# Patient Record
Sex: Female | Born: 1955 | Race: White | Hispanic: No | Marital: Single | Smoking: Never smoker
Health system: Southern US, Community
[De-identification: ages and names within clinical notes are randomized; demographics above are authoritative.]

## PROBLEM LIST (undated history)

## (undated) DIAGNOSIS — J45909 Unspecified asthma, uncomplicated: Secondary | ICD-10-CM

## (undated) DIAGNOSIS — F32A Depression, unspecified: Secondary | ICD-10-CM

## (undated) DIAGNOSIS — G43909 Migraine, unspecified, not intractable, without status migrainosus: Secondary | ICD-10-CM

## (undated) DIAGNOSIS — E785 Hyperlipidemia, unspecified: Secondary | ICD-10-CM

## (undated) DIAGNOSIS — N200 Calculus of kidney: Secondary | ICD-10-CM

## (undated) DIAGNOSIS — E119 Type 2 diabetes mellitus without complications: Secondary | ICD-10-CM

## (undated) DIAGNOSIS — T7840XA Allergy, unspecified, initial encounter: Secondary | ICD-10-CM

## (undated) DIAGNOSIS — F329 Major depressive disorder, single episode, unspecified: Secondary | ICD-10-CM

## (undated) HISTORY — DX: Type 2 diabetes mellitus without complications: E11.9

## (undated) HISTORY — DX: Migraine, unspecified, not intractable, without status migrainosus: G43.909

## (undated) HISTORY — PX: ABDOMINAL HYSTERECTOMY: SHX81

## (undated) HISTORY — DX: Unspecified asthma, uncomplicated: J45.909

## (undated) HISTORY — DX: Allergy, unspecified, initial encounter: T78.40XA

## (undated) HISTORY — DX: Hyperlipidemia, unspecified: E78.5

## (undated) HISTORY — PX: OOPHORECTOMY: SHX86

## (undated) HISTORY — PX: CHOLECYSTECTOMY: SHX55

## (undated) HISTORY — DX: Major depressive disorder, single episode, unspecified: F32.9

## (undated) HISTORY — DX: Depression, unspecified: F32.A

---

## 1979-04-23 HISTORY — PX: BREAST CYST ASPIRATION: SHX578

## 1982-04-22 HISTORY — PX: BREAST CYST ASPIRATION: SHX578

## 2000-04-11 ENCOUNTER — Encounter: Payer: Self-pay | Admitting: Cardiology

## 2000-04-11 ENCOUNTER — Ambulatory Visit (HOSPITAL_COMMUNITY): Admission: RE | Admit: 2000-04-11 | Discharge: 2000-04-11 | Payer: Self-pay | Admitting: Cardiology

## 2000-07-02 ENCOUNTER — Encounter: Payer: Self-pay | Admitting: Cardiology

## 2000-07-02 ENCOUNTER — Ambulatory Visit (HOSPITAL_COMMUNITY): Admission: RE | Admit: 2000-07-02 | Discharge: 2000-07-02 | Payer: Self-pay | Admitting: Cardiology

## 2000-07-04 ENCOUNTER — Ambulatory Visit (HOSPITAL_COMMUNITY): Admission: RE | Admit: 2000-07-04 | Discharge: 2000-07-04 | Payer: Self-pay | Admitting: Gastroenterology

## 2000-07-04 ENCOUNTER — Encounter: Payer: Self-pay | Admitting: Gastroenterology

## 2000-07-10 ENCOUNTER — Encounter: Payer: Self-pay | Admitting: General Surgery

## 2000-07-11 ENCOUNTER — Ambulatory Visit (HOSPITAL_COMMUNITY): Admission: RE | Admit: 2000-07-11 | Discharge: 2000-07-12 | Payer: Self-pay | Admitting: General Surgery

## 2001-01-21 ENCOUNTER — Ambulatory Visit (HOSPITAL_COMMUNITY): Admission: RE | Admit: 2001-01-21 | Discharge: 2001-01-21 | Payer: Self-pay | Admitting: Cardiology

## 2001-01-21 ENCOUNTER — Encounter: Payer: Self-pay | Admitting: Cardiology

## 2001-01-26 ENCOUNTER — Encounter: Payer: Self-pay | Admitting: *Deleted

## 2001-01-26 ENCOUNTER — Encounter: Admission: RE | Admit: 2001-01-26 | Discharge: 2001-01-26 | Payer: Self-pay | Admitting: *Deleted

## 2001-02-26 ENCOUNTER — Encounter: Admission: RE | Admit: 2001-02-26 | Discharge: 2001-02-26 | Payer: Self-pay | Admitting: *Deleted

## 2001-02-26 ENCOUNTER — Encounter: Payer: Self-pay | Admitting: *Deleted

## 2004-10-17 ENCOUNTER — Inpatient Hospital Stay: Payer: Self-pay | Admitting: Internal Medicine

## 2005-02-01 ENCOUNTER — Ambulatory Visit: Payer: Self-pay | Admitting: General Surgery

## 2005-02-13 ENCOUNTER — Ambulatory Visit: Payer: Self-pay | Admitting: General Surgery

## 2005-04-21 ENCOUNTER — Emergency Department: Payer: Self-pay | Admitting: Emergency Medicine

## 2005-10-09 ENCOUNTER — Emergency Department: Payer: Self-pay | Admitting: Emergency Medicine

## 2007-07-17 ENCOUNTER — Emergency Department: Payer: Self-pay | Admitting: Emergency Medicine

## 2009-06-06 ENCOUNTER — Ambulatory Visit: Payer: Self-pay | Admitting: Family Medicine

## 2011-04-20 ENCOUNTER — Emergency Department: Payer: Self-pay | Admitting: Emergency Medicine

## 2011-07-25 ENCOUNTER — Ambulatory Visit: Payer: Self-pay | Admitting: Family Medicine

## 2013-01-16 ENCOUNTER — Emergency Department: Payer: Self-pay | Admitting: Emergency Medicine

## 2013-07-27 ENCOUNTER — Ambulatory Visit: Payer: Self-pay | Admitting: Family Medicine

## 2013-08-30 ENCOUNTER — Emergency Department: Payer: Self-pay | Admitting: Emergency Medicine

## 2013-08-30 LAB — BASIC METABOLIC PANEL
Anion Gap: 3 — ABNORMAL LOW (ref 7–16)
BUN: 12 mg/dL (ref 7–18)
Calcium, Total: 9.1 mg/dL (ref 8.5–10.1)
Chloride: 103 mmol/L (ref 98–107)
Co2: 32 mmol/L (ref 21–32)
Creatinine: 0.64 mg/dL (ref 0.60–1.30)
EGFR (African American): >60
EGFR (Non-African Amer.): >60
Glucose: 113 mg/dL — ABNORMAL HIGH (ref 65–99)
Osmolality: 276 (ref 275–301)
POTASSIUM: 3.6 mmol/L (ref 3.5–5.1)
Sodium: 138 mmol/L (ref 136–145)

## 2013-08-30 LAB — CBC
HCT: 40.8 % (ref 35.0–47.0)
HGB: 13.4 g/dL (ref 12.0–16.0)
MCH: 30.3 pg (ref 26.0–34.0)
MCHC: 32.9 g/dL (ref 32.0–36.0)
MCV: 92 fL (ref 80–100)
Platelet: 238 10*3/uL (ref 150–440)
RBC: 4.43 10*6/uL (ref 3.80–5.20)
RDW: 12.9 % (ref 11.5–14.5)
WBC: 8.2 10*3/uL (ref 3.6–11.0)

## 2013-08-30 LAB — TROPONIN I

## 2013-10-25 ENCOUNTER — Ambulatory Visit: Payer: Self-pay | Admitting: Family Medicine

## 2014-06-21 HISTORY — PX: COLONOSCOPY: SHX174

## 2014-07-04 DIAGNOSIS — R1011 Right upper quadrant pain: Secondary | ICD-10-CM | POA: Insufficient documentation

## 2014-07-06 ENCOUNTER — Ambulatory Visit: Payer: Self-pay | Admitting: Unknown Physician Specialty

## 2014-07-25 ENCOUNTER — Ambulatory Visit
Admit: 2014-07-25 | Disposition: A | Discharge status: Home / Self Care | Payer: Self-pay | Attending: Unknown Physician Specialty | Admitting: Unknown Physician Specialty

## 2014-07-25 LAB — HM COLONOSCOPY

## 2014-08-15 LAB — SURGICAL PATHOLOGY

## 2014-08-31 DIAGNOSIS — K760 Fatty (change of) liver, not elsewhere classified: Secondary | ICD-10-CM | POA: Insufficient documentation

## 2014-10-19 ENCOUNTER — Other Ambulatory Visit: Payer: Self-pay | Admitting: Family Medicine

## 2014-10-20 ENCOUNTER — Ambulatory Visit (INDEPENDENT_AMBULATORY_CARE_PROVIDER_SITE_OTHER): Payer: Managed Care, Other (non HMO) | Admitting: Family Medicine

## 2014-10-20 ENCOUNTER — Encounter: Payer: Self-pay | Admitting: Family Medicine

## 2014-10-20 VITALS — BP 137/74 | HR 60 | Temp 98.0°F | Ht <= 58 in | Wt 192.0 lb

## 2014-10-20 DIAGNOSIS — Z6841 Body Mass Index (BMI) 40.0 and over, adult: Secondary | ICD-10-CM | POA: Insufficient documentation

## 2014-10-20 DIAGNOSIS — M79605 Pain in left leg: Secondary | ICD-10-CM | POA: Insufficient documentation

## 2014-10-20 DIAGNOSIS — E1169 Type 2 diabetes mellitus with other specified complication: Secondary | ICD-10-CM | POA: Insufficient documentation

## 2014-10-20 DIAGNOSIS — F32A Depression, unspecified: Secondary | ICD-10-CM

## 2014-10-20 DIAGNOSIS — M79604 Pain in right leg: Secondary | ICD-10-CM | POA: Diagnosis not present

## 2014-10-20 DIAGNOSIS — I10 Essential (primary) hypertension: Secondary | ICD-10-CM | POA: Insufficient documentation

## 2014-10-20 DIAGNOSIS — F329 Major depressive disorder, single episode, unspecified: Secondary | ICD-10-CM | POA: Diagnosis not present

## 2014-10-20 DIAGNOSIS — E119 Type 2 diabetes mellitus without complications: Secondary | ICD-10-CM

## 2014-10-20 DIAGNOSIS — K900 Celiac disease: Secondary | ICD-10-CM

## 2014-10-20 DIAGNOSIS — F339 Major depressive disorder, recurrent, unspecified: Secondary | ICD-10-CM | POA: Insufficient documentation

## 2014-10-20 LAB — BAYER DCA HB A1C WAIVED: HB A1C: 5.8 % (ref <7.0)

## 2014-10-20 MED ORDER — CYCLOBENZAPRINE HCL 10 MG PO TABS: 10.0000 mg | ORAL_TABLET | Freq: Every day | ORAL | Status: DC

## 2014-10-20 MED ORDER — LIRAGLUTIDE -WEIGHT MANAGEMENT 18 MG/3ML ~~LOC~~ SOPN
0.6000 | PEN_INJECTOR | Freq: Every day | SUBCUTANEOUS | Status: DC
Start: 2014-10-20 — End: 2015-04-13

## 2014-10-20 MED ORDER — BUPROPION HCL ER (SR) 150 MG PO TB12: 150.0000 mg | ORAL_TABLET | Freq: Three times a day (TID) | ORAL | Status: DC

## 2014-10-20 MED ORDER — DIAZEPAM 5 MG PO TABS: 5.0000 mg | ORAL_TABLET | Freq: Every day | ORAL | Status: DC | PRN

## 2014-10-20 MED ORDER — OMEPRAZOLE 20 MG PO CPDR: 20.0000 mg | DELAYED_RELEASE_CAPSULE | Freq: Every day | ORAL | Status: DC

## 2014-10-20 MED ORDER — BUDESONIDE-FORMOTEROL FUMARATE 160-4.5 MCG/ACT IN AERO: 2.0000 | INHALATION_SPRAY | Freq: Two times a day (BID) | RESPIRATORY_TRACT | Status: DC

## 2014-10-20 MED ORDER — HYDROCHLOROTHIAZIDE 25 MG PO TABS: 25.0000 mg | ORAL_TABLET | Freq: Every day | ORAL | Status: DC

## 2014-10-20 MED ORDER — GABAPENTIN 300 MG PO CAPS: 300.0000 mg | ORAL_CAPSULE | Freq: Two times a day (BID) | ORAL | Status: DC

## 2014-10-20 MED ORDER — MIRABEGRON ER 25 MG PO TB24: 25.0000 mg | ORAL_TABLET | Freq: Every day | ORAL | Status: DC

## 2014-10-20 NOTE — Progress Notes (Signed)
BP 137/74 mmHg  Pulse 60  Temp(Src) 98 F (36.7 C)  Ht 4\' 10"  (1.473 m)  Wt 192 lb (87.091 kg)  BMI 40.14 kg/m2  SpO2 99%   Subjective:    Patient ID: [name], female    DOB: 07/27/1955, 59 y.o.   MRN: [id number]  HPI: [name] is a 59 y.o. female  Chief Complaint  Patient presents with  . Diabetes  htn  Doing well Was dx with celiac and doing OK but not gaining wt meds stable takes every day long term No side effects Still chronic leg pain wants to try something will try gabapentin Still a lot of Dad issues and takes 1/2 valium PRN   Relevant past medical, surgical, family and social history reviewed and updated as indicated. Interim medical history since our last visit reviewed. Allergies and medications reviewed and updated.  Review of Systems  Constitutional: Negative.   Respiratory: Negative.   Cardiovascular: Negative.     Per HPI unless specifically indicated above     Objective:    BP 137/74 mmHg  Pulse 60  Temp(Src) 98 F (36.7 C)  Ht 4\' 10"  (1.473 m)  Wt 192 lb (87.091 kg)  BMI 40.14 kg/m2  SpO2 99%  Wt Readings from Last 3 Encounters:  10/20/14 192 lb (87.091 kg)  06/16/14 193 lb (87.544 kg)    Physical Exam  Constitutional: She is oriented to person, place, and time. She appears well-developed and well-nourished. No distress.  HENT:  Head: Normocephalic and atraumatic.  Right Ear: Hearing normal.  Left Ear: Hearing normal.  Nose: Nose normal.  Eyes: Conjunctivae and lids are normal. Right eye exhibits no discharge. Left eye exhibits no discharge. No scleral icterus.  Cardiovascular: Normal rate, regular rhythm and normal heart sounds.   Pulmonary/Chest: Effort normal and breath sounds normal. No respiratory distress.  Musculoskeletal: Normal range of motion.  Neurological: She is alert and oriented to person, place, and time.  Skin: Skin is intact. No rash noted.  Psychiatric: She has a normal mood and  affect. Her speech is normal and behavior is normal. Judgment and thought content normal. Cognition and memory are normal.        Assessment & Plan:   Problem List Items Addressed This Visit      Cardiovascular and Mediastinum   Hypertension - Primary    The current medical regimen is effective;  continue present plan and medications.       Relevant Medications   hydrochlorothiazide (HYDRODIURIL) 25 MG tablet   Other Relevant Orders   Basic metabolic panel     Digestive   Celiac sprue    On diet and followed by GI      Relevant Medications   omeprazole (PRILOSEC) 20 MG capsule     Endocrine   Diabetes mellitus without complication   Relevant Orders   Bayer DCA Hb A1c Waived     Other   Depression    Cont PRN valium      Relevant Medications   buPROPion (WELLBUTRIN SR) 150 MG 12 hr tablet   diazepam (VALIUM) 5 MG tablet   BMI 40.0-44.9, adult    Discuss wt loss diet exercise Will try saxenda again had constipation last time but no glutin free      Relevant Medications   Liraglutide -Weight Management (SAXENDA) 18 MG/3ML SOPN   Leg pain, bilateral    Will try gabapentin      Relevant Medications   gabapentin (NEURONTIN)  300 MG capsule       Follow up plan: Return in about 6 months (around 04/21/2015) for Physical Exam.

## 2014-10-20 NOTE — Assessment & Plan Note (Signed)
Will try gabapentin 

## 2014-10-20 NOTE — Assessment & Plan Note (Signed)
Cont PRN valium

## 2014-10-20 NOTE — Assessment & Plan Note (Signed)
On diet and followed by GI

## 2014-10-20 NOTE — Assessment & Plan Note (Signed)
Discuss wt loss diet exercise Will try saxenda again had constipation last time but no glutin free

## 2014-10-20 NOTE — Assessment & Plan Note (Signed)
The current medical regimen is effective;  continue present plan and medications.  

## 2014-10-21 LAB — BASIC METABOLIC PANEL
BUN/Creatinine Ratio: 28 — ABNORMAL HIGH (ref 9–23)
BUN: 17 mg/dL (ref 6–24)
CALCIUM: 9.5 mg/dL (ref 8.7–10.2)
CHLORIDE: 100 mmol/L (ref 97–108)
CO2: 25 mmol/L (ref 18–29)
Creatinine, Ser: 0.61 mg/dL (ref 0.57–1.00)
GFR, EST AFRICAN AMERICAN: 115 mL/min/{1.73_m2} (ref >59)
GFR, EST NON AFRICAN AMERICAN: 100 mL/min/{1.73_m2} (ref >59)
Glucose: 105 mg/dL — ABNORMAL HIGH (ref 65–99)
Potassium: 4.2 mmol/L (ref 3.5–5.2)
SODIUM: 142 mmol/L (ref 134–144)

## 2014-11-01 ENCOUNTER — Telehealth: Payer: Self-pay

## 2014-11-01 NOTE — Telephone Encounter (Signed)
Patient states her legs are still hurting and the Gabapentin makes her too drowsy.  She would like to see if there is something different she can take.  Uses [facility]

## 2014-11-02 ENCOUNTER — Telehealth: Payer: Self-pay | Admitting: Family Medicine

## 2014-11-02 MED ORDER — PREGABALIN 50 MG PO CAPS: 50.0000 mg | ORAL_CAPSULE | Freq: Three times a day (TID) | ORAL | Status: DC

## 2014-11-02 NOTE — Telephone Encounter (Signed)
Pt called wanted to be transferred to vm for nancy and i told her that we couldn't do that anymore. She wouldn't tell me what she needed or anything all she just asked that you call her back.

## 2014-11-03 NOTE — Telephone Encounter (Signed)
Rx was written for Lyrica but mistakenly printed, has now been called into [facility]

## 2014-11-25 ENCOUNTER — Other Ambulatory Visit: Payer: Self-pay | Admitting: Family Medicine

## 2014-11-25 DIAGNOSIS — Z1231 Encounter for screening mammogram for malignant neoplasm of breast: Secondary | ICD-10-CM

## 2014-11-29 ENCOUNTER — Ambulatory Visit
Admission: RE | Admit: 2014-11-29 | Discharge: 2014-11-29 | Disposition: A | Discharge status: Home / Self Care | Payer: Managed Care, Other (non HMO) | Source: Ambulatory Visit | Attending: Family Medicine | Admitting: Family Medicine

## 2014-11-29 DIAGNOSIS — Z1231 Encounter for screening mammogram for malignant neoplasm of breast: Secondary | ICD-10-CM | POA: Insufficient documentation

## 2014-12-15 ENCOUNTER — Telehealth: Payer: Self-pay | Admitting: Family Medicine

## 2014-12-15 MED ORDER — CIPROFLOXACIN HCL 250 MG PO TABS: 250.0000 mg | ORAL_TABLET | Freq: Two times a day (BID) | ORAL | Status: DC

## 2014-12-15 NOTE — Telephone Encounter (Signed)
Pt called stated she thinks she may have a uti or bladder infection wants to know if something can be called in for her. Pharm is [facility] in [place]. Thanks.

## 2014-12-15 NOTE — Telephone Encounter (Signed)
Phone call Patient with UTI will call in medications will need office visit if not better

## 2015-02-23 ENCOUNTER — Encounter: Payer: Self-pay | Admitting: Emergency Medicine

## 2015-02-23 ENCOUNTER — Emergency Department
Admission: EM | Admit: 2015-02-23 | Discharge: 2015-02-23 | Disposition: A | Discharge status: Home / Self Care | Payer: Managed Care, Other (non HMO) | Attending: Emergency Medicine | Admitting: Emergency Medicine

## 2015-02-23 ENCOUNTER — Emergency Department: Payer: Managed Care, Other (non HMO)

## 2015-02-23 DIAGNOSIS — Z880 Allergy status to penicillin: Secondary | ICD-10-CM | POA: Insufficient documentation

## 2015-02-23 DIAGNOSIS — Z792 Long term (current) use of antibiotics: Secondary | ICD-10-CM | POA: Insufficient documentation

## 2015-02-23 DIAGNOSIS — Z79899 Other long term (current) drug therapy: Secondary | ICD-10-CM | POA: Diagnosis not present

## 2015-02-23 DIAGNOSIS — N390 Urinary tract infection, site not specified: Secondary | ICD-10-CM

## 2015-02-23 DIAGNOSIS — I10 Essential (primary) hypertension: Secondary | ICD-10-CM | POA: Insufficient documentation

## 2015-02-23 DIAGNOSIS — E119 Type 2 diabetes mellitus without complications: Secondary | ICD-10-CM | POA: Insufficient documentation

## 2015-02-23 DIAGNOSIS — Z7951 Long term (current) use of inhaled steroids: Secondary | ICD-10-CM | POA: Insufficient documentation

## 2015-02-23 DIAGNOSIS — R109 Unspecified abdominal pain: Secondary | ICD-10-CM

## 2015-02-23 DIAGNOSIS — R1032 Left lower quadrant pain: Secondary | ICD-10-CM | POA: Diagnosis present

## 2015-02-23 HISTORY — DX: Calculus of kidney: N20.0

## 2015-02-23 LAB — URINALYSIS COMPLETE WITH MICROSCOPIC (ARMC ONLY)
BACTERIA UA: NONE SEEN
Bilirubin Urine: NEGATIVE
GLUCOSE, UA: NEGATIVE mg/dL
KETONES UR: NEGATIVE mg/dL
NITRITE: NEGATIVE
PROTEIN: 30 mg/dL — AB
SPECIFIC GRAVITY, URINE: 1.019 (ref 1.005–1.030)
pH: 5 (ref 5.0–8.0)

## 2015-02-23 LAB — COMPREHENSIVE METABOLIC PANEL
ALT: 21 U/L (ref 14–54)
AST: 19 U/L (ref 15–41)
Albumin: 4.4 g/dL (ref 3.5–5.0)
Alkaline Phosphatase: 61 U/L (ref 38–126)
Anion gap: 8 (ref 5–15)
BUN: 14 mg/dL (ref 6–20)
CHLORIDE: 103 mmol/L (ref 101–111)
CO2: 28 mmol/L (ref 22–32)
CREATININE: 0.75 mg/dL (ref 0.44–1.00)
Calcium: 9.3 mg/dL (ref 8.9–10.3)
GFR calc non Af Amer: >60 mL/min (ref >60)
Glucose, Bld: 126 mg/dL — ABNORMAL HIGH (ref 65–99)
POTASSIUM: 3.8 mmol/L (ref 3.5–5.1)
SODIUM: 139 mmol/L (ref 135–145)
Total Bilirubin: 0.8 mg/dL (ref 0.3–1.2)
Total Protein: 7.6 g/dL (ref 6.5–8.1)

## 2015-02-23 LAB — CBC
HEMATOCRIT: 40.7 % (ref 35.0–47.0)
Hemoglobin: 13.7 g/dL (ref 12.0–16.0)
MCH: 30.8 pg (ref 26.0–34.0)
MCHC: 33.8 g/dL (ref 32.0–36.0)
MCV: 91.3 fL (ref 80.0–100.0)
PLATELETS: 251 10*3/uL (ref 150–440)
RBC: 4.45 MIL/uL (ref 3.80–5.20)
RDW: 13 % (ref 11.5–14.5)
WBC: 12.3 10*3/uL — AB (ref 3.6–11.0)

## 2015-02-23 LAB — LIPASE, BLOOD: LIPASE: 32 U/L (ref 11–51)

## 2015-02-23 MED ORDER — CEPHALEXIN 500 MG PO CAPS
ORAL_CAPSULE | ORAL | Status: DC
Start: 2015-02-23 — End: 2015-02-24
  Filled 2015-02-23: qty 1

## 2015-02-23 MED ORDER — OXYCODONE-ACETAMINOPHEN 5-325 MG PO TABS: 1.0000 | ORAL_TABLET | Freq: Four times a day (QID) | ORAL | Status: DC | PRN

## 2015-02-23 MED ORDER — CEPHALEXIN 500 MG PO CAPS: 500.0000 mg | ORAL_CAPSULE | Freq: Three times a day (TID) | ORAL | Status: DC

## 2015-02-23 MED ORDER — KETOROLAC TROMETHAMINE 60 MG/2ML IM SOLN
INTRAMUSCULAR | Status: AC
  Filled 2015-02-23: qty 2

## 2015-02-23 MED ORDER — KETOROLAC TROMETHAMINE 30 MG/ML IJ SOLN
30.0000 mg | Freq: Once | INTRAMUSCULAR | Status: AC
  Administered 2015-02-23: 30 mg via INTRAVENOUS

## 2015-02-23 MED ORDER — ONDANSETRON HCL 4 MG/2ML IJ SOLN
4.0000 mg | Freq: Once | INTRAMUSCULAR | Status: AC
  Administered 2015-02-23: 4 mg via INTRAVENOUS
  Filled 2015-02-23: qty 2

## 2015-02-23 MED ORDER — SODIUM CHLORIDE 0.9 % IV BOLUS (SEPSIS)
1000.0000 mL | Freq: Once | INTRAVENOUS | Status: AC
  Administered 2015-02-23: 1000 mL via INTRAVENOUS

## 2015-02-23 MED ORDER — CEPHALEXIN 500 MG PO CAPS
500.0000 mg | ORAL_CAPSULE | Freq: Once | ORAL | Status: AC
  Administered 2015-02-23: 500 mg via ORAL

## 2015-02-23 MED ORDER — MORPHINE SULFATE (PF) 4 MG/ML IV SOLN
4.0000 mg | Freq: Once | INTRAVENOUS | Status: AC
  Administered 2015-02-23: 4 mg via INTRAVENOUS
  Filled 2015-02-23: qty 1

## 2015-02-23 NOTE — Discharge Instructions (Signed)
Abdominal Pain, Adult °Many things can cause abdominal pain. Usually, abdominal pain is not caused by a disease and will improve without treatment. It can often be observed and treated at home. Your health care provider will do a physical exam and possibly order blood tests and X-rays to help determine the seriousness of your pain. However, in many cases, more time must pass before a clear cause of the pain can be found. Before that point, your health care provider may not know if you need more testing or further treatment. °HOME CARE INSTRUCTIONS °Monitor your abdominal pain for any changes. The following actions may help to alleviate any discomfort you are experiencing: °· Only take over-the-counter or prescription medicines as directed by your health care provider. °· Do not take laxatives unless directed to do so by your health care provider. °· Try a clear liquid diet (broth, tea, or water) as directed by your health care provider. Slowly move to a bland diet as tolerated. °SEEK MEDICAL CARE IF: °· You have unexplained abdominal pain. °· You have abdominal pain associated with nausea or diarrhea. °· You have pain when you urinate or have a bowel movement. °· You experience abdominal pain that wakes you in the night. °· You have abdominal pain that is worsened or improved by eating food. °· You have abdominal pain that is worsened with eating fatty foods. °· You have a fever. °SEEK IMMEDIATE MEDICAL CARE IF: °· Your pain does not go away within 2 hours. °· You keep throwing up (vomiting). °· Your pain is felt only in portions of the abdomen, such as the right side or the left lower portion of the abdomen. °· You pass bloody or black tarry stools. °MAKE SURE YOU: °· Understand these instructions. °· Will watch your condition. °· Will get help right away if you are not doing well or get worse. °  °This information is not intended to replace advice given to you by your health care provider. Make sure you discuss  any questions you have with your health care provider. °  °Document Released: 01/16/2005 Document Revised: 12/28/2014 Document Reviewed: 12/16/2012 °Elsevier Interactive Patient Education ©2016 [employer] ° °Urinary Tract Infection °A urinary tract infection (UTI) can occur any place along the urinary tract. The tract includes the kidneys, ureters, bladder, and urethra. A type of germ called bacteria often causes a UTI. UTIs are often helped with antibiotic medicine.  °HOME CARE  °· If given, take antibiotics as told by your doctor. Finish them even if you start to feel better. °· Drink enough fluids to keep your pee (urine) clear or pale yellow. °· Avoid tea, drinks with caffeine, and bubbly (carbonated) drinks. °· Pee often. Avoid holding your pee in for a long time. °· Pee before and after having sex (intercourse). °· Wipe from front to back after you poop (bowel movement) if you are a woman. Use each tissue only once. °GET HELP RIGHT AWAY IF:  °· You have back pain. °· You have lower belly (abdominal) pain. °· You have chills. °· You feel sick to your stomach (nauseous). °· You throw up (vomit). °· Your burning or discomfort with peeing does not go away. °· You have a fever. °· Your symptoms are not better in 3 days. °MAKE SURE YOU:  °· Understand these instructions. °· Will watch your condition. °· Will get help right away if you are not doing well or get worse. °  °This information is not intended to replace advice given   to you by your health care provider. Make sure you discuss any questions you have with your health care provider. °  °Document Released: 09/25/2007 Document Revised: 04/29/2014 Document Reviewed: 11/07/2011 °Elsevier Interactive Patient Education ©2016 [employer] ° °

## 2015-02-23 NOTE — ED Notes (Signed)
Right lower back pain onset 1530.  Pain moved toward right lower quadrant.  Patient also c/o hematuria and urinary urgency since 1530.

## 2015-02-23 NOTE — ED Provider Notes (Signed)
[facility] Emergency Department Provider Note  Time seen: 8:45 PM  I have reviewed the triage vital signs and the nursing notes.   HISTORY  Chief Complaint Hematuria and Flank Pain    HPI [name] is a 59 y.o. female with a past medical history of hypertension, hyperlipidemia, depression, diabetes, kidney stones who presents to the emergency department with left flank pain. According to the patient she developed left flank pain approximately 5-6 hours ago. States it is severe 10/10 radiating down to her left groin. States she has developed bloody urine as well. Denies dysuria. Denies fever. States one episode of diarrhea, nausea and one episode of vomiting. Describes the pain as 10/10 currently, sharp pain located in the left flank to left lower quadrant.     Past Medical History  Diagnosis Date  . Hyperlipidemia   . Depression   . Diabetes mellitus without complication (HCC)   . Asthma   . Migraine headache   . Kidney stones     Patient Active Problem List   Diagnosis Date Noted  . Hypertension 10/20/2014  . Diabetes mellitus without complication (HCC) 10/20/2014  . Celiac sprue 10/20/2014  . Depression 10/20/2014  . BMI 40.0-44.9, adult (HCC) 10/20/2014  . Leg pain, bilateral 10/20/2014    Past Surgical History  Procedure Laterality Date  . Abdominal hysterectomy    . Cholecystectomy    . Colonoscopy  March 2016  . Breast cyst aspiration Right     benign  . Breast cyst aspiration Right     benign    Current Outpatient Rx  Name  Route  Sig  Dispense  Refill  . albuterol (PROVENTIL HFA;VENTOLIN HFA) 108 (90 BASE) MCG/ACT inhaler   Inhalation   Inhale 2 puffs into the lungs every 6 (six) hours as needed for wheezing or shortness of breath.         . budesonide-formoterol (SYMBICORT) 160-4.5 MCG/ACT inhaler   Inhalation   Inhale 2 puffs into the lungs 2 (two) times daily.   1 Inhaler   7   . buPROPion (WELLBUTRIN SR)  150 MG 12 hr tablet   Oral   Take 1 tablet (150 mg total) by mouth 3 (three) times daily.   90 tablet   7   . ciprofloxacin (CIPRO) 250 MG tablet   Oral   Take 1 tablet (250 mg total) by mouth 2 (two) times daily.   6 tablet   0   . cyclobenzaprine (FLEXERIL) 10 MG tablet   Oral   Take 1 tablet (10 mg total) by mouth at bedtime.   30 tablet   3   . diazepam (VALIUM) 5 MG tablet   Oral   Take 1 tablet (5 mg total) by mouth daily as needed for anxiety.   30 tablet   3   . hydrochlorothiazide (HYDRODIURIL) 25 MG tablet   Oral   Take 1 tablet (25 mg total) by mouth daily.   30 tablet   7   . Liraglutide -Weight Management (SAXENDA) 18 MG/3ML SOPN   Subcutaneous   Inject 0.6 application into the skin daily. Start .6 a day for 1 week may increase by .6 q week max 3   3 pen   7   . mirabegron ER (MYRBETRIQ) 25 MG TB24 tablet   Oral   Take 1 tablet (25 mg total) by mouth daily.   30 tablet   6     FOR NEXT FILL. THANK YOU   .  omeprazole (PRILOSEC) 20 MG capsule   Oral   Take 1 capsule (20 mg total) by mouth daily.   30 capsule   12   . pregabalin (LYRICA) 50 MG capsule   Oral   Take 1 capsule (50 mg total) by mouth 3 (three) times daily. If needed may increase to  tid after 1 week   90 capsule   1     Allergies Amoxicillin  Family History  Problem Relation Age of Onset  . Hypertension Mother   . Cancer Mother     breast  . Arthritis Mother   . Alzheimer's disease Mother   . Parkinson's disease Mother   . Breast cancer Mother 59  . Hypertension Father   . Diabetes Father   . Breast cancer Maternal Aunt   . Breast cancer Maternal Grandmother 35  . Breast cancer Cousin 16    Social History Social History  Substance Use Topics  . Smoking status: Never Smoker   . Smokeless tobacco: Never Used  . Alcohol Use: Yes    Review of Systems Constitutional: Negative for fever Cardiovascular: Negative for chest pain. Respiratory: Negative for  shortness of breath. Gastrointestinal: Left flank pain, nausea and vomiting. Genitourinary: Negative for dysuria. Positive for hematuria Musculoskeletal: Positive left back pain. 10-point ROS otherwise negative.  ____________________________________________   PHYSICAL EXAM:  VITAL SIGNS: ED Triage Vitals  Enc Vitals Group     BP 02/23/15 1932 171/78 mmHg     Pulse Rate 02/23/15 1932 71     Resp 02/23/15 1932 16     Temp 02/23/15 1932 97.7 F (36.5 C)     Temp Source 02/23/15 1932 Oral     SpO2 02/23/15 1932 96 %     Weight 02/23/15 1930 180 lb (81.647 kg)     Height 02/23/15 1930 5' (1.524 m)     Head Cir --      Peak Flow --      Pain Score 02/23/15 1931 10     Pain Loc --      Pain Edu? --      Excl. in GC? --    Constitutional: Alert and oriented. Well appearing and in no distress. Eyes: Normal exam ENT   Head: Normocephalic and atraumatic.   Mouth/Throat: Mucous membranes are moist. Cardiovascular: Normal rate, regular rhythm. No murmurs, rubs, or gallops. Respiratory: Normal respiratory effort without tachypnea nor retractions. Breath sounds are clear and equal bilaterally. No wheezes/rales/rhonchi. Gastrointestinal: Mild left lower quadrant tenderness palpation. No distention, no rebound or guarding. Musculoskeletal: Nontender with normal range of motion in all extremities.  Neurologic:  Normal speech and language. No gross focal neurologic deficits  Skin:  Skin is warm, dry and intact.  Psychiatric: Mood and affect are normal. Speech and behavior are normal.   ____________________________________________    RADIOLOGY  CT shows no calculi.  ____________________________________________    INITIAL IMPRESSION / ASSESSMENT AND PLAN / ED COURSE  Pertinent labs & imaging results that were available during my care of the patient were reviewed by me and considered in my medical decision making (see chart for details).  Patient presents the emergency  department with 6 hours of left flank pain. History of kidney stones in the past which this feels similar. We will check labs, CT renal scan of the left flank, and treat discomfort while awaiting results.  CT shows no renal stones. Given red and white blood cells in her urine, we will treat with an antibiotic for her likely  urinary tract infection. We'll also discharge her short course of pain medication. Patient is follow up with the primary care physician 1-2 days for recheck. I discussed strict return precautions for any worsening pain, fever, patient agreeable.  ____________________________________________   FINAL CLINICAL IMPRESSION(S) / ED DIAGNOSES  Left flank pain Urinary tract infection  Minna Antis, MD 02/23/15 2222

## 2015-02-25 LAB — URINE CULTURE

## 2015-03-29 ENCOUNTER — Other Ambulatory Visit
Admission: RE | Admit: 2015-03-29 | Discharge: 2015-03-29 | Disposition: A | Discharge status: Home / Self Care | Payer: Managed Care, Other (non HMO) | Source: Skilled Nursing Facility | Attending: Physician Assistant | Admitting: Physician Assistant

## 2015-03-29 DIAGNOSIS — R197 Diarrhea, unspecified: Secondary | ICD-10-CM | POA: Insufficient documentation

## 2015-03-29 LAB — C DIFFICILE QUICK SCREEN W PCR REFLEX
C DIFFICLE (CDIFF) ANTIGEN: NEGATIVE
C Diff interpretation: NEGATIVE
C Diff toxin: NEGATIVE

## 2015-04-02 LAB — STOOL CULTURE

## 2015-04-04 LAB — O&P RESULT

## 2015-04-04 LAB — GIARDIA, EIA; OVA/PARASITE: Giardia Ag, Stl: NEGATIVE

## 2015-04-04 LAB — PANCREATIC ELASTASE, FECAL: Pancreatic Elastase-1, Stool: >500 ug Elast./g (ref >200)

## 2015-04-13 ENCOUNTER — Ambulatory Visit (INDEPENDENT_AMBULATORY_CARE_PROVIDER_SITE_OTHER): Payer: Managed Care, Other (non HMO) | Admitting: Family Medicine

## 2015-04-13 ENCOUNTER — Encounter: Payer: Self-pay | Admitting: Family Medicine

## 2015-04-13 VITALS — BP 117/75 | HR 62 | Temp 98.0°F | Ht 58.2 in | Wt 188.0 lb

## 2015-04-13 DIAGNOSIS — Z0000 Encounter for general adult medical examination without abnormal findings: Secondary | ICD-10-CM

## 2015-04-13 DIAGNOSIS — E119 Type 2 diabetes mellitus without complications: Secondary | ICD-10-CM | POA: Diagnosis not present

## 2015-04-13 DIAGNOSIS — K900 Celiac disease: Secondary | ICD-10-CM

## 2015-04-13 DIAGNOSIS — F32A Depression, unspecified: Secondary | ICD-10-CM

## 2015-04-13 DIAGNOSIS — I10 Essential (primary) hypertension: Secondary | ICD-10-CM

## 2015-04-13 DIAGNOSIS — F329 Major depressive disorder, single episode, unspecified: Secondary | ICD-10-CM | POA: Diagnosis not present

## 2015-04-13 LAB — MICROALBUMIN, URINE WAIVED
CREATININE, URINE WAIVED: 200 mg/dL (ref 10–300)
MICROALB, UR WAIVED: 80 mg/L — AB (ref 0–19)

## 2015-04-13 LAB — URINALYSIS, ROUTINE W REFLEX MICROSCOPIC
Bilirubin, UA: NEGATIVE
GLUCOSE, UA: NEGATIVE
KETONES UA: NEGATIVE
LEUKOCYTES UA: NEGATIVE
Nitrite, UA: NEGATIVE
PROTEIN UA: NEGATIVE
RBC, UA: NEGATIVE
Specific Gravity, UA: 1.015 (ref 1.005–1.030)
Urobilinogen, Ur: 0.2 mg/dL (ref 0.2–1.0)
pH, UA: 7.5 (ref 5.0–7.5)

## 2015-04-13 LAB — BAYER DCA HB A1C WAIVED: HB A1C: 5.8 % (ref <7.0)

## 2015-04-13 MED ORDER — MIRABEGRON ER 25 MG PO TB24: 25.0000 mg | ORAL_TABLET | Freq: Every day | ORAL | Status: DC

## 2015-04-13 MED ORDER — BUPROPION HCL ER (SR) 150 MG PO TB12: 150.0000 mg | ORAL_TABLET | Freq: Three times a day (TID) | ORAL | Status: DC

## 2015-04-13 MED ORDER — ALBUTEROL SULFATE HFA 108 (90 BASE) MCG/ACT IN AERS: 2.0000 | INHALATION_SPRAY | Freq: Four times a day (QID) | RESPIRATORY_TRACT | Status: DC | PRN

## 2015-04-13 MED ORDER — HYDROCHLOROTHIAZIDE 25 MG PO TABS: 25.0000 mg | ORAL_TABLET | Freq: Every day | ORAL | Status: DC

## 2015-04-13 MED ORDER — BUDESONIDE-FORMOTEROL FUMARATE 160-4.5 MCG/ACT IN AERO: 2.0000 | INHALATION_SPRAY | Freq: Two times a day (BID) | RESPIRATORY_TRACT | Status: DC

## 2015-04-13 MED ORDER — DIAZEPAM 5 MG PO TABS: 5.0000 mg | ORAL_TABLET | Freq: Every day | ORAL | Status: DC | PRN

## 2015-04-13 MED ORDER — OMEPRAZOLE 20 MG PO CPDR: 20.0000 mg | DELAYED_RELEASE_CAPSULE | Freq: Every day | ORAL | Status: DC

## 2015-04-13 NOTE — Assessment & Plan Note (Signed)
The current medical regimen is effective;  continue present plan and medications.  

## 2015-04-13 NOTE — Progress Notes (Signed)
BP 117/75 mmHg  Pulse 62  Temp(Src) 98 F (36.7 C)  Ht 4' 10.2" (1.478 m)  Wt 188 lb (85.276 kg)  BMI 39.04 kg/m2  SpO2 97%   Subjective:    Patient ID: [name], female    DOB: Nov 24, 1955, 59 y.o.   MRN: [id number]  HPI: [name] is a 59 y.o. female  Chief Complaint  Patient presents with  . Annual Exam   patient doing well all in all depression nerves stable with bupropion and occasional Valium use. Abdominal pains being controlled husband diagnoses irritable bowel Leg symptoms of stopped his modified exercise which is made a big difference Overactive bladder doing fair at best Breathing is stable. Relevant past medical, surgical, family and social history reviewed and updated as indicated. Interim medical history since our last visit reviewed. Allergies and medications reviewed and updated.  Review of Systems  Constitutional: Negative.   HENT: Negative.   Eyes: Negative.   Respiratory: Negative.   Cardiovascular: Negative.   Gastrointestinal: Negative.   Endocrine: Negative.   Genitourinary: Negative.   Musculoskeletal: Negative.   Skin: Negative.   Allergic/Immunologic: Negative.   Neurological: Negative.   Hematological: Negative.   Psychiatric/Behavioral: Negative.     Per HPI unless specifically indicated above     Objective:    BP 117/75 mmHg  Pulse 62  Temp(Src) 98 F (36.7 C)  Ht 4' 10.2" (1.478 m)  Wt 188 lb (85.276 kg)  BMI 39.04 kg/m2  SpO2 97%  Wt Readings from Last 3 Encounters:  04/13/15 188 lb (85.276 kg)  02/23/15 180 lb (81.647 kg)  10/20/14 192 lb (87.091 kg)    Physical Exam  Constitutional: She is oriented to person, place, and time. She appears well-developed and well-nourished.  HENT:  Head: Normocephalic and atraumatic.  Right Ear: External ear normal.  Left Ear: External ear normal.  Nose: Nose normal.  Mouth/Throat: Oropharynx is clear and moist.  Eyes: Conjunctivae and EOM are normal.  Pupils are equal, round, and reactive to light.  Neck: Normal range of motion. Neck supple. Carotid bruit is not present.  Cardiovascular: Normal rate, regular rhythm and normal heart sounds.   No murmur heard. Pulmonary/Chest: Effort normal and breath sounds normal. She exhibits no mass. Right breast exhibits no mass, no skin change and no tenderness. Left breast exhibits no mass, no skin change and no tenderness. Breasts are symmetrical.  Abdominal: Soft. Bowel sounds are normal. There is no hepatosplenomegaly.  Musculoskeletal: Normal range of motion.  Neurological: She is alert and oriented to person, place, and time.  Skin: No rash noted.  Psychiatric: She has a normal mood and affect. Her behavior is normal. Judgment and thought content normal.    Results for orders placed or performed during the hospital encounter of 03/29/15  C difficile quick scan w PCR reflex  Result Value Ref Range   C Diff antigen NEGATIVE NEGATIVE   C Diff toxin NEGATIVE NEGATIVE   C Diff interpretation Negative for C. difficile   Stool culture  Result Value Ref Range   Specimen Description STOOL    Special Requests NONE    Culture      NO SALMONELLA OR SHIGELLA ISOLATED No Pathogenic E. coli detected NO CAMPYLOBACTER DETECTED    Report Status 04/02/2015 FINAL   Giardia, EIA; Ova/Parasite  Result Value Ref Range   Ova + Parasite Exam Final report    Giardia Ag, Stl Negative Negative  Pancreatic elastase, fecal  Result Value Ref Range  Pancreatic Elastase-1, Stool >500 >200 ug Elast./g  O&P Result  Result Value Ref Range   Result 1 Comment       Assessment & Plan:   Problem List Items Addressed This Visit      Cardiovascular and Mediastinum   Essential hypertension    The current medical regimen is effective;  continue present plan and medications.       Relevant Medications   hydrochlorothiazide (HYDRODIURIL) 25 MG tablet     Digestive   Celiac sprue   Relevant Medications    omeprazole (PRILOSEC) 20 MG capsule     Endocrine   Diabetes mellitus without complication (HCC) - Primary    The current medical regimen is effective;  continue present plan and medications.       Relevant Orders   Microalbumin, Urine Waived   Bayer DCA Hb A1c Waived     Other   Depression    The current medical regimen is effective;  continue present plan and medications.       Relevant Medications   diazepam (VALIUM) 5 MG tablet   buPROPion (WELLBUTRIN SR) 150 MG 12 hr tablet    Other Visit Diagnoses    PE (physical exam), annual        Relevant Orders    Lipid panel    TSH    Urinalysis, Routine w reflex microscopic ([facility])        Follow up plan: Return in about 3 months (around 07/12/2015), or if symptoms worsen or fail to improve, for a1c.

## 2015-04-14 ENCOUNTER — Encounter: Payer: Self-pay | Admitting: Family Medicine

## 2015-04-14 LAB — LIPID PANEL
CHOLESTEROL TOTAL: 173 mg/dL (ref 100–199)
Chol/HDL Ratio: 2.4 ratio units (ref 0.0–4.4)
HDL: 72 mg/dL (ref >39)
LDL Calculated: 84 mg/dL (ref 0–99)
Triglycerides: 85 mg/dL (ref 0–149)
VLDL Cholesterol Cal: 17 mg/dL (ref 5–40)

## 2015-04-14 LAB — TSH: TSH: 1.93 u[IU]/mL (ref 0.450–4.500)

## 2015-05-12 ENCOUNTER — Encounter: Payer: Self-pay | Admitting: Family Medicine

## 2015-05-12 ENCOUNTER — Ambulatory Visit (INDEPENDENT_AMBULATORY_CARE_PROVIDER_SITE_OTHER): Payer: Managed Care, Other (non HMO) | Admitting: Family Medicine

## 2015-05-12 VITALS — BP 128/80 | HR 64 | Temp 97.6°F | Ht 58.1 in | Wt 189.0 lb

## 2015-05-12 DIAGNOSIS — K900 Celiac disease: Secondary | ICD-10-CM

## 2015-05-12 DIAGNOSIS — J209 Acute bronchitis, unspecified: Secondary | ICD-10-CM | POA: Diagnosis not present

## 2015-05-12 MED ORDER — AZITHROMYCIN 250 MG PO TABS
ORAL_TABLET | ORAL | Status: DC
Start: 2015-05-12 — End: 2015-07-06

## 2015-05-12 MED ORDER — HYDROCOD POLST-CPM POLST ER 10-8 MG/5ML PO SUER: 5.0000 mL | Freq: Every evening | ORAL | Status: DC | PRN

## 2015-05-12 MED ORDER — BENZONATATE 200 MG PO CAPS: 200.0000 mg | ORAL_CAPSULE | Freq: Three times a day (TID) | ORAL | Status: DC | PRN

## 2015-05-12 MED ORDER — ALBUTEROL SULFATE (2.5 MG/3ML) 0.083% IN NEBU: 2.5000 mg | INHALATION_SOLUTION | Freq: Once | RESPIRATORY_TRACT | Status: DC

## 2015-05-12 MED ORDER — PREDNISONE 10 MG PO TABS: ORAL_TABLET | ORAL | Status: DC

## 2015-05-12 MED ORDER — HYOSCYAMINE SULFATE 0.125 MG PO TABS: 0.1250 mg | ORAL_TABLET | ORAL | Status: DC | PRN

## 2015-05-12 NOTE — Progress Notes (Signed)
BP 128/80 mmHg  Pulse 64  Temp(Src) 97.6 F (36.4 C)  Ht 4' 10.1" (1.476 m)  Wt 189 lb (85.73 kg)  BMI 39.35 kg/m2  SpO2 100%   Subjective:    Patient ID: [name], female    DOB: 12/23/1955, 60 y.o.   MRN: [id number]  HPI: [name] is a 60 y.o. female  Chief Complaint  Patient presents with  . URI    X 3 weeks  . rx refill    Hycosyamine   UPPER RESPIRATORY TRACT INFECTION Duration: 3 weeks Worst symptom: cough and chest congestion Fever: yes Cough: yes Shortness of breath: yes Wheezing: yes Chest pain: no Chest tightness: yes Chest congestion: yes Nasal congestion: yes Runny nose: yes Post nasal drip: yes Sneezing: yes Sore throat: yes Swollen glands: no Sinus pressure: no Headache: yes Face pain: no Toothache: no Ear pain: no  Ear pressure:  no Eyes red/itching:yes Eye drainage/crusting: no  Vomiting: no Rash: no Fatigue: yes Sick contacts: no Strep contacts: no  Context: worse Recurrent sinusitis: no Relief with OTC cold/cough medications: no  Treatments attempted: cold/sinus, mucinex, anti-histamine, pseudoephedrine and cough syrup   Relevant past medical, surgical, family and social history reviewed and updated as indicated. Interim medical history since our last visit reviewed. Allergies and medications reviewed and updated.  Review of Systems  Constitutional: Negative.   HENT: Negative.   Respiratory: Negative.   Cardiovascular: Negative.   Psychiatric/Behavioral: Negative.    Per HPI unless specifically indicated above    Objective:    BP 128/80 mmHg  Pulse 64  Temp(Src) 97.6 F (36.4 C)  Ht 4' 10.1" (1.476 m)  Wt 189 lb (85.73 kg)  BMI 39.35 kg/m2  SpO2 100%  Wt Readings from Last 3 Encounters:  05/12/15 189 lb (85.73 kg)  04/13/15 188 lb (85.276 kg)  02/23/15 180 lb (81.647 kg)    Physical Exam  Constitutional: She is oriented to person, place, and time. She appears well-developed and  well-nourished. No distress.  HENT:  Head: Normocephalic and atraumatic.  Right Ear: Hearing and external ear normal.  Left Ear: Hearing and external ear normal.  Nose: Nose normal.  Mouth/Throat: Oropharynx is clear and moist. No oropharyngeal exudate.  Eyes: Conjunctivae, EOM and lids are normal. Pupils are equal, round, and reactive to light. Right eye exhibits no discharge. Left eye exhibits no discharge. No scleral icterus.  Neck: Normal range of motion. Neck supple. No JVD present. No tracheal deviation present. No thyromegaly present.  Cardiovascular: Normal rate, regular rhythm, normal heart sounds and intact distal pulses.  Exam reveals no gallop and no friction rub.   No murmur heard. Pulmonary/Chest: Effort normal. No stridor. No respiratory distress. She has decreased breath sounds. She has wheezes. She has no rhonchi. She has no rales. She exhibits no tenderness.  Lungs more open after neb, but with fine rhonchi at the bases  Musculoskeletal: Normal range of motion.  Lymphadenopathy:    She has cervical adenopathy.  Neurological: She is alert and oriented to person, place, and time.  Skin: Skin is warm, dry and intact. No rash noted. She is not diaphoretic. No erythema. No pallor.  Psychiatric: She has a normal mood and affect. Her speech is normal and behavior is normal. Judgment and thought content normal. Cognition and memory are normal.  Nursing note and vitals reviewed.   Results for orders placed or performed in visit on 04/13/15  Microalbumin, Urine Waived  Result Value Ref Range   Microalb,  Ur Waived 80 (H) 0 - 19 mg/L   Creatinine, Urine Waived 200 10 - 300 mg/dL   Microalb/Creat Ratio <30 <30 mg/g  Bayer DCA Hb A1c Waived  Result Value Ref Range   Bayer DCA Hb A1c Waived 5.8 <7.0 %  Lipid panel  Result Value Ref Range   Cholesterol, Total 173 100 - 199 mg/dL   Triglycerides 85 0 - 149 mg/dL   HDL 72 >24 mg/dL   VLDL Cholesterol Cal 17 5 - 40 mg/dL   LDL  Calculated 84 0 - 99 mg/dL   Chol/HDL Ratio 2.4 0.0 - 4.4 ratio units  TSH  Result Value Ref Range   TSH 1.930 0.450 - 4.500 uIU/mL  Urinalysis, Routine w reflex microscopic (not at [facility])  Result Value Ref Range   Specific Gravity, UA 1.015 1.005 - 1.030   pH, UA 7.5 5.0 - 7.5   Color, UA Yellow Yellow   Appearance Ur Cloudy (A) Clear   Leukocytes, UA Negative Negative   Protein, UA Negative Negative/Trace   Glucose, UA Negative Negative   Ketones, UA Negative Negative   RBC, UA Negative Negative   Bilirubin, UA Negative Negative   Urobilinogen, Ur 0.2 0.2 - 1.0 mg/dL   Nitrite, UA Negative Negative      Assessment & Plan:   Problem List Items Addressed This Visit      Digestive   Celiac sprue    Under good control. Needs refill of her hyoscyamine which works well. Refill given today. Continue to monitor.        Other Visit Diagnoses    Acute bronchitis, unspecified organism    -  Primary    Will treat with prednisone and azithromycin. Will treat with tussionex and tessalon perles for comfort. Continue to monitor closely. Lung recheck 2 weeks.    Relevant Medications    albuterol (PROVENTIL) (2.5 MG/3ML) 0.083% nebulizer solution 2.5 mg        Follow up plan: Return in about 2 weeks (around 05/26/2015) for lung recheck.

## 2015-05-12 NOTE — Assessment & Plan Note (Signed)
Under good control. Needs refill of her hyoscyamine which works well. Refill given today. Continue to monitor.

## 2015-05-15 ENCOUNTER — Encounter: Payer: Self-pay | Admitting: Family Medicine

## 2015-05-15 ENCOUNTER — Telehealth: Payer: Self-pay | Admitting: Family Medicine

## 2015-05-15 ENCOUNTER — Ambulatory Visit (INDEPENDENT_AMBULATORY_CARE_PROVIDER_SITE_OTHER): Payer: Managed Care, Other (non HMO) | Admitting: Family Medicine

## 2015-05-15 VITALS — BP 136/84 | HR 66 | Temp 98.5°F | Ht 58.3 in | Wt 194.0 lb

## 2015-05-15 DIAGNOSIS — R300 Dysuria: Secondary | ICD-10-CM | POA: Diagnosis not present

## 2015-05-15 DIAGNOSIS — N3001 Acute cystitis with hematuria: Secondary | ICD-10-CM

## 2015-05-15 MED ORDER — CIPROFLOXACIN HCL 500 MG PO TABS: 500.0000 mg | ORAL_TABLET | Freq: Two times a day (BID) | ORAL | Status: DC

## 2015-05-15 MED ORDER — PHENAZOPYRIDINE HCL 200 MG PO TABS: 200.0000 mg | ORAL_TABLET | Freq: Three times a day (TID) | ORAL | Status: DC | PRN

## 2015-05-15 NOTE — Progress Notes (Signed)
BP 136/84 mmHg  Pulse 66  Temp(Src) 98.5 F (36.9 C)  Ht 4' 10.3" (1.481 m)  Wt 194 lb (87.998 kg)  BMI 40.12 kg/m2  SpO2 97%   Subjective:    Patient ID: [name], female    DOB: [birth year], 60 y.o.   MRN: [id number]  HPI: [name] is a 60 y.o. female  Chief Complaint  Patient presents with  . Urinary Tract Infection   URINARY SYMPTOMS Duration: Started Saturday night Dysuria: burning Urinary frequency: yes Urgency: no Small volume voids: yes Symptom severity: mild Urinary incontinence: no Foul odor: no Hematuria: yes Abdominal pain: no Back pain: no Suprapubic pain/pressure: no Flank pain: no Fever:  no Vomiting: no Relief with cranberry juice: no Relief with pyridium: no Status: worse Previous urinary tract infection: yes Recurrent urinary tract infection: no Vaginal discharge: yes Treatments attempted: cranberry and increasing fluids   Relevant past medical, surgical, family and social history reviewed and updated as indicated. Interim medical history since our last visit reviewed. Allergies and medications reviewed and updated.  Review of Systems  Constitutional: Negative.   Respiratory: Negative.   Cardiovascular: Negative.   Gastrointestinal: Negative.   Genitourinary: Negative.   Psychiatric/Behavioral: Negative.     Per HPI unless specifically indicated above     Objective:    BP 136/84 mmHg  Pulse 66  Temp(Src) 98.5 F (36.9 C)  Ht 4' 10.3" (1.481 m)  Wt 194 lb (87.998 kg)  BMI 40.12 kg/m2  SpO2 97%  Wt Readings from Last 3 Encounters:  05/15/15 194 lb (87.998 kg)  05/12/15 189 lb (85.73 kg)  04/13/15 188 lb (85.276 kg)    Physical Exam  Constitutional: She is oriented to person, place, and time. She appears well-developed and well-nourished. No distress.  HENT:  Head: Normocephalic and atraumatic.  Right Ear: Hearing normal.  Left Ear: Hearing normal.  Nose: Nose normal.  Eyes: Conjunctivae and lids  are normal. Right eye exhibits no discharge. Left eye exhibits no discharge. No scleral icterus.  Cardiovascular: Normal rate, regular rhythm, normal heart sounds and intact distal pulses.  Exam reveals no gallop and no friction rub.   No murmur heard. Pulmonary/Chest: Effort normal. No respiratory distress. She has no wheezes. She has rhonchi in the right lower field and the left lower field. She has no rales. She exhibits no tenderness.  Abdominal: Soft. Bowel sounds are normal. She exhibits no distension and no mass. There is no tenderness. There is no rebound, no guarding and no CVA tenderness.  Musculoskeletal: Normal range of motion.  Neurological: She is alert and oriented to person, place, and time.  Skin: Skin is warm, dry and intact. No rash noted. No erythema. No pallor.  Psychiatric: She has a normal mood and affect. Her speech is normal and behavior is normal. Judgment and thought content normal. Cognition and memory are normal.  Nursing note and vitals reviewed.   Results for orders placed or performed in visit on 04/13/15  Microalbumin, Urine Waived  Result Value Ref Range   Microalb, Ur Waived 80 (H) 0 - 19 mg/L   Creatinine, Urine Waived 200 10 - 300 mg/dL   Microalb/Creat Ratio <30 <30 mg/g  Bayer DCA Hb A1c Waived  Result Value Ref Range   Bayer DCA Hb A1c Waived 5.8 <7.0 %  Lipid panel  Result Value Ref Range   Cholesterol, Total 173 100 - 199 mg/dL   Triglycerides 85 0 - 149 mg/dL   HDL 72 >40 mg/dL  VLDL Cholesterol Cal 17 5 - 40 mg/dL   LDL Calculated 84 0 - 99 mg/dL   Chol/HDL Ratio 2.4 0.0 - 4.4 ratio units  TSH  Result Value Ref Range   TSH 1.930 0.450 - 4.500 uIU/mL  Urinalysis, Routine w reflex microscopic (not at [facility])  Result Value Ref Range   Specific Gravity, UA 1.015 1.005 - 1.030   pH, UA 7.5 5.0 - 7.5   Color, UA Yellow Yellow   Appearance Ur Cloudy (A) Clear   Leukocytes, UA Negative Negative   Protein, UA Negative Negative/Trace   Glucose,  UA Negative Negative   Ketones, UA Negative Negative   RBC, UA Negative Negative   Bilirubin, UA Negative Negative   Urobilinogen, Ur 0.2 0.2 - 1.0 mg/dL   Nitrite, UA Negative Negative      Assessment & Plan:   Problem List Items Addressed This Visit    None    Visit Diagnoses    Acute cystitis with hematuria    -  Primary    Will treat with pyridium and cipro. Call if not getting better or getting worse. Await culture.     Dysuria        UA shows 1+ blood and trace leuks. Being sent for culture.     Relevant Orders    Urinalysis, Routine w reflex microscopic (not at [facility])        Follow up plan: Return if symptoms worsen or fail to improve.

## 2015-05-15 NOTE — Telephone Encounter (Signed)
Pt called and stated that she had taken antibiotics and now needs something for a uti [facility](says its painful and bleeding)

## 2015-05-15 NOTE — Telephone Encounter (Signed)
Patient being seen today

## 2015-05-17 LAB — UA/M W/RFLX CULTURE, ROUTINE
Bilirubin, UA: NEGATIVE
Glucose, UA: NEGATIVE
Ketones, UA: NEGATIVE
Nitrite, UA: NEGATIVE
PH UA: 7 (ref 5.0–7.5)
PROTEIN UA: NEGATIVE
Specific Gravity, UA: 1.015 (ref 1.005–1.030)
UUROB: 0.2 mg/dL (ref 0.2–1.0)

## 2015-05-17 LAB — MICROSCOPIC EXAMINATION

## 2015-05-17 LAB — URINE CULTURE, REFLEX

## 2015-05-26 ENCOUNTER — Ambulatory Visit: Payer: Managed Care, Other (non HMO) | Admitting: Family Medicine

## 2015-07-06 ENCOUNTER — Encounter: Payer: Self-pay | Admitting: Family Medicine

## 2015-07-06 ENCOUNTER — Ambulatory Visit (INDEPENDENT_AMBULATORY_CARE_PROVIDER_SITE_OTHER): Payer: Managed Care, Other (non HMO) | Admitting: Family Medicine

## 2015-07-06 VITALS — BP 128/78 | HR 60 | Temp 98.2°F | Ht 58.3 in | Wt 191.0 lb

## 2015-07-06 DIAGNOSIS — F329 Major depressive disorder, single episode, unspecified: Secondary | ICD-10-CM

## 2015-07-06 DIAGNOSIS — I10 Essential (primary) hypertension: Secondary | ICD-10-CM | POA: Diagnosis not present

## 2015-07-06 DIAGNOSIS — F32A Depression, unspecified: Secondary | ICD-10-CM

## 2015-07-06 DIAGNOSIS — E119 Type 2 diabetes mellitus without complications: Secondary | ICD-10-CM | POA: Diagnosis not present

## 2015-07-06 LAB — BAYER DCA HB A1C WAIVED: HB A1C: 5.9 % (ref <7.0)

## 2015-07-06 MED ORDER — ALBUTEROL SULFATE HFA 108 (90 BASE) MCG/ACT IN AERS: 2.0000 | INHALATION_SPRAY | Freq: Four times a day (QID) | RESPIRATORY_TRACT | Status: DC | PRN

## 2015-07-06 MED ORDER — DIAZEPAM 5 MG PO TABS: 5.0000 mg | ORAL_TABLET | Freq: Every day | ORAL | Status: DC | PRN

## 2015-07-06 NOTE — Assessment & Plan Note (Signed)
The current medical regimen is effective;  continue present plan and medications. Will cont PRN valium

## 2015-07-06 NOTE — Assessment & Plan Note (Signed)
The current medical regimen is effective;  continue present plan and medications.  

## 2015-07-06 NOTE — Progress Notes (Signed)
BP 128/78 mmHg  Pulse 60  Temp(Src) 98.2 F (36.8 C)  Ht 4' 10.3" (1.481 m)  Wt 191 lb (86.637 kg)  BMI 39.50 kg/m2  SpO2 99%   Subjective:    Patient ID: [name], female    DOB: July 24, 1955, 60 y.o.   MRN: [id number]  HPI: Julia FireCynthia Renee Gothard is a 60 y.o. female  Chief Complaint  Patient presents with  . Diabetes   patient doing well with diabetes noted low blood sugar spells diet controlled Depression doing well with medications has been under a lot of stress with her elderly father and boyfriend with renal cancer and alcoholic son. Patient using Valium on a when necessary basis needs refill Blood pressure doing well no complaints from hydrochlorothiazide taking medicines faithfully Relevant past medical, surgical, family and social history reviewed and updated as indicated. Interim medical history since our last visit reviewed. Allergies and medications reviewed and updated.  Review of Systems  Constitutional: Negative.   Respiratory: Negative.   Cardiovascular: Negative.     Per HPI unless specifically indicated above     Objective:    BP 128/78 mmHg  Pulse 60  Temp(Src) 98.2 F (36.8 C)  Ht 4' 10.3" (1.481 m)  Wt 191 lb (86.637 kg)  BMI 39.50 kg/m2  SpO2 99%  Wt Readings from Last 3 Encounters:  07/06/15 191 lb (86.637 kg)  05/15/15 194 lb (87.998 kg)  05/12/15 189 lb (85.73 kg)    Physical Exam  Constitutional: She is oriented to person, place, and time. She appears well-developed and well-nourished. No distress.  HENT:  Head: Normocephalic and atraumatic.  Right Ear: Hearing normal.  Left Ear: Hearing normal.  Nose: Nose normal.  Eyes: Conjunctivae and lids are normal. Right eye exhibits no discharge. Left eye exhibits no discharge. No scleral icterus.  Cardiovascular: Normal rate, regular rhythm and normal heart sounds.   Pulmonary/Chest: Effort normal and breath sounds normal. No respiratory distress.  Musculoskeletal: Normal  range of motion.  Neurological: She is alert and oriented to person, place, and time.  Skin: Skin is intact. No rash noted.  Psychiatric: She has a normal mood and affect. Her speech is normal and behavior is normal. Judgment and thought content normal. Cognition and memory are normal.    Results for orders placed or performed in visit on 05/15/15  Microscopic Examination  Result Value Ref Range   WBC, UA 0-5 0 -  5 /hpf   RBC, UA 3-10 (A) 0 -  2 /hpf   Epithelial Cells (non renal) 0-10 0 - 10 /hpf   Bacteria, UA Few None seen/Few  UA/M w/rflx Culture, Routine  Result Value Ref Range   Specific Gravity, UA 1.015 1.005 - 1.030   pH, UA 7.0 5.0 - 7.5   Color, UA Yellow Yellow   Appearance Ur Clear Clear   Leukocytes, UA Trace (A) Negative   Protein, UA Negative Negative/Trace   Glucose, UA Negative Negative   Ketones, UA Negative Negative   RBC, UA 1+ (A) Negative   Bilirubin, UA Negative Negative   Urobilinogen, Ur 0.2 0.2 - 1.0 mg/dL   Nitrite, UA Negative Negative   Microscopic Examination See below:    Urinalysis Reflex Comment   Urine Culture, Routine  Result Value Ref Range   Urine Culture, Routine Final report    Urine Culture result 1 Comment       Assessment & Plan:   Problem List Items Addressed This Visit  Cardiovascular and Mediastinum   Essential hypertension    The current medical regimen is effective;  continue present plan and medications.         Endocrine   Diabetes mellitus without complication (HCC) - Primary    The current medical regimen is effective;  continue present plan and medications.       Relevant Orders   Bayer DCA Hb A1c Waived     Other   Depression    The current medical regimen is effective;  continue present plan and medications. Will cont PRN valium      Relevant Medications   diazepam (VALIUM) 5 MG tablet       Follow up plan: Return if symptoms worsen or fail to improve, for August  a1c BMP.

## 2015-07-12 ENCOUNTER — Telehealth: Payer: Self-pay | Admitting: Family Medicine

## 2015-07-12 NOTE — Telephone Encounter (Signed)
Pt will wait for MAC

## 2015-07-12 NOTE — Telephone Encounter (Signed)
Pt called and stated that she had bronchitis and would like to have something sent to [facility].

## 2015-07-15 ENCOUNTER — Emergency Department: Payer: Managed Care, Other (non HMO)

## 2015-07-15 ENCOUNTER — Emergency Department
Admission: EM | Admit: 2015-07-15 | Discharge: 2015-07-15 | Disposition: A | Discharge status: Home / Self Care | Payer: Managed Care, Other (non HMO) | Attending: Emergency Medicine | Admitting: Emergency Medicine

## 2015-07-15 DIAGNOSIS — W19XXXA Unspecified fall, initial encounter: Secondary | ICD-10-CM

## 2015-07-15 DIAGNOSIS — Y999 Unspecified external cause status: Secondary | ICD-10-CM | POA: Insufficient documentation

## 2015-07-15 DIAGNOSIS — J069 Acute upper respiratory infection, unspecified: Secondary | ICD-10-CM

## 2015-07-15 DIAGNOSIS — Y9389 Activity, other specified: Secondary | ICD-10-CM | POA: Diagnosis not present

## 2015-07-15 DIAGNOSIS — Z90710 Acquired absence of both cervix and uterus: Secondary | ICD-10-CM | POA: Insufficient documentation

## 2015-07-15 DIAGNOSIS — Y92532 Urgent care center as the place of occurrence of the external cause: Secondary | ICD-10-CM | POA: Diagnosis not present

## 2015-07-15 DIAGNOSIS — Z9049 Acquired absence of other specified parts of digestive tract: Secondary | ICD-10-CM | POA: Insufficient documentation

## 2015-07-15 DIAGNOSIS — I10 Essential (primary) hypertension: Secondary | ICD-10-CM | POA: Diagnosis not present

## 2015-07-15 DIAGNOSIS — W010XXA Fall on same level from slipping, tripping and stumbling without subsequent striking against object, initial encounter: Secondary | ICD-10-CM | POA: Diagnosis not present

## 2015-07-15 DIAGNOSIS — S0081XA Abrasion of other part of head, initial encounter: Secondary | ICD-10-CM | POA: Insufficient documentation

## 2015-07-15 DIAGNOSIS — S0993XA Unspecified injury of face, initial encounter: Secondary | ICD-10-CM

## 2015-07-15 DIAGNOSIS — Z79899 Other long term (current) drug therapy: Secondary | ICD-10-CM | POA: Diagnosis not present

## 2015-07-15 DIAGNOSIS — E785 Hyperlipidemia, unspecified: Secondary | ICD-10-CM | POA: Insufficient documentation

## 2015-07-15 DIAGNOSIS — Z880 Allergy status to penicillin: Secondary | ICD-10-CM | POA: Diagnosis not present

## 2015-07-15 DIAGNOSIS — E119 Type 2 diabetes mellitus without complications: Secondary | ICD-10-CM | POA: Diagnosis not present

## 2015-07-15 DIAGNOSIS — J45909 Unspecified asthma, uncomplicated: Secondary | ICD-10-CM | POA: Insufficient documentation

## 2015-07-15 DIAGNOSIS — F329 Major depressive disorder, single episode, unspecified: Secondary | ICD-10-CM | POA: Diagnosis not present

## 2015-07-15 DIAGNOSIS — L03213 Periorbital cellulitis: Secondary | ICD-10-CM | POA: Diagnosis not present

## 2015-07-15 LAB — CBC
HEMATOCRIT: 41.4 % (ref 35.0–47.0)
HEMOGLOBIN: 13.9 g/dL (ref 12.0–16.0)
MCH: 30 pg (ref 26.0–34.0)
MCHC: 33.6 g/dL (ref 32.0–36.0)
MCV: 89.4 fL (ref 80.0–100.0)
PLATELETS: 252 10*3/uL (ref 150–440)
RBC: 4.63 MIL/uL (ref 3.80–5.20)
RDW: 12.7 % (ref 11.5–14.5)
WBC: 7.6 10*3/uL (ref 3.6–11.0)

## 2015-07-15 LAB — BASIC METABOLIC PANEL
ANION GAP: 6 (ref 5–15)
BUN: 12 mg/dL (ref 6–20)
CALCIUM: 8.9 mg/dL (ref 8.9–10.3)
CO2: 27 mmol/L (ref 22–32)
Chloride: 104 mmol/L (ref 101–111)
Creatinine, Ser: 0.65 mg/dL (ref 0.44–1.00)
GFR calc Af Amer: >60 mL/min (ref >60)
GFR calc non Af Amer: >60 mL/min (ref >60)
GLUCOSE: 123 mg/dL — AB (ref 65–99)
Potassium: 3.7 mmol/L (ref 3.5–5.1)
Sodium: 137 mmol/L (ref 135–145)

## 2015-07-15 MED ORDER — TETANUS-DIPHTH-ACELL PERTUSSIS 5-2.5-18.5 LF-MCG/0.5 IM SUSP
0.5000 mL | Freq: Once | INTRAMUSCULAR | Status: AC
  Administered 2015-07-15: 0.5 mL via INTRAMUSCULAR
  Filled 2015-07-15: qty 0.5

## 2015-07-15 MED ORDER — OXYCODONE-ACETAMINOPHEN 5-325 MG PO TABS: 1.0000 | ORAL_TABLET | Freq: Four times a day (QID) | ORAL | Status: DC | PRN

## 2015-07-15 MED ORDER — BACITRACIN ZINC 500 UNIT/GM EX OINT
TOPICAL_OINTMENT | Freq: Once | CUTANEOUS | Status: AC
  Administered 2015-07-15: 1 via TOPICAL
  Filled 2015-07-15: qty 0.9

## 2015-07-15 MED ORDER — IOPAMIDOL (ISOVUE-300) INJECTION 61%
75.0000 mL | Freq: Once | INTRAVENOUS | Status: AC | PRN
  Administered 2015-07-15: 75 mL via INTRAVENOUS

## 2015-07-15 MED ORDER — CLINDAMYCIN HCL 300 MG PO CAPS: 300.0000 mg | ORAL_CAPSULE | Freq: Three times a day (TID) | ORAL | Status: AC

## 2015-07-15 MED ORDER — FENTANYL CITRATE (PF) 100 MCG/2ML IJ SOLN
50.0000 ug | Freq: Once | INTRAMUSCULAR | Status: AC
  Administered 2015-07-15: 50 ug via INTRAVENOUS
  Filled 2015-07-15: qty 2

## 2015-07-15 MED ORDER — SODIUM CHLORIDE 0.9 % IV BOLUS (SEPSIS)
1000.0000 mL | Freq: Once | INTRAVENOUS | Status: AC
  Administered 2015-07-15: 1000 mL via INTRAVENOUS

## 2015-07-15 NOTE — ED Notes (Signed)
Pt transported to CT scan.

## 2015-07-15 NOTE — ED Notes (Signed)
Bacitracin applied to lacerations on face.

## 2015-07-15 NOTE — ED Notes (Signed)
Pt came to ED via EMS. Pt went to urgent care and was told to come to ED for possible orbital cellulitis. When pt was leaving urgent care pt tripped and fell. Pt has abrasion to face. Right eye is red and swollen. Pt reports it is itchy and painful.

## 2015-07-15 NOTE — ED Provider Notes (Signed)
[facility] Emergency Department Provider Note  ____________________________________________  Time seen: Approximately 10:14 AM  I have reviewed the triage vital signs and the nursing notes.   HISTORY  Chief Complaint Eye Pain    HPI Rosita FireCynthia Renee Flannigan is a 60 y.o. female with a history of DM sent from urgent care for right eye swelling and erythema. Patient reports that since yesterday she has had progressively worsening erythema around the right orbit with associated redness of the eye and crusty discharge. She is having pain with extraocular movements. She denies any severe headache, fever, chills nausea or vomiting. On the way here, she tripped over a pipe, and landed on her face resulting in abrasions over the nose and upper lip. She did not lose consciousness and denies any dental injury. She is not having any neck pain. She did not have any preceding chest pain, shortness of breath or palpitations.   Past Medical History  Diagnosis Date  . Hyperlipidemia   . Depression   . Diabetes mellitus without complication (HCC)   . Asthma   . Migraine headache   . Kidney stones     Patient Active Problem List   Diagnosis Date Noted  . Essential hypertension 10/20/2014  . Diabetes mellitus without complication (HCC) 10/20/2014  . Celiac sprue 10/20/2014  . Depression 10/20/2014  . BMI 40.0-44.9, adult (HCC) 10/20/2014    Past Surgical History  Procedure Laterality Date  . Abdominal hysterectomy    . Cholecystectomy    . Colonoscopy  March 2016  . Breast cyst aspiration Right     benign  . Breast cyst aspiration Right     benign    Current Outpatient Rx  Name  Route  Sig  Dispense  Refill  . albuterol (PROVENTIL HFA;VENTOLIN HFA) 108 (90 Base) MCG/ACT inhaler   Inhalation   Inhale 2 puffs into the lungs every 6 (six) hours as needed for wheezing or shortness of breath.   1 Inhaler   2   . budesonide-formoterol (SYMBICORT) 160-4.5 MCG/ACT  inhaler   Inhalation   Inhale 2 puffs into the lungs 2 (two) times daily.   3 Inhaler   4   . buPROPion (WELLBUTRIN SR) 150 MG 12 hr tablet   Oral   Take 1 tablet (150 mg total) by mouth 3 (three) times daily.   270 tablet   4   . clindamycin (CLEOCIN) 300 MG capsule   Oral   Take 1 capsule (300 mg total) by mouth 3 (three) times daily.   30 capsule   0   . diazepam (VALIUM) 5 MG tablet   Oral   Take 1 tablet (5 mg total) by mouth daily as needed for anxiety.   30 tablet   3   . diphenoxylate-atropine (LOMOTIL) 2.5-0.025 MG tablet   Oral   Take by mouth 4 (four) times daily as needed for diarrhea or loose stools.         . hydrochlorothiazide (HYDRODIURIL) 25 MG tablet   Oral   Take 1 tablet (25 mg total) by mouth daily.   90 tablet   4   . EXPIRED: hyoscyamine (LEVSIN, ANASPAZ) 0.125 MG tablet   Oral   Take 1 tablet (0.125 mg total) by mouth every 4 (four) hours as needed.   120 tablet   3   . mirabegron ER (MYRBETRIQ) 25 MG TB24 tablet   Oral   Take 1 tablet (25 mg total) by mouth daily.   90 tablet  4     FOR NEXT FILL. THANK YOU   . omeprazole (PRILOSEC) 20 MG capsule   Oral   Take 1 capsule (20 mg total) by mouth daily.   90 capsule   4   . oxyCODONE-acetaminophen (ROXICET) 5-325 MG tablet   Oral   Take 1 tablet by mouth every 6 (six) hours as needed.   6 tablet   0     Allergies Amoxicillin  Family History  Problem Relation Age of Onset  . Hypertension Mother   . Cancer Mother     breast  . Arthritis Mother   . Alzheimer's disease Mother   . Parkinson's disease Mother   . Breast cancer Mother 65  . Hypertension Father   . Diabetes Father   . Breast cancer Maternal Aunt   . Breast cancer Maternal Grandmother 71  . Breast cancer Cousin 38    Social History Social History  Substance Use Topics  . Smoking status: Never Smoker   . Smokeless tobacco: Never Used  . Alcohol Use: Yes    Review of Systems Constitutional: No  fever/chills. No lightheadedness or syncope. No loss of consciousness.  Eyes: No visual changes. No blurred or double vision. Positive erythema around the right eye. Positive erythema of the right eye. Positive clear discharge during the day and crusts at night. Positive pain with extraocular movements. ENT: No sore throat. As of congestion and rhinorrhea. No neck stiffness or pain. Cardiovascular: Denies chest pain, palpitations. Respiratory: Denies shortness of breath.  Positive nonproductive cough. Gastrointestinal: No abdominal pain.  No nausea, no vomiting.  No diarrhea.  No constipation. Genitourinary: Negative for dysuria. Musculoskeletal: Negative for back pain. Skin: Negative for rash. Neurological: Negative for headaches, focal weakness or numbness.  10-point ROS otherwise negative.  ____________________________________________   PHYSICAL EXAM:  VITAL SIGNS: ED Triage Vitals  Enc Vitals Group     BP --      Pulse --      Resp --      Temp --      Temp src --      SpO2 --      Weight --      Height --      Head Cir --      Peak Flow --      Pain Score --      Pain Loc --      Pain Edu? --      Excl. in GC? --     Constitutional: Patient is alert and oriented and answering questions appropriate. GCS is 15.  Eyes: Right orbit has circumscribed erythema, mild swelling and warmth. Positive conjunctival erythema. PERRLA. Extraocular movements are intact but the patient reports pain in the right eye with eye movements. Head: Positive abrasion to the entire nose and upper lip with mild swelling of the upper lip. 0.25 x 0.5 cm deeper abrasion on the proximal nose with missing skin that cannot be approximated. No open bone. No raccoon eyes or Battle sign. Nose: No congestion/rhinnorhea. See above. Mouth/Throat: Mucous membranes are moist. No malocclusion or dental injury. There are no loose teeth on my exam. Neck: No stridor.  Supple.  No JVD. No C-spine tenderness to  palpation, step-offs or deformities. Full range of motion without pain. Cardiovascular: Normal rate, regular rhythm. No murmurs, rubs or gallops.  Respiratory: Normal respiratory effort.  No retractions. Lungs CTAB.  No wheezes, rales or ronchi. Musculoskeletal: No LE edema.  Neurologic:  Alert and oriented 3.  Speech is clear. Face and smile are symmetric. EOMI and PERRLA. Moves all extremities well. Stable gait.  Skin:  Skin is warm, dry. See above for rash and abrasions on the skin. Psychiatric: Mood and affect are normal. Speech and behavior are normal.  Normal judgement.  ____________________________________________   LABS (all labs ordered are listed, but only abnormal results are displayed)  Labs Reviewed  BASIC METABOLIC PANEL - Abnormal; Notable for the following:    Glucose, Bld 123 (*)    All other components within normal limits  CBC   ____________________________________________  EKG  Not indicated ____________________________________________  RADIOLOGY  Ct Maxillofacial W/cm  07/15/2015  CLINICAL DATA:  Facial injury sustained after seeing a provider for RIGHT eye cellulitic change earlier today. EXAM: CT MAXILLOFACIAL WITH CONTRAST TECHNIQUE: Multidetector CT imaging of the maxillofacial structures was performed with intravenous contrast. Multiplanar CT image reconstructions were also generated. A small metallic BB was placed on the right temple in order to reliably differentiate right from left. CONTRAST:  75mL ISOVUE-300 IOPAMIDOL (ISOVUE-300) INJECTION 61% COMPARISON:  None. FINDINGS: There may be mild RIGHT preseptal periorbital soft tissue swelling. Both globes are intact. No orbital abscess, or postseptal inflammation. No facial fracture. TMJs are located. Degenerative change LEFT TMJ. Mucosal thickening throughout the paranasal sinuses including the frontal, ethmoid, sphenoid, and maxillary regions favored to represent predominantly chronic sinusitis. No dominant or  significant air-fluid level to suggest acuity. No blowout injury. Soft tissue swelling over the nose and chin. Tiny radiopaque density in the LEFT paramedian upper lip of unknown acuity see for instance image 50 series 3. Cervical spondylosis. Negative visualized intracranial compartment. Airway midline. Visualized salivary glands unremarkable. IMPRESSION: No facial fracture is evident. No blowout injury or significant sinus air-fluid level. Chronic sinusitis. Tiny radiopaque density LEFT paramedian upper lip of uncertain acuity. Could this represent a foreign body? Mild RIGHT preseptal periorbital cellulitis without orbital abscess, or postseptal inflammatory process. Electronically Signed   By: Elsie Stain M.D.   On: 07/15/2015 11:11    ____________________________________________   PROCEDURES  Procedure(s) performed: None  Critical Care performed: No ____________________________________________   INITIAL IMPRESSION / ASSESSMENT AND PLAN / ED COURSE  Pertinent labs & imaging results that were available during my care of the patient were reviewed by me and considered in my medical decision making (see chart for details).  60 y.o. female sent from urgent care for preseptal cellulitis with concern for orbital cellulitis, status post fall with resulting facial trauma. The patient is not anticoagulated and has not had any headache, nausea or vomiting. It is unlikely that she has intracranial trauma. I will evaluate her for orbital cellulitis, as well as facial bone fractures. Patient symptomatically treatment has been initiated and we will reevaluate after her diagnostic workup is complete.  ----------------------------------------- 11:31 AM on 07/15/2015 -----------------------------------------  The patient has no evidence of acute fracture of the face on her CT scan. She has a preseptal cellulitis without evidence of orbital cellulitis. She has a possible foreign body in the left upper  lip, which I will reevaluate. The patient has had a tetanus booster and a triple antibiotic cream applied to the abrasions over her nose and upper lip. We'll plan discharge with close PMD follow-up. We had a long discussion about return precautions and follow-up.  ____________________________________________  FINAL CLINICAL IMPRESSION(S) / ED DIAGNOSES  Final diagnoses:  Facial injury, initial encounter  Preseptal cellulitis of right eye  Abrasion of face, initial encounter  Fall, initial encounter  URI, acute  NEW MEDICATIONS STARTED DURING THIS VISIT:  New Prescriptions   CLINDAMYCIN (CLEOCIN) 300 MG CAPSULE    Take 1 capsule (300 mg total) by mouth 3 (three) times daily.   OXYCODONE-ACETAMINOPHEN (ROXICET) 5-325 MG TABLET    Take 1 tablet by mouth every 6 (six) hours as needed.     Rockne Menghini, MD 07/15/15 1132

## 2015-07-15 NOTE — Discharge Instructions (Signed)
For your abrasions, you may apply a triple antibiotic cream such as bacitracin or Neosporin and a thick coat 3 times daily. Please keep the areas clean and monitor for signs of infection including drainage of pus, swelling, or severe pain.  For the infection of the skin around your eye, preseptal cellulitis, please take the entire course of antibiotics even if you're feeling better.  Return to the emergency department if you develop severe headache, worsening redness or swelling around the eye, visual changes, neck stiffness, vomiting, or any other symptoms concerning to you.  Abrasion An abrasion is a cut or scrape on the outer surface of your skin. An abrasion does not extend through all of the layers of your skin. It is important to care for your abrasion properly to prevent infection. CAUSES Most abrasions are caused by falling on or gliding across the ground or another surface. When your skin rubs on something, the outer and inner layer of skin rubs off.  SYMPTOMS A cut or scrape is the main symptom of this condition. The scrape may be bleeding, or it may appear red or pink. If there was an associated fall, there may be an underlying bruise. DIAGNOSIS An abrasion is diagnosed with a physical exam. TREATMENT Treatment for this condition depends on how large and deep the abrasion is. Usually, your abrasion will be cleaned with water and mild soap. This removes any dirt or debris that may be stuck. An antibiotic ointment may be applied to the abrasion to help prevent infection. A bandage (dressing) may be placed on the abrasion to keep it clean. You may also need a tetanus shot. HOME CARE INSTRUCTIONS Medicines  Take or apply medicines only as directed by your health care provider.  If you were prescribed an antibiotic ointment, finish all of it even if you start to feel better. Wound Care  Clean the wound with mild soap and water 2-3 times per day or as directed by your health care  provider. Pat your wound dry with a clean towel. Do not rub it.  There are many different ways to close and cover a wound. Follow instructions from your health care provider about:  Wound care.  Dressing changes and removal.  Check your wound every day for signs of infection. Watch for:  Redness, swelling, or pain.  Fluid, blood, or pus. General Instructions  Keep the dressing dry as directed by your health care provider. Do not take baths, swim, use a hot tub, or do anything that would put your wound underwater until your health care provider approves.  If there is swelling, raise (elevate) the injured area above the level of your heart while you are sitting or lying down.  Keep all follow-up visits as directed by your health care provider. This is important. SEEK MEDICAL CARE IF:  You received a tetanus shot and you have swelling, severe pain, redness, or bleeding at the injection site.  Your pain is not controlled with medicine.  You have increased redness, swelling, or pain at the site of your wound. SEEK IMMEDIATE MEDICAL CARE IF:  You have a red streak going away from your wound.  You have a fever.  You have fluid, blood, or pus coming from your wound.  You notice a bad smell coming from your wound or your dressing.   This information is not intended to replace advice given to you by your health care provider. Make sure you discuss any questions you have with your health care provider.  Document Released: 01/16/2005 Document Revised: 12/28/2014 Document Reviewed: 04/06/2014 Elsevier Interactive Patient Education [employer]2016 [employer]

## 2015-08-03 ENCOUNTER — Telehealth: Payer: Self-pay | Admitting: Family Medicine

## 2015-08-03 NOTE — Telephone Encounter (Signed)
Pt called and stated that she had to put her dad in a nursing home and she would like to have something sent [facility]

## 2015-08-07 NOTE — Telephone Encounter (Signed)
Called, no answer. Will call back later

## 2015-08-07 NOTE — Telephone Encounter (Signed)
Phone call Discussed with patient discussed what sounds likes dying in now in a nursing home and not eating reviewed with patient discussed she can take an extra half a Valium a day.

## 2015-08-07 NOTE — Telephone Encounter (Signed)
Call pt 

## 2015-09-13 ENCOUNTER — Ambulatory Visit: Payer: Managed Care, Other (non HMO) | Admitting: Family Medicine

## 2015-09-19 ENCOUNTER — Encounter: Payer: Self-pay | Admitting: Family Medicine

## 2015-09-19 ENCOUNTER — Ambulatory Visit (INDEPENDENT_AMBULATORY_CARE_PROVIDER_SITE_OTHER): Payer: Managed Care, Other (non HMO) | Admitting: Family Medicine

## 2015-09-19 VITALS — BP 128/78 | HR 65 | Temp 98.0°F | Ht 58.3 in | Wt 193.0 lb

## 2015-09-19 DIAGNOSIS — M545 Low back pain, unspecified: Secondary | ICD-10-CM | POA: Insufficient documentation

## 2015-09-19 DIAGNOSIS — Z6841 Body Mass Index (BMI) 40.0 and over, adult: Secondary | ICD-10-CM

## 2015-09-19 DIAGNOSIS — IMO0002 Reserved for concepts with insufficient information to code with codable children: Secondary | ICD-10-CM | POA: Insufficient documentation

## 2015-09-19 DIAGNOSIS — S00551A Superficial foreign body of lip, initial encounter: Secondary | ICD-10-CM | POA: Insufficient documentation

## 2015-09-19 DIAGNOSIS — F32A Depression, unspecified: Secondary | ICD-10-CM

## 2015-09-19 DIAGNOSIS — F329 Major depressive disorder, single episode, unspecified: Secondary | ICD-10-CM

## 2015-09-19 DIAGNOSIS — H8110 Benign paroxysmal vertigo, unspecified ear: Secondary | ICD-10-CM

## 2015-09-19 MED ORDER — MONTELUKAST SODIUM 10 MG PO TABS: 10.0000 mg | ORAL_TABLET | Freq: Every day | ORAL | Status: DC

## 2015-09-19 MED ORDER — CYCLOBENZAPRINE HCL 5 MG PO TABS: 5.0000 mg | ORAL_TABLET | Freq: Three times a day (TID) | ORAL | Status: DC | PRN

## 2015-09-19 MED ORDER — DIAZEPAM 5 MG PO TABS: 5.0000 mg | ORAL_TABLET | Freq: Every day | ORAL | Status: DC | PRN

## 2015-09-19 NOTE — Assessment & Plan Note (Signed)
Discuss weight loss medication diet exercise nutrition

## 2015-09-19 NOTE — Assessment & Plan Note (Signed)
Discuss positional vertigo will refer to ear nose and throat for further evaluation and treatment

## 2015-09-19 NOTE — Progress Notes (Signed)
BP 128/78 mmHg  Pulse 65  Temp(Src) 98 F (36.7 C)  Ht 4' 10.3" (1.481 m)  Wt 193 lb (87.544 kg)  BMI 39.91 kg/m2  SpO2 97%   Subjective:    Patient ID: [name], female    DOB: January 20, 1956, 60 y.o.   MRN: [id number]  HPI: Julia FireCynthia Renee Caplin is a 60 y.o. female  Chief Complaint  Patient presents with  . Asthma  . patient has "object" in lip from fall  . Back Pain  She was several concerns most prominent is positional vertigo is been ongoing for about a week with bending over or standing up getting dizzy twisting her head just so gets very dizzy then goes away doesn't bother her sitting still or not moving her head or bending or twisting. This is been so profound that she is throwing up with the dizziness.  Patient fell 3-25 scraped her face concrete on with marked abrasions on her face especially her upper lip area with some retained foreign body which was of adjacent to her lip and now is moved up closer to her nose is uncomfortable stays red and inflamed.  Patient also discussed bothered her back today no radicular symptoms blood in stool or urine just messed up her back somehow wants a muscle relaxer.   Relevant past medical, surgical, family and social history reviewed and updated as indicated. Interim medical history since our last visit reviewed. Allergies and medications reviewed and updated.  Review of Systems  Constitutional: Negative.   Respiratory: Negative.   Cardiovascular: Negative.     Per HPI unless specifically indicated above     Objective:    BP 128/78 mmHg  Pulse 65  Temp(Src) 98 F (36.7 C)  Ht 4' 10.3" (1.481 m)  Wt 193 lb (87.544 kg)  BMI 39.91 kg/m2  SpO2 97%  Wt Readings from Last 3 Encounters:  09/19/15 193 lb (87.544 kg)  07/15/15 181 lb (82.101 kg)  07/06/15 191 lb (86.637 kg)    Physical Exam  Constitutional: She is oriented to person, place, and time. She appears well-developed and well-nourished. No distress.   HENT:  Head: Normocephalic and atraumatic.  Right Ear: Hearing and external ear normal.  Left Ear: Hearing and external ear normal.  Nose: Nose normal.  Mouth/Throat: Oropharynx is clear and moist.  Eyes: Conjunctivae and lids are normal. Right eye exhibits no discharge. Left eye exhibits no discharge. No scleral icterus.  Cardiovascular: Normal rate, regular rhythm and normal heart sounds.   Pulmonary/Chest: Effort normal and breath sounds normal. No respiratory distress.  Musculoskeletal: Normal range of motion.  Low back left side muscle spasm  Neurological: She is alert and oriented to person, place, and time.  Skin: Skin is intact. No rash noted.  Small lesion under nose left side  Psychiatric: She has a normal mood and affect. Her speech is normal and behavior is normal. Judgment and thought content normal. Cognition and memory are normal.    Results for orders placed or performed during the hospital encounter of 07/15/15  CBC  Result Value Ref Range   WBC 7.6 3.6 - 11.0 K/uL   RBC 4.63 3.80 - 5.20 MIL/uL   Hemoglobin 13.9 12.0 - 16.0 g/dL   HCT 40.941.4 81.135.0 - 91.447.0 %   MCV 89.4 80.0 - 100.0 fL   MCH 30.0 26.0 - 34.0 pg   MCHC 33.6 32.0 - 36.0 g/dL   RDW 78.212.7 95.611.5 - 21.314.5 %   Platelets 252 150 -  440 K/uL  Basic metabolic panel  Result Value Ref Range   Sodium 137 135 - 145 mmol/L   Potassium 3.7 3.5 - 5.1 mmol/L   Chloride 104 101 - 111 mmol/L   CO2 27 22 - 32 mmol/L   Glucose, Bld 123 (H) 65 - 99 mg/dL   BUN 12 6 - 20 mg/dL   Creatinine, Ser 4.01 0.44 - 1.00 mg/dL   Calcium 8.9 8.9 - 02.7 mg/dL   GFR calc non Af Amer >60 >60 mL/min   GFR calc Af Amer >60 >60 mL/min   Anion gap 6 5 - 15      Assessment & Plan:   Problem List Items Addressed This Visit      Digestive   Foreign body in lip    Discuss ENT referral      Relevant Orders   Ambulatory referral to ENT     Other   Depression   Relevant Medications   diazepam (VALIUM) 5 MG tablet   BMI 40.0-44.9,  adult (HCC)    Discuss weight loss medication diet exercise nutrition      Positional vertigo - Primary    Discuss positional vertigo will refer to ear nose and throat for further evaluation and treatment      Relevant Orders   Ambulatory referral to ENT   Low back pain    Discuss back pain care posture exercises stretching and use of muscle relaxers with caution about drowsiness and driving.      Relevant Medications   cyclobenzaprine (FLEXERIL) 5 MG tablet       Follow up plan: Return in about 4 weeks (around 10/17/2015) for Diabetes recheck A1c.

## 2015-09-19 NOTE — Assessment & Plan Note (Signed)
Discuss back pain care posture exercises stretching and use of muscle relaxers with caution about drowsiness and driving.

## 2015-09-19 NOTE — Assessment & Plan Note (Signed)
Discuss ENT referral

## 2015-10-10 ENCOUNTER — Telehealth: Payer: Self-pay

## 2015-10-10 MED ORDER — SOLIFENACIN SUCCINATE 5 MG PO TABS: 5.0000 mg | ORAL_TABLET | Freq: Every day | ORAL | Status: DC

## 2015-10-10 NOTE — Telephone Encounter (Signed)
Patient would like a new RX called in for urine leakage.  Patient states that the current medication is not working.    Please send RX to [facility] [phone]

## 2015-11-02 ENCOUNTER — Other Ambulatory Visit: Payer: Self-pay | Admitting: Family Medicine

## 2015-11-15 ENCOUNTER — Ambulatory Visit (INDEPENDENT_AMBULATORY_CARE_PROVIDER_SITE_OTHER): Payer: Managed Care, Other (non HMO) | Admitting: Family Medicine

## 2015-11-15 ENCOUNTER — Encounter: Payer: Self-pay | Admitting: Family Medicine

## 2015-11-15 VITALS — BP 122/79 | HR 60 | Temp 97.9°F | Ht 58.7 in | Wt 195.0 lb

## 2015-11-15 DIAGNOSIS — I10 Essential (primary) hypertension: Secondary | ICD-10-CM | POA: Diagnosis not present

## 2015-11-15 DIAGNOSIS — E119 Type 2 diabetes mellitus without complications: Secondary | ICD-10-CM | POA: Diagnosis not present

## 2015-11-15 DIAGNOSIS — J4530 Mild persistent asthma, uncomplicated: Secondary | ICD-10-CM | POA: Diagnosis not present

## 2015-11-15 DIAGNOSIS — J45909 Unspecified asthma, uncomplicated: Secondary | ICD-10-CM | POA: Insufficient documentation

## 2015-11-15 LAB — CBC WITH DIFFERENTIAL/PLATELET
HEMATOCRIT: 39.6 % (ref 34.0–46.6)
HEMOGLOBIN: 13.5 g/dL (ref 11.1–15.9)
LYMPHS ABS: 1.7 10*3/uL (ref 0.7–3.1)
LYMPHS: 31 %
MCH: 31.5 pg (ref 26.6–33.0)
MCHC: 34.1 g/dL (ref 31.5–35.7)
MCV: 92 fL (ref 79–97)
MID (ABSOLUTE): 0.6 10*3/uL (ref 0.1–1.6)
MID: 12 %
NEUTROS PCT: 57 %
Neutrophils Absolute: 3.1 10*3/uL (ref 1.4–7.0)
Platelets: 222 10*3/uL (ref 150–379)
RBC: 4.29 x10E6/uL (ref 3.77–5.28)
RDW: 12.8 % (ref 12.3–15.4)
WBC: 5.4 10*3/uL (ref 3.4–10.8)

## 2015-11-15 LAB — BAYER DCA HB A1C WAIVED: HB A1C: 5.9 % (ref <7.0)

## 2015-11-15 MED ORDER — ALBUTEROL SULFATE (2.5 MG/3ML) 0.083% IN NEBU: 2.5000 mg | INHALATION_SOLUTION | Freq: Once | RESPIRATORY_TRACT | Status: DC

## 2015-11-15 NOTE — Assessment & Plan Note (Signed)
Really good control of diabetes in spite of taking prednisone for 3 weeks we'll continue current care

## 2015-11-15 NOTE — Progress Notes (Signed)
BP 122/79 (BP Location: Left Arm, Patient Position: Sitting, Cuff Size: Normal)   Pulse 60   Temp 97.9 F (36.6 C)   Ht 4' 10.7" (1.491 m)   Wt 195 lb (88.5 kg)   SpO2 95%   BMI 39.79 kg/m    Subjective:    Patient ID: Rosita Fire, female    DOB: [birth year], 60 y.o.   MRN: [id number]  HPI: Nathalya Candelario is a 60 y.o. female  Chief Complaint  Patient presents with  . Bronchitis  Patient follow-up chronic sinusitis took antibiotics for 3 weeks for chronic sinusitis and got better but then had recurrence within 4 days. Is starting to feel bad again with wheezing and some respiratory distress wants to have an inhaler as feels she can't get a full breath. Sinuses are better but now but now coughing. Having trouble working out because of respiratory symptoms. Using her pro-air inhaler seems to help some. Has had a productive cough in the past but not right now. During the 3 weeks of taking antibiotic to prednisone which of course helped Did not check blood sugar during that time.  Patient also follow-up for diabetes is concerned because of ongoing weight gain.   Relevant past medical, surgical, family and social history reviewed and updated as indicated. Interim medical history since our last visit reviewed. Allergies and medications reviewed and updated.  Review of Systems  Constitutional: Positive for fatigue and fever.  HENT: Positive for congestion, sinus pressure, sore throat and voice change.   Respiratory: Positive for apnea, cough, chest tightness, shortness of breath and wheezing.   Cardiovascular: Negative.     Per HPI unless specifically indicated above     Objective:    BP 122/79 (BP Location: Left Arm, Patient Position: Sitting, Cuff Size: Normal)   Pulse 60   Temp 97.9 F (36.6 C)   Ht 4' 10.7" (1.491 m)   Wt 195 lb (88.5 kg)   SpO2 95%   BMI 39.79 kg/m   Wt Readings from Last 3 Encounters:  11/15/15 195 lb (88.5 kg)  09/19/15 193  lb (87.5 kg)  07/15/15 181 lb (82.1 kg)    Physical Exam  Constitutional: She is oriented to person, place, and time. She appears well-developed and well-nourished. No distress.  HENT:  Head: Normocephalic and atraumatic.  Right Ear: Hearing and external ear normal.  Left Ear: Hearing and external ear normal.  Nose: Nose normal.  Mouth/Throat: Oropharynx is clear and moist.  Eyes: Conjunctivae and lids are normal. Right eye exhibits no discharge. Left eye exhibits no discharge. No scleral icterus.  Neck: No thyromegaly present.  Cardiovascular: Normal rate, regular rhythm and normal heart sounds.   Pulmonary/Chest: Effort normal and breath sounds normal. No respiratory distress. She has no wheezes. She has no rales. She exhibits no tenderness.  Abdominal: Soft.  Musculoskeletal: Normal range of motion.  Lymphadenopathy:    She has no cervical adenopathy.  Neurological: She is alert and oriented to person, place, and time.  Skin: Skin is intact. No rash noted.  Psychiatric: She has a normal mood and affect. Her speech is normal and behavior is normal. Judgment and thought content normal. Cognition and memory are normal.    Results for orders placed or performed during the hospital encounter of 07/15/15  CBC  Result Value Ref Range   WBC 7.6 3.6 - 11.0 K/uL   RBC 4.63 3.80 - 5.20 MIL/uL   Hemoglobin 13.9 12.0 - 16.0 g/dL   HCT  41.4 35.0 - 47.0 %   MCV 89.4 80.0 - 100.0 fL   MCH 30.0 26.0 - 34.0 pg   MCHC 33.6 32.0 - 36.0 g/dL   RDW 16.1 09.6 - 04.5 %   Platelets 252 150 - 440 K/uL  Basic metabolic panel  Result Value Ref Range   Sodium 137 135 - 145 mmol/L   Potassium 3.7 3.5 - 5.1 mmol/L   Chloride 104 101 - 111 mmol/L   CO2 27 22 - 32 mmol/L   Glucose, Bld 123 (H) 65 - 99 mg/dL   BUN 12 6 - 20 mg/dL   Creatinine, Ser 4.09 0.44 - 1.00 mg/dL   Calcium 8.9 8.9 - 81.1 mg/dL   GFR calc non Af Amer >60 >60 mL/min   GFR calc Af Amer >60 >60 mL/min   Anion gap 6 5 - 15        Assessment & Plan:   Problem List Items Addressed This Visit      Cardiovascular and Mediastinum   Essential hypertension    The current medical regimen is effective;  continue present plan and medications.         Respiratory   Asthma   Relevant Medications   albuterol (PROVENTIL) (2.5 MG/3ML) 0.083% nebulizer solution 2.5 mg   Other Relevant Orders   CBC With Differential/Platelet   Ambulatory referral to Pulmonology     Endocrine   Diabetes mellitus without complication (HCC) - Primary    Really good control of diabetes in spite of taking prednisone for 3 weeks we'll continue current care      Relevant Orders   Bayer DCA Hb A1c Waived    Other Visit Diagnoses   None.      Follow up plan: Return in about 6 months (around 05/17/2016), or if symptoms worsen or fail to improve, for Physical Exam a1c.

## 2015-11-15 NOTE — Assessment & Plan Note (Signed)
The current medical regimen is effective;  continue present plan and medications.  

## 2015-12-04 ENCOUNTER — Other Ambulatory Visit: Payer: Self-pay | Admitting: Family Medicine

## 2015-12-04 NOTE — Telephone Encounter (Signed)
Routing to provider  

## 2015-12-12 ENCOUNTER — Ambulatory Visit: Payer: Managed Care, Other (non HMO) | Admitting: Family Medicine

## 2015-12-20 ENCOUNTER — Other Ambulatory Visit: Payer: Self-pay | Admitting: Specialist

## 2015-12-20 DIAGNOSIS — J8410 Pulmonary fibrosis, unspecified: Secondary | ICD-10-CM

## 2015-12-29 ENCOUNTER — Ambulatory Visit
Admission: RE | Admit: 2015-12-29 | Discharge: 2015-12-29 | Disposition: A | Discharge status: Home / Self Care | Payer: Managed Care, Other (non HMO) | Source: Ambulatory Visit | Attending: Specialist | Admitting: Specialist

## 2015-12-29 DIAGNOSIS — R918 Other nonspecific abnormal finding of lung field: Secondary | ICD-10-CM | POA: Diagnosis not present

## 2015-12-29 DIAGNOSIS — J8410 Pulmonary fibrosis, unspecified: Secondary | ICD-10-CM | POA: Diagnosis not present

## 2015-12-29 LAB — POCT I-STAT CREATININE: Creatinine, Ser: 0.8 mg/dL (ref 0.44–1.00)

## 2015-12-29 MED ORDER — IOPAMIDOL (ISOVUE-300) INJECTION 61%
75.0000 mL | Freq: Once | INTRAVENOUS | Status: AC | PRN
  Administered 2015-12-29: 75 mL via INTRAVENOUS

## 2016-01-01 ENCOUNTER — Other Ambulatory Visit: Payer: Self-pay | Admitting: Family Medicine

## 2016-01-10 ENCOUNTER — Encounter: Payer: Self-pay | Admitting: Family Medicine

## 2016-01-31 ENCOUNTER — Other Ambulatory Visit: Payer: Self-pay

## 2016-01-31 ENCOUNTER — Telehealth: Payer: Self-pay | Admitting: Family Medicine

## 2016-01-31 MED ORDER — DIAZEPAM 5 MG PO TABS
5.0000 mg | ORAL_TABLET | Freq: Every day | ORAL | 3 refills | Status: DC | PRN
Start: 2016-01-31 — End: 2016-06-12

## 2016-01-31 NOTE — Telephone Encounter (Signed)
It has been sent. Calling to make sure.

## 2016-01-31 NOTE — Telephone Encounter (Signed)
rx

## 2016-01-31 NOTE — Telephone Encounter (Signed)
Request for Diazepam.  Date last written 09/19/2015 with 3 refills.   Last visit 11/15/2015

## 2016-02-29 ENCOUNTER — Other Ambulatory Visit: Payer: Self-pay | Admitting: Family Medicine

## 2016-03-28 ENCOUNTER — Encounter: Payer: Managed Care, Other (non HMO) | Admitting: Family Medicine

## 2016-04-23 ENCOUNTER — Other Ambulatory Visit: Payer: Self-pay

## 2016-04-30 ENCOUNTER — Other Ambulatory Visit: Payer: Self-pay | Admitting: Family Medicine

## 2016-04-30 DIAGNOSIS — I10 Essential (primary) hypertension: Secondary | ICD-10-CM

## 2016-04-30 DIAGNOSIS — F329 Major depressive disorder, single episode, unspecified: Secondary | ICD-10-CM

## 2016-04-30 DIAGNOSIS — F32A Depression, unspecified: Secondary | ICD-10-CM

## 2016-04-30 DIAGNOSIS — K900 Celiac disease: Secondary | ICD-10-CM

## 2016-04-30 NOTE — Telephone Encounter (Signed)
She shouldn't be due- she got 3 refills in October, she should still have 1 refill at the pharmacy

## 2016-05-01 NOTE — Telephone Encounter (Signed)
Patient has appt tomorrow , will discuss then.

## 2016-05-02 ENCOUNTER — Ambulatory Visit (INDEPENDENT_AMBULATORY_CARE_PROVIDER_SITE_OTHER): Payer: Managed Care, Other (non HMO) | Admitting: Family Medicine

## 2016-05-02 VITALS — BP 147/88 | HR 80 | Ht 59.06 in | Wt 198.0 lb

## 2016-05-02 DIAGNOSIS — Z13220 Encounter for screening for lipoid disorders: Secondary | ICD-10-CM | POA: Diagnosis not present

## 2016-05-02 DIAGNOSIS — Z0001 Encounter for general adult medical examination with abnormal findings: Secondary | ICD-10-CM | POA: Diagnosis not present

## 2016-05-02 DIAGNOSIS — E119 Type 2 diabetes mellitus without complications: Secondary | ICD-10-CM | POA: Diagnosis not present

## 2016-05-02 DIAGNOSIS — F325 Major depressive disorder, single episode, in full remission: Secondary | ICD-10-CM | POA: Diagnosis not present

## 2016-05-02 DIAGNOSIS — Z1231 Encounter for screening mammogram for malignant neoplasm of breast: Secondary | ICD-10-CM

## 2016-05-02 DIAGNOSIS — Z1239 Encounter for other screening for malignant neoplasm of breast: Secondary | ICD-10-CM

## 2016-05-02 DIAGNOSIS — Z114 Encounter for screening for human immunodeficiency virus [HIV]: Secondary | ICD-10-CM

## 2016-05-02 DIAGNOSIS — Z1159 Encounter for screening for other viral diseases: Secondary | ICD-10-CM | POA: Diagnosis not present

## 2016-05-02 DIAGNOSIS — Z0000 Encounter for general adult medical examination without abnormal findings: Secondary | ICD-10-CM

## 2016-05-02 DIAGNOSIS — I10 Essential (primary) hypertension: Secondary | ICD-10-CM

## 2016-05-02 LAB — URINALYSIS, ROUTINE W REFLEX MICROSCOPIC
BILIRUBIN UA: NEGATIVE
Glucose, UA: NEGATIVE
KETONES UA: NEGATIVE
Leukocytes, UA: NEGATIVE
NITRITE UA: NEGATIVE
PH UA: 7 (ref 5.0–7.5)
PROTEIN UA: NEGATIVE
RBC UA: NEGATIVE
Specific Gravity, UA: 1.015 (ref 1.005–1.030)
Urobilinogen, Ur: 0.2 mg/dL (ref 0.2–1.0)

## 2016-05-02 LAB — MICROALBUMIN, URINE WAIVED
Creatinine, Urine Waived: 50 mg/dL (ref 10–300)
MICROALB, UR WAIVED: 10 mg/L (ref 0–19)

## 2016-05-02 LAB — BAYER DCA HB A1C WAIVED: HB A1C (BAYER DCA - WAIVED): 6 % (ref <7.0)

## 2016-05-02 MED ORDER — DIPHENOXYLATE-ATROPINE 2.5-0.025 MG PO TABS: 1.0000 | ORAL_TABLET | Freq: Four times a day (QID) | ORAL | 1 refills | Status: DC | PRN

## 2016-05-02 MED ORDER — HYOSCYAMINE SULFATE 0.125 MG PO TABS: 0.1250 mg | ORAL_TABLET | ORAL | 3 refills | Status: DC | PRN

## 2016-05-02 MED ORDER — CYCLOBENZAPRINE HCL 5 MG PO TABS: 5.0000 mg | ORAL_TABLET | Freq: Three times a day (TID) | ORAL | 1 refills | Status: DC | PRN

## 2016-05-02 NOTE — Assessment & Plan Note (Addendum)
With blood pressure will add Benzapril 40 mg 1 a day

## 2016-05-02 NOTE — Progress Notes (Signed)
BP (!) 147/88   Pulse 80   Ht 4' 11.06" (1.5 m)   Wt 198 lb (89.8 kg)   SpO2 98%   BMI 39.92 kg/m    Subjective:    Patient ID: [name], female    DOB: 10-20-1955, 61 y.o.   MRN: [id number]  HPI: [name] is a 61 y.o. female  Chief Complaint  Patient presents with  . Annual Exam   Patient all in all doing okay blood pressures up along with weight patient's stresses better diabetes stable along with along with bowels and cholesterol. She is concerned about inflammatory state wants sedimentation rate done and celiac's doing okay.   Relevant past medical, surgical, family and social history reviewed and updated as indicated. Interim medical history since our last visit reviewed. Allergies and medications reviewed and updated.  Review of Systems  Constitutional: Negative.   Respiratory: Negative.   Cardiovascular: Negative.     Per HPI unless specifically indicated above     Objective:    BP (!) 147/88   Pulse 80   Ht 4' 11.06" (1.5 m)   Wt 198 lb (89.8 kg)   SpO2 98%   BMI 39.92 kg/m   Wt Readings from Last 3 Encounters:  05/02/16 198 lb (89.8 kg)  11/15/15 195 lb (88.5 kg)  09/19/15 193 lb (87.5 kg)    Physical Exam  Constitutional: She is oriented to person, place, and time. She appears well-developed and well-nourished.  HENT:  Head: Normocephalic and atraumatic.  Right Ear: External ear normal.  Left Ear: External ear normal.  Nose: Nose normal.  Mouth/Throat: Oropharynx is clear and moist.  Eyes: Conjunctivae and EOM are normal. Pupils are equal, round, and reactive to light.  Neck: Normal range of motion. Neck supple. Carotid bruit is not present.  Cardiovascular: Normal rate, regular rhythm and normal heart sounds.   No murmur heard. Pulmonary/Chest: Effort normal and breath sounds normal. She exhibits no mass. Right breast exhibits no mass, no skin change and no tenderness. Left breast exhibits no mass, no skin change and  no tenderness. Breasts are symmetrical.  Abdominal: Soft. Bowel sounds are normal. There is no hepatosplenomegaly.  Musculoskeletal: Normal range of motion.  Neurological: She is alert and oriented to person, place, and time.  Skin: No rash noted.  Psychiatric: She has a normal mood and affect. Her behavior is normal. Judgment and thought content normal.    Results for orders placed or performed during the hospital encounter of 12/29/15  I-STAT creatinine  Result Value Ref Range   Creatinine, Ser 0.80 0.44 - 1.00 mg/dL      Assessment & Plan:   Problem List Items Addressed This Visit      Cardiovascular and Mediastinum   Essential hypertension    With blood pressure will add Benzapril 40 mg 1 a day      Relevant Orders   Bayer DCA Hb A1c Waived (STAT)     Endocrine   Diabetes mellitus without complication ([place])    Doing well with good control.      Relevant Orders   Bayer DCA Hb A1c Waived (STAT)   Microalbumin, Urine Waived     Other   Depression    The current medical regimen is effective;  continue present plan and medications.        Other Visit Diagnoses    Need for hepatitis C screening test    -  Primary   Relevant Orders   Hepatitis  C Antibody   Encounter for screening for HIV       Relevant Orders   HIV antibody   Annual physical exam       Relevant Orders   Hepatitis C Antibody   HIV antibody   Bayer DCA Hb A1c Waived (STAT)   TSH   Lipid panel   Urinalysis, Routine w reflex microscopic   Microalbumin, Urine Waived   Comprehensive metabolic panel   CBC with Differential/Platelet   Sed Rate (ESR)   Screening cholesterol level       Relevant Orders   Lipid panel   Breast cancer screening       Relevant Orders   MM DIGITAL SCREENING BILATERAL       Follow up plan: Return for BMP and BP check.

## 2016-05-02 NOTE — Assessment & Plan Note (Signed)
Doing well with good control 

## 2016-05-02 NOTE — Assessment & Plan Note (Signed)
The current medical regimen is effective;  continue present plan and medications.  

## 2016-05-03 ENCOUNTER — Telehealth: Payer: Self-pay | Admitting: Family Medicine

## 2016-05-03 LAB — CBC WITH DIFFERENTIAL/PLATELET
BASOS ABS: 0 10*3/uL (ref 0.0–0.2)
Basos: 1 %
EOS (ABSOLUTE): 0.3 10*3/uL (ref 0.0–0.4)
Eos: 5 %
HEMOGLOBIN: 13.5 g/dL (ref 11.1–15.9)
Hematocrit: 40.5 % (ref 34.0–46.6)
IMMATURE GRANULOCYTES: 0 %
Immature Grans (Abs): 0 10*3/uL (ref 0.0–0.1)
LYMPHS: 34 %
Lymphocytes Absolute: 2.1 10*3/uL (ref 0.7–3.1)
MCH: 29.9 pg (ref 26.6–33.0)
MCHC: 33.3 g/dL (ref 31.5–35.7)
MCV: 90 fL (ref 79–97)
MONOCYTES: 8 %
Monocytes Absolute: 0.5 10*3/uL (ref 0.1–0.9)
NEUTROS PCT: 52 %
Neutrophils Absolute: 3.2 10*3/uL (ref 1.4–7.0)
PLATELETS: 294 10*3/uL (ref 150–379)
RBC: 4.52 x10E6/uL (ref 3.77–5.28)
RDW: 13.8 % (ref 12.3–15.4)
WBC: 6.1 10*3/uL (ref 3.4–10.8)

## 2016-05-03 LAB — LIPID PANEL
CHOLESTEROL TOTAL: 187 mg/dL (ref 100–199)
Chol/HDL Ratio: 2.4 ratio units (ref 0.0–4.4)
HDL: 78 mg/dL (ref >39)
LDL CALC: 87 mg/dL (ref 0–99)
TRIGLYCERIDES: 109 mg/dL (ref 0–149)
VLDL Cholesterol Cal: 22 mg/dL (ref 5–40)

## 2016-05-03 LAB — COMPREHENSIVE METABOLIC PANEL
ALBUMIN: 4.4 g/dL (ref 3.6–4.8)
ALT: 18 IU/L (ref 0–32)
AST: 15 IU/L (ref 0–40)
Albumin/Globulin Ratio: 1.8 (ref 1.2–2.2)
Alkaline Phosphatase: 74 IU/L (ref 39–117)
BUN/Creatinine Ratio: 17 (ref 12–28)
BUN: 12 mg/dL (ref 8–27)
Bilirubin Total: 0.4 mg/dL (ref 0.0–1.2)
CHLORIDE: 98 mmol/L (ref 96–106)
CO2: 26 mmol/L (ref 18–29)
Calcium: 9.5 mg/dL (ref 8.7–10.3)
Creatinine, Ser: 0.69 mg/dL (ref 0.57–1.00)
GFR calc Af Amer: 109 mL/min/{1.73_m2} (ref >59)
GFR calc non Af Amer: 94 mL/min/{1.73_m2} (ref >59)
GLOBULIN, TOTAL: 2.5 g/dL (ref 1.5–4.5)
Glucose: 90 mg/dL (ref 65–99)
POTASSIUM: 3.9 mmol/L (ref 3.5–5.2)
Sodium: 142 mmol/L (ref 134–144)
TOTAL PROTEIN: 6.9 g/dL (ref 6.0–8.5)

## 2016-05-03 LAB — BASIC METABOLIC PANEL
BUN/Creatinine Ratio: 16 (ref 12–28)
BUN: 11 mg/dL (ref 8–27)
CALCIUM: 9.5 mg/dL (ref 8.7–10.3)
CHLORIDE: 100 mmol/L (ref 96–106)
CO2: 26 mmol/L (ref 18–29)
Creatinine, Ser: 0.68 mg/dL (ref 0.57–1.00)
GFR calc non Af Amer: 95 mL/min/{1.73_m2} (ref >59)
GFR, EST AFRICAN AMERICAN: 109 mL/min/{1.73_m2} (ref >59)
GLUCOSE: 79 mg/dL (ref 65–99)
POTASSIUM: 3.9 mmol/L (ref 3.5–5.2)
Sodium: 141 mmol/L (ref 134–144)

## 2016-05-03 LAB — HIV ANTIBODY (ROUTINE TESTING W REFLEX): HIV SCREEN 4TH GENERATION: NONREACTIVE

## 2016-05-03 LAB — TSH: TSH: 1.92 u[IU]/mL (ref 0.450–4.500)

## 2016-05-03 LAB — SEDIMENTATION RATE: SED RATE: 5 mm/h (ref 0–40)

## 2016-05-03 LAB — HEPATITIS C ANTIBODY

## 2016-05-03 NOTE — Telephone Encounter (Signed)
Routing to provider. I do not see in OV note where a new medication was supposed to be sent in. Were you planing to send in a new one?

## 2016-05-06 ENCOUNTER — Encounter: Payer: Self-pay | Admitting: Family Medicine

## 2016-05-06 MED ORDER — BENAZEPRIL HCL 40 MG PO TABS: 40.0000 mg | ORAL_TABLET | Freq: Every day | ORAL | 2 refills | Status: DC

## 2016-05-06 NOTE — Telephone Encounter (Signed)
done

## 2016-05-28 ENCOUNTER — Other Ambulatory Visit: Payer: Self-pay | Admitting: Family Medicine

## 2016-05-28 NOTE — Telephone Encounter (Signed)
Your patient 

## 2016-06-05 ENCOUNTER — Ambulatory Visit: Payer: Managed Care, Other (non HMO) | Admitting: Family Medicine

## 2016-06-11 ENCOUNTER — Ambulatory Visit: Payer: Managed Care, Other (non HMO) | Admitting: Family Medicine

## 2016-06-12 ENCOUNTER — Telehealth: Payer: Self-pay | Admitting: Family Medicine

## 2016-06-12 ENCOUNTER — Other Ambulatory Visit: Payer: Self-pay | Admitting: Family Medicine

## 2016-06-12 NOTE — Telephone Encounter (Signed)
Faxed patients script for Valium to [facility]

## 2016-06-12 NOTE — Telephone Encounter (Signed)
Last (acute) OV: 05/02/16 Last routine OV: 11/15/15 Next OV: None scheduled.    Last refilled in October 2017, with 3 refills.

## 2016-06-14 ENCOUNTER — Ambulatory Visit: Payer: Managed Care, Other (non HMO) | Admitting: Family Medicine

## 2016-06-27 ENCOUNTER — Other Ambulatory Visit: Payer: Self-pay | Admitting: Family Medicine

## 2016-06-27 NOTE — Telephone Encounter (Signed)
Routing to provider. No follow up on file. 

## 2016-07-26 ENCOUNTER — Ambulatory Visit
Admission: RE | Admit: 2016-07-26 | Discharge: 2016-07-26 | Disposition: A | Discharge status: Home / Self Care | Payer: Managed Care, Other (non HMO) | Source: Ambulatory Visit | Attending: Family Medicine | Admitting: Family Medicine

## 2016-07-26 DIAGNOSIS — Z1231 Encounter for screening mammogram for malignant neoplasm of breast: Secondary | ICD-10-CM | POA: Diagnosis present

## 2016-07-26 DIAGNOSIS — Z1239 Encounter for other screening for malignant neoplasm of breast: Secondary | ICD-10-CM

## 2016-07-27 ENCOUNTER — Other Ambulatory Visit: Payer: Self-pay | Admitting: Family Medicine

## 2016-07-29 ENCOUNTER — Other Ambulatory Visit: Payer: Self-pay | Admitting: Family Medicine

## 2016-07-29 NOTE — Telephone Encounter (Signed)
Last (acute) OV: 05/02/16 Last routine OV: 05/02/16 Next OV: None on file.    Last refilled 06/12/16 30 tablets, no refills.

## 2016-07-30 ENCOUNTER — Encounter: Payer: Self-pay | Admitting: Family Medicine

## 2016-07-30 ENCOUNTER — Other Ambulatory Visit: Payer: Self-pay | Admitting: Family Medicine

## 2016-07-30 ENCOUNTER — Ambulatory Visit (INDEPENDENT_AMBULATORY_CARE_PROVIDER_SITE_OTHER): Payer: 59 | Admitting: Family Medicine

## 2016-07-30 VITALS — BP 107/74 | HR 69 | Temp 98.4°F | Wt 196.0 lb

## 2016-07-30 DIAGNOSIS — J111 Influenza due to unidentified influenza virus with other respiratory manifestations: Secondary | ICD-10-CM

## 2016-07-30 LAB — VERITOR FLU A/B WAIVED
INFLUENZA A: NEGATIVE
Influenza B: NEGATIVE

## 2016-07-30 MED ORDER — OSELTAMIVIR PHOSPHATE 75 MG PO CAPS: 75.0000 mg | ORAL_CAPSULE | Freq: Two times a day (BID) | ORAL | 0 refills | Status: DC

## 2016-07-30 MED ORDER — DIAZEPAM 5 MG PO TABS: 5.0000 mg | ORAL_TABLET | Freq: Every day | ORAL | 0 refills | Status: DC | PRN

## 2016-07-30 MED ORDER — HYDROCOD POLST-CPM POLST ER 10-8 MG/5ML PO SUER: 5.0000 mL | Freq: Two times a day (BID) | ORAL | 0 refills | Status: DC | PRN

## 2016-07-30 MED ORDER — BENZONATATE 100 MG PO CAPS: 200.0000 mg | ORAL_CAPSULE | Freq: Three times a day (TID) | ORAL | 0 refills | Status: DC | PRN

## 2016-07-30 MED ORDER — PREDNISONE 10 MG PO TABS: ORAL_TABLET | ORAL | 0 refills | Status: DC

## 2016-07-30 NOTE — Progress Notes (Signed)
   BP 107/74   Pulse 69   Temp 98.4 F (36.9 C)   Wt 196 lb (88.9 kg)   SpO2 98%   BMI 39.51 kg/m    Subjective:    Patient ID: [name], female    DOB: [birth year], 61 y.o.   MRN: [id number]  HPI: [name] is a 61 y.o. female  Chief Complaint  Patient presents with  . URI    x 2 days, fever, body aches, productive cough, head/chest congestion, sore throat, runny nose.   Body aches, chills, sweats, rhinorrhea, productive cough, wheezing, SOB x 2 days. 101 fever this morning, took some tylenol with some relief. Denies CP, N/V/D, ear pain. Does have hx of asthma on multiple daily inhalers. Some relief with nebulizer tx this morning but still SOB. No sick contacts reported.    Relevant past medical, surgical, family and social history reviewed and updated as indicated. Interim medical history since our last visit reviewed. Allergies and medications reviewed and updated.  Review of Systems  Constitutional: Positive for chills, diaphoresis, fatigue and fever.  HENT: Positive for congestion, rhinorrhea and sore throat.   Eyes: Negative.   Respiratory: Positive for cough, shortness of breath and wheezing.   Cardiovascular: Negative.   Gastrointestinal: Negative.   Genitourinary: Negative.   Musculoskeletal: Positive for myalgias.  Neurological: Negative.   Psychiatric/Behavioral: Negative.     Per HPI unless specifically indicated above     Objective:    BP 107/74   Pulse 69   Temp 98.4 F (36.9 C)   Wt 196 lb (88.9 kg)   SpO2 98%   BMI 39.51 kg/m   Wt Readings from Last 3 Encounters:  07/30/16 196 lb (88.9 kg)  05/02/16 198 lb (89.8 kg)  11/15/15 195 lb (88.5 kg)    Physical Exam  Constitutional: She is oriented to person, place, and time. She appears well-developed and well-nourished. No distress.  HENT:  Head: Atraumatic.  Right Ear: External ear normal.  Left Ear: External ear normal.  Eyes: Conjunctivae are normal. Pupils are  equal, round, and reactive to light. No scleral icterus.  Neck: Normal range of motion. Neck supple.  Cardiovascular: Normal rate and normal heart sounds.   Pulmonary/Chest: Effort normal. No respiratory distress. She has wheezes (minimal wheezes diffusely).  Musculoskeletal: Normal range of motion.  Lymphadenopathy:    She has no cervical adenopathy.  Neurological: She is alert and oriented to person, place, and time.  Skin: Skin is warm and dry.  Psychiatric: She has a normal mood and affect. Her behavior is normal.  Nursing note and vitals reviewed.     Assessment & Plan:   Problem List Items Addressed This Visit    None    Visit Diagnoses    Influenza    -  Primary   Rapid flu neg, but classic sxs. Will treat with tamiflu, tussionex, and prednisone burst. Continue inhalers prn, add mucinex. F/u if no improvement   Relevant Orders   Influenza A & B (STAT) (Completed)       Follow up plan: Return if symptoms worsen or fail to improve.

## 2016-07-31 ENCOUNTER — Encounter: Payer: Self-pay | Admitting: Family Medicine

## 2016-08-01 NOTE — Patient Instructions (Signed)
Follow up as needed

## 2016-08-07 ENCOUNTER — Encounter: Payer: Self-pay | Admitting: Family Medicine

## 2016-08-08 ENCOUNTER — Telehealth: Payer: Self-pay | Admitting: Family Medicine

## 2016-08-08 ENCOUNTER — Ambulatory Visit (INDEPENDENT_AMBULATORY_CARE_PROVIDER_SITE_OTHER): Payer: 59 | Admitting: Family Medicine

## 2016-08-08 VITALS — BP 146/85 | HR 92 | Ht 60.0 in | Wt 199.0 lb

## 2016-08-08 DIAGNOSIS — F321 Major depressive disorder, single episode, moderate: Secondary | ICD-10-CM

## 2016-08-08 MED ORDER — BENAZEPRIL HCL 20 MG PO TABS: 20.0000 mg | ORAL_TABLET | Freq: Every day | ORAL | 3 refills | Status: DC

## 2016-08-08 MED ORDER — VORTIOXETINE HBR 10 MG PO TABS: 10.0000 mg | ORAL_TABLET | Freq: Every day | ORAL | 3 refills | Status: DC

## 2016-08-08 NOTE — Progress Notes (Signed)
   BP (!) 146/85   Pulse 92   Ht 5' (1.524 m)   Wt 199 lb (90.3 kg)   SpO2 98%   BMI 38.86 kg/m    Subjective:    Patient ID: [name], female    DOB: [birth year], 61 y.o.   MRN: [id number]  HPI: [name] is a 61 y.o. female presenting with new onset anxiety and mood issues x 2 months. Says she just doesn't feel like herself, is constantly on edge, has no patience and a short temper, frequent crying spells, and difficulty waking up in the morning despite a good night's sleep. Has not been exercising as frequently as she usually does. Her children and boyfriend have voiced that she is not acting like herself. She reports no recent stressors in her life or changes at home or at work. Denies suicidal ideation or thoughts of hurting herself or others. Depression has been managed long term on 450 mg Wellbutrin. Has failed Prozac in the past. States she is currently taking approx. 15 PRN valium/month.   Chief Complaint  Patient presents with  . Emotional   Relevant past medical, surgical, family and social history reviewed and updated as indicated. Interim medical history since our last visit reviewed. Allergies and medications reviewed and updated.  Review of Systems  Constitutional: Negative for chills and fever.  HENT: Negative.   Eyes: Negative.   Respiratory: Negative.   Cardiovascular: Negative.   Gastrointestinal: Negative.   Endocrine: Negative.   Genitourinary: Negative.   Musculoskeletal: Positive for arthralgias and myalgias.  Allergic/Immunologic: Negative.   Neurological: Negative.   Hematological: Negative.   Psychiatric/Behavioral: Positive for dysphoric mood. Negative for self-injury and suicidal ideas. The patient is nervous/anxious.     Per HPI unless specifically indicated above     Objective:    BP (!) 146/85   Pulse 92   Ht 5' (1.524 m)   Wt 199 lb (90.3 kg)   SpO2 98%   BMI 38.86 kg/m   Wt Readings from Last 3 Encounters:    08/08/16 199 lb (90.3 kg)  07/30/16 196 lb (88.9 kg)  05/02/16 198 lb (89.8 kg)    Physical Exam  Constitutional: She is oriented to person, place, and time. She appears well-developed and well-nourished.  HENT:  Head: Atraumatic.  Eyes: Conjunctivae are normal. Pupils are equal, round, and reactive to light.  Neck: Normal range of motion. Neck supple.  Cardiovascular: Normal rate and normal heart sounds.   Pulmonary/Chest: Effort normal and breath sounds normal. No respiratory distress.  Musculoskeletal: Normal range of motion.  Neurological: She is alert and oriented to person, place, and time.  Skin: Skin is warm and dry.  Psychiatric: Her behavior is normal. Judgment normal.  tearful  Nursing note and vitals reviewed.     Assessment & Plan:   Problem List Items Addressed This Visit      Other   Depression - Primary    Slow taper off wellbutrin discussed, will try Trintellix as she has not had good results with other SSRIs in the past. Offered counseling services, but pt does not feel this would be helpful to her. Discussed increasing exercise, maintaining good diet, and reducing modifiable life stressors. Continue valium only when absolutely necessary.       Relevant Medications   vortioxetine HBr (TRINTELLIX) 10 MG TABS       Follow up plan: Return in about 6 weeks (around 09/19/2016).

## 2016-08-08 NOTE — Patient Instructions (Signed)
Take away 1 of the 150 mg wellbutrin tablets daily each week, so first week only take twice daily, then second week take once daily.

## 2016-08-08 NOTE — Assessment & Plan Note (Signed)
Slow taper off wellbutrin discussed, will try Trintellix as she has not had good results with other SSRIs in the past. Offered counseling services, but pt does not feel this would be helpful to her. Discussed increasing exercise, maintaining good diet, and reducing modifiable life stressors. Continue valium only when absolutely necessary.

## 2016-08-08 NOTE — Telephone Encounter (Signed)
Discussed this with pt during visit, we will be working on the prior authorization while she tapers off her Wellbutrin slowly. The other options were discussed during the visit and pt declined them d/t risk of weight gain

## 2016-08-08 NOTE — Telephone Encounter (Signed)
Pt called and stated that the new medication would require a prior auth and would like to know if there is anything else she can take.

## 2016-08-08 NOTE — Telephone Encounter (Signed)
LMOM

## 2016-08-12 ENCOUNTER — Encounter: Payer: Self-pay | Admitting: Family Medicine

## 2016-08-26 ENCOUNTER — Other Ambulatory Visit: Payer: Self-pay | Admitting: Family Medicine

## 2016-08-28 ENCOUNTER — Ambulatory Visit (INDEPENDENT_AMBULATORY_CARE_PROVIDER_SITE_OTHER): Payer: 59 | Admitting: Family Medicine

## 2016-08-28 ENCOUNTER — Encounter: Payer: Self-pay | Admitting: Family Medicine

## 2016-08-28 VITALS — BP 132/86 | HR 91 | Temp 98.6°F | Wt 196.0 lb

## 2016-08-28 DIAGNOSIS — F321 Major depressive disorder, single episode, moderate: Secondary | ICD-10-CM

## 2016-08-28 DIAGNOSIS — J4540 Moderate persistent asthma, uncomplicated: Secondary | ICD-10-CM | POA: Diagnosis not present

## 2016-08-28 MED ORDER — IPRATROPIUM-ALBUTEROL 0.5-2.5 (3) MG/3ML IN SOLN: 3.0000 mL | Freq: Four times a day (QID) | RESPIRATORY_TRACT | 2 refills | Status: DC | PRN

## 2016-08-28 MED ORDER — CETIRIZINE HCL 10 MG PO TABS: 10.0000 mg | ORAL_TABLET | Freq: Every day | ORAL | 11 refills | Status: DC

## 2016-08-28 MED ORDER — ALBUTEROL SULFATE HFA 108 (90 BASE) MCG/ACT IN AERS: 2.0000 | INHALATION_SPRAY | Freq: Four times a day (QID) | RESPIRATORY_TRACT | 11 refills | Status: DC | PRN

## 2016-08-28 NOTE — Progress Notes (Signed)
BP 132/86   Pulse 91   Temp 98.6 F (37 C)   Wt 196 lb (88.9 kg)   SpO2 98%   BMI 38.28 kg/m    Subjective:    Patient ID: [name], female    DOB: November 08, 1955, 61 y.o.   MRN: [id number]  HPI: [name] is a 61 y.o. female presenting today for f/u on depression and anxiety (started on trintellix 10 mg 08/08/16) and a new concern of worsening asthma.  She is doing very well on her new regimen of trintellix, but was not able to completely taper off her wellbutrin, stating she "just didn't feel right" when dropping to 1 daily dose. She is currently taking 150 mg BID. Her mood and anxiety are significantly improved, she is sleeping well, and exercising 4 days per week with cardio and a personal trainer. Has not needed valium since time of her last visit, which is a huge improvement from previously (approx. 15 doses/month).   She has been been having trouble with worsening asthma for a couple of months now, thought it was seasonal but states it is not improving. She reports coughing (wet but not productive), wheezing, and SOB both at rest and with exertion. Cough is waking her up at night. Denies leg swelling, orthopnea, and hemoptysis. Takes singulair and flonase daily for allergies, using her symbicort and spiriva faithfully. Reports 2 albuterol nebulizer treatments daily as well as rescue inhaler 2-3 x/day. Not currently being followed by pulmonology.   Chief Complaint  Patient presents with  . Depression    doing better, has questions about tapering off the wellbutrin  . Asthma    not doing well lately   Past Medical History:  Diagnosis Date  . Asthma   . Depression   . Diabetes mellitus without complication (HCC)   . Hyperlipidemia   . Kidney stones   . Migraine headache    Social History   Social History  . Marital status: Legally Separated    Spouse name: N/A  . Number of children: N/A  . Years of education: N/A   Occupational History  . Not on  file.   Social History Main Topics  . Smoking status: Never Smoker  . Smokeless tobacco: Never Used  . Alcohol use Yes  . Drug use: No  . Sexual activity: Not on file   Other Topics Concern  . Not on file   Social History Narrative  . No narrative on file    Relevant past medical, surgical, family and social history reviewed and updated as indicated. Interim medical history since our last visit reviewed. Allergies and medications reviewed and updated.  Review of Systems  Constitutional: Negative.   HENT: Negative.   Eyes: Negative.   Respiratory: Positive for cough, shortness of breath and wheezing.   Cardiovascular: Negative.   Gastrointestinal: Negative.   Endocrine: Negative.   Genitourinary: Negative.   Musculoskeletal: Negative.   Allergic/Immunologic: Negative.   Neurological: Negative.   Hematological: Negative.   Psychiatric/Behavioral: Negative.     Per HPI unless specifically indicated above     Objective:    BP 132/86   Pulse 91   Temp 98.6 F (37 C)   Wt 196 lb (88.9 kg)   SpO2 98%   BMI 38.28 kg/m   Wt Readings from Last 3 Encounters:  08/28/16 196 lb (88.9 kg)  08/08/16 199 lb (90.3 kg)  07/30/16 196 lb (88.9 kg)    Physical Exam  Constitutional: She is  oriented to person, place, and time. She appears well-developed and well-nourished. No distress.  HENT:  Head: Normocephalic and atraumatic.  Eyes: Conjunctivae are normal. Right eye exhibits no discharge. Left eye exhibits no discharge. No scleral icterus.  Neck: Normal range of motion.  Cardiovascular: Normal rate and regular rhythm.  Exam reveals no gallop and no friction rub.   No murmur heard. Pulmonary/Chest: Effort normal and breath sounds normal. No respiratory distress. She has no wheezes. She has no rales. She exhibits no tenderness.  Neurological: She is alert and oriented to person, place, and time.  Skin: Skin is warm and dry. She is not diaphoretic.  Psychiatric: She has a  normal mood and affect. Her behavior is normal. Judgment and thought content normal.      Assessment & Plan:   Problem List Items Addressed This Visit      Respiratory   Asthma    Continue current inhaler regimen, add tessalon perles and zyrtec. F/u with Pulmonology if no improvement      Relevant Medications   ipratropium-albuterol (DUONEB) 0.5-2.5 (3) MG/3ML SOLN   albuterol (PROAIR HFA) 108 (90 Base) MCG/ACT inhaler     Other   Depression - Primary    Given pt's difficulty fully coming off wellbutrin, will keep her at 150 mg BID and cut trintellix back to 5 mg QD. Pt to cut tabs in half for this.           Follow up plan: Return in about 4 weeks (around 09/25/2016) for Depression f/u.

## 2016-09-03 ENCOUNTER — Encounter: Payer: Self-pay | Admitting: Family Medicine

## 2016-09-05 NOTE — Assessment & Plan Note (Signed)
Continue current inhaler regimen, add tessalon perles and zyrtec. F/u with Pulmonology if no improvement

## 2016-09-05 NOTE — Patient Instructions (Signed)
Follow up in 1 month   

## 2016-09-05 NOTE — Assessment & Plan Note (Signed)
Given pt's difficulty fully coming off wellbutrin, will keep her at 150 mg BID and cut trintellix back to 5 mg QD. Pt to cut tabs in half for this.

## 2016-09-25 ENCOUNTER — Other Ambulatory Visit: Payer: Self-pay | Admitting: Family Medicine

## 2016-09-25 DIAGNOSIS — K900 Celiac disease: Secondary | ICD-10-CM

## 2016-09-25 DIAGNOSIS — I10 Essential (primary) hypertension: Secondary | ICD-10-CM

## 2016-09-25 DIAGNOSIS — F32A Depression, unspecified: Secondary | ICD-10-CM

## 2016-09-25 DIAGNOSIS — F329 Major depressive disorder, single episode, unspecified: Secondary | ICD-10-CM

## 2016-09-25 NOTE — Telephone Encounter (Signed)
Your patient 

## 2016-09-25 NOTE — Telephone Encounter (Signed)
Last (acute) OV: 08/28/16 Last routine OV: 05/02/16 Next OV: 09/26/16

## 2016-09-26 ENCOUNTER — Encounter: Payer: Self-pay | Admitting: Family Medicine

## 2016-09-26 ENCOUNTER — Ambulatory Visit (INDEPENDENT_AMBULATORY_CARE_PROVIDER_SITE_OTHER): Payer: 59 | Admitting: Family Medicine

## 2016-09-26 VITALS — BP 130/62 | HR 81 | Wt 203.0 lb

## 2016-09-26 DIAGNOSIS — R531 Weakness: Secondary | ICD-10-CM | POA: Diagnosis not present

## 2016-09-26 DIAGNOSIS — I10 Essential (primary) hypertension: Secondary | ICD-10-CM

## 2016-09-26 DIAGNOSIS — E119 Type 2 diabetes mellitus without complications: Secondary | ICD-10-CM

## 2016-09-26 DIAGNOSIS — F321 Major depressive disorder, single episode, moderate: Secondary | ICD-10-CM | POA: Diagnosis not present

## 2016-09-26 NOTE — Addendum Note (Signed)
Addended byVonita Moss: Maan Zarcone on: 09/26/2016 02:34 PM   Modules accepted: Orders

## 2016-09-26 NOTE — Assessment & Plan Note (Signed)
Concerned about high risk of threat to life or bodily function with abrupt change in neurologic status. Patient may have had a stroke which caused this type symptoms. Reviewed risk status calling 911 patient to take an aspirin a day. Reviewed EKG which was normal sinus rhythm had changes of inferior infarct but feel that was more body habitus changes than patient's medical status. Because of stroke risk will check blood panel. Will check CT scan. Workup pending results of these tests. Discuss worsening signs or symptoms or new signs or symptoms to emergency room.

## 2016-09-26 NOTE — Assessment & Plan Note (Signed)
Great deal of compounding factors which are improving this week with her son moving out of the house will gradually stop Trintellix and go back on Wellbutrin as patient was doing better. Will gradually increase dose.

## 2016-09-26 NOTE — Assessment & Plan Note (Signed)
The current medical regimen is effective;  continue present plan and medications.  

## 2016-09-26 NOTE — Assessment & Plan Note (Signed)
Still stable 

## 2016-09-26 NOTE — Progress Notes (Signed)
BP 130/62   Pulse 81   Wt 203 lb (92.1 kg)   SpO2 99%   BMI 39.65 kg/m    Subjective:    Patient ID: [name], female    DOB: Sep 06, 1955, 61 y.o.   MRN: [id number]  HPI: [name] is a 61 y.o. female  Chief Complaint  Patient presents with  . Follow-up  . Depression  Right-sided weakness Patient developing right-sided weakness about a week ago. Patient considered going to the emergency room with this onset and due to a lot of home issues did not go. This weakness has persisted. This is demonstrated with difficulty climbing steps with weakness of the right side right leg and balance issues. Patient also noted right hand coordination and control issues could hold a small glass of water but would drop anything heavier. Is able to control the fork for eating but otherwise difficult control of her hand. This hasn't gotten worse but hasn't really improved over this last week. Patient with no visual symptoms speech difficulties facial droop or other strokelike symptoms.  Depression patient's major source of stress his been her son living in the house which is coming to an end this week. Had been on Wellbutrin 150 in the morning and 300 in the evening was doing okay but a lot of anxiety issues as a patient was gradually transitioned off Wellbutrin thinking may be contributing to anxiety and started Trintellix 10 mg. Cautions about Trintellix dosing and will return together. But tapering Wellbutrin patient developed a lot of anxiety side effects which are difficult to sort out because of high stress environment at home. Patient now on Trintellix 5 mg and no Wellbutrin just not doing well. In retrospect a lot of patient's symptoms were probably anxiety related to her son being in the house and not depression.  Relevant past medical, surgical, family and social history reviewed and updated as indicated. Interim medical history since our last visit reviewed. Allergies and  medications reviewed and updated.  Review of Systems  Constitutional: Negative.   HENT: Negative.   Eyes: Negative.   Respiratory: Negative.   Cardiovascular: Negative.   Gastrointestinal: Negative.   Endocrine: Negative.   Genitourinary: Negative.   Musculoskeletal: Negative.   Skin: Negative.   Allergic/Immunologic: Negative.   Neurological: Negative.   Hematological: Negative.   Psychiatric/Behavioral: Negative.     Per HPI unless specifically indicated above     Objective:    BP 130/62   Pulse 81   Wt 203 lb (92.1 kg)   SpO2 99%   BMI 39.65 kg/m   Wt Readings from Last 3 Encounters:  09/26/16 203 lb (92.1 kg)  08/28/16 196 lb (88.9 kg)  08/08/16 199 lb (90.3 kg)    Physical Exam  Constitutional: She is oriented to person, place, and time. She appears well-developed and well-nourished. No distress.  HENT:  Head: Normocephalic and atraumatic.  Right Ear: External ear normal.  Left Ear: External ear normal.  Mouth/Throat: Oropharynx is clear and moist.  Eyes: Conjunctivae and EOM are normal. Pupils are equal, round, and reactive to light.  Neck: Normal range of motion. No tracheal deviation present. No thyromegaly present.  Cardiovascular: Normal rate, regular rhythm and normal heart sounds.   No murmur heard. Pulmonary/Chest: Effort normal and breath sounds normal. No respiratory distress. She has no wheezes. She has no rales. She exhibits no tenderness.  Abdominal: Soft. Bowel sounds are normal. She exhibits no distension. There is no tenderness. There is no  rebound.  Musculoskeletal: Normal range of motion. She exhibits deformity. She exhibits no edema or tenderness.  Lymphadenopathy:    She has no cervical adenopathy.  Neurological: She is alert and oriented to person, place, and time. She has normal reflexes. She displays normal reflexes. No cranial nerve deficit. She exhibits normal muscle tone. Coordination normal.  Skin: Skin is warm and dry. She is not  diaphoretic. No erythema.  Psychiatric: She has a normal mood and affect. Her behavior is normal. Judgment and thought content normal.        Assessment & Plan:   Problem List Items Addressed This Visit      Cardiovascular and Mediastinum   Essential hypertension    The current medical regimen is effective;  continue present plan and medications.         Endocrine   Diabetes mellitus without complication (HCC)    Still stable        Nervous and Auditory   Right sided weakness    Concerned about high risk of threat to life or bodily function with abrupt change in neurologic status. Patient may have had a stroke which caused this type symptoms. Reviewed risk status calling 911 patient to take an aspirin a day. Reviewed EKG which was normal sinus rhythm had changes of inferior infarct but feel that was more body habitus changes than patient's medical status. Because of stroke risk will check blood panel. Will check CT scan. Workup pending results of these tests. Discuss worsening signs or symptoms or new signs or symptoms to emergency room.      Relevant Orders   EKG 12-Lead (Completed)   Comprehensive metabolic panel   CBC with Differential/Platelet   TSH   CT Head Wo Contrast     Other   Depression - Primary    Great deal of compounding factors which are improving this week with her son moving out of the house will gradually stop Trintellix and go back on Wellbutrin as patient was doing better. Will gradually increase dose.      Relevant Orders   Comprehensive metabolic panel   CBC with Differential/Platelet   TSH       Follow up plan: Return in about 2 weeks (around 10/10/2016).

## 2016-09-27 LAB — SPECIMEN STATUS

## 2016-09-27 LAB — CBC WITH DIFFERENTIAL/PLATELET
BASOS: 0 %
Basophils Absolute: 0 10*3/uL (ref 0.0–0.2)
EOS (ABSOLUTE): 0.1 10*3/uL (ref 0.0–0.4)
Eos: 2 %
HEMATOCRIT: 39.9 % (ref 34.0–46.6)
Hemoglobin: 13.3 g/dL (ref 11.1–15.9)
IMMATURE GRANULOCYTES: 0 %
Immature Grans (Abs): 0 10*3/uL (ref 0.0–0.1)
LYMPHS ABS: 2.1 10*3/uL (ref 0.7–3.1)
Lymphs: 33 %
MCH: 30.3 pg (ref 26.6–33.0)
MCHC: 33.3 g/dL (ref 31.5–35.7)
MCV: 91 fL (ref 79–97)
MONOS ABS: 0.4 10*3/uL (ref 0.1–0.9)
Monocytes: 6 %
NEUTROS PCT: 59 %
Neutrophils Absolute: 3.7 10*3/uL (ref 1.4–7.0)
PLATELETS: 274 10*3/uL (ref 150–379)
RBC: 4.39 x10E6/uL (ref 3.77–5.28)
RDW: 12.9 % (ref 12.3–15.4)
WBC: 6.3 10*3/uL (ref 3.4–10.8)

## 2016-09-27 LAB — COMPREHENSIVE METABOLIC PANEL
A/G RATIO: 1.7 (ref 1.2–2.2)
ALBUMIN: 4.1 g/dL (ref 3.6–4.8)
ALK PHOS: 66 IU/L (ref 39–117)
ALT: 19 IU/L (ref 0–32)
AST: 15 IU/L (ref 0–40)
BUN / CREAT RATIO: 19 (ref 12–28)
BUN: 12 mg/dL (ref 8–27)
Bilirubin Total: 0.7 mg/dL (ref 0.0–1.2)
CALCIUM: 9.2 mg/dL (ref 8.7–10.3)
CO2: 25 mmol/L (ref 18–29)
Chloride: 101 mmol/L (ref 96–106)
Creatinine, Ser: 0.63 mg/dL (ref 0.57–1.00)
GFR calc Af Amer: 112 mL/min/{1.73_m2} (ref >59)
GFR, EST NON AFRICAN AMERICAN: 97 mL/min/{1.73_m2} (ref >59)
Globulin, Total: 2.4 g/dL (ref 1.5–4.5)
Glucose: 173 mg/dL — ABNORMAL HIGH (ref 65–99)
POTASSIUM: 4.1 mmol/L (ref 3.5–5.2)
SODIUM: 142 mmol/L (ref 134–144)
Total Protein: 6.5 g/dL (ref 6.0–8.5)

## 2016-09-27 LAB — SPECIMEN STATUS REPORT

## 2016-09-27 LAB — TSH: TSH: 1.45 u[IU]/mL (ref 0.450–4.500)

## 2016-09-28 ENCOUNTER — Encounter: Payer: Self-pay | Admitting: Family Medicine

## 2016-09-30 ENCOUNTER — Telehealth: Payer: Self-pay

## 2016-09-30 DIAGNOSIS — E119 Type 2 diabetes mellitus without complications: Secondary | ICD-10-CM

## 2016-09-30 DIAGNOSIS — I10 Essential (primary) hypertension: Secondary | ICD-10-CM

## 2016-09-30 LAB — SPECIMEN STATUS REPORT

## 2016-09-30 NOTE — Telephone Encounter (Signed)
[facility] called stated we ordered wrong A1C. Spoke with inhouse lab, they accidentally sent it out instead of running in office, which is why it is the wrong lab. Will reorder as outside run A1C.  New order was placed. Trey PaulaJeff from lab stated he would make sure it was taken care of

## 2016-10-01 ENCOUNTER — Ambulatory Visit
Admission: RE | Admit: 2016-10-01 | Discharge: 2016-10-01 | Disposition: A | Discharge status: Home / Self Care | Payer: 59 | Source: Ambulatory Visit | Attending: Family Medicine | Admitting: Family Medicine

## 2016-10-01 ENCOUNTER — Other Ambulatory Visit: Payer: Self-pay | Admitting: Family Medicine

## 2016-10-01 DIAGNOSIS — M278 Other specified diseases of jaws: Secondary | ICD-10-CM | POA: Insufficient documentation

## 2016-10-01 DIAGNOSIS — G319 Degenerative disease of nervous system, unspecified: Secondary | ICD-10-CM | POA: Insufficient documentation

## 2016-10-01 DIAGNOSIS — R531 Weakness: Secondary | ICD-10-CM | POA: Insufficient documentation

## 2016-10-01 MED ORDER — AZITHROMYCIN 250 MG PO TABS: ORAL_TABLET | ORAL | 0 refills | Status: DC

## 2016-10-01 NOTE — Telephone Encounter (Signed)
Phone call Discussed with patient CT scan no stroke symptoms showing some sinus infection will treat with a Z-Pak.

## 2016-10-02 LAB — SPECIMEN STATUS REPORT

## 2016-10-02 LAB — HGB A1C W/O EAG: HEMOGLOBIN A1C: 6.1 % — AB (ref 4.8–5.6)

## 2016-10-07 ENCOUNTER — Encounter: Payer: Self-pay | Admitting: Family Medicine

## 2016-10-14 ENCOUNTER — Encounter: Payer: Self-pay | Admitting: Family Medicine

## 2016-10-16 ENCOUNTER — Encounter: Payer: Self-pay | Admitting: Family Medicine

## 2016-10-16 NOTE — Telephone Encounter (Signed)
Patient has been having issues with a cough.  She has been seen at Urgent Care for this and they say her lungs are clear but think it may be related to the BP medication.She can be reached at [phone].

## 2016-10-17 ENCOUNTER — Other Ambulatory Visit: Payer: Self-pay | Admitting: Family Medicine

## 2016-10-17 MED ORDER — LOSARTAN POTASSIUM 100 MG PO TABS: 100.0000 mg | ORAL_TABLET | Freq: Every day | ORAL | 1 refills | Status: DC

## 2016-10-17 NOTE — Telephone Encounter (Signed)
Patient called this morning upset that she has not received any response from her MyChart messages.  She states she needs to know what she needs to do.  Please advise. Thanks  [phone]

## 2016-11-16 ENCOUNTER — Other Ambulatory Visit: Payer: Self-pay | Admitting: Family Medicine

## 2016-11-18 ENCOUNTER — Other Ambulatory Visit: Payer: Self-pay | Admitting: Family Medicine

## 2016-11-18 MED ORDER — DIAZEPAM 5 MG PO TABS: 5.0000 mg | ORAL_TABLET | Freq: Every day | ORAL | 1 refills | Status: DC | PRN

## 2016-11-18 NOTE — Telephone Encounter (Signed)
  Last routine OV: 09/26/16 Next ZO:[id number]:None on file.

## 2016-11-18 NOTE — Telephone Encounter (Signed)
ok 

## 2016-12-04 ENCOUNTER — Encounter: Payer: Self-pay | Admitting: Family Medicine

## 2016-12-04 ENCOUNTER — Ambulatory Visit (INDEPENDENT_AMBULATORY_CARE_PROVIDER_SITE_OTHER): Payer: 59 | Admitting: Family Medicine

## 2016-12-04 VITALS — BP 129/77 | HR 85 | Temp 98.2°F | Wt 202.0 lb

## 2016-12-04 DIAGNOSIS — I10 Essential (primary) hypertension: Secondary | ICD-10-CM | POA: Diagnosis not present

## 2016-12-04 DIAGNOSIS — R002 Palpitations: Secondary | ICD-10-CM

## 2016-12-04 DIAGNOSIS — R5383 Other fatigue: Secondary | ICD-10-CM

## 2016-12-04 NOTE — Patient Instructions (Signed)
90s-100s/50s-60s is considered low

## 2016-12-04 NOTE — Progress Notes (Signed)
BP 129/77   Pulse 85   Temp 98.2 F (36.8 C)   Wt 202 lb (91.6 kg)   SpO2 98%   BMI 39.45 kg/m    Subjective:    Patient ID: [name], female    DOB: 16-Dec-1955, 61 y.o.   MRN: [id number]  HPI: [name] is a 61 y.o. female  Chief Complaint  Patient presents with  . Hypotension    x 1 week, checked at home and got the following readings: 103/64, 101/53, 104/56. Just feels bad and fatigued.   . Medication Refill    She needs a refill on Lomotil   Patient presents with 1 week of hypotension. Stress levels have reduced lately, and over the past month she's been diligent about diet and increasing exercise. Home readings this week have been 100/50s-60s range and she's been fatigued, having palpitations, and just feeling "blah". Denies dizziness, syncope, CP, SOB. Tried cutting losartan in half to 50 mg and was getting 110-120/60s-70s.   Needing a refill on her lomotil. Doing well, no concerns.   Relevant past medical, surgical, family and social history reviewed and updated as indicated. Interim medical history since our last visit reviewed. Allergies and medications reviewed and updated.  Review of Systems  Constitutional: Positive for fatigue.  HENT: Negative.   Respiratory: Negative.   Cardiovascular: Negative.   Gastrointestinal: Negative.   Genitourinary: Negative.   Musculoskeletal: Negative.   Neurological: Positive for light-headedness.  Hematological: Negative.   Psychiatric/Behavioral: Negative.    Per HPI unless specifically indicated above     Objective:    BP 129/77   Pulse 85   Temp 98.2 F (36.8 C)   Wt 202 lb (91.6 kg)   SpO2 98%   BMI 39.45 kg/m   Wt Readings from Last 3 Encounters:  12/04/16 202 lb (91.6 kg)  09/26/16 203 lb (92.1 kg)  08/28/16 196 lb (88.9 kg)    Physical Exam  Constitutional: She is oriented to person, place, and time. She appears well-developed and well-nourished. No distress.  HENT:  Head:  Atraumatic.  Eyes: Pupils are equal, round, and reactive to light. Conjunctivae are normal.  Neck: Normal range of motion. Neck supple.  Cardiovascular: Normal rate and normal heart sounds.   Pulmonary/Chest: Effort normal and breath sounds normal.  Musculoskeletal: Normal range of motion.  Neurological: She is alert and oriented to person, place, and time.  Skin: Skin is warm and dry.  Psychiatric: She has a normal mood and affect. Her behavior is normal.  Nursing note and vitals reviewed.  Results for orders placed or performed in visit on 12/04/16  Comprehensive metabolic panel  Result Value Ref Range   Glucose 115 (H) 65 - 99 mg/dL   BUN 18 8 - 27 mg/dL   Creatinine, Ser 4.09 0.57 - 1.00 mg/dL   GFR calc non Af Amer 70 >59 mL/min/1.73   GFR calc Af Amer 80 >59 mL/min/1.73   BUN/Creatinine Ratio 20 12 - 28   Sodium 143 134 - 144 mmol/L   Potassium 3.8 3.5 - 5.2 mmol/L   Chloride 103 96 - 106 mmol/L   CO2 24 20 - 29 mmol/L   Calcium 9.1 8.7 - 10.3 mg/dL   Total Protein 6.4 6.0 - 8.5 g/dL   Albumin 4.2 3.6 - 4.8 g/dL   Globulin, Total 2.2 1.5 - 4.5 g/dL   Albumin/Globulin Ratio 1.9 1.2 - 2.2   Bilirubin Total 0.3 0.0 - 1.2 mg/dL   Alkaline Phosphatase  71 39 - 117 IU/L   AST 17 0 - 40 IU/L   ALT 21 0 - 32 IU/L  TSH  Result Value Ref Range   TSH 1.960 0.450 - 4.500 uIU/mL  Bayer DCA Hb A1c Waived  Result Value Ref Range   Bayer DCA Hb A1c Waived 6.0 <7.0 %  CBC With Differential/Platelet  Result Value Ref Range   WBC 6.0 3.4 - 10.8 x10E3/uL   RBC 4.23 3.77 - 5.28 x10E6/uL   Hemoglobin 12.8 11.1 - 15.9 g/dL   Hematocrit 16.139.9 09.634.0 - 46.6 %   MCV 94 79 - 97 fL   MCH 30.3 26.6 - 33.0 pg   MCHC 32.1 31.5 - 35.7 g/dL   RDW 04.512.9 40.912.3 - 81.115.4 %   Platelets 238 150 - 379 x10E3/uL   Neutrophils 65 Not Estab. %   Lymphs 27 Not Estab. %   MID 8 Not Estab. %   Neutrophils Absolute 3.9 1.4 - 7.0 x10E3/uL   Lymphocytes Absolute 1.6 0.7 - 3.1 x10E3/uL   MID (Absolute) 0.5 0.1 -  1.6 X10E3/uL      Assessment & Plan:   Problem List Items Addressed This Visit      Cardiovascular and Mediastinum   Essential hypertension - Primary    Reduce losartan to 50 mg, continue 12.5 mg HCTZ. Continue good diet and exercise, stress reduction. Suspect requirements may be decreasing with good lifestyle changes. Will check some basic labs to r/o other causes of fatigue and malaise, but suspect the hypotension is the culprit.        Other Visit Diagnoses    Fatigue, unspecified type       Relevant Orders   Comprehensive metabolic panel (Completed)   TSH (Completed)   Bayer DCA Hb A1c Waived (Completed)   CBC With Differential/Platelet (Completed)   Palpitations       EKG benign, NSR at 73 bpm with no suspicious changes. Continue to monitor   Relevant Orders   EKG 12-Lead (Completed)       Follow up plan: Return for as scheduled.

## 2016-12-05 LAB — COMPREHENSIVE METABOLIC PANEL
ALT: 21 IU/L (ref 0–32)
AST: 17 IU/L (ref 0–40)
Albumin/Globulin Ratio: 1.9 (ref 1.2–2.2)
Albumin: 4.2 g/dL (ref 3.6–4.8)
Alkaline Phosphatase: 71 IU/L (ref 39–117)
BUN/Creatinine Ratio: 20 (ref 12–28)
BUN: 18 mg/dL (ref 8–27)
Bilirubin Total: 0.3 mg/dL (ref 0.0–1.2)
CO2: 24 mmol/L (ref 20–29)
Calcium: 9.1 mg/dL (ref 8.7–10.3)
Chloride: 103 mmol/L (ref 96–106)
Creatinine, Ser: 0.89 mg/dL (ref 0.57–1.00)
GFR calc Af Amer: 80 mL/min/{1.73_m2} (ref >59)
GFR calc non Af Amer: 70 mL/min/{1.73_m2} (ref >59)
Globulin, Total: 2.2 g/dL (ref 1.5–4.5)
Glucose: 115 mg/dL — ABNORMAL HIGH (ref 65–99)
Potassium: 3.8 mmol/L (ref 3.5–5.2)
Sodium: 143 mmol/L (ref 134–144)
Total Protein: 6.4 g/dL (ref 6.0–8.5)

## 2016-12-05 LAB — CBC WITH DIFFERENTIAL/PLATELET
HEMOGLOBIN: 12.8 g/dL (ref 11.1–15.9)
Hematocrit: 39.9 % (ref 34.0–46.6)
Lymphocytes Absolute: 1.6 10*3/uL (ref 0.7–3.1)
Lymphs: 27 %
MCH: 30.3 pg (ref 26.6–33.0)
MCHC: 32.1 g/dL (ref 31.5–35.7)
MCV: 94 fL (ref 79–97)
MID (Absolute): 0.5 10*3/uL (ref 0.1–1.6)
MID: 8 %
NEUTROS ABS: 3.9 10*3/uL (ref 1.4–7.0)
NEUTROS PCT: 65 %
Platelets: 238 10*3/uL (ref 150–379)
RBC: 4.23 x10E6/uL (ref 3.77–5.28)
RDW: 12.9 % (ref 12.3–15.4)
WBC: 6 10*3/uL (ref 3.4–10.8)

## 2016-12-05 LAB — TSH: TSH: 1.96 u[IU]/mL (ref 0.450–4.500)

## 2016-12-05 LAB — BAYER DCA HB A1C WAIVED: HB A1C (BAYER DCA - WAIVED): 6 % (ref <7.0)

## 2016-12-05 NOTE — Assessment & Plan Note (Signed)
Reduce losartan to 50 mg, continue 12.5 mg HCTZ. Continue good diet and exercise, stress reduction. Suspect requirements may be decreasing with good lifestyle changes. Will check some basic labs to r/o other causes of fatigue and malaise, but suspect the hypotension is the culprit.

## 2016-12-24 ENCOUNTER — Other Ambulatory Visit: Payer: Self-pay | Admitting: Family Medicine

## 2016-12-25 ENCOUNTER — Other Ambulatory Visit: Payer: Self-pay | Admitting: Family Medicine

## 2016-12-25 ENCOUNTER — Other Ambulatory Visit: Payer: Self-pay | Admitting: Unknown Physician Specialty

## 2017-01-01 ENCOUNTER — Other Ambulatory Visit: Payer: Self-pay | Admitting: Family Medicine

## 2017-01-15 ENCOUNTER — Encounter: Payer: Self-pay | Admitting: Family Medicine

## 2017-01-15 ENCOUNTER — Ambulatory Visit (INDEPENDENT_AMBULATORY_CARE_PROVIDER_SITE_OTHER): Payer: 59 | Admitting: Family Medicine

## 2017-01-15 VITALS — BP 122/86 | HR 78 | Temp 97.9°F | Wt 206.0 lb

## 2017-01-15 DIAGNOSIS — J019 Acute sinusitis, unspecified: Secondary | ICD-10-CM

## 2017-01-15 MED ORDER — HYDROCOD POLST-CPM POLST ER 10-8 MG/5ML PO SUER: 2.5000 mL | Freq: Two times a day (BID) | ORAL | 0 refills | Status: DC | PRN

## 2017-01-15 MED ORDER — BENZONATATE 100 MG PO CAPS: 100.0000 mg | ORAL_CAPSULE | Freq: Two times a day (BID) | ORAL | 0 refills | Status: DC | PRN

## 2017-01-15 MED ORDER — SULFAMETHOXAZOLE-TRIMETHOPRIM 800-160 MG PO TABS: 1.0000 | ORAL_TABLET | Freq: Two times a day (BID) | ORAL | 0 refills | Status: DC

## 2017-01-15 NOTE — Progress Notes (Signed)
BP 122/86   Pulse 78   Temp 97.9 F (36.6 C) (Oral)   Wt 206 lb (93.4 kg)   SpO2 98%   BMI 40.23 kg/m    Subjective:    Patient ID: [name], female    DOB: 08-25-1955, 61 y.o.   MRN: [id number]  HPI: [name] is a 61 y.o. female  Chief Complaint  Patient presents with  . Sore Throat    x 3 days.   . Fever  . Chills  Patient's been feeling bad all over his tried some over-the-counter medications with no real relief such as Tylenol Tylenol sinus Mucinex and Flonase  Relevant past medical, surgical, family and social history reviewed and updated as indicated. Interim medical history since our last visit reviewed. Allergies and medications reviewed and updated.  Review of Systems  Constitutional: Negative.   Respiratory: Positive for cough.   Cardiovascular: Negative.    patient coughing a lot  Per HPI unless specifically indicated above     Objective:    BP 122/86   Pulse 78   Temp 97.9 F (36.6 C) (Oral)   Wt 206 lb (93.4 kg)   SpO2 98%   BMI 40.23 kg/m   Wt Readings from Last 3 Encounters:  01/15/17 206 lb (93.4 kg)  12/04/16 202 lb (91.6 kg)  09/26/16 203 lb (92.1 kg)    Physical Exam  Constitutional: She is oriented to person, place, and time. She appears well-developed and well-nourished.  HENT:  Head: Normocephalic and atraumatic.  Right Ear: External ear normal.  Left Ear: External ear normal.  Mouth/Throat: Oropharyngeal exudate present.  Eyes: Conjunctivae and EOM are normal.  Neck: Normal range of motion.  Cardiovascular: Normal rate, regular rhythm and normal heart sounds.   Pulmonary/Chest: Effort normal and breath sounds normal.  Musculoskeletal: Normal range of motion.  Lymphadenopathy:    She has no cervical adenopathy.  Neurological: She is alert and oriented to person, place, and time.  Skin: No erythema.  Psychiatric: She has a normal mood and affect. Her behavior is normal. Judgment and thought content  normal.    Results for orders placed or performed in visit on 12/04/16  Comprehensive metabolic panel  Result Value Ref Range   Glucose 115 (H) 65 - 99 mg/dL   BUN 18 8 - 27 mg/dL   Creatinine, Ser 4.69 0.57 - 1.00 mg/dL   GFR calc non Af Amer 70 >59 mL/min/1.73   GFR calc Af Amer 80 >59 mL/min/1.73   BUN/Creatinine Ratio 20 12 - 28   Sodium 143 134 - 144 mmol/L   Potassium 3.8 3.5 - 5.2 mmol/L   Chloride 103 96 - 106 mmol/L   CO2 24 20 - 29 mmol/L   Calcium 9.1 8.7 - 10.3 mg/dL   Total Protein 6.4 6.0 - 8.5 g/dL   Albumin 4.2 3.6 - 4.8 g/dL   Globulin, Total 2.2 1.5 - 4.5 g/dL   Albumin/Globulin Ratio 1.9 1.2 - 2.2   Bilirubin Total 0.3 0.0 - 1.2 mg/dL   Alkaline Phosphatase 71 39 - 117 IU/L   AST 17 0 - 40 IU/L   ALT 21 0 - 32 IU/L  TSH  Result Value Ref Range   TSH 1.960 0.450 - 4.500 uIU/mL  Bayer DCA Hb A1c Waived  Result Value Ref Range   Bayer DCA Hb A1c Waived 6.0 <7.0 %  CBC With Differential/Platelet  Result Value Ref Range   WBC 6.0 3.4 - 10.8 x10E3/uL  RBC 4.23 3.77 - 5.28 x10E6/uL   Hemoglobin 12.8 11.1 - 15.9 g/dL   Hematocrit 62.1 30.8 - 46.6 %   MCV 94 79 - 97 fL   MCH 30.3 26.6 - 33.0 pg   MCHC 32.1 31.5 - 35.7 g/dL   RDW 65.7 84.6 - 96.2 %   Platelets 238 150 - 379 x10E3/uL   Neutrophils 65 Not Estab. %   Lymphs 27 Not Estab. %   MID 8 Not Estab. %   Neutrophils Absolute 3.9 1.4 - 7.0 x10E3/uL   Lymphocytes Absolute 1.6 0.7 - 3.1 x10E3/uL   MID (Absolute) 0.5 0.1 - 1.6 X10E3/uL      Assessment & Plan:   Problem List Items Addressed This Visit    None    Visit Diagnoses    Acute sinusitis, recurrence not specified, unspecified location    -  Primary   Relevant Medications   sulfamethoxazole-trimethoprim (BACTRIM DS,SEPTRA DS) 800-160 MG tablet   benzonatate (TESSALON) 100 MG capsule     Discussed sinusitis care and treatment use of antibiotics and cough medicine discuss over-the-counter medications and use of nasal rinse Mucinex Tylenol  sinus etc. Patient also remains very frustrated over her lack of weight loss is been really trying hard reviewed again weight loss techniques and calorie restriction which patient will double down on will continue her exercises she is doing  Follow up plan: Return for As scheduled.

## 2017-01-24 ENCOUNTER — Other Ambulatory Visit: Payer: Self-pay | Admitting: Family Medicine

## 2017-01-24 NOTE — Telephone Encounter (Signed)
Can we please find out if she has enough to last until Monday when Dr. Dossie Arbour returns?

## 2017-01-24 NOTE — Telephone Encounter (Signed)
Called and left a message for patient to return my call to let me know if she has enough medication.

## 2017-01-27 NOTE — Telephone Encounter (Signed)
Routing back to [employer] [facility]

## 2017-01-28 ENCOUNTER — Other Ambulatory Visit: Payer: Self-pay | Admitting: Family Medicine

## 2017-01-28 ENCOUNTER — Encounter: Payer: Self-pay | Admitting: Family Medicine

## 2017-01-28 MED ORDER — BENZONATATE 100 MG PO CAPS: 100.0000 mg | ORAL_CAPSULE | Freq: Two times a day (BID) | ORAL | 2 refills | Status: DC | PRN

## 2017-01-28 NOTE — Telephone Encounter (Signed)
Faxed patients Diazepam to [facility]

## 2017-02-04 ENCOUNTER — Ambulatory Visit
Admission: RE | Admit: 2017-02-04 | Discharge: 2017-02-04 | Disposition: A | Discharge status: Home / Self Care | Payer: 59 | Source: Ambulatory Visit | Attending: Family Medicine | Admitting: Family Medicine

## 2017-02-04 ENCOUNTER — Encounter: Payer: Self-pay | Admitting: Family Medicine

## 2017-02-04 ENCOUNTER — Ambulatory Visit (INDEPENDENT_AMBULATORY_CARE_PROVIDER_SITE_OTHER): Payer: 59 | Admitting: Family Medicine

## 2017-02-04 VITALS — BP 134/80 | HR 59 | Temp 98.2°F | Wt 206.4 lb

## 2017-02-04 DIAGNOSIS — R059 Cough, unspecified: Secondary | ICD-10-CM

## 2017-02-04 DIAGNOSIS — R05 Cough: Secondary | ICD-10-CM | POA: Diagnosis not present

## 2017-02-04 DIAGNOSIS — R350 Frequency of micturition: Secondary | ICD-10-CM | POA: Diagnosis not present

## 2017-02-04 DIAGNOSIS — N3946 Mixed incontinence: Secondary | ICD-10-CM

## 2017-02-04 DIAGNOSIS — I517 Cardiomegaly: Secondary | ICD-10-CM | POA: Diagnosis not present

## 2017-02-04 MED ORDER — CIPROFLOXACIN HCL 500 MG PO TABS: 500.0000 mg | ORAL_TABLET | Freq: Two times a day (BID) | ORAL | 0 refills | Status: DC

## 2017-02-04 NOTE — Addendum Note (Signed)
Addended by: Dorcas Carrow on: 02/04/2017 04:48 PM   Modules accepted: Orders

## 2017-02-04 NOTE — Progress Notes (Addendum)
BP 134/80 (BP Location: Left Arm, Patient Position: Sitting, Cuff Size: Large)   Pulse (!) 59   Temp 98.2 F (36.8 C)   Wt 206 lb 6 oz (93.6 kg)   SpO2 96%   BMI 40.30 kg/m    Subjective:    Patient ID: [name], female    DOB: October 08, 1955, 61 y.o.   MRN: [id number]  HPI: [name] is a 61 y.o. female  Chief Complaint  Patient presents with  . Urinary Incontinence   URINARY SYMPTOMS Duration: Since Thursday Dysuria: no Urinary frequency: yes Urgency: yes Small volume voids: large volumes, soaking a size 6 Symptom severity: severe Urinary incontinence: yes- 4x on Sunday Foul odor: yes Hematuria: yes Abdominal pain: no Back pain: no Suprapubic pain/pressure: yes Flank pain: yes Fever:  yes Vomiting: no Relief with cranberry juice: no Relief with pyridium: no Status: better Previous urinary tract infection: yes Recurrent urinary tract infection: no Sexual activity: No sexually active/monogomous/practicing safe sex History of sexually transmitted disease: no Penile discharge: no Treatments attempted: increasing fluids   Relevant past medical, surgical, family and social history reviewed and updated as indicated. Interim medical history since our last visit reviewed. Allergies and medications reviewed and updated.  Review of Systems  Constitutional: Negative.   Respiratory: Negative.   Cardiovascular: Negative.   Genitourinary: Positive for frequency, hematuria and urgency. Negative for decreased urine volume, difficulty urinating, dyspareunia, dysuria, enuresis, flank pain, genital sores, menstrual problem, pelvic pain, vaginal bleeding, vaginal discharge and vaginal pain.  Musculoskeletal: Negative.   Psychiatric/Behavioral: Negative.     Per HPI unless specifically indicated above     Objective:    BP 134/80 (BP Location: Left Arm, Patient Position: Sitting, Cuff Size: Large)   Pulse (!) 59   Temp 98.2 F (36.8 C)   Wt 206  lb 6 oz (93.6 kg)   SpO2 96%   BMI 40.30 kg/m   Wt Readings from Last 3 Encounters:  02/04/17 206 lb 6 oz (93.6 kg)  01/15/17 206 lb (93.4 kg)  12/04/16 202 lb (91.6 kg)    Physical Exam  Constitutional: She is oriented to person, place, and time. She appears well-developed and well-nourished. No distress.  HENT:  Head: Normocephalic and atraumatic.  Right Ear: Hearing normal.  Left Ear: Hearing normal.  Nose: Nose normal.  Eyes: Conjunctivae and lids are normal. Right eye exhibits no discharge. Left eye exhibits no discharge. No scleral icterus.  Cardiovascular: Normal rate, regular rhythm, normal heart sounds and intact distal pulses.  Exam reveals no gallop and no friction rub.   No murmur heard. Pulmonary/Chest: Effort normal and breath sounds normal. No respiratory distress. She has no wheezes. She has no rales. She exhibits no tenderness.  Musculoskeletal: Normal range of motion.  Neurological: She is alert and oriented to person, place, and time.  Skin: Skin is warm, dry and intact. No rash noted. She is not diaphoretic. No erythema. No pallor.  Psychiatric: She has a normal mood and affect. Her speech is normal and behavior is normal. Judgment and thought content normal. Cognition and memory are normal.  Nursing note and vitals reviewed.   Results for orders placed or performed in visit on 12/04/16  Comprehensive metabolic panel  Result Value Ref Range   Glucose 115 (H) 65 - 99 mg/dL   BUN 18 8 - 27 mg/dL   Creatinine, Ser 8.46 0.57 - 1.00 mg/dL   GFR calc non Af Amer 70 >59 mL/min/1.73   GFR calc  Af Amer 80 >59 mL/min/1.73   BUN/Creatinine Ratio 20 12 - 28   Sodium 143 134 - 144 mmol/L   Potassium 3.8 3.5 - 5.2 mmol/L   Chloride 103 96 - 106 mmol/L   CO2 24 20 - 29 mmol/L   Calcium 9.1 8.7 - 10.3 mg/dL   Total Protein 6.4 6.0 - 8.5 g/dL   Albumin 4.2 3.6 - 4.8 g/dL   Globulin, Total 2.2 1.5 - 4.5 g/dL   Albumin/Globulin Ratio 1.9 1.2 - 2.2   Bilirubin Total 0.3  0.0 - 1.2 mg/dL   Alkaline Phosphatase 71 39 - 117 IU/L   AST 17 0 - 40 IU/L   ALT 21 0 - 32 IU/L  TSH  Result Value Ref Range   TSH 1.960 0.450 - 4.500 uIU/mL  Bayer DCA Hb A1c Waived  Result Value Ref Range   Bayer DCA Hb A1c Waived 6.0 <7.0 %  CBC With Differential/Platelet  Result Value Ref Range   WBC 6.0 3.4 - 10.8 x10E3/uL   RBC 4.23 3.77 - 5.28 x10E6/uL   Hemoglobin 12.8 11.1 - 15.9 g/dL   Hematocrit 72.5 36.6 - 46.6 %   MCV 94 79 - 97 fL   MCH 30.3 26.6 - 33.0 pg   MCHC 32.1 31.5 - 35.7 g/dL   RDW 44.0 34.7 - 42.5 %   Platelets 238 150 - 379 x10E3/uL   Neutrophils 65 Not Estab. %   Lymphs 27 Not Estab. %   MID 8 Not Estab. %   Neutrophils Absolute 3.9 1.4 - 7.0 x10E3/uL   Lymphocytes Absolute 1.6 0.7 - 3.1 x10E3/uL   MID (Absolute) 0.5 0.1 - 1.6 X10E3/uL      Assessment & Plan:   Problem List Items Addressed This Visit    None    Visit Diagnoses    Mixed incontinence    -  Primary   Will treat for UTI given symptoms, despite clean UA- will get into urology for evaluation. Referral generated today.   Relevant Orders   Ambulatory referral to Urology   Urinary frequency       UA clear, but given symptoms, fever, acute onset and increased fluid intake today will treat for UTI- will get into urology. Referral generated   Relevant Orders   UA/M w/rflx Culture, Routine   Cough       Lungs clear. Will obtain CXR given 1 month duration. Sample of trelegy given to see if helps breathing.    Relevant Orders   DG Chest 2 View       Follow up plan: Return in about 4 weeks (around 03/04/2017) for follow up breathing with spiro.

## 2017-02-05 LAB — UA/M W/RFLX CULTURE, ROUTINE
BILIRUBIN UA: NEGATIVE
Glucose, UA: NEGATIVE
KETONES UA: NEGATIVE
Leukocytes, UA: NEGATIVE
Nitrite, UA: NEGATIVE
PROTEIN UA: NEGATIVE
RBC UA: NEGATIVE
SPEC GRAV UA: 1.01 (ref 1.005–1.030)
Urobilinogen, Ur: 0.2 mg/dL (ref 0.2–1.0)
pH, UA: 6 (ref 5.0–7.5)

## 2017-02-11 ENCOUNTER — Ambulatory Visit (INDEPENDENT_AMBULATORY_CARE_PROVIDER_SITE_OTHER): Payer: 59 | Admitting: Urology

## 2017-02-11 ENCOUNTER — Encounter: Payer: Self-pay | Admitting: Urology

## 2017-02-11 VITALS — BP 146/69 | HR 68 | Ht 60.0 in | Wt 203.0 lb

## 2017-02-11 DIAGNOSIS — R32 Unspecified urinary incontinence: Secondary | ICD-10-CM | POA: Diagnosis not present

## 2017-02-11 LAB — URINALYSIS, COMPLETE
Bilirubin, UA: NEGATIVE
Glucose, UA: NEGATIVE
Ketones, UA: NEGATIVE
LEUKOCYTES UA: NEGATIVE
NITRITE UA: NEGATIVE
Protein, UA: NEGATIVE
RBC, UA: NEGATIVE
Specific Gravity, UA: 1.01 (ref 1.005–1.030)
UUROB: 0.2 mg/dL (ref 0.2–1.0)
pH, UA: 5.5 (ref 5.0–7.5)

## 2017-02-11 LAB — BLADDER SCAN AMB NON-IMAGING

## 2017-02-11 NOTE — Progress Notes (Signed)
02/11/2017 2:13 PM   [name] Apr 02, 1956 [id number]  Referring provider: Steele Sizer, MD [address] [place], [place] [address]  Chief Complaint  Patient presents with  . Urinary Incontinence    New Patient    HPI: [name] is a 61 year-old female seen in consultation at the request of Dr. Laural Benes for urinary incontinence.  She has been followed for overactive bladder and has been on Myrbetriq for approximately 2 years after being on Vesicare without improvement.  Approximately 2 weeks ago she had significant worsening of her urinary incontinence with increasing urinary frequency and nocturia up to 10 times per night.  She has large voided volumes.  She also complains of stress urinary incontinence.  She is going through several pads per day.  She describes her symptoms as severe.  She notes an intermittent urinary stream.  She denies dysuria or gross hematuria.  Urinalysis at [facility] was negative.  She denies lower extremity weakness, paresthesias.  She denies bowel symptoms including constipation or fecal urinary cause.  She denies flank, abdominal or pelvic pain.  She thinks her symptoms may have started after picking up a heavy box.     PMH: Past Medical History:  Diagnosis Date  . Asthma   . Depression   . Diabetes mellitus without complication (HCC)   . Hyperlipidemia   . Kidney stones   . Migraine headache     Surgical History: Past Surgical History:  Procedure Laterality Date  . ABDOMINAL HYSTERECTOMY    . BREAST CYST ASPIRATION Right    benign  . BREAST CYST ASPIRATION Right    benign  . CHOLECYSTECTOMY    . COLONOSCOPY  March 2016    Home Medications:  Allergies as of 02/11/2017      Reactions   Amoxicillin Hives      Medication List       Accurate as of 02/11/17  2:13 PM. Always use your most recent med list.          albuterol 108 (90 Base) MCG/ACT inhaler Commonly known as:  PROAIR HFA Inhale 2 puffs  into the lungs every 6 (six) hours as needed for wheezing or shortness of breath.   buPROPion 150 MG 12 hr tablet Commonly known as:  WELLBUTRIN SR TAKE ONE TABLET 3 TIMES DAILY   cyclobenzaprine 5 MG tablet Commonly known as:  FLEXERIL TAKE ONE TABLET 3 TIMES DAILY AS NEEDED FOR MUSCLE SPASM   diazepam 5 MG tablet Commonly known as:  VALIUM TAKE ONE TABLET BY MOUTH EVERY DAY AS NEEDED FOR ANXIETY   diphenoxylate-atropine 2.5-0.025 MG tablet Commonly known as:  LOMOTIL TAKE 1 TABLET BY MOUTH 4 TIMES DAILY AS NEEDED FOR DIARRHEA OR LOOSE STOOLS   fluticasone 50 MCG/ACT nasal spray Commonly known as:  FLONASE Place 2 sprays into both nostrils 2 (two) times daily.   hydrochlorothiazide 25 MG tablet Commonly known as:  HYDRODIURIL TAKE ONE TABLET EVERY DAY   ipratropium-albuterol 0.5-2.5 (3) MG/3ML Soln Commonly known as:  DUONEB Take 3 mLs by nebulization every 6 (six) hours as needed.   montelukast 10 MG tablet Commonly known as:  SINGULAIR TAKE ONE TABLET BY MOUTH AT BEDTIME   MYRBETRIQ 25 MG Tb24 tablet Generic drug:  mirabegron ER TAKE ONE TABLET BY MOUTH EVERY DAY   SYMBICORT 160-4.5 MCG/ACT inhaler Generic drug:  budesonide-formoterol TAKE 2 PUFFS INTO LUNGS TWICE A DAY   tiotropium 18 MCG inhalation capsule Commonly known as:  SPIRIVA Place  into inhaler and inhale daily.       Allergies:  Allergies  Allergen Reactions  . Amoxicillin Hives    Family History: Family History  Problem Relation Age of Onset  . Hypertension Mother   . Cancer Mother        breast  . Arthritis Mother   . Alzheimer's disease Mother   . Parkinson's disease Mother   . Breast cancer Mother 91  . Hypertension Father   . Diabetes Father   . Kidney cancer Father   . Breast cancer Cousin 35  . Breast cancer Maternal Aunt   . Breast cancer Maternal Grandmother 48    Social History:  reports that she has never smoked. She has never used smokeless tobacco. She reports that  she drinks alcohol. She reports that she does not use drugs.  ROS: UROLOGY Frequent Urination?: Yes Hard to postpone urination?: Yes Burning/pain with urination?: No Get up at night to urinate?: Yes Leakage of urine?: Yes Urine stream starts and stops?: Yes Trouble starting stream?: Yes Do you have to strain to urinate?: No Blood in urine?: No Urinary tract infection?: No Sexually transmitted disease?: No Injury to kidneys or bladder?: No Painful intercourse?: No Weak stream?: No Currently pregnant?: No Vaginal bleeding?: No Last menstrual period?: n  Gastrointestinal Nausea?: No Vomiting?: No Indigestion/heartburn?: No Diarrhea?: No Constipation?: No  Constitutional Fever: No Night sweats?: Yes Weight loss?: No Fatigue?: Yes  Skin Skin rash/lesions?: No Itching?: No  Eyes Blurred vision?: No Double vision?: No  Ears/Nose/Throat Sore throat?: No Sinus problems?: No  Hematologic/Lymphatic Swollen glands?: No Easy bruising?: No  Cardiovascular Chest pain?: No  Respiratory Cough?: No Shortness of breath?: No  Endocrine Excessive thirst?: No  Musculoskeletal Back pain?: No Joint pain?: No  Neurological Headaches?: No Dizziness?: No  Psychologic Depression?: Yes Anxiety?: No  Physical Exam: BP (!) 146/69   Pulse 68   Ht 5' (1.524 m)   Wt 203 lb (92.1 kg)   BMI 39.65 kg/m    Constitutional:  Alert and oriented, No acute distress. HEENT: [place] AT, moist mucus membranes.  Trachea midline, no masses. Cardiovascular: No clubbing, cyanosis, or edema. Respiratory: Normal respiratory effort, no increased work of breathing. GI: Abdomen is soft, nontender, nondistended, no abdominal masses GU: No CVA tenderness.  Skin: No rashes, bruises or suspicious lesions. Lymph: No cervical or inguinal adenopathy. Neurologic: Grossly intact, no focal deficits, moving all 4 extremities. Psychiatric: Normal mood and affect.  Laboratory Data: Lab Results    Component Value Date   WBC 6.0 12/04/2016   HGB 12.8 12/04/2016   HCT 39.9 12/04/2016   MCV 94 12/04/2016   PLT 238 12/04/2016    Lab Results  Component Value Date   CREATININE 0.89 12/04/2016    Lab Results  Component Value Date   HGBA1C 6.1 (H) 09/26/2016    Urinalysis Lab Results  Component Value Date   SPECGRAV 1.010 02/04/2017   PHUR 6.0 02/04/2017   COLORU Yellow 02/04/2017   APPEARANCEUR Clear 02/04/2017   LEUKOCYTESUR Negative 02/04/2017   PROTEINUR Negative 02/04/2017   GLUCOSEU Negative 02/04/2017   KETONESU Negative 02/04/2017   RBCU Negative 02/04/2017   BILIRUBINUR Negative 02/04/2017   UUROB 0.2 02/04/2017   NITRITE Negative 02/04/2017    Pertinent Imaging:  Results for orders placed during the hospital encounter of 02/23/15  CT RENAL STONE STUDY   Narrative CLINICAL DATA:  61 year old with acute onset of right low back pain, right flank pain, right lower quadrant abdominal pain,  hematuria and urinary urgency at 1530 hr earlier today. Surgical history includes hysterectomy and cholecystectomy. Current history of hypertension, diabetes, celiac sprue.  EXAM: CT ABDOMEN AND PELVIS WITHOUT CONTRAST  TECHNIQUE: Multidetector CT imaging of the abdomen and pelvis was performed following the standard protocol without IV contrast.  COMPARISON:  None.  FINDINGS: Lower chest: Heart size upper normal. Mosaic attenuation in the visualized lung bases with scattered areas of air trapping. No confluent airspace consolidation.  Hepatobiliary: Liver normal in size and appearance for the unenhanced technique. Gallbladder surgically absent. No biliary ductal dilation.  Pancreas: Normal unenhanced appearance.  Spleen: Normal unenhanced appearance. Splenic artery calcifications at the hilum.  Adrenals/Urinary Tract: Normal appearing adrenal glands. No evidence of urinary tract calculi or obstruction on either side. Benign approximate 1 cm simple cyst  arising from the mid left kidney. Allowing for the unenhanced technique, no significant focal parenchymal abnormality involving either kidney. Normal-appearing decompressed urinary bladder.  Stomach/Bowel: Stomach normal in appearance for the degree of distention. Inspissated stool like material over a long segment of the distal and terminal ileum. Similar inspissated stool like material within a loop of jejunum in the left upper quadrant. Small bowel otherwise unremarkable. Colon normal in appearance with moderate stool burden. Normal-appearing appendix without evidence of inflammation.  Vascular/Lymphatic: No pathologic lymphadenopathy. Mild iliac atherosclerosis.  Reproductive: Surgically absent uterus.  No adnexal masses.  Other: Phleboliths low in both sides of the pelvis.  Musculoskeletal: Degenerative disc disease and spondylosis involving the visualized lower thoracic spine. Multilevel degenerative disc disease and facet degenerative changes throughout the lumbar spine.  IMPRESSION: 1. No evidence of urinary tract calculi or obstruction on either side. 2. No acute abnormalities involving the abdomen or pelvis. 3. Inspissated stool like material involving a long segment of the distal and terminal ileum and a short segment of jejunum, consistent with localized areas of stasis. 4. Scattered areas of air trapping in the visualized lung bases S to be seen in patients with asthma and/or bronchitis.   Electronically Signed   By: Hulan Saashomas  Lawrence M.D.   On: 02/23/2015 21:34     Assessment & Plan:    1. Urinary incontinence, mixed  Acute worsening of mixed incontinence.  PVR by bladder scan was 175 mL.  Urinalysis today was unremarkable.Recommend scheduling cystoscopy and will perform pelvic exam at the time of cystoscopy.  She may also need a urodynamic study.  Increase Myrbetriq to 50 mg while awaiting the studies.  - Urinalysis, Complete - BLADDER SCAN AMB  NON-IMAGING     Riki AltesScott C Ottis Vacha, MD  [facility] [address], [address] [place], [place] [address] [phone]

## 2017-02-12 ENCOUNTER — Ambulatory Visit: Payer: 59 | Admitting: Urology

## 2017-02-13 ENCOUNTER — Ambulatory Visit: Payer: 59 | Admitting: Urology

## 2017-02-13 ENCOUNTER — Encounter: Payer: Self-pay | Admitting: Urology

## 2017-02-13 VITALS — BP 127/76 | HR 73 | Ht 60.0 in | Wt 203.0 lb

## 2017-02-13 DIAGNOSIS — R32 Unspecified urinary incontinence: Secondary | ICD-10-CM

## 2017-02-13 LAB — URINALYSIS, COMPLETE
Bilirubin, UA: NEGATIVE
Glucose, UA: NEGATIVE
Ketones, UA: NEGATIVE
LEUKOCYTES UA: NEGATIVE
NITRITE UA: NEGATIVE
PH UA: 7 (ref 5.0–7.5)
Protein, UA: NEGATIVE
RBC UA: NEGATIVE
Specific Gravity, UA: 1.015 (ref 1.005–1.030)
Urobilinogen, Ur: 0.2 mg/dL (ref 0.2–1.0)

## 2017-02-13 MED ORDER — CIPROFLOXACIN HCL 500 MG PO TABS
500.0000 mg | ORAL_TABLET | Freq: Once | ORAL | Status: AC
  Administered 2017-02-13: 500 mg via ORAL

## 2017-02-13 MED ORDER — LIDOCAINE HCL 2 % EX GEL
1.0000 "application " | Freq: Once | CUTANEOUS | Status: AC
  Administered 2017-02-13: 1 via URETHRAL

## 2017-02-21 ENCOUNTER — Other Ambulatory Visit: Payer: Self-pay | Admitting: Family Medicine

## 2017-02-21 NOTE — Telephone Encounter (Signed)
Routing to provider. Appt on 03/07/17.

## 2017-02-24 ENCOUNTER — Ambulatory Visit: Payer: 59 | Admitting: Urology

## 2017-02-24 NOTE — Progress Notes (Signed)
   02/24/17  CC:  Chief Complaint  Patient presents with  . Cysto    HPI: Refer to office note dated 02/11/2017  Blood pressure 127/76, pulse 73, height 5' (1.524 m), weight 203 lb (92.1 kg). NED. A&Ox3.   No respiratory distress   Abd soft, NT, ND Normal external genitalia with patent urethral meatus.  Marked leakage with cough in supine position  Cystoscopy Procedure Note  Patient identification was confirmed, informed consent was obtained, and patient was prepped using Betadine solution.  Lidocaine jelly was administered per urethral meatus.    Preoperative abx where received prior to procedure.    Procedure: - Flexible cystoscope introduced, without any difficulty.   - Thorough search of the bladder revealed:    normal urethral meatus    normal urothelium    no stones    no ulcers     no tumors    no urethral polyps    no trabeculation  - Ureteral orifices were normal in position and appearance.  Post-Procedure: - Patient tolerated the procedure well  Assessment/ Plan: Significant mixed urinary incontinence.  She is scheduled for urodynamics next week.   Riki AltesScott C Stoioff, MD

## 2017-02-25 ENCOUNTER — Encounter: Payer: Self-pay | Admitting: Urology

## 2017-02-25 ENCOUNTER — Ambulatory Visit: Payer: 59 | Admitting: Urology

## 2017-02-25 ENCOUNTER — Other Ambulatory Visit: Payer: Self-pay | Admitting: Urology

## 2017-02-25 VITALS — BP 130/81 | HR 62 | Ht 60.0 in | Wt 203.3 lb

## 2017-02-25 DIAGNOSIS — N3946 Mixed incontinence: Secondary | ICD-10-CM

## 2017-02-25 NOTE — Progress Notes (Signed)
02/25/2017 4:59 PM   [name] November 09, 1955 [id number]  Referring provider: Steele Sizerrissman, Mark A, MD [address] [place], [place] [address]  Chief Complaint  Patient presents with  . Results    urodynamics    HPI: 61 year old female with acute onset of significant stress/urge incontinence.  Cystoscopy unremarkable with the stress incontinence demonstrated in the supine position.  She is on Myrbetriq.  Urodynamics performed on 02/21/2017.  Bladder capacity was 670 mL.  Normal desire was at 475 mL and first sensation at 377 mL.  No instability was noted.  She had difficulty voiding and generated a bladder pressure of 9 cmH20 that was not sustained.   PMH: Past Medical History:  Diagnosis Date  . Asthma   . Depression   . Diabetes mellitus without complication (HCC)   . Hyperlipidemia   . Kidney stones   . Migraine headache     Surgical History: Past Surgical History:  Procedure Laterality Date  . ABDOMINAL HYSTERECTOMY    . BREAST CYST ASPIRATION Right    benign  . BREAST CYST ASPIRATION Right    benign  . CHOLECYSTECTOMY    . COLONOSCOPY  March 2016    Home Medications:  Allergies as of 02/25/2017      Reactions   Amoxicillin Hives      Medication List        Accurate as of 02/25/17  4:59 PM. Always use your most recent med list.          albuterol 108 (90 Base) MCG/ACT inhaler Commonly known as:  PROAIR HFA Inhale 2 puffs into the lungs every 6 (six) hours as needed for wheezing or shortness of breath.   buPROPion 150 MG 12 hr tablet Commonly known as:  WELLBUTRIN SR TAKE ONE TABLET 3 TIMES DAILY   diazepam 5 MG tablet Commonly known as:  VALIUM TAKE ONE TABLET BY MOUTH EVERY DAY AS NEEDED FOR ANXIETY   diphenoxylate-atropine 2.5-0.025 MG tablet Commonly known as:  LOMOTIL TAKE 1 TABLET BY MOUTH 4 TIMES DAILY AS NEEDED FOR DIARRHEA OR LOOSE STOOLS   fluticasone 50 MCG/ACT nasal spray Commonly known as:  FLONASE Place 2 sprays into both  nostrils 2 (two) times daily.   hydrochlorothiazide 25 MG tablet Commonly known as:  HYDRODIURIL TAKE ONE TABLET EVERY DAY   ipratropium-albuterol 0.5-2.5 (3) MG/3ML Soln Commonly known as:  DUONEB Take 3 mLs by nebulization every 6 (six) hours as needed.   mirabegron ER 25 MG Tb24 tablet Commonly known as:  MYRBETRIQ Take 1 tablet (25 mg total) daily by mouth.   montelukast 10 MG tablet Commonly known as:  SINGULAIR TAKE ONE TABLET BY MOUTH AT BEDTIME   SYMBICORT 160-4.5 MCG/ACT inhaler Generic drug:  budesonide-formoterol TAKE 2 PUFFS INTO LUNGS TWICE A DAY   tiotropium 18 MCG inhalation capsule Commonly known as:  SPIRIVA Place into inhaler and inhale daily.       Allergies:  Allergies  Allergen Reactions  . Amoxicillin Hives    Family History: Family History  Problem Relation Age of Onset  . Hypertension Mother   . Cancer Mother        breast  . Arthritis Mother   . Alzheimer's disease Mother   . Parkinson's disease Mother   . Breast cancer Mother [id number]  . Hypertension Father   . Diabetes Father   . Kidney cancer Father   . Breast cancer Cousin 35  . Breast cancer Maternal Aunt   . Breast cancer Maternal Grandmother  38    Social History:  reports that  has never smoked. she has never used smokeless tobacco. She reports that she drinks alcohol. She reports that she does not use drugs.  ROS: UROLOGY Frequent Urination?: Yes Hard to postpone urination?: No Burning/pain with urination?: No Get up at night to urinate?: Yes Leakage of urine?: Yes Urine stream starts and stops?: No Trouble starting stream?: No Do you have to strain to urinate?: No Blood in urine?: No Urinary tract infection?: No Sexually transmitted disease?: No Injury to kidneys or bladder?: No Painful intercourse?: No Weak stream?: No Currently pregnant?: No Vaginal bleeding?: No Last menstrual period?: n  Gastrointestinal Nausea?: No Vomiting?: No Indigestion/heartburn?:  No Diarrhea?: No Constipation?: No  Constitutional Fever: No Night sweats?: Yes Weight loss?: No Fatigue?: No  Skin Skin rash/lesions?: No Itching?: No  Eyes Blurred vision?: No Double vision?: No  Ears/Nose/Throat Sore throat?: No Sinus problems?: No  Hematologic/Lymphatic Swollen glands?: No Easy bruising?: No  Cardiovascular Leg swelling?: No Chest pain?: No  Respiratory Cough?: No Shortness of breath?: No  Endocrine Excessive thirst?: No  Musculoskeletal Back pain?: No Joint pain?: No  Neurological Headaches?: No Dizziness?: No  Psychologic Depression?: No Anxiety?: No  Physical Exam: BP 130/81 (BP Location: Right Arm, Patient Position: Sitting, Cuff Size: Large)   Pulse 62   Ht 5' (1.524 m)   Wt 203 lb 4.8 oz (92.2 kg)   BMI 39.70 kg/m   Constitutional:  Alert and oriented, No acute distress. HEENT: [place] AT, moist mucus membranes.  Trachea midline, no masses. Cardiovascular: No clubbing, cyanosis, or edema. Respiratory: Normal respiratory effort, no increased work of breathing. GI: Abdomen is soft, nontender, nondistended, no abdominal masses GU: No CVA tenderness.  Skin: No rashes, bruises or suspicious lesions. Lymph: No cervical or inguinal adenopathy. Neurologic: Grossly intact, no focal deficits, moving all 4 extremities. Psychiatric: Normal mood and affect.  Laboratory Data: Lab Results  Component Value Date   WBC 6.0 12/04/2016   HGB 12.8 12/04/2016   HCT 39.9 12/04/2016   MCV 94 12/04/2016   PLT 238 12/04/2016    Lab Results  Component Value Date   CREATININE 0.89 12/04/2016    Lab Results  Component Value Date   HGBA1C 6.1 (H) 09/26/2016    Urinalysis Lab Results  Component Value Date   SPECGRAV 1.015 02/13/2017   PHUR 7.0 02/13/2017   COLORU Yellow 02/13/2017   APPEARANCEUR Clear 02/13/2017   LEUKOCYTESUR Negative 02/13/2017   PROTEINUR Negative 02/13/2017   GLUCOSEU Negative 02/13/2017   KETONESU Negative  02/13/2017   RBCU Negative 02/13/2017   BILIRUBINUR Negative 02/13/2017   UUROB 0.2 02/13/2017   NITRITE Negative 02/13/2017    Lab Results  Component Value Date   LABMICR See below: 05/15/2015   WBCUA 0-5 05/15/2015   RBCUA 3-10 (A) 05/15/2015   LABEPIT 0-10 05/15/2015   BACTERIA Few 05/15/2015     Results for orders placed during the hospital encounter of 02/23/15  CT RENAL STONE STUDY   Narrative CLINICAL DATA:  61 year old with acute onset of right low back pain, right flank pain, right lower quadrant abdominal pain, hematuria and urinary urgency at 1530 hr earlier today. Surgical history includes hysterectomy and cholecystectomy. Current history of hypertension, diabetes, celiac sprue.  EXAM: CT ABDOMEN AND PELVIS WITHOUT CONTRAST  TECHNIQUE: Multidetector CT imaging of the abdomen and pelvis was performed following the standard protocol without IV contrast.  COMPARISON:  None.  FINDINGS: Lower chest: Heart size upper normal. Mosaic attenuation  in the visualized lung bases with scattered areas of air trapping. No confluent airspace consolidation.  Hepatobiliary: Liver normal in size and appearance for the unenhanced technique. Gallbladder surgically absent. No biliary ductal dilation.  Pancreas: Normal unenhanced appearance.  Spleen: Normal unenhanced appearance. Splenic artery calcifications at the hilum.  Adrenals/Urinary Tract: Normal appearing adrenal glands. No evidence of urinary tract calculi or obstruction on either side. Benign approximate 1 cm simple cyst arising from the mid left kidney. Allowing for the unenhanced technique, no significant focal parenchymal abnormality involving either kidney. Normal-appearing decompressed urinary bladder.  Stomach/Bowel: Stomach normal in appearance for the degree of distention. Inspissated stool like material over a long segment of the distal and terminal ileum. Similar inspissated stool like material  within a loop of jejunum in the left upper quadrant. Small bowel otherwise unremarkable. Colon normal in appearance with moderate stool burden. Normal-appearing appendix without evidence of inflammation.  Vascular/Lymphatic: No pathologic lymphadenopathy. Mild iliac atherosclerosis.  Reproductive: Surgically absent uterus.  No adnexal masses.  Other: Phleboliths low in both sides of the pelvis.  Musculoskeletal: Degenerative disc disease and spondylosis involving the visualized lower thoracic spine. Multilevel degenerative disc disease and facet degenerative changes throughout the lumbar spine.  IMPRESSION: 1. No evidence of urinary tract calculi or obstruction on either side. 2. No acute abnormalities involving the abdomen or pelvis. 3. Inspissated stool like material involving a long segment of the distal and terminal ileum and a short segment of jejunum, consistent with localized areas of stasis. 4. Scattered areas of air trapping in the visualized lung bases S to be seen in patients with asthma and/or bronchitis.   Electronically Signed   By: Hulan Saashomas  Lawrence M.D.   On: 02/23/2015 21:34     Assessment & Plan:    1. Mixed stress and urge urinary incontinence Her stress incontinence is bothersome and she is very interested in sling surgery.  I am concerned she is at risk for urinary retention based on her bladder sensation and detrusor contraction.  I have recommended she see Dr. Sherron MondayMacDiarmid for further evaluation.    Riki AltesScott C Zarria Towell, MD   [facility] [address], [address] [place], [place] [address] [phone]

## 2017-03-03 ENCOUNTER — Encounter: Payer: Self-pay | Admitting: Urology

## 2017-03-03 ENCOUNTER — Ambulatory Visit (INDEPENDENT_AMBULATORY_CARE_PROVIDER_SITE_OTHER): Payer: 59 | Admitting: Urology

## 2017-03-03 ENCOUNTER — Encounter: Payer: Self-pay | Admitting: Family Medicine

## 2017-03-03 ENCOUNTER — Ambulatory Visit: Payer: 59 | Admitting: Family Medicine

## 2017-03-03 VITALS — BP 127/76 | HR 62 | Temp 98.0°F | Wt 199.4 lb

## 2017-03-03 VITALS — BP 118/71 | HR 79 | Ht 60.0 in | Wt 201.1 lb

## 2017-03-03 DIAGNOSIS — J42 Unspecified chronic bronchitis: Secondary | ICD-10-CM | POA: Diagnosis not present

## 2017-03-03 DIAGNOSIS — N3946 Mixed incontinence: Secondary | ICD-10-CM

## 2017-03-03 DIAGNOSIS — R05 Cough: Secondary | ICD-10-CM | POA: Diagnosis not present

## 2017-03-03 DIAGNOSIS — R059 Cough, unspecified: Secondary | ICD-10-CM

## 2017-03-03 DIAGNOSIS — J4540 Moderate persistent asthma, uncomplicated: Secondary | ICD-10-CM | POA: Diagnosis not present

## 2017-03-03 MED ORDER — FESOTERODINE FUMARATE ER 4 MG PO TB24: 4.0000 mg | ORAL_TABLET | Freq: Every day | ORAL | 11 refills | Status: DC

## 2017-03-03 MED ORDER — MIRABEGRON ER 50 MG PO TB24: 50.0000 mg | ORAL_TABLET | Freq: Every day | ORAL | 3 refills | Status: DC

## 2017-03-03 MED ORDER — FLUTICASONE-UMECLIDIN-VILANT 100-62.5-25 MCG/INH IN AEPB
1.0000 | INHALATION_SPRAY | Freq: Every day | RESPIRATORY_TRACT | 4 refills | Status: DC
Start: 2017-03-03 — End: 2017-07-31

## 2017-03-03 NOTE — Assessment & Plan Note (Signed)
Under good control on current regimen. Call with any concern. Continue current regimen.  

## 2017-03-03 NOTE — Progress Notes (Signed)
03/03/2017 4:04 PM   [name] May 20, 1955 [id number]  Referring provider: Steele [name], Julia A, MD [address] [place], [place] [id number]  Chief Complaint  Patient presents with  . Urinary Incontinence    HPI: Dr. Lonna CobbStoioff: The patient has failed Myrbetriq and Vesicare.  She had an acute onset of worsening frequency and nocturia x10.  The patient had [name] normal CT scan.  On October 25 the patient had [name] normal cystoscopy.  The patient had urodynamics.  There was concerned about the higher risk of retention. Cystoscopy unremarkable with the stress incontinence demonstrated in the supine position.  She is on Myrbetriq.  Urodynamics performed on 02/21/2017.  Bladder capacity was 670 mL.  Normal desire was at 475 mL and first sensation at 377 mL.  No instability was noted.  She had difficulty voiding and generated [name] bladder pressure of 9 cmH20 that was not sustained.  The patient leaks with coughing and sneezing and bending and lifting as well as walking.  She denies urge incontinence.  She has nighttime bedwetting moderate in severity.  She can void every 45 minutes and cannot sit through [name] 2-hour movie.  She gets up 8 or 10 times at night.  She voids due to pressure that is relieved when she voids.  She has some urgency but no pelvic pain.  She is not sexually active.  On further questioning she leaks [name] reasonable amount with stress maneuvers.  As she told [name] during urodynamics if she ignores her bladder she can leak [name] lot on the way to the restroom.  She has had [name] hysterectomy in her bowel movements are normal.  She is [name] diet-controlled diabetic losing weight.  During urodynamics she did have pressure.  This is how she described her urge to void.  [name] noted that the bladder was stable but had pointed out perhaps one low pressure bladder contraction in the filling phase.  The patient noted she does not go to the restroom when she gets the pressure she can leak [name] large volume on her  way to the bathroom.  At 300 mL her cough leak point pressure was 91 cm of water and it was mild.  At 480 mL her Valsalva leak point pressure was 41 cmH2O and it was mild.  She did have trouble to initiate urination noted.  She generated [name] weak bladder contraction of 9 cm of water.  It looks like she triggered [name] contraction while she was trying to urinate.  There was [name] mild increase in EMG activity during the voiding phase.  Bladder neck descended 2 cm.   PMH: Past Medical History:  Diagnosis Date  . Asthma   . Depression   . Diabetes mellitus without complication (HCC)   . Hyperlipidemia   . Kidney stones   . Migraine headache     Surgical History: Past Surgical History:  Procedure Laterality Date  . ABDOMINAL HYSTERECTOMY    . BREAST CYST ASPIRATION Right    benign  . BREAST CYST ASPIRATION Right    benign  . CHOLECYSTECTOMY    . COLONOSCOPY  March 2016    Home Medications:  Allergies as of 03/03/2017      Reactions   Amoxicillin Hives      Medication List        Accurate as of 03/03/17  4:04 PM. Always use your most recent med list.          albuterol 108 (90 Base) MCG/ACT inhaler Commonly known  as:  PROAIR HFA Inhale 2 puffs into the lungs every 6 (six) hours as needed for wheezing or shortness of breath.   buPROPion 150 MG 12 hr tablet Commonly known as:  WELLBUTRIN SR TAKE ONE TABLET 3 TIMES DAILY   cyclobenzaprine 5 MG tablet Commonly known as:  FLEXERIL   diazepam 5 MG tablet Commonly known as:  VALIUM TAKE ONE TABLET BY MOUTH EVERY DAY AS NEEDED FOR ANXIETY   diphenoxylate-atropine 2.5-0.025 MG tablet Commonly known as:  LOMOTIL TAKE 1 TABLET BY MOUTH 4 TIMES DAILY AS NEEDED FOR DIARRHEA OR LOOSE STOOLS   fluticasone 50 MCG/ACT nasal spray Commonly known as:  FLONASE Place 2 sprays into both nostrils 2 (two) times daily.   Fluticasone-Umeclidin-Vilant 100-62.5-25 MCG/INH Aepb Commonly known as:  TRELEGY ELLIPTA Inhale 1 puff daily into the  lungs.   hydrochlorothiazide 25 MG tablet Commonly known as:  HYDRODIURIL TAKE ONE TABLET EVERY DAY   ipratropium-albuterol 0.5-2.5 (3) MG/3ML Soln Commonly known as:  DUONEB Take 3 mLs by nebulization every 6 (six) hours as needed.   mirabegron ER 50 MG Tb24 tablet Commonly known as:  MYRBETRIQ Take 1 tablet (50 mg total) daily by mouth.   montelukast 10 MG tablet Commonly known as:  SINGULAIR TAKE ONE TABLET BY MOUTH AT BEDTIME   SPIRIVA HANDIHALER 18 MCG inhalation capsule Generic drug:  tiotropium   SYMBICORT 160-4.5 MCG/ACT inhaler Generic drug:  budesonide-formoterol       Allergies:  Allergies  Allergen Reactions  . Amoxicillin Hives    Family History: Family History  Problem Relation Age of Onset  . Hypertension Mother   . Cancer Mother        breast  . Arthritis Mother   . Alzheimer's disease Mother   . Parkinson's disease Mother   . Breast cancer Mother [id number]  . Hypertension Father   . Diabetes Father   . Kidney cancer Father   . Breast cancer Cousin 35  . Breast cancer Maternal Aunt   . Breast cancer Maternal Grandmother [id number]    Social History:  reports that  has never smoked. she has never used smokeless tobacco. She reports that she drinks alcohol. She reports that she does not use drugs.  ROS: UROLOGY Frequent Urination?: Yes Hard to postpone urination?: No Burning/pain with urination?: No Get up at night to urinate?: Yes Leakage of urine?: Yes Urine stream starts and stops?: No Trouble starting stream?: No Do you have to strain to urinate?: No Blood in urine?: No Urinary tract infection?: No Sexually transmitted disease?: No Injury to kidneys or bladder?: No Painful intercourse?: No Weak stream?: No Currently pregnant?: No Vaginal bleeding?: No Last menstrual period?: n  Gastrointestinal Nausea?: No Vomiting?: No Indigestion/heartburn?: No Diarrhea?: No Constipation?: No  Constitutional Fever: No Night sweats?: No Weight  loss?: No Fatigue?: No  Skin Skin rash/lesions?: No Itching?: No  Eyes Blurred vision?: No Double vision?: No  Ears/Nose/Throat Sore throat?: No Sinus problems?: No  Hematologic/Lymphatic Swollen glands?: No Easy bruising?: No  Cardiovascular Leg swelling?: No Chest pain?: No  Respiratory Cough?: No Shortness of breath?: No  Endocrine Excessive thirst?: No  Musculoskeletal Back pain?: No Joint pain?: No  Neurological Headaches?: No Dizziness?: No  Psychologic Depression?: No Anxiety?: No  Physical Exam: BP 118/71 (BP Location: Right Arm, Patient Position: Sitting, Cuff Size: Large)   Pulse 79   Ht 5' (1.524 m)   Wt 201 lb 1.6 oz (91.2 kg)   BMI 39.27 kg/m   Constitutional:  Alert  and oriented, No acute distress. HEENT: [place] AT, moist mucus membranes.  Trachea midline, no masses. Cardiovascular: No clubbing, cyanosis, or edema. Respiratory: Normal respiratory effort, no increased work of breathing. GI: Abdomen is soft, nontender, nondistended, no abdominal masses GU: Grade 1 hypermobility of the bladder neck with [name] mild positive cough test.  No prolapse.  She had [name] moderate suburethral swelling that was nontender Skin: No rashes, bruises or suspicious lesions. Lymph: No cervical or inguinal adenopathy. Neurologic: Grossly intact, no focal deficits, moving all 4 extremities. Psychiatric: Normal mood and affect.  Laboratory Data: Lab Results  Component Value Date   WBC 6.0 12/04/2016   HGB 12.8 12/04/2016   HCT 39.9 12/04/2016   MCV 94 12/04/2016   PLT 238 12/04/2016    Lab Results  Component Value Date   CREATININE 0.89 12/04/2016    No results found for: PSA  No results found for: TESTOSTERONE  Lab Results  Component Value Date   HGBA1C 6.1 (H) 09/26/2016    Urinalysis    Component Value Date/Time   COLORURINE YELLOW ([name]) 02/23/2015 1949   APPEARANCEUR Clear 02/13/2017 1004   LABSPEC 1.019 02/23/2015 1949   PHURINE 5.0 02/23/2015  1949   GLUCOSEU Negative 02/13/2017 1004   HGBUR 3+ ([name]) 02/23/2015 1949   BILIRUBINUR Negative 02/13/2017 1004   KETONESUR NEGATIVE 02/23/2015 1949   PROTEINUR Negative 02/13/2017 1004   PROTEINUR 30 ([name]) 02/23/2015 1949   NITRITE Negative 02/13/2017 1004   NITRITE NEGATIVE 02/23/2015 1949   LEUKOCYTESUR Negative 02/13/2017 1004    Pertinent Imaging: None  Assessment & Plan: The patient has mild to moderate stress incontinence but has very impressive frequency and severe nocturia.  She has moderately severe bedwetting.  She also has pressure but no other pelvic pain.  I think she does leak on the way to the restroom and clinically has an overactive bladder that is significant.  She denies ankle edema but takes hydrochlorothiazide.  I am concerned about recommending [name] sling with her very frequent bladder both day and night.  I do not think there is enough evidence to do [name] hydrodistention's at this stage but her pressure could worsen after surgery.  She also may be at increased risk of retention as noted and this would need to be discussed.  [name] picture was drawn.  If she did consent to surgery she would need an MRI prior to rule out an occult urethral diverticulum.  I would need to mention the role of refractory therapies and living with her stress incontinence if she was consented.  Maralyn Sago told me that when the patient stood up she emptied her bladder once again pointing towards [name] very overactive bladder.  I do believe that if she could live with her stress incontinence of refractory neuromodulation treatment would be best for her.   The patient was given Toviaz 4 mg samples and prescription.  She understands we will set up goals and discuss refractory therapies for sling next time.  She understands that I believe 70% of her problem is an overactive bladder and she has mild to moderate stress incontinence.  She understands she would need [name] future MRI if she was going to have [name] sling  There are no  diagnoses linked to this encounter.  No Follow-up on file.  Martina Sinner, MD  [facility] [address], [address] [place], [place] [address] [phone]

## 2017-03-03 NOTE — Assessment & Plan Note (Signed)
Continue to follow with urology. If she wants a 2nd opinion, will call and we will put in the referral.

## 2017-03-03 NOTE — Progress Notes (Signed)
BP 127/76 (BP Location: Left Arm, Patient Position: Sitting, Cuff Size: Large)   Pulse 62   Temp 98 F (36.7 C)   Wt 199 lb 6 oz (90.4 kg)   SpO2 97%   BMI 38.94 kg/m    Subjective:    Patient ID: [name], female    DOB: 1956-04-16, 61 y.o.   MRN: [id number]  HPI: Julia FireCynthia Renee Mahlum is a 61 y.o. female  Chief Complaint  Patient presents with  . Asthma   Doing much better. Really likes the trelegy. She feels like her breathing is significantly better. She notes that her cough has resolved. She is not wheezing. She is feeling better. She went to see urology and is going to see the surgeon to discuss her incontinence. She is anxious about urinary retention and that she may have to self cath. She may want a 2nd opinion before proceeding to surgery. No other concerns or complaints at this time.   Relevant past medical, surgical, family and social history reviewed and updated as indicated. Interim medical history since our last visit reviewed. Allergies and medications reviewed and updated.  Review of Systems  Constitutional: Negative.   Respiratory: Negative.   Cardiovascular: Negative.   Genitourinary: Positive for frequency and urgency. Negative for decreased urine volume, difficulty urinating, dyspareunia, dysuria, enuresis, flank pain, genital sores, hematuria, menstrual problem, pelvic pain, vaginal bleeding, vaginal discharge and vaginal pain.       Incontinence   Psychiatric/Behavioral: Negative.     Per HPI unless specifically indicated above     Objective:    BP 127/76 (BP Location: Left Arm, Patient Position: Sitting, Cuff Size: Large)   Pulse 62   Temp 98 F (36.7 C)   Wt 199 lb 6 oz (90.4 kg)   SpO2 97%   BMI 38.94 kg/m   Wt Readings from Last 3 Encounters:  03/03/17 199 lb 6 oz (90.4 kg)  02/25/17 203 lb 4.8 oz (92.2 kg)  02/13/17 203 lb (92.1 kg)    Physical Exam  Constitutional: She is oriented to person, place, and time. She  appears well-developed and well-nourished. No distress.  HENT:  Head: Normocephalic and atraumatic.  Right Ear: Hearing normal.  Left Ear: Hearing normal.  Nose: Nose normal.  Eyes: Conjunctivae and lids are normal. Right eye exhibits no discharge. Left eye exhibits no discharge. No scleral icterus.  Cardiovascular: Normal rate, regular rhythm, normal heart sounds and intact distal pulses. Exam reveals no gallop and no friction rub.  No murmur heard. Pulmonary/Chest: Effort normal and breath sounds normal. No respiratory distress. She has no wheezes. She has no rales. She exhibits no tenderness.  Musculoskeletal: Normal range of motion.  Neurological: She is alert and oriented to person, place, and time.  Skin: Skin is warm, dry and intact. No rash noted. She is not diaphoretic. No erythema. No pallor.  Psychiatric: She has a normal mood and affect. Her speech is normal and behavior is normal. Judgment and thought content normal. Cognition and memory are normal.  Nursing note and vitals reviewed.   Results for orders placed or performed in visit on 02/13/17  Urinalysis, Complete  Result Value Ref Range   Specific Gravity, UA 1.015 1.005 - 1.030   pH, UA 7.0 5.0 - 7.5   Color, UA Yellow Yellow   Appearance Ur Clear Clear   Leukocytes, UA Negative Negative   Protein, UA Negative Negative/Trace   Glucose, UA Negative Negative   Ketones, UA Negative Negative  RBC, UA Negative Negative   Bilirubin, UA Negative Negative   Urobilinogen, Ur 0.2 0.2 - 1.0 mg/dL   Nitrite, UA Negative Negative      Assessment & Plan:   Problem List Items Addressed This Visit      Respiratory   Asthma - Primary    Under good control on current regimen. Call with any concern. Continue current regimen.       Relevant Medications   Fluticasone-Umeclidin-Vilant (TRELEGY ELLIPTA) 100-62.5-25 MCG/INH AEPB   Other Relevant Orders   Spirometry with Graph (Completed)   Chronic bronchitis (HCC)    Under  good control on current regimen. Call with any concern. Continue current regimen.       Relevant Orders   Spirometry with Graph (Completed)     Other   Mixed stress and urge urinary incontinence    Continue to follow with urology. If she wants a 2nd opinion, will call and we will put in the referral.       Relevant Medications   mirabegron ER (MYRBETRIQ) 50 MG TB24 tablet    Other Visit Diagnoses    Cough       Under good control on current regimen. Call with any concerns.    Relevant Orders   Spirometry with Graph (Completed)       Follow up plan: No Follow-up on file.

## 2017-03-03 NOTE — Assessment & Plan Note (Signed)
Under good control on current regimen. Call with any concern. Continue current regimen.

## 2017-03-07 ENCOUNTER — Ambulatory Visit: Payer: 59 | Admitting: Family Medicine

## 2017-03-19 ENCOUNTER — Ambulatory Visit
Admission: RE | Admit: 2017-03-19 | Discharge: 2017-03-19 | Disposition: A | Discharge status: Home / Self Care | Payer: 59 | Source: Ambulatory Visit | Attending: Urology | Admitting: Urology

## 2017-03-19 ENCOUNTER — Telehealth: Payer: Self-pay

## 2017-03-19 DIAGNOSIS — N200 Calculus of kidney: Secondary | ICD-10-CM

## 2017-03-19 DIAGNOSIS — Z87442 Personal history of urinary calculi: Secondary | ICD-10-CM | POA: Insufficient documentation

## 2017-03-19 DIAGNOSIS — Z09 Encounter for follow-up examination after completed treatment for conditions other than malignant neoplasm: Secondary | ICD-10-CM | POA: Insufficient documentation

## 2017-03-19 NOTE — Telephone Encounter (Signed)
Pt called stating she has a hx of kidney stones and feels like she may have another one. Per Dr. Lonna CobbStoioff pt should get a KUB and will follow up from there. Pt voiced understanding. Orders placed.

## 2017-03-21 ENCOUNTER — Telehealth: Payer: Self-pay

## 2017-03-21 NOTE — Telephone Encounter (Signed)
-----   Message from Riki AltesScott C Stoioff, MD sent at 03/20/2017  9:55 AM EST ----- KUB shows no definite evidence of a stone.  If she is having significant pain would recommend a stone protocol CT

## 2017-03-21 NOTE — Telephone Encounter (Signed)
LMOM

## 2017-03-24 ENCOUNTER — Encounter: Payer: Self-pay | Admitting: Urology

## 2017-03-24 ENCOUNTER — Ambulatory Visit: Payer: 59 | Admitting: Urology

## 2017-03-24 VITALS — BP 143/84 | HR 77 | Ht 60.0 in | Wt 200.8 lb

## 2017-03-24 DIAGNOSIS — N3946 Mixed incontinence: Secondary | ICD-10-CM | POA: Diagnosis not present

## 2017-03-24 NOTE — Telephone Encounter (Signed)
Spoke with pt in reference to KUB and CT stone protocol. Pt stated that at the moment she is not having any pain and will wait to have CT.

## 2017-03-24 NOTE — Progress Notes (Signed)
03/24/2017 3:18 PM   Julia NoeCynthia J [name] [birth year] [id number]  Referring provider: Steele Sizerrissman, Mark A, MD [address] [place], [place] [address]  Chief Complaint  Patient presents with  . Urinary Incontinence    HPI: Dr. Lonna CobbStoioff: The patient has failed Myrbetriq and Vesicare.  She had an acute onset of worsening frequency and nocturia x10.  The patient had a normal CT scan.  On October 25 the patient had a normal cystoscopy.  The patient had urodynamics.  There was concerned about the higher risk of retention. Cystoscopy unremarkable with the stress incontinence demonstrated in the supine position.   The patient leaks with coughing and sneezing and bending and lifting as well as walking.  She denies urge incontinence.  She has nighttime bedwetting moderate in severity.  She can void every 45 minutes and cannot sit through a 2-hour movie.  She gets up 8 or 10 times at night.  She voids due to pressure that is relieved when she voids.  She has some urgency but no pelvic pain.    On further questioning she leaks a reasonable amount with stress maneuvers.  As she told [name] during urodynamics if she ignores her bladder she can leak a lot on the way to the restroom.  During urodynamics she did have pressure.  This is how she described her urge to void.  [name] noted that the bladder was stable but had pointed out perhaps one low pressure bladder contraction in the filling phase.  The patient noted she does not go to the restroom when she gets the pressure she can leak a large volume on her way to the bathroom.  At 300 mL her cough leak point pressure was 91 cm of water and it was mild.  At 480 mL her Valsalva leak point pressure was 41 cmH2O and it was mild.  She did have trouble to initiate urination noted.  She generated a weak bladder contraction of 9 cm of water.  It looks like she triggered a contraction while she was trying to urinate.  There was a mild increase in EMG activity during the  voiding phase.  Bladder neck descended 2 cm.  GU: Grade 1 hypermobility of the bladder neck with a mild positive cough test.  No prolapse.  She had a moderate suburethral swelling that was nontender  The patient has mild to moderate stress incontinence but has very impressive frequency and severe nocturia.  She has moderately severe bedwetting.  She also has pressure but no other pelvic pain.  I think she does leak on the way to the restroom and clinically has an overactive bladder that is significant.  She denies ankle edema but takes hydrochlorothiazide.  I am concerned about recommending a sling with her very frequent bladder both day and night.  I do not think there is enough evidence to do a hydrodistention's at this stage but her pressure could worsen after surgery.  She also may be at increased risk of retention as noted and this would need to be discussed.  A picture was drawn.  If she did consent to surgery she would need an MRI prior to rule out an occult urethral diverticulum.  I would need to mention the role of refractory therapies and living with her stress incontinence if she was consented.  Maralyn SagoSarah told me that when the patient stood up she emptied her bladder once again pointing towards a very overactive bladder.  I do believe that if she could live with  her stress incontinence of refractory neuromodulation treatment would be best for her.   The patient was given Toviaz 4 mg samples and prescription.  She understands we will set up goals and discuss refractory therapies for sling next time.  She understands that I believe 70% of her problem is an overactive bladder and she has mild to moderate stress incontinence.  She understands she would need a future MRI if she was going to have a sling  Today Less frequency and urgency but still leaking the same and clinically not infected.  She still has moderately severe stress incontinence.  She still has high-volume bedwetting noted.  I drew  her a picture  We walk through a sling in detail with my usual template.  My concern is persistent overactive bladder symptoms with urge incontinence as noted above and bedwetting or worsening of symptoms.  We talked about the theoretical increase in risk and upper regulation of pressure though I do not think she has interstitial cystitis but she could  We talked about all 3 refractory therapies in detail in my opinion due to her stress incontinence is not a candidate for a study.  My usual template was discussed.     PMH: Past Medical History:  Diagnosis Date  . Asthma   . Depression   . Diabetes mellitus without complication (HCC)   . Hyperlipidemia   . Kidney stones   . Migraine headache     Surgical History: Past Surgical History:  Procedure Laterality Date  . ABDOMINAL HYSTERECTOMY    . BREAST CYST ASPIRATION Right    benign  . BREAST CYST ASPIRATION Right    benign  . CHOLECYSTECTOMY    . COLONOSCOPY  March 2016    Home Medications:  Allergies as of 03/24/2017      Reactions   Amoxicillin Hives      Medication List        Accurate as of 03/24/17  3:18 PM. Always use your most recent med list.          albuterol 108 (90 Base) MCG/ACT inhaler Commonly known as:  PROAIR HFA Inhale 2 puffs into the lungs every 6 (six) hours as needed for wheezing or shortness of breath.   buPROPion 150 MG 12 hr tablet Commonly known as:  WELLBUTRIN SR TAKE ONE TABLET 3 TIMES DAILY   cyclobenzaprine 5 MG tablet Commonly known as:  FLEXERIL   diazepam 5 MG tablet Commonly known as:  VALIUM TAKE ONE TABLET BY MOUTH EVERY DAY AS NEEDED FOR ANXIETY   diphenoxylate-atropine 2.5-0.025 MG tablet Commonly known as:  LOMOTIL TAKE 1 TABLET BY MOUTH 4 TIMES DAILY AS NEEDED FOR DIARRHEA OR LOOSE STOOLS   fesoterodine 4 MG Tb24 tablet Commonly known as:  TOVIAZ Take 1 tablet (4 mg total) daily by mouth.   fluticasone 50 MCG/ACT nasal spray Commonly known as:  FLONASE Place 2  sprays into both nostrils 2 (two) times daily.   Fluticasone-Umeclidin-Vilant 100-62.5-25 MCG/INH Aepb Commonly known as:  TRELEGY ELLIPTA Inhale 1 puff daily into the lungs.   hydrochlorothiazide 25 MG tablet Commonly known as:  HYDRODIURIL TAKE ONE TABLET EVERY DAY   ipratropium-albuterol 0.5-2.5 (3) MG/3ML Soln Commonly known as:  DUONEB Take 3 mLs by nebulization every 6 (six) hours as needed.   mirabegron ER 50 MG Tb24 tablet Commonly known as:  MYRBETRIQ Take 1 tablet (50 mg total) daily by mouth.   montelukast 10 MG tablet Commonly known as:  SINGULAIR TAKE ONE TABLET BY  MOUTH AT BEDTIME   SPIRIVA HANDIHALER 18 MCG inhalation capsule Generic drug:  tiotropium   SYMBICORT 160-4.5 MCG/ACT inhaler Generic drug:  budesonide-formoterol       Allergies:  Allergies  Allergen Reactions  . Amoxicillin Hives    Family History: Family History  Problem Relation Age of Onset  . Hypertension Mother   . Cancer Mother        breast  . Arthritis Mother   . Alzheimer's disease Mother   . Parkinson's disease Mother   . Breast cancer Mother 64  . Hypertension Father   . Diabetes Father   . Kidney cancer Father   . Breast cancer Cousin 35  . Breast cancer Maternal Aunt   . Breast cancer Maternal Grandmother 54    Social History:  reports that  has never smoked. she has never used smokeless tobacco. She reports that she drinks alcohol. She reports that she does not use drugs.  ROS:    Gastrointestinal Indigestion/heartburn?: Yes Diarrhea?: No Constipation?: No  Constitutional Fever: Yes Night sweats?: Yes Weight loss?: No Fatigue?: No  Skin Skin rash/lesions?: No Itching?: No  Eyes Blurred vision?: No Double vision?: No  Ears/Nose/Throat Sore throat?: No Sinus problems?: No  Hematologic/Lymphatic Swollen glands?: No Easy bruising?: No  Cardiovascular Leg swelling?: No Chest pain?: No  Respiratory Cough?: No Shortness of breath?:  No  Endocrine Excessive thirst?: No  Musculoskeletal Back pain?: No Joint pain?: No  Neurological Headaches?: No Dizziness?: No  Psychologic Depression?: No Anxiety?: No  Physical Exam: BP (!) 143/84 (BP Location: Right Arm, Patient Position: Sitting, Cuff Size: Large)   Pulse 77   Ht 5' (1.524 m)   Wt 200 lb 12.8 oz (91.1 kg)   BMI 39.22 kg/m   Constitutional:  Alert and oriented, No acute distress.   Laboratory Data: Lab Results  Component Value Date   WBC 6.0 12/04/2016   HGB 12.8 12/04/2016   HCT 39.9 12/04/2016   MCV 94 12/04/2016   PLT 238 12/04/2016    Lab Results  Component Value Date   CREATININE 0.89 12/04/2016    No results found for: PSA  No results found for: TESTOSTERONE  Lab Results  Component Value Date   HGBA1C 6.1 (H) 09/26/2016    Urinalysis    Component Value Date/Time   COLORURINE YELLOW (A) 02/23/2015 1949   APPEARANCEUR Clear 02/13/2017 1004   LABSPEC 1.019 02/23/2015 1949   PHURINE 5.0 02/23/2015 1949   GLUCOSEU Negative 02/13/2017 1004   HGBUR 3+ (A) 02/23/2015 1949   BILIRUBINUR Negative 02/13/2017 1004   KETONESUR NEGATIVE 02/23/2015 1949   PROTEINUR Negative 02/13/2017 1004   PROTEINUR 30 (A) 02/23/2015 1949   NITRITE Negative 02/13/2017 1004   NITRITE NEGATIVE 02/23/2015 1949   LEUKOCYTESUR Negative 02/13/2017 1004    Pertinent Imaging: none  Assessment & Plan: The patient was given handouts of refractory therapies.  We talked about if she does great with a sling this would be an excellent treatment but my concerns are noted above.  She does report that she could live with her stress incontinence and I think would like to proceed with InterStim.  Of course she can evaluate the results from the test which is an advantage especially with her mixed picture.  The test will be done in [place] but she understands I will be happy to do the implant here but she does work and live apparently in [place] and came here to  get in faster.  If we can greatly  improve her overactive bladder component a sling can always be done as an outpatient afterwards.  I think she is very well educated on the subject and wanted to know what I would do.  I think she has made a good choice for herself  25 minutes or longer was used face-to-face for these discussions  I will have Steward Drone call her and we will proceed accordingly  There are no diagnoses linked to this encounter.  No Follow-up on file.  Martina Sinner, MD  [facility] [address] [place], [place] [address] [phone]

## 2017-03-25 ENCOUNTER — Encounter: Payer: Self-pay | Admitting: Family Medicine

## 2017-03-31 ENCOUNTER — Ambulatory Visit: Payer: 59

## 2017-05-08 ENCOUNTER — Encounter: Payer: Self-pay | Admitting: Family Medicine

## 2017-05-08 NOTE — Telephone Encounter (Signed)
Appointment scheduled.

## 2017-05-09 ENCOUNTER — Encounter: Payer: Self-pay | Admitting: Family Medicine

## 2017-05-09 ENCOUNTER — Ambulatory Visit: Payer: 59 | Admitting: Family Medicine

## 2017-05-09 VITALS — BP 138/74 | HR 64 | Temp 98.4°F | Wt 203.4 lb

## 2017-05-09 DIAGNOSIS — R059 Cough, unspecified: Secondary | ICD-10-CM

## 2017-05-09 DIAGNOSIS — J111 Influenza due to unidentified influenza virus with other respiratory manifestations: Secondary | ICD-10-CM

## 2017-05-09 DIAGNOSIS — R509 Fever, unspecified: Secondary | ICD-10-CM | POA: Diagnosis not present

## 2017-05-09 DIAGNOSIS — R05 Cough: Secondary | ICD-10-CM

## 2017-05-09 LAB — VERITOR FLU A/B WAIVED
Influenza A: NEGATIVE
Influenza B: NEGATIVE

## 2017-05-09 MED ORDER — BENZONATATE 200 MG PO CAPS: 200.0000 mg | ORAL_CAPSULE | Freq: Two times a day (BID) | ORAL | 0 refills | Status: DC | PRN

## 2017-05-09 MED ORDER — HYDROCOD POLST-CPM POLST ER 10-8 MG/5ML PO SUER: 5.0000 mL | Freq: Every evening | ORAL | 0 refills | Status: DC | PRN

## 2017-05-09 MED ORDER — OSELTAMIVIR PHOSPHATE 75 MG PO CAPS: 75.0000 mg | ORAL_CAPSULE | Freq: Two times a day (BID) | ORAL | 0 refills | Status: DC

## 2017-05-09 NOTE — Progress Notes (Signed)
BP 138/74 (BP Location: Left Arm, Patient Position: Sitting, Cuff Size: Large)   Pulse 64   Temp 98.4 F (36.9 C)   Wt 203 lb 6 oz (92.3 kg)   SpO2 98%   BMI 39.72 kg/m    Subjective:    Patient ID: [name], female    DOB: Dec 21, 1955, 62 y.o.   MRN: [id number]  HPI: [name] is a 62 y.o. female  Chief Complaint  Patient presents with  . URI    x 2-3 days, fever cough bodyaches nasal congestion   UPPER RESPIRATORY TRACT INFECTION Duration: 2 days Worst symptom: body aches Fever: yes Cough: yes Shortness of breath: no Wheezing: no Chest pain: yes, with cough Chest tightness: no Chest congestion: yes Nasal congestion: yes Runny nose: yes Post nasal drip: yes Sneezing: no Sore throat: yes Swollen glands: no Sinus pressure: no Headache: no Face pain: no Toothache: no Ear pain: no  Ear pressure: no  Eyes red/itching:no Eye drainage/crusting: no  Vomiting: no Rash: no Fatigue: yes Sick contacts: yes Strep contacts: no  Context: worse Recurrent sinusitis: no Relief with OTC cold/cough medications: no  Treatments attempted: none  Relevant past medical, surgical, family and social history reviewed and updated as indicated. Interim medical history since our last visit reviewed. Allergies and medications reviewed and updated.  Review of Systems  Constitutional: Positive for chills, fatigue and fever. Negative for activity change, appetite change, diaphoresis and unexpected weight change.  HENT: Positive for congestion, postnasal drip, rhinorrhea and sore throat. Negative for dental problem, drooling, ear discharge, ear pain, facial swelling, hearing loss, mouth sores, nosebleeds, sinus pressure, sinus pain, sneezing, tinnitus, trouble swallowing and voice change.   Respiratory: Positive for cough. Negative for apnea, choking, chest tightness, shortness of breath, wheezing and stridor.   Cardiovascular: Negative.   Musculoskeletal: Positive for  myalgias. Negative for arthralgias, back pain, gait problem, joint swelling, neck pain and neck stiffness.  Psychiatric/Behavioral: Negative.     Per HPI unless specifically indicated above     Objective:    BP 138/74 (BP Location: Left Arm, Patient Position: Sitting, Cuff Size: Large)   Pulse 64   Temp 98.4 F (36.9 C)   Wt 203 lb 6 oz (92.3 kg)   SpO2 98%   BMI 39.72 kg/m   Wt Readings from Last 3 Encounters:  05/09/17 203 lb 6 oz (92.3 kg)  03/24/17 200 lb 12.8 oz (91.1 kg)  03/03/17 201 lb 1.6 oz (91.2 kg)    Physical Exam  Constitutional: She is oriented to person, place, and time. She appears well-developed and well-nourished. No distress.  HENT:  Head: Normocephalic and atraumatic.  Right Ear: Hearing and external ear normal.  Left Ear: Hearing and external ear normal.  Nose: Nose normal.  Mouth/Throat: Oropharynx is clear and moist. No oropharyngeal exudate.  Eyes: Conjunctivae, EOM and lids are normal. Pupils are equal, round, and reactive to light. Right eye exhibits no discharge. Left eye exhibits no discharge. No scleral icterus.  Neck: Normal range of motion. Neck supple. No JVD present. No tracheal deviation present. No thyromegaly present.  Cardiovascular: Normal rate, regular rhythm, normal heart sounds and intact distal pulses. Exam reveals no gallop and no friction rub.  No murmur heard. Pulmonary/Chest: Effort normal and breath sounds normal. No stridor. No respiratory distress. She has no wheezes. She has no rales. She exhibits no tenderness.  Musculoskeletal: Normal range of motion.  Lymphadenopathy:    She has no cervical adenopathy.  Neurological:  She is alert and oriented to person, place, and time.  Skin: Skin is warm, dry and intact. No rash noted. She is not diaphoretic. No erythema. No pallor.  Psychiatric: She has a normal mood and affect. Her speech is normal and behavior is normal. Judgment and thought content normal. Cognition and memory are  normal.  Nursing note and vitals reviewed.   Results for orders placed or performed in visit on 02/13/17  Urinalysis, Complete  Result Value Ref Range   Specific Gravity, UA 1.015 1.005 - 1.030   pH, UA 7.0 5.0 - 7.5   Color, UA Yellow Yellow   Appearance Ur Clear Clear   Leukocytes, UA Negative Negative   Protein, UA Negative Negative/Trace   Glucose, UA Negative Negative   Ketones, UA Negative Negative   RBC, UA Negative Negative   Bilirubin, UA Negative Negative   Urobilinogen, Ur 0.2 0.2 - 1.0 mg/dL   Nitrite, UA Negative Negative      Assessment & Plan:   Problem List Items Addressed This Visit    None    Visit Diagnoses    Influenza    -  Primary   Will treat with tamiflu, tussinex and tessalon. Call with any concerns. Out of work for 7 days.    Relevant Medications   oseltamivir (TAMIFLU) 75 MG capsule   Fever, unspecified fever cause       Flu negative- but given symptoms strong suspician and will treat.   Relevant Orders   Veritor Flu A/B Waived   Cough       Flu negative- but given symptoms strong suspician and will treat.   Relevant Orders   Veritor Flu A/B Waived       Follow up plan: Return if symptoms worsen or fail to improve.

## 2017-05-09 NOTE — Patient Instructions (Addendum)

## 2017-06-30 ENCOUNTER — Telehealth: Payer: 59 | Admitting: Family

## 2017-06-30 DIAGNOSIS — J111 Influenza due to unidentified influenza virus with other respiratory manifestations: Secondary | ICD-10-CM

## 2017-06-30 MED ORDER — OSELTAMIVIR PHOSPHATE 75 MG PO CAPS: 75.0000 mg | ORAL_CAPSULE | Freq: Two times a day (BID) | ORAL | 0 refills | Status: DC

## 2017-06-30 MED ORDER — BENZONATATE 100 MG PO CAPS: 100.0000 mg | ORAL_CAPSULE | Freq: Three times a day (TID) | ORAL | 0 refills | Status: DC | PRN

## 2017-06-30 NOTE — Progress Notes (Signed)
Thank you for the details you included in the comment boxes. Those details are very helpful in determining the best course of treatment for you and help us to provide the best care.  Thank you for the information from our telephone call. See below.   E visit for Flu like symptoms   We are sorry that you are not feeling well.  Here is how we plan to help! Based on what you have shared with me it looks like you may have possible exposure to a virus that causes influenza. as you indicated the person you were exposed to was just diagnosed with flu this morning also. As you requested, I am also sending tessalon perles 100mg , take 1 or 2 every 8 hours for cough.   Influenza or "the flu" is   an infection caused by a respiratory virus. The flu virus is highly contagious and persons who did not receive their yearly flu vaccination may "catch" the flu from close contact.  We have anti-viral medications to treat the viruses that cause this infection. They are not a "cure" and only shorten the course of the infection. These prescriptions are most effective when they are given within the first 2 days of "flu" symptoms. Antiviral medication are indicated if you have a high risk of complications from the flu. You should  also consider an antiviral medication if you are in close contact with someone who is at risk. These medications can help patients avoid complications from the flu  but have side effects that you should know. Possible side effects from Tamiflu or oseltamivir include nausea, vomiting, diarrhea, dizziness, headaches, eye redness, sleep problems or other respiratory symptoms. You should not take Tamiflu if you have an allergy to oseltamivir or any to the ingredients in Tamiflu.  Based upon your symptoms and potential risk factors I have prescribed Oseltamivir (Tamiflu).  It has been sent to your designated pharmacy.  You will take one 75 mg capsule orally twice a day for the next 5 days.  ANYONE WHO  HAS FLU SYMPTOMS SHOULD: . Stay home. The flu is highly contagious and going out or to work exposes others! . Be sure to drink plenty of fluids. Water is fine as well as fruit juices, sodas and electrolyte beverages. You may want to stay away from caffeine or alcohol. If you are nauseated, try taking small sips of liquids. How do you know if you are getting enough fluid? Your urine should be a pale yellow or almost colorless. . Get rest. . Taking a steamy shower or using a humidifier may help nasal congestion and ease sore throat pain. Using a saline nasal spray works much the same way. . Cough drops, hard candies and sore throat lozenges may ease your cough. . Line up a caregiver. Have someone check on you regularly.   GET HELP RIGHT AWAY IF: . You cannot keep down liquids or your medications. . You become short of breath . Your fell like you are going to pass out or loose consciousness. . Your symptoms persist after you have completed your treatment plan MAKE SURE YOU   Understand these instructions.  Will watch your condition.   Will get help right away if you are not doing well or get worse.  Your e-visit answers were reviewed by a board certified advanced clinical practitioner to complete your personal care plan.  Depending on the condition, your plan could have included both over the counter or prescription medications.  If there is  a problem please reply  once you have received a response from your provider.  Your safety is important to [place].  If you have drug allergies check your prescription carefully.    You can use MyChart to ask questions about today's visit, request a non-urgent call back, or ask for a work or school excuse for 24 hours related to this e-Visit. If it has been greater than 24 hours you will need to follow up with your provider, or enter a new e-Visit to address those concerns.  You will get an e-mail in the next two days asking about your experience.  I hope  that your e-visit has been valuable and will speed your recovery. Thank you for using e-visits.

## 2017-07-01 ENCOUNTER — Encounter: Payer: Self-pay | Admitting: Family Medicine

## 2017-07-01 ENCOUNTER — Ambulatory Visit (INDEPENDENT_AMBULATORY_CARE_PROVIDER_SITE_OTHER): Payer: 59 | Admitting: Family Medicine

## 2017-07-01 ENCOUNTER — Telehealth: Payer: Self-pay | Admitting: Family Medicine

## 2017-07-01 ENCOUNTER — Ambulatory Visit
Admission: RE | Admit: 2017-07-01 | Discharge: 2017-07-01 | Disposition: A | Discharge status: Home / Self Care | Payer: 59 | Source: Ambulatory Visit | Attending: Family Medicine | Admitting: Family Medicine

## 2017-07-01 VITALS — BP 104/70 | HR 79 | Temp 99.5°F | Wt 209.5 lb

## 2017-07-01 DIAGNOSIS — J4541 Moderate persistent asthma with (acute) exacerbation: Secondary | ICD-10-CM | POA: Diagnosis not present

## 2017-07-01 DIAGNOSIS — J111 Influenza due to unidentified influenza virus with other respiratory manifestations: Secondary | ICD-10-CM | POA: Diagnosis not present

## 2017-07-01 DIAGNOSIS — I517 Cardiomegaly: Secondary | ICD-10-CM | POA: Diagnosis not present

## 2017-07-01 DIAGNOSIS — R05 Cough: Secondary | ICD-10-CM | POA: Diagnosis present

## 2017-07-01 DIAGNOSIS — R509 Fever, unspecified: Secondary | ICD-10-CM | POA: Diagnosis present

## 2017-07-01 DIAGNOSIS — R918 Other nonspecific abnormal finding of lung field: Secondary | ICD-10-CM | POA: Insufficient documentation

## 2017-07-01 MED ORDER — PREDNISONE 10 MG PO TABS: ORAL_TABLET | ORAL | 0 refills | Status: DC

## 2017-07-01 MED ORDER — HYDROCOD POLST-CPM POLST ER 10-8 MG/5ML PO SUER: 5.0000 mL | Freq: Two times a day (BID) | ORAL | 0 refills | Status: DC | PRN

## 2017-07-01 MED ORDER — DOXYCYCLINE HYCLATE 100 MG PO TABS: 100.0000 mg | ORAL_TABLET | Freq: Two times a day (BID) | ORAL | 0 refills | Status: DC

## 2017-07-01 NOTE — Progress Notes (Signed)
BP 104/70 (BP Location: Left Arm, Patient Position: Sitting, Cuff Size: Normal)   Pulse 79   Temp 99.5 F (37.5 C) (Oral)   Wt 209 lb 8 oz (95 kg)   SpO2 97%   BMI 40.92 kg/m    Subjective:    Patient ID: Arlys JohnCynthia J Julin, female    DOB: 06/25/1955, 62 y.o.   MRN: [id number]  HPI: Arlys JohnCynthia J Huezo is a 62 y.o. female  Chief Complaint  Patient presents with  . Cough    x's 4 days and has progressively got worse.  . Fever   Pt here today with 4 days of fever (102 last night), body aches, productive cough of green sputum, chest tightness, SOB, sore throat, congestion. States she had something similar over a month ago and never fully resolved, especially in her chest. Did evisit yesterday and was given tamiflu and tessalon perles. No relief with tessalon perles.  Alternating advil and tylenol for fever, last dose 3 hours ago. Denies N/V/D, CP, ear pain.   Relevant past medical, surgical, family and social history reviewed and updated as indicated. Interim medical history since our last visit reviewed. Allergies and medications reviewed and updated.  Review of Systems  Per HPI unless specifically indicated above     Objective:    BP 104/70 (BP Location: Left Arm, Patient Position: Sitting, Cuff Size: Normal)   Pulse 79   Temp 99.5 F (37.5 C) (Oral)   Wt 209 lb 8 oz (95 kg)   SpO2 97%   BMI 40.92 kg/m   Wt Readings from Last 3 Encounters:  07/01/17 209 lb 8 oz (95 kg)  05/09/17 203 lb 6 oz (92.3 kg)  03/24/17 200 lb 12.8 oz (91.1 kg)    Physical Exam  Constitutional: She is oriented to person, place, and time. She appears well-developed and well-nourished. No distress.  HENT:  Head: Atraumatic.  Right Ear: External ear normal.  Left Ear: External ear normal.  Mouth/Throat: No oropharyngeal exudate.  Oropharynx and nasal mucosa erythematous and mildly edematous  Eyes: Conjunctivae are normal. Pupils are equal, round, and reactive to light.  Neck: Normal range  of motion. Neck supple.  Cardiovascular: Normal rate, regular rhythm and normal heart sounds.  Pulmonary/Chest: Effort normal. No respiratory distress. She has wheezes.  Musculoskeletal: Normal range of motion.  Lymphadenopathy:    She has cervical adenopathy.  Neurological: She is alert and oriented to person, place, and time.  Skin: Skin is warm and dry.  Psychiatric: She has a normal mood and affect. Her behavior is normal.  Nursing note and vitals reviewed.   Results for orders placed or performed in visit on 05/09/17  Veritor Flu A/B Waived  Result Value Ref Range   Influenza A Negative Negative   Influenza B Negative Negative      Assessment & Plan:   Problem List Items Addressed This Visit      Respiratory   Asthma    Continue home inhaler regimen. Await x-ray report, will tx depending on results with prednisone and possible an antibiotic. Continue home supportive care.        Other Visit Diagnoses    Influenza    -  Primary   Dx'd during e-visit yesterday, continue tamiflu, tessalon. Will get CXR as pt with persistent productive cough and asthma exac. Start tussionex, await CXR   Relevant Orders   DG Chest 2 View       Follow up plan: Return if symptoms worsen or fail  to improve.

## 2017-07-01 NOTE — Patient Instructions (Signed)
Follow up as needed

## 2017-07-01 NOTE — Assessment & Plan Note (Signed)
Continue home inhaler regimen. Await x-ray report, will tx depending on results with prednisone and possible an antibiotic. Continue home supportive care.

## 2017-07-01 NOTE — Telephone Encounter (Signed)
Called pt to review CXR results, discussed adding prednisone and doxycycline to the tussionex and getting some plain mucinex OTC. Pt aware to call with worsening sxs.

## 2017-07-03 ENCOUNTER — Ambulatory Visit: Payer: 59 | Admitting: Family Medicine

## 2017-07-07 ENCOUNTER — Encounter: Payer: Self-pay | Admitting: Family Medicine

## 2017-07-08 NOTE — Telephone Encounter (Signed)
Please place order since it is not routine.

## 2017-07-09 ENCOUNTER — Other Ambulatory Visit: Payer: Self-pay | Admitting: Family Medicine

## 2017-07-10 NOTE — Telephone Encounter (Signed)
Hyoscyamine Sulfate refill Last OV: 05/12/15 Last Refill:12/04/16  Pharmacy:[facility] PCP: Dr. Dossie Arbourrissman  Lomotil refill Last OV: 12/04/16 Last Refill:12/25/16 Pharmacy:[facility] PCP: Dr Dossie Arbourrissman

## 2017-07-11 ENCOUNTER — Other Ambulatory Visit: Payer: Self-pay

## 2017-07-11 DIAGNOSIS — N644 Mastodynia: Secondary | ICD-10-CM

## 2017-07-14 ENCOUNTER — Institutional Professional Consult (permissible substitution): Payer: 59 | Admitting: Internal Medicine

## 2017-07-15 ENCOUNTER — Encounter: Payer: Self-pay | Admitting: Internal Medicine

## 2017-07-16 ENCOUNTER — Ambulatory Visit
Admission: RE | Admit: 2017-07-16 | Discharge: 2017-07-16 | Disposition: A | Discharge status: Home / Self Care | Payer: 59 | Source: Ambulatory Visit | Attending: Family Medicine | Admitting: Family Medicine

## 2017-07-16 DIAGNOSIS — N644 Mastodynia: Secondary | ICD-10-CM

## 2017-07-31 ENCOUNTER — Other Ambulatory Visit: Payer: Self-pay | Admitting: Family Medicine

## 2017-08-19 ENCOUNTER — Other Ambulatory Visit: Payer: Self-pay | Admitting: Family Medicine

## 2017-08-29 ENCOUNTER — Other Ambulatory Visit: Payer: Self-pay | Admitting: Family Medicine

## 2017-09-01 NOTE — Telephone Encounter (Signed)
Singulair 10 mg refill request  LOV 07/01/17 with Evalyn Casco  Last refill:  09/25/16  #[phone]  [facility] - [place], [place] - [address]

## 2017-09-18 ENCOUNTER — Encounter: Payer: Self-pay | Admitting: Family Medicine

## 2017-09-18 ENCOUNTER — Ambulatory Visit: Payer: 59 | Admitting: Family Medicine

## 2017-09-18 VITALS — BP 138/86 | HR 83 | Wt 205.4 lb

## 2017-09-18 DIAGNOSIS — E119 Type 2 diabetes mellitus without complications: Secondary | ICD-10-CM

## 2017-09-18 DIAGNOSIS — J111 Influenza due to unidentified influenza virus with other respiratory manifestations: Secondary | ICD-10-CM

## 2017-09-18 DIAGNOSIS — J441 Chronic obstructive pulmonary disease with (acute) exacerbation: Secondary | ICD-10-CM

## 2017-09-18 DIAGNOSIS — N3946 Mixed incontinence: Secondary | ICD-10-CM

## 2017-09-18 MED ORDER — BENZONATATE 100 MG PO CAPS: 100.0000 mg | ORAL_CAPSULE | Freq: Three times a day (TID) | ORAL | 0 refills | Status: DC | PRN

## 2017-09-18 MED ORDER — ALBUTEROL SULFATE HFA 108 (90 BASE) MCG/ACT IN AERS: 2.0000 | INHALATION_SPRAY | Freq: Four times a day (QID) | RESPIRATORY_TRACT | 11 refills | Status: DC | PRN

## 2017-09-18 MED ORDER — HYDROCOD POLST-CPM POLST ER 10-8 MG/5ML PO SUER: 5.0000 mL | Freq: Two times a day (BID) | ORAL | 0 refills | Status: DC | PRN

## 2017-09-18 MED ORDER — AZITHROMYCIN 250 MG PO TABS: ORAL_TABLET | ORAL | 0 refills | Status: DC

## 2017-09-18 NOTE — Progress Notes (Signed)
BP 138/86   Pulse 83   Wt 205 lb 6.4 oz (93.2 kg)   SpO2 98%   BMI 40.11 kg/m    Subjective:    Patient ID: [name], female    DOB: 26-Sep-1955, 62 y.o.   MRN: [id number]  HPI: [name] is a 62 y.o. female  Chief Complaint  Patient presents with  . Cough    Productive x 3 days  . Diabetes   UPPER RESPIRATORY TRACT INFECTION Duration: 3 days Worst symptom: cough Fever: yes Cough: yes Shortness of breath: yes Wheezing: yes Chest pain: no Chest tightness: yes Chest congestion: yes Nasal congestion: yes Runny nose: yes Post nasal drip: yes Sneezing: no Sore throat: yes Swollen glands: no Sinus pressure: no Headache: no Face pain: no Toothache: no Ear pain: no  Ear pressure: no  Eyes red/itching:yes Eye drainage/crusting: yes  Vomiting: no Rash: no Fatigue: yes Sick contacts: no Strep contacts: no  Context: worse Recurrent sinusitis: no Relief with OTC cold/cough medications: no  Treatments attempted: anti-histamine   DIABETES Hypoglycemic episodes:no Polydipsia/polyuria: yes Visual disturbance: no Chest pain: no Paresthesias: no Glucose Monitoring: no Taking Insulin?: no Blood Pressure Monitoring: not checking Retinal Examination: Not up to Date Foot Exam: Up to Date Diabetic Education: Completed Pneumovax: Up to Date Influenza: Up to Date Aspirin: no  Relevant past medical, surgical, family and social history reviewed and updated as indicated. Interim medical history since our last visit reviewed. Allergies and medications reviewed and updated.  Review of Systems  Constitutional: Negative.   HENT: Positive for congestion, postnasal drip, rhinorrhea and sinus pain. Negative for dental problem, drooling, ear discharge, ear pain, facial swelling, hearing loss, mouth sores, nosebleeds, sinus pressure, sneezing, sore throat, tinnitus, trouble swallowing and voice change.   Respiratory: Positive for cough, shortness of breath  and wheezing. Negative for apnea, choking, chest tightness and stridor.   Cardiovascular: Negative.   Musculoskeletal: Negative.   Skin: Negative.   Psychiatric/Behavioral: Negative.     Per HPI unless specifically indicated above     Objective:    BP 138/86   Pulse 83   Wt 205 lb 6.4 oz (93.2 kg)   SpO2 98%   BMI 40.11 kg/m   Wt Readings from Last 3 Encounters:  09/18/17 205 lb 6.4 oz (93.2 kg)  07/01/17 209 lb 8 oz (95 kg)  05/09/17 203 lb 6 oz (92.3 kg)    Physical Exam  Constitutional: She is oriented to person, place, and time. She appears well-developed and well-nourished. No distress.  HENT:  Head: Normocephalic and atraumatic.  Right Ear: Hearing and external ear normal.  Left Ear: Hearing and external ear normal.  Nose: Nose normal.  Mouth/Throat: Oropharynx is clear and moist. No oropharyngeal exudate.  Eyes: Pupils are equal, round, and reactive to light. Conjunctivae, EOM and lids are normal. Right eye exhibits no discharge. Left eye exhibits no discharge. No scleral icterus.  Neck: Normal range of motion. Neck supple. No JVD present. No tracheal deviation present. No thyromegaly present.  Cardiovascular: Normal rate, regular rhythm, normal heart sounds and intact distal pulses. Exam reveals no gallop and no friction rub.  No murmur heard. Pulmonary/Chest: Effort normal. No stridor. No respiratory distress. She has wheezes. She has no rales. She exhibits no tenderness.  Musculoskeletal: Normal range of motion.  Lymphadenopathy:    She has no cervical adenopathy.  Neurological: She is alert and oriented to person, place, and time.  Skin: Skin is warm, dry and intact.  Capillary refill takes less than 2 seconds. No rash noted. She is not diaphoretic. No erythema. No pallor.  Psychiatric: She has a normal mood and affect. Her speech is normal and behavior is normal. Judgment and thought content normal. Cognition and memory are normal.  Nursing note and vitals  reviewed.   Results for orders placed or performed in visit on 09/18/17  Bayer DCA Hb A1c Waived  Result Value Ref Range   HB A1C (BAYER DCA - WAIVED) 5.5 <7.0 %  Microalbumin, Urine Waived  Result Value Ref Range   Microalb, Ur Waived 10 0 - 19 mg/L   Creatinine, Urine Waived 100 10 - 300 mg/dL   Microalb/Creat Ratio <30 <30 mg/g      Assessment & Plan:   Problem List Items Addressed This Visit      Endocrine   Diabetes mellitus without complication (HCC)    Under good control with A1c of 5.5. Continue current regimen. Continue to monitor. Call with any concerns.       Relevant Orders   Bayer DCA Hb A1c Waived (Completed)   Microalbumin, Urine Waived (Completed)    Other Visit Diagnoses    COPD exacerbation (HCC)    -  Primary   Will treat with azithromycin and tessalon perles. Call with any concerns. Continue to monitor.    Relevant Medications   azithromycin (ZITHROMAX) 250 MG tablet   benzonatate (TESSALON PERLES) 100 MG capsule   chlorpheniramine-HYDROcodone (TUSSIONEX PENNKINETIC ER) 10-8 MG/5ML SUER   albuterol (PROAIR HFA) 108 (90 Base) MCG/ACT inhaler   Influenza       Relevant Medications   azithromycin (ZITHROMAX) 250 MG tablet   benzonatate (TESSALON PERLES) 100 MG capsule   Mixed incontinence urge and stress       Would like to see urology. Referral generated today.   Relevant Orders   Ambulatory referral to Urology       Follow up plan: Return in about 2 weeks (around 10/02/2017) for Lung recheck.

## 2017-09-19 ENCOUNTER — Encounter: Payer: Self-pay | Admitting: Family Medicine

## 2017-09-19 LAB — MICROALBUMIN, URINE WAIVED
Creatinine, Urine Waived: 100 mg/dL (ref 10–300)
Microalb, Ur Waived: 10 mg/L (ref 0–19)

## 2017-09-19 LAB — BAYER DCA HB A1C WAIVED: HB A1C (BAYER DCA - WAIVED): 5.5 % (ref <7.0)

## 2017-09-19 MED ORDER — PREDNISONE 50 MG PO TABS: 50.0000 mg | ORAL_TABLET | Freq: Every day | ORAL | 0 refills | Status: DC

## 2017-09-20 ENCOUNTER — Encounter: Payer: Self-pay | Admitting: Family Medicine

## 2017-09-20 NOTE — Assessment & Plan Note (Signed)
Under good control with A1c of 5.5. Continue current regimen. Continue to monitor. Call with any concerns.

## 2017-10-07 ENCOUNTER — Encounter: Payer: Self-pay | Admitting: Family Medicine

## 2017-10-07 ENCOUNTER — Ambulatory Visit (INDEPENDENT_AMBULATORY_CARE_PROVIDER_SITE_OTHER): Payer: 59 | Admitting: Family Medicine

## 2017-10-07 VITALS — BP 130/84 | HR 82 | Temp 98.6°F | Ht 58.1 in | Wt 204.4 lb

## 2017-10-07 DIAGNOSIS — K760 Fatty (change of) liver, not elsewhere classified: Secondary | ICD-10-CM

## 2017-10-07 DIAGNOSIS — Z0001 Encounter for general adult medical examination with abnormal findings: Secondary | ICD-10-CM | POA: Diagnosis not present

## 2017-10-07 DIAGNOSIS — J42 Unspecified chronic bronchitis: Secondary | ICD-10-CM | POA: Diagnosis not present

## 2017-10-07 DIAGNOSIS — I10 Essential (primary) hypertension: Secondary | ICD-10-CM | POA: Diagnosis not present

## 2017-10-07 DIAGNOSIS — F321 Major depressive disorder, single episode, moderate: Secondary | ICD-10-CM | POA: Diagnosis not present

## 2017-10-07 DIAGNOSIS — J441 Chronic obstructive pulmonary disease with (acute) exacerbation: Secondary | ICD-10-CM | POA: Diagnosis not present

## 2017-10-07 DIAGNOSIS — Z6841 Body Mass Index (BMI) 40.0 and over, adult: Secondary | ICD-10-CM

## 2017-10-07 DIAGNOSIS — N3946 Mixed incontinence: Secondary | ICD-10-CM | POA: Diagnosis not present

## 2017-10-07 DIAGNOSIS — Z0000 Encounter for general adult medical examination without abnormal findings: Secondary | ICD-10-CM

## 2017-10-07 DIAGNOSIS — F3342 Major depressive disorder, recurrent, in full remission: Secondary | ICD-10-CM | POA: Diagnosis not present

## 2017-10-07 DIAGNOSIS — E119 Type 2 diabetes mellitus without complications: Secondary | ICD-10-CM

## 2017-10-07 DIAGNOSIS — J4541 Moderate persistent asthma with (acute) exacerbation: Secondary | ICD-10-CM

## 2017-10-07 LAB — UA/M W/RFLX CULTURE, ROUTINE
Bilirubin, UA: NEGATIVE
GLUCOSE, UA: NEGATIVE
Ketones, UA: NEGATIVE
LEUKOCYTES UA: NEGATIVE
Nitrite, UA: NEGATIVE
PROTEIN UA: NEGATIVE
RBC, UA: NEGATIVE
Specific Gravity, UA: 1.01 (ref 1.005–1.030)
Urobilinogen, Ur: 0.2 mg/dL (ref 0.2–1.0)
pH, UA: 7 (ref 5.0–7.5)

## 2017-10-07 MED ORDER — FLUTICASONE-UMECLIDIN-VILANT 100-62.5-25 MCG/INH IN AEPB: 1.0000 | INHALATION_SPRAY | Freq: Every day | RESPIRATORY_TRACT | 3 refills | Status: DC

## 2017-10-07 MED ORDER — HYOSCYAMINE SULFATE 0.125 MG SL SUBL: SUBLINGUAL_TABLET | SUBLINGUAL | 1 refills | Status: DC

## 2017-10-07 MED ORDER — IPRATROPIUM-ALBUTEROL 0.5-2.5 (3) MG/3ML IN SOLN: 3.0000 mL | Freq: Four times a day (QID) | RESPIRATORY_TRACT | 3 refills | Status: DC | PRN

## 2017-10-07 MED ORDER — FESOTERODINE FUMARATE ER 4 MG PO TB24: 4.0000 mg | ORAL_TABLET | Freq: Every day | ORAL | 1 refills | Status: DC

## 2017-10-07 MED ORDER — BUPROPION HCL ER (SR) 150 MG PO TB12: 150.0000 mg | ORAL_TABLET | Freq: Three times a day (TID) | ORAL | 1 refills | Status: DC

## 2017-10-07 MED ORDER — MONTELUKAST SODIUM 10 MG PO TABS: 10.0000 mg | ORAL_TABLET | Freq: Every day | ORAL | 3 refills | Status: DC

## 2017-10-07 MED ORDER — HYDROCHLOROTHIAZIDE 25 MG PO TABS: 25.0000 mg | ORAL_TABLET | Freq: Every day | ORAL | 1 refills | Status: DC

## 2017-10-07 MED ORDER — ALBUTEROL SULFATE HFA 108 (90 BASE) MCG/ACT IN AERS: 2.0000 | INHALATION_SPRAY | Freq: Four times a day (QID) | RESPIRATORY_TRACT | 11 refills | Status: DC | PRN

## 2017-10-07 NOTE — Assessment & Plan Note (Signed)
Under good control. Continue current regimen. Continue to monitor. Call with any concerns. Refills given.  

## 2017-10-07 NOTE — Patient Instructions (Addendum)
Pulmonary Appointment Dr. Ashby Dawes Thursday, June 27 10:45 AM [facility], [place], [place], [place], [place] [address]   [phone]   Health Maintenance for Postmenopausal Women Menopause is a normal process in which your reproductive ability comes to an end. This process happens gradually over a span of months to years, usually between the ages of 1 and 25. Menopause is complete when you have missed 12 consecutive menstrual periods. It is important to talk with your health care provider about some of the most common conditions that affect postmenopausal women, such as heart disease, cancer, and bone loss (osteoporosis). Adopting a healthy lifestyle and getting preventive care can help to promote your health and wellness. Those actions can also lower your chances of developing some of these common conditions. What should I know about menopause? During menopause, you may experience a number of symptoms, such as:  Moderate-to-severe hot flashes.  Night sweats.  Decrease in sex drive.  Mood swings.  Headaches.  Tiredness.  Irritability.  Memory problems.  Insomnia.  Choosing to treat or not to treat menopausal changes is an individual decision that you make with your health care provider. What should I know about hormone replacement therapy and supplements? Hormone therapy products are effective for treating symptoms that are associated with menopause, such as hot flashes and night sweats. Hormone replacement carries certain risks, especially as you become older. If you are thinking about using estrogen or estrogen with progestin treatments, discuss the benefits and risks with your health care provider. What should I know about heart disease and stroke? Heart disease, heart attack, and stroke become more likely as you age. This may be due, in part, to the hormonal changes that your body experiences during menopause. These can affect how your body processes  dietary fats, triglycerides, and cholesterol. Heart attack and stroke are both medical emergencies. There are many things that you can do to help prevent heart disease and stroke:  Have your blood pressure checked at least every 1-2 years. High blood pressure causes heart disease and increases the risk of stroke.  If you are 74-23 years old, ask your health care provider if you should take aspirin to prevent a heart attack or a stroke.  Do not use any tobacco products, including cigarettes, chewing tobacco, or electronic cigarettes. If you need help quitting, ask your health care provider.  It is important to eat a healthy diet and maintain a healthy weight. ? Be sure to include plenty of vegetables, fruits, low-fat dairy products, and lean protein. ? Avoid eating foods that are high in solid fats, added sugars, or salt (sodium).  Get regular exercise. This is one of the most important things that you can do for your health. ? Try to exercise for at least 150 minutes each week. The type of exercise that you do should increase your heart rate and make you sweat. This is known as moderate-intensity exercise. ? Try to do strengthening exercises at least twice each week. Do these in addition to the moderate-intensity exercise.  Know your numbers.Ask your health care provider to check your cholesterol and your blood glucose. Continue to have your blood tested as directed by your health care provider.  What should I know about cancer screening? There are several types of cancer. Take the following steps to reduce your risk and to catch any cancer development as early as possible. Breast Cancer  Practice breast self-awareness. ? This means understanding how your breasts normally appear and  feel. ? It also means doing regular breast self-exams. Let your health care provider know about any changes, no matter how small.  If you are 32 or older, have a clinician do a breast exam (clinical breast  exam or CBE) every year. Depending on your age, family history, and medical history, it may be recommended that you also have a yearly breast X-ray (mammogram).  If you have a family history of breast cancer, talk with your health care provider about genetic screening.  If you are at high risk for breast cancer, talk with your health care provider about having an MRI and a mammogram every year.  Breast cancer (BRCA) gene test is recommended for women who have family members with BRCA-related cancers. Results of the assessment will determine the need for genetic counseling and BRCA1 and for BRCA2 testing. BRCA-related cancers include these types: ? Breast. This occurs in males or females. ? Ovarian. ? Tubal. This may also be called fallopian tube cancer. ? Cancer of the abdominal or pelvic lining (peritoneal cancer). ? Prostate. ? Pancreatic.  Cervical, Uterine, and Ovarian Cancer Your health care provider may recommend that you be screened regularly for cancer of the pelvic organs. These include your ovaries, uterus, and vagina. This screening involves a pelvic exam, which includes checking for microscopic changes to the surface of your cervix (Pap test).  For women ages 21-65, health care providers may recommend a pelvic exam and a Pap test every three years. For women ages 48-65, they may recommend the Pap test and pelvic exam, combined with testing for human papilloma virus (HPV), every five years. Some types of HPV increase your risk of cervical cancer. Testing for HPV may also be done on women of any age who have unclear Pap test results.  Other health care providers may not recommend any screening for nonpregnant women who are considered low risk for pelvic cancer and have no symptoms. Ask your health care provider if a screening pelvic exam is right for you.  If you have had past treatment for cervical cancer or a condition that could lead to cancer, you need Pap tests and screening for  cancer for at least 20 years after your treatment. If Pap tests have been discontinued for you, your risk factors (such as having a new sexual partner) need to be reassessed to determine if you should start having screenings again. Some women have medical problems that increase the chance of getting cervical cancer. In these cases, your health care provider may recommend that you have screening and Pap tests more often.  If you have a family history of uterine cancer or ovarian cancer, talk with your health care provider about genetic screening.  If you have vaginal bleeding after reaching menopause, tell your health care provider.  There are currently no reliable tests available to screen for ovarian cancer.  Lung Cancer Lung cancer screening is recommended for adults 4-24 years old who are at high risk for lung cancer because of a history of smoking. A yearly low-dose CT scan of the lungs is recommended if you:  Currently smoke.  Have a history of at least 30 pack-years of smoking and you currently smoke or have quit within the past 15 years. A pack-year is smoking an average of one pack of cigarettes per day for one year.  Yearly screening should:  Continue until it has been 15 years since you quit.  Stop if you develop a health problem that would prevent you from having  lung cancer treatment.  Colorectal Cancer  This type of cancer can be detected and can often be prevented.  Routine colorectal cancer screening usually begins at age 4 and continues through age 56.  If you have risk factors for colon cancer, your health care provider may recommend that you be screened at an earlier age.  If you have a family history of colorectal cancer, talk with your health care provider about genetic screening.  Your health care provider may also recommend using home test kits to check for hidden blood in your stool.  A small camera at the end of a tube can be used to examine your colon  directly (sigmoidoscopy or colonoscopy). This is done to check for the earliest forms of colorectal cancer.  Direct examination of the colon should be repeated every 5-10 years until age 30. However, if early forms of precancerous polyps or small growths are found or if you have a family history or genetic risk for colorectal cancer, you may need to be screened more often.  Skin Cancer  Check your skin from head to toe regularly.  Monitor any moles. Be sure to tell your health care provider: ? About any new moles or changes in moles, especially if there is a change in a mole's shape or color. ? If you have a mole that is larger than the size of a pencil eraser.  If any of your family members has a history of skin cancer, especially at a young age, talk with your health care provider about genetic screening.  Always use sunscreen. Apply sunscreen liberally and repeatedly throughout the day.  Whenever you are outside, protect yourself by wearing long sleeves, pants, a wide-brimmed hat, and sunglasses.  What should I know about osteoporosis? Osteoporosis is a condition in which bone destruction happens more quickly than new bone creation. After menopause, you may be at an increased risk for osteoporosis. To help prevent osteoporosis or the bone fractures that can happen because of osteoporosis, the following is recommended:  If you are 72-67 years old, get at least 1,000 mg of calcium and at least 600 mg of vitamin D per day.  If you are older than age 40 but younger than age 15, get at least 1,200 mg of calcium and at least 600 mg of vitamin D per day.  If you are older than age 83, get at least 1,200 mg of calcium and at least 800 mg of vitamin D per day.  Smoking and excessive alcohol intake increase the risk of osteoporosis. Eat foods that are rich in calcium and vitamin D, and do weight-bearing exercises several times each week as directed by your health care provider. What should I  know about how menopause affects my mental health? Depression may occur at any age, but it is more common as you become older. Common symptoms of depression include:  Low or sad mood.  Changes in sleep patterns.  Changes in appetite or eating patterns.  Feeling an overall lack of motivation or enjoyment of activities that you previously enjoyed.  Frequent crying spells.  Talk with your health care provider if you think that you are experiencing depression. What should I know about immunizations? It is important that you get and maintain your immunizations. These include:  Tetanus, diphtheria, and pertussis (Tdap) booster vaccine.  Influenza every year before the flu season begins.  Pneumonia vaccine.  Shingles vaccine.  Your health care provider may also recommend other immunizations. This information is not intended  to replace advice given to you by your health care provider. Make sure you discuss any questions you have with your health care provider. Document Released: 05/31/2005 Document Revised: 10/27/2015 Document Reviewed: 01/10/2015 Elsevier Interactive Patient Education  2018 [employer].

## 2017-10-07 NOTE — Assessment & Plan Note (Signed)
Encouraged slow, steady weight loss of 1-2lbs a week. Call with any concerns.  

## 2017-10-07 NOTE — Assessment & Plan Note (Deleted)
Stable. Continue current regimen. Continue to monitor. Call with any concerns. Refills given.  

## 2017-10-07 NOTE — Assessment & Plan Note (Signed)
Rechecking levels today. Await results. Call with any concerns.  

## 2017-10-07 NOTE — Assessment & Plan Note (Signed)
Lungs clear today, unclear why she is feeling so bad. Will get her into pulmonary for evaluation. Continue current regimen until then. 

## 2017-10-07 NOTE — Assessment & Plan Note (Signed)
Lungs clear today, unclear why she is feeling so bad. Will get her into pulmonary for evaluation. Continue current regimen until then.

## 2017-10-07 NOTE — Assessment & Plan Note (Signed)
Mirbetriq not covered. Rx for toviaz sent to her pharmacy. Call with any concerns.

## 2017-10-07 NOTE — Progress Notes (Signed)
BP 130/84 (BP Location: Left Arm, Patient Position: Sitting, Cuff Size: Large)   Pulse 82   Temp 98.6 F (37 C)   Ht 4' 10.1" (1.476 m)   Wt 204 lb 7 oz (92.7 kg)   SpO2 97%   BMI 42.58 kg/m    Subjective:    Patient ID: [name], female    DOB: 27-Jun-1955, 62 y.o.   MRN: [id number]  HPI: [name] is a 62 y.o. female presenting on 10/07/2017 for comprehensive medical examination. Current medical complaints include:  Continues to cough. Not feeling any better. Seems like she is doing worse. She notes that she feels like she has something stuck in her throat and in her chest. Chest hurting. Feeling SOB. Coughing a lot.   HYPERTENSION Hypertension status: controlled  Satisfied with current treatment? yes Duration of hypertension: chronic BP monitoring frequency:  not checking BP medication side effects:  no Medication compliance: excellent Previous BP meds: hctz Aspirin: no Recurrent headaches: no Visual changes: no Palpitations: no Dyspnea: yes Chest pain: yes Lower extremity edema: no Dizzy/lightheaded: no  DEPRESSION Mood status: controlled Satisfied with current treatment?: yes Symptom severity: mild  Duration of current treatment : chronic Side effects: yes Medication compliance: excellent compliance Psychotherapy/counseling: no  Previous psychiatric medications: wellbutrin Depressed mood: no Anxious mood: no Anhedonia: no Significant weight loss or gain: no Insomnia: no  Fatigue: no Feelings of worthlessness or guilt: no Impaired concentration/indecisiveness: no Suicidal ideations: no Hopelessness: no Crying spells: no Depression screen [facility] 2/9 10/07/2017 02/04/2017 08/28/2016 08/08/2016 05/02/2016  Decreased Interest 0 0 0 1 2  Down, Depressed, Hopeless 0 0 0 1 0  PHQ - 2 Score 0 0 0 2 2  Altered sleeping 0 0 0 1 -  Tired, decreased energy 0 0 1 1 -  Change in appetite 0 0 0 0 -  Feeling bad or failure about yourself  0 0 0 0 -    Trouble concentrating 0 0 0 0 -  Moving slowly or fidgety/restless 0 0 0 0 -  Suicidal thoughts 0 0 0 0 -  PHQ-9 Score 0 0 1 4 -  Difficult doing work/chores Not difficult at all Not difficult at all - - -   Menopausal Symptoms: no  Depression Screen done today and results listed below:  Depression screen [facility] 2/9 10/07/2017 02/04/2017 08/28/2016 08/08/2016 05/02/2016  Decreased Interest 0 0 0 1 2  Down, Depressed, Hopeless 0 0 0 1 0  PHQ - 2 Score 0 0 0 2 2  Altered sleeping 0 0 0 1 -  Tired, decreased energy 0 0 1 1 -  Change in appetite 0 0 0 0 -  Feeling bad or failure about yourself  0 0 0 0 -  Trouble concentrating 0 0 0 0 -  Moving slowly or fidgety/restless 0 0 0 0 -  Suicidal thoughts 0 0 0 0 -  PHQ-9 Score 0 0 1 4 -  Difficult doing work/chores Not difficult at all Not difficult at all - - -     Past Medical History:  Past Medical History:  Diagnosis Date  . Asthma   . Depression   . Diabetes mellitus without complication (HCC)   . Hyperlipidemia   . Kidney stones   . Migraine headache     Surgical History:  Past Surgical History:  Procedure Laterality Date  . ABDOMINAL HYSTERECTOMY    . BREAST CYST ASPIRATION Right 1981   benign  . BREAST CYST  ASPIRATION Right 1984   benign  . CHOLECYSTECTOMY    . COLONOSCOPY  March 2016  . OOPHORECTOMY      Medications:  Current Outpatient Medications on File Prior to Visit  Medication Sig  . cyclobenzaprine (FLEXERIL) 5 MG tablet TAKE ONE TABLET 3 TIMES DAILY AS NEEDED FOR MUSCLE SPASMS  . diphenoxylate-atropine (LOMOTIL) 2.5-0.025 MG tablet TAKE 1 TABLET BY MOUTH 4 TIMES DAILY AS NEEDED FOR DIARRHEA OR LOOSE STOOLS  . fluticasone (FLONASE) 50 MCG/ACT nasal spray Place 2 sprays into both nostrils 2 (two) times daily.   No current facility-administered medications on file prior to visit.     Allergies:  Allergies  Allergen Reactions  . Amoxicillin Hives    Social History:  Social History   Socioeconomic  History  . Marital status: Legally Separated    Spouse name: Not on file  . Number of children: Not on file  . Years of education: Not on file  . Highest education level: Not on file  Occupational History  . Not on file  Social Needs  . Financial resource strain: Not on file  . Food insecurity:    Worry: Not on file    Inability: Not on file  . Transportation needs:    Medical: Not on file    Non-medical: Not on file  Tobacco Use  . Smoking status: Never Smoker  . Smokeless tobacco: Never Used  Substance and Sexual Activity  . Alcohol use: Yes  . Drug use: No  . Sexual activity: Not on file  Lifestyle  . Physical activity:    Days per week: Not on file    Minutes per session: Not on file  . Stress: Not on file  Relationships  . Social connections:    Talks on phone: Not on file    Gets together: Not on file    Attends religious service: Not on file    Active member of club or organization: Not on file    Attends meetings of clubs or organizations: Not on file    Relationship status: Not on file  . Intimate partner violence:    Fear of current or ex partner: Not on file    Emotionally abused: Not on file    Physically abused: Not on file    Forced sexual activity: Not on file  Other Topics Concern  . Not on file  Social History Narrative  . Not on file   Social History   Tobacco Use  Smoking Status Never Smoker  Smokeless Tobacco Never Used   Social History   Substance and Sexual Activity  Alcohol Use Yes    Family History:  Family History  Problem Relation Age of Onset  . Hypertension Mother   . Cancer Mother        breast  . Arthritis Mother   . Alzheimer's disease Mother   . Parkinson's disease Mother   . Breast cancer Mother [id number]  . Hypertension Father   . Diabetes Father   . Kidney cancer Father   . Breast cancer Cousin 35  . Breast cancer Maternal Aunt   . Breast cancer Maternal Grandmother [id number]    Past medical history, surgical history,  medications, allergies, family history and social history reviewed with patient today and changes made to appropriate areas of the chart.   Review of Systems  Constitutional: Negative.   HENT: Positive for congestion. Negative for ear discharge, ear pain, hearing loss, nosebleeds, sinus pain, sore throat and tinnitus.  Respiratory: Positive for cough, shortness of breath and wheezing. Negative for hemoptysis, sputum production and stridor.   Cardiovascular: Positive for chest pain. Negative for palpitations, orthopnea, claudication, leg swelling and PND.  Gastrointestinal: Positive for abdominal pain. Negative for blood in stool, constipation, diarrhea, heartburn, melena, nausea and vomiting.  Genitourinary: Negative.   Musculoskeletal: Negative.   Skin: Negative.   Neurological: Negative.   Endo/Heme/Allergies: Positive for environmental allergies and polydipsia. Does not bruise/bleed easily.  Psychiatric/Behavioral: Negative.     All other ROS negative except what is listed above and in the HPI.      Objective:    BP 130/84 (BP Location: Left Arm, Patient Position: Sitting, Cuff Size: Large)   Pulse 82   Temp 98.6 F (37 C)   Ht 4' 10.1" (1.476 m)   Wt 204 lb 7 oz (92.7 kg)   SpO2 97%   BMI 42.58 kg/m   Wt Readings from Last 3 Encounters:  10/07/17 204 lb 7 oz (92.7 kg)  09/18/17 205 lb 6.4 oz (93.2 kg)  07/01/17 209 lb 8 oz (95 kg)    Physical Exam  Constitutional: She is oriented to person, place, and time. She appears well-developed and well-nourished. No distress.  HENT:  Head: Normocephalic and atraumatic.  Right Ear: Hearing and external ear normal.  Left Ear: Hearing and external ear normal.  Nose: Nose normal.  Mouth/Throat: Oropharynx is clear and moist. No oropharyngeal exudate.  Eyes: Pupils are equal, round, and reactive to light. Conjunctivae, EOM and lids are normal. Right eye exhibits no discharge. Left eye exhibits no discharge. No scleral icterus.    Neck: Normal range of motion. Neck supple. No JVD present. No tracheal deviation present. No thyromegaly present.  Cardiovascular: Normal rate, regular rhythm, normal heart sounds and intact distal pulses. Exam reveals no gallop and no friction rub.  No murmur heard. Pulmonary/Chest: Effort normal and breath sounds normal. No stridor. No respiratory distress. She has no wheezes. She has no rales. She exhibits no tenderness. Right breast exhibits no inverted nipple, no mass, no nipple discharge, no skin change and no tenderness. Left breast exhibits no inverted nipple, no mass, no nipple discharge, no skin change and no tenderness. No breast swelling, tenderness, discharge or bleeding. Breasts are symmetrical.  Abdominal: Soft. Bowel sounds are normal. She exhibits no distension and no mass. There is no tenderness. There is no rebound and no guarding. No hernia.  Genitourinary:  Genitourinary Comments: Pelvic exam deferred with shared decision making  Musculoskeletal: Normal range of motion. She exhibits no edema, tenderness or deformity.  Lymphadenopathy:    She has no cervical adenopathy.  Neurological: She is alert and oriented to person, place, and time. She displays normal reflexes. No cranial nerve deficit or sensory deficit. She exhibits normal muscle tone. Coordination normal.  Skin: Skin is warm, dry and intact. Capillary refill takes less than 2 seconds. No rash noted. She is not diaphoretic. No erythema. No pallor.  Psychiatric: She has a normal mood and affect. Her speech is normal and behavior is normal. Judgment and thought content normal. Cognition and memory are normal.  Nursing note and vitals reviewed.   Results for orders placed or performed in visit on 09/18/17  Bayer DCA Hb A1c Waived  Result Value Ref Range   HB A1C (BAYER DCA - WAIVED) 5.5 <7.0 %  Microalbumin, Urine Waived  Result Value Ref Range   Microalb, Ur Waived 10 0 - 19 mg/L   Creatinine, Urine Waived 100 10 -  300 mg/dL   Microalb/Creat Ratio <30 <30 mg/g      Assessment & Plan:   Problem List Items Addressed This Visit      Cardiovascular and Mediastinum   Essential hypertension    Under good control. Continue current regimen. Continue to monitor. Call with any concerns. Refills given       Relevant Medications   hydrochlorothiazide (HYDRODIURIL) 25 MG tablet   Other Relevant Orders   CBC with Differential/Platelet   Comprehensive metabolic panel   TSH     Respiratory   Asthma    Lungs clear today, unclear why she is feeling so bad. Will get her into pulmonary for evaluation. Continue current regimen until then.      Relevant Medications   Fluticasone-Umeclidin-Vilant (TRELEGY ELLIPTA) 100-62.5-25 MCG/INH AEPB   montelukast (SINGULAIR) 10 MG tablet   ipratropium-albuterol (DUONEB) 0.5-2.5 (3) MG/3ML SOLN   albuterol (PROAIR HFA) 108 (90 Base) MCG/ACT inhaler   Other Relevant Orders   CBC with Differential/Platelet   Comprehensive metabolic panel   TSH   Ambulatory referral to Pulmonology   Chronic bronchitis (HCC)    Lungs clear today, unclear why she is feeling so bad. Will get her into pulmonary for evaluation. Continue current regimen until then.      Relevant Orders   CBC with Differential/Platelet   Comprehensive metabolic panel   TSH   Ambulatory referral to Pulmonology     Digestive   Fatty infiltration of liver    Rechecking levels today. Await results. Call with any concerns.       Relevant Orders   CBC with Differential/Platelet   Comprehensive metabolic panel   TSH     Endocrine   Diabetes mellitus without complication (HCC)    Under good control. Continue current regimen. Continue to monitor. Call with any concerns. Refills given       Relevant Orders   CBC with Differential/Platelet   Comprehensive metabolic panel   Lipid Panel w/o Chol/HDL Ratio   TSH   Ambulatory referral to Ophthalmology     Other   Depression    Under good control.  Continue current regimen. Continue to monitor. Call with any concerns. Refills given       Relevant Medications   buPROPion (WELLBUTRIN SR) 150 MG 12 hr tablet   Other Relevant Orders   CBC with Differential/Platelet   Comprehensive metabolic panel   TSH   BMI 40.0-44.9, adult (HCC)    Encouraged slow, steady weight loss of 1-2lbs a week. Call with any concerns.       Relevant Orders   CBC with Differential/Platelet   Comprehensive metabolic panel   TSH   Mixed stress and urge urinary incontinence    Mirbetriq not covered. Rx for toviaz sent to her pharmacy. Call with any concerns.       Relevant Medications   fesoterodine (TOVIAZ) 4 MG TB24 tablet   Other Relevant Orders   CBC with Differential/Platelet   Comprehensive metabolic panel   TSH   UA/M w/rflx Culture, Routine    Other Visit Diagnoses    Routine general medical examination at a health care facility    -  Primary   Vaccines up to date. Screening labs checked today. Pap N/A. Colon and mammogram up to date. Continue diet and exercise. Call with any concerns.    Relevant Orders   CBC with Differential/Platelet   Comprehensive metabolic panel   TSH   COPD exacerbation (HCC)       Lungs  clear today, unclear why she is feeling so bad. Will get her into pulmonary for evaluation. Continue current regimen until then.   Relevant Medications   Fluticasone-Umeclidin-Vilant (TRELEGY ELLIPTA) 100-62.5-25 MCG/INH AEPB   montelukast (SINGULAIR) 10 MG tablet   ipratropium-albuterol (DUONEB) 0.5-2.5 (3) MG/3ML SOLN   albuterol (PROAIR HFA) 108 (90 Base) MCG/ACT inhaler   Other Relevant Orders   CBC with Differential/Platelet   Comprehensive metabolic panel   TSH   Ambulatory referral to Pulmonology       Follow up plan: Return in about 6 months (around 04/08/2018) for Follow up DM.   LABORATORY TESTING:  - Pap smear: not applicable  IMMUNIZATIONS:   - Tdap: Tetanus vaccination status reviewed: last tetanus booster  within 10 years. - Influenza: Postponed to flu season - Pneumovax: Up to date - Prevnar: Not applicable - HPV: Not applicable - Zostavax vaccine: Not applicable  SCREENING: -Mammogram: Up to date  - Colonoscopy: Up to date  - Bone Density: Not applicable   PATIENT COUNSELING:   Advised to take 1 mg of folate supplement per day if capable of pregnancy.   Sexuality: Discussed sexually transmitted diseases, partner selection, use of condoms, avoidance of unintended pregnancy  and contraceptive alternatives.   Advised to avoid cigarette smoking.  I discussed with the patient that most people either abstain from alcohol or drink within safe limits (<=14/week and <=4 drinks/occasion for males, <=7/weeks and <= 3 drinks/occasion for females) and that the risk for alcohol disorders and other health effects rises proportionally with the number of drinks per week and how often a drinker exceeds daily limits.  Discussed cessation/primary prevention of drug use and availability of treatment for abuse.   Diet: Encouraged to adjust caloric intake to maintain  or achieve ideal body weight, to reduce intake of dietary saturated fat and total fat, to limit sodium intake by avoiding high sodium foods and not adding table salt, and to maintain adequate dietary potassium and calcium preferably from fresh fruits, vegetables, and low-fat dairy products.    stressed the importance of regular exercise  Injury prevention: Discussed safety belts, safety helmets, smoke detector, smoking near bedding or upholstery.   Dental health: Discussed importance of regular tooth brushing, flossing, and dental visits.    NEXT PREVENTATIVE PHYSICAL DUE IN 1 YEAR. Return in about 6 months (around 04/08/2018) for Follow up DM.

## 2017-10-08 LAB — COMPREHENSIVE METABOLIC PANEL
ALT: 20 IU/L (ref 0–32)
AST: 16 IU/L (ref 0–40)
Albumin/Globulin Ratio: 2 (ref 1.2–2.2)
Albumin: 4.6 g/dL (ref 3.6–4.8)
Alkaline Phosphatase: 77 IU/L (ref 39–117)
BUN/Creatinine Ratio: 14 (ref 12–28)
BUN: 11 mg/dL (ref 8–27)
Bilirubin Total: 0.5 mg/dL (ref 0.0–1.2)
CO2: 26 mmol/L (ref 20–29)
CREATININE: 0.79 mg/dL (ref 0.57–1.00)
Calcium: 9.9 mg/dL (ref 8.7–10.3)
Chloride: 97 mmol/L (ref 96–106)
GFR calc Af Amer: 93 mL/min/{1.73_m2} (ref >59)
GFR calc non Af Amer: 80 mL/min/{1.73_m2} (ref >59)
GLUCOSE: 105 mg/dL — AB (ref 65–99)
Globulin, Total: 2.3 g/dL (ref 1.5–4.5)
Potassium: 3.8 mmol/L (ref 3.5–5.2)
Sodium: 140 mmol/L (ref 134–144)
Total Protein: 6.9 g/dL (ref 6.0–8.5)

## 2017-10-08 LAB — CBC WITH DIFFERENTIAL/PLATELET
BASOS ABS: 0 10*3/uL (ref 0.0–0.2)
Basos: 0 %
EOS (ABSOLUTE): 0.1 10*3/uL (ref 0.0–0.4)
EOS: 1 %
Hematocrit: 43.3 % (ref 34.0–46.6)
Hemoglobin: 13.8 g/dL (ref 11.1–15.9)
IMMATURE GRANULOCYTES: 0 %
Immature Grans (Abs): 0 10*3/uL (ref 0.0–0.1)
Lymphocytes Absolute: 2.2 10*3/uL (ref 0.7–3.1)
Lymphs: 22 %
MCH: 29.6 pg (ref 26.6–33.0)
MCHC: 31.9 g/dL (ref 31.5–35.7)
MCV: 93 fL (ref 79–97)
MONOS ABS: 0.8 10*3/uL (ref 0.1–0.9)
Monocytes: 8 %
NEUTROS PCT: 69 %
Neutrophils Absolute: 6.6 10*3/uL (ref 1.4–7.0)
PLATELETS: 292 10*3/uL (ref 150–450)
RBC: 4.67 x10E6/uL (ref 3.77–5.28)
RDW: 13.5 % (ref 12.3–15.4)
WBC: 9.7 10*3/uL (ref 3.4–10.8)

## 2017-10-08 LAB — LIPID PANEL W/O CHOL/HDL RATIO
CHOLESTEROL TOTAL: 211 mg/dL — AB (ref 100–199)
HDL: 80 mg/dL (ref >39)
LDL CALC: 105 mg/dL — AB (ref 0–99)
TRIGLYCERIDES: 130 mg/dL (ref 0–149)
VLDL Cholesterol Cal: 26 mg/dL (ref 5–40)

## 2017-10-08 LAB — TSH: TSH: 1.58 u[IU]/mL (ref 0.450–4.500)

## 2017-10-14 NOTE — Progress Notes (Signed)
[facility]      Assessment and Plan:  Chronic cough. -Patient has a chronic cough that is been present for about 6 months, of uncertain etiology. - We will start empiric therapy, omeprazole daily, as well as Allegra-D.  She is asked to use these for 1 month and call us back if not improved, at that time we will consider getting a CAT scan and/or bronchoscopy.  Allergic rhinitis. - Continue Flonase.  Asthma-persistent. -Patient has persistent asthma symptoms, currently on trilogy, albuterol, Singulair. -Continue current therapy, if cough not improved at next visit will request allergy testing results from allergist office.  Orders Placed This Encounter  Procedures  . IgE   Meds ordered this encounter  Medications  . omeprazole (PRILOSEC) 40 MG capsule    Sig: Take 1 capsule (40 mg total) by mouth daily.    Dispense:  30 capsule    Refill:  2   Return if symptoms worsen or fail to improve, for Call back in 1 month if not better and we will send you for a CT chest. .   Date: 10/16/2017  MRN# [id number] [name] 12/29/1955  [name] is a 62 y.o. old female seen in consultation for chief complaint of:    Chief Complaint  Patient presents with  . Consult    Referred by M.Johnson for COPD eval  . Cough  . Wheezing  . Shortness of Breath    with and without exertion  . Chest Pain    with activity    HPI:   The patient is a 62 year old female, previously seen by Dr. Meredeth IdeFleming for asthma, cough. She has a cough that started about 6 months ago, she has been on courses of abx and steroids which make it better.   Tussionex helps at night. She has tried on different inhalers which have helped a bit. The cough came on as the flu, the cough never went away.  She had a similar bout on 2017, she had a CT chest at that time, it lasted for several months, she does not recall how it went away.  Humidity makes the cough worse and makes  it harder to breathe. She is currently on Trelegy once daily and not sure that it helps. She takes singulair. She uses her rescue inhaler daily.  She had allergy testing and was allergic to trees and mold.  She denies refllux, and has not been tried empirically. She denies sinus drainage she takes flonase. No pets.  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  Never smoker, no second hand exposures.   **Absolute eosinophil count 10/07/2017>> 200 **Imaging including CT chest 12/29/2015, chest x-ray 07/01/2017>> bibasilar groundglass changes, reduced lung volumes due to obesity. **IgE 01/16/16 @[facility] ; 31   PMHX:   Past Medical History:  Diagnosis Date  . Asthma   . Depression   . Diabetes mellitus without complication (HCC)   . Hyperlipidemia   . Kidney stones   . Migraine headache    Surgical Hx:  Past Surgical History:  Procedure Laterality Date  . ABDOMINAL HYSTERECTOMY    . BREAST CYST ASPIRATION Right 1981   benign  . BREAST CYST ASPIRATION Right 1984   benign  . CHOLECYSTECTOMY    . COLONOSCOPY  March 2016  . OOPHORECTOMY     Family Hx:  Family History  Problem Relation Age of Onset  . Hypertension Mother   . Cancer Mother  breast  . Arthritis Mother   . Alzheimer's disease Mother   . Parkinson's disease Mother   . Breast cancer Mother 81  . Hypertension Father   . Diabetes Father   . Kidney cancer Father   . Breast cancer Cousin 35  . Breast cancer Maternal Aunt   . Breast cancer Maternal Grandmother 18   Social Hx:   Social History   Tobacco Use  . Smoking status: Never Smoker  . Smokeless tobacco: Never Used  Substance Use Topics  . Alcohol use: Yes  . Drug use: No   Medication:    Current Outpatient Medications:  .  albuterol (PROAIR HFA) 108 (90 Base) MCG/ACT inhaler, Inhale 2 puffs into the lungs every 6 (six) hours as needed for wheezing or shortness of breath., Disp: 8.5 g, Rfl: 11 .  buPROPion  (WELLBUTRIN SR) 150 MG 12 hr tablet, Take 1 tablet (150 mg total) by mouth 3 (three) times daily., Disp: 270 tablet, Rfl: 1 .  cyclobenzaprine (FLEXERIL) 5 MG tablet, TAKE ONE TABLET 3 TIMES DAILY AS NEEDED FOR MUSCLE SPASMS, Disp: 30 tablet, Rfl: 1 .  diphenoxylate-atropine (LOMOTIL) 2.5-0.025 MG tablet, TAKE 1 TABLET BY MOUTH 4 TIMES DAILY AS NEEDED FOR DIARRHEA OR LOOSE STOOLS, Disp: 30 tablet, Rfl: 1 .  fesoterodine (TOVIAZ) 4 MG TB24 tablet, Take 1 tablet (4 mg total) by mouth daily., Disp: 90 tablet, Rfl: 1 .  fluticasone (FLONASE) 50 MCG/ACT nasal spray, Place 2 sprays into both nostrils 2 (two) times daily., Disp: , Rfl:  .  Fluticasone-Umeclidin-Vilant (TRELEGY ELLIPTA) 100-62.5-25 MCG/INH AEPB, Inhale 1 puff into the lungs daily., Disp: 90 each, Rfl: 3 .  hydrochlorothiazide (HYDRODIURIL) 25 MG tablet, Take 1 tablet (25 mg total) by mouth daily., Disp: 90 tablet, Rfl: 1 .  hyoscyamine (LEVSIN SL) 0.125 MG SL tablet, TAKE ONE TABLET BY MOUTH EVERY 4 HOURS AS NEEDED, Disp: 540 tablet, Rfl: 1 .  ipratropium-albuterol (DUONEB) 0.5-2.5 (3) MG/3ML SOLN, Take 3 mLs by nebulization every 6 (six) hours as needed., Disp: 360 mL, Rfl: 3 .  montelukast (SINGULAIR) 10 MG tablet, Take 1 tablet (10 mg total) by mouth at bedtime., Disp: 90 tablet, Rfl: 3   Allergies:  Amoxicillin  Review of Systems: Gen:  Denies  fever, sweats, chills HEENT: Denies blurred vision, double vision. bleeds, sore throat Cvc:  No dizziness, chest pain. Resp:   Denies cough or sputum production, shortness of breath Gi: Denies swallowing difficulty, stomach pain. Gu:  Denies bladder incontinence, burning urine Ext:   No Joint pain, stiffness. Skin: No skin rash,  hives  Endoc:  No polyuria, polydipsia. Psych: No depression, insomnia. Other:  All other systems were reviewed with the patient and were negative other that what is mentioned in the HPI.   Physical Examination:   VS: BP 128/80 (BP Location: Left Arm, Cuff  Size: Large)   Pulse 87   Resp 16   Ht 4\' 10"  (1.473 m)   Wt 203 lb (92.1 kg)   SpO2 95%   BMI 42.43 kg/m   General Appearance: No distress  Neuro:without focal findings,  speech normal,  HEENT: PERRLA, EOM intact.   Pulmonary: normal breath sounds, No wheezing.  CardiovascularNormal S1,S2.  No m/r/g.   Abdomen: Benign, Soft, non-tender. Renal:  No costovertebral tenderness  GU:  No performed at this time. Endoc: No evident thyromegaly, no signs of acromegaly. Skin:   warm, no rashes, no ecchymosis  Extremities: normal, no cyanosis, clubbing.  Other findings:  LABORATORY PANEL:   CBC No results for input(s): WBC, HGB, HCT, PLT in the last 168 hours. ------------------------------------------------------------------------------------------------------------------  Chemistries  No results for input(s): NA, K, CL, CO2, GLUCOSE, BUN, CREATININE, CALCIUM, MG, AST, ALT, ALKPHOS, BILITOT in the last 168 hours.  Invalid input(s): GFRCGP ------------------------------------------------------------------------------------------------------------------  Cardiac Enzymes No results for input(s): TROPONINI in the last 168 hours. ------------------------------------------------------------  RADIOLOGY:  No results found.     Thank  you for the consultation and for allowing [facility] to assist in the care of your patient. Our recommendations are noted above.  Please contact us if we can be of further service.   Wells Guiles, M.D., F.C.C.P.  Board Certified in Internal Medicine, Pulmonary Medicine, Critical Care Medicine, and Sleep Medicine.  [facility] Office Number: [phone]   10/16/2017

## 2017-10-16 ENCOUNTER — Ambulatory Visit: Payer: 59 | Admitting: Internal Medicine

## 2017-10-16 ENCOUNTER — Encounter: Payer: Self-pay | Admitting: Internal Medicine

## 2017-10-16 ENCOUNTER — Other Ambulatory Visit
Admission: RE | Admit: 2017-10-16 | Discharge: 2017-10-16 | Disposition: A | Discharge status: Home / Self Care | Payer: 59 | Source: Ambulatory Visit | Attending: Internal Medicine | Admitting: Internal Medicine

## 2017-10-16 ENCOUNTER — Ambulatory Visit: Payer: 59 | Admitting: Family Medicine

## 2017-10-16 VITALS — BP 128/80 | HR 87 | Resp 16 | Ht <= 58 in | Wt 203.0 lb

## 2017-10-16 DIAGNOSIS — J4540 Moderate persistent asthma, uncomplicated: Secondary | ICD-10-CM | POA: Insufficient documentation

## 2017-10-16 MED ORDER — OMEPRAZOLE 40 MG PO CPDR: 40.0000 mg | DELAYED_RELEASE_CAPSULE | Freq: Every day | ORAL | 2 refills | Status: DC

## 2017-10-16 NOTE — Patient Instructions (Addendum)
Start prilosec 40 mg daily.  Will check blood work, today.  Start allegra-D daily for one month.   Call back in 1 month if not better and we will send you for a CT chest.

## 2017-10-20 LAB — IGE: IGE (IMMUNOGLOBULIN E), SERUM: 23 [IU]/mL (ref 6–495)

## 2017-10-30 ENCOUNTER — Other Ambulatory Visit: Payer: Self-pay | Admitting: Family Medicine

## 2017-10-30 NOTE — Telephone Encounter (Signed)
Flexeril refill Last Refill:08/19/17 # 30 with 1 refill Last OV: 09/18/17 PCP: Dr. Dossie Arbourrissman

## 2017-11-14 ENCOUNTER — Telehealth: Payer: Self-pay

## 2017-11-14 NOTE — Telephone Encounter (Signed)
PA started with [employer] via phone, for hyoscyamine 0.0125mg 

## 2017-11-19 ENCOUNTER — Encounter: Payer: Self-pay | Admitting: Family Medicine

## 2017-11-19 ENCOUNTER — Telehealth: Payer: Self-pay | Admitting: Internal Medicine

## 2017-11-19 DIAGNOSIS — J189 Pneumonia, unspecified organism: Secondary | ICD-10-CM

## 2017-11-19 NOTE — Telephone Encounter (Signed)
Call back in 1 month if not better and we will send you for a CT chest.          CT chest needs to be ordered.

## 2017-11-19 NOTE — Telephone Encounter (Signed)
Pt calling to schedule CT  She states she's not doing any better would like to schedule this  Please call back

## 2017-11-20 ENCOUNTER — Encounter: Payer: Self-pay | Admitting: Family Medicine

## 2017-11-20 NOTE — Telephone Encounter (Signed)
Spoke with pt and she stated that she only wanted something for her nerves. She stated that she would call back later for an appt.

## 2017-12-01 ENCOUNTER — Other Ambulatory Visit: Payer: Self-pay | Admitting: Internal Medicine

## 2017-12-01 NOTE — Telephone Encounter (Signed)
Ordered

## 2017-12-01 NOTE — Telephone Encounter (Signed)
Pls send pt for non-contrast CT chest re: non-resolving pneumonia. Follow up after for results.

## 2017-12-02 NOTE — Telephone Encounter (Signed)
Patient calling to check on status Is ready to schedule Please call

## 2017-12-03 NOTE — Telephone Encounter (Signed)
CT [facility] F/U appointment scheduled with Dr. Ardyth Manam for 12/10/17. Pt is aware of both appointments. Nothing else needed at this time. Rhonda J Cobb

## 2017-12-09 ENCOUNTER — Ambulatory Visit
Admission: RE | Admit: 2017-12-09 | Discharge: 2017-12-09 | Disposition: A | Discharge status: Home / Self Care | Payer: 59 | Source: Ambulatory Visit | Attending: Internal Medicine | Admitting: Internal Medicine

## 2017-12-09 DIAGNOSIS — R918 Other nonspecific abnormal finding of lung field: Secondary | ICD-10-CM | POA: Diagnosis not present

## 2017-12-09 DIAGNOSIS — K760 Fatty (change of) liver, not elsewhere classified: Secondary | ICD-10-CM | POA: Insufficient documentation

## 2017-12-09 DIAGNOSIS — I700 Atherosclerosis of aorta: Secondary | ICD-10-CM | POA: Insufficient documentation

## 2017-12-09 DIAGNOSIS — Z9049 Acquired absence of other specified parts of digestive tract: Secondary | ICD-10-CM | POA: Diagnosis not present

## 2017-12-09 DIAGNOSIS — J189 Pneumonia, unspecified organism: Secondary | ICD-10-CM | POA: Insufficient documentation

## 2017-12-09 NOTE — Progress Notes (Signed)
[facility]      Assessment and Plan:  Chronic cough. -Patient has a chronic cough which is been present for nearly a year, and is not responded to usual empiric therapies. - We discussed proceeding with bronchoscopy, and the likelihood that it would be unrevealing.  Will order serology, alpha-1 level, pulmonary function testing. - She is responded mildly to Tessalon 100 mg 3 times daily in the past, will start her on 200 mg 3 times daily in addition to Mucinex-DM 3 times daily. - We will have her follow-up in approximate 6 weeks time at that time if all testing and empiric therapies are negative we will again consider bronchoscopy.  Emphysematous blebs. - Small cystic changes of both lungs which I suspect are emphysematous blebs.  There do not appear to be larger to cause significant dyspnea or symptoms.  The patient is a lifelong non-smoker - We will check a pulmonary function test, alpha-1.  Allergic rhinitis. - Continue Flonase.  Asthma-persistent. -Patient has persistent asthma symptoms, currently on trilogy, albuterol, Singulair. -Continue current therapy, if cough not improved at next visit will request allergy testing results from allergist office.  Orders Placed This Encounter  Procedures  . Angiotensin converting enzyme  . ANCA screen with reflex titer  . Alpha-1-antitrypsin  . Alpha-1 antitrypsin phenotype  . Pulmonary Function Test [facility] Only   Meds ordered this encounter  Medications  . benzonatate (TESSALON PERLES) 100 MG capsule    Sig: Take 2 capsules (200 mg total) by mouth 3 (three) times daily.    Dispense:  90 capsule    Refill:  2   Return in about 6 weeks (around 01/21/2018).   Date: 12/09/2017  MRN# [id number] Erie NoeCynthia J Zelada Apr 12, 1956  Erie NoeCynthia J Gladman is a 62 y.o. old female seen in consultation for chief complaint of:    No chief complaint on file.   HPI:   The patient is a 62 year old female, previously  seen by Dr. Meredeth IdeFleming for asthma, cough. She has a cough that started about 6 months ago, she has been on courses of abx and steroids which make it better.  At last visit she was tried on empiric therapies and if not improved she would be considered for CT.  She did not notice a significant change, therefore she was sent for CT of the chest.  CT chest showed multiple small emphysematous blebs. She tried empiric therapies which was not helpful.     **CT chest 12/09/2017>> reduced lung volumes secondary to obesity, multiple bilateral small emphysematous blebs, evidence of air-trapping. **Absolute eosinophil count 10/07/2017>> 200 **Imaging including CT chest 12/29/2015, chest x-ray 07/01/2017>> bibasilar groundglass changes, reduced lung volumes due to obesity. **IgE 01/16/16 @[facility] ; 31  Medication:    Current Outpatient Medications:  .  albuterol (PROAIR HFA) 108 (90 Base) MCG/ACT inhaler, Inhale 2 puffs into the lungs every 6 (six) hours as needed for wheezing or shortness of breath., Disp: 8.5 g, Rfl: 11 .  buPROPion (WELLBUTRIN SR) 150 MG 12 hr tablet, Take 1 tablet (150 mg total) by mouth 3 (three) times daily., Disp: 270 tablet, Rfl: 1 .  cyclobenzaprine (FLEXERIL) 5 MG tablet, TAKE ONE TABLET BY MOUTH 3 TIMES DAILY AS NEEDED FOR MUSCLE SPASM, Disp: 30 tablet, Rfl: 1 .  diphenoxylate-atropine (LOMOTIL) 2.5-0.025 MG tablet, TAKE 1 TABLET BY MOUTH 4 TIMES DAILY AS NEEDED FOR DIARRHEA OR LOOSE STOOLS, Disp: 30 tablet, Rfl: 1 .  fesoterodine (TOVIAZ) 4 MG TB24 tablet, Take 1 tablet (4  mg total) by mouth daily., Disp: 90 tablet, Rfl: 1 .  fluticasone (FLONASE) 50 MCG/ACT nasal spray, Place 2 sprays into both nostrils 2 (two) times daily., Disp: , Rfl:  .  Fluticasone-Umeclidin-Vilant (TRELEGY ELLIPTA) 100-62.5-25 MCG/INH AEPB, Inhale 1 puff into the lungs daily., Disp: 90 each, Rfl: 3 .  hydrochlorothiazide (HYDRODIURIL) 25 MG tablet, Take 1 tablet (25 mg total) by mouth daily., Disp: 90 tablet, Rfl: 1 .   hyoscyamine (LEVSIN SL) 0.125 MG SL tablet, TAKE ONE TABLET BY MOUTH EVERY 4 HOURS AS NEEDED, Disp: 540 tablet, Rfl: 1 .  ipratropium-albuterol (DUONEB) 0.5-2.5 (3) MG/3ML SOLN, Take 3 mLs by nebulization every 6 (six) hours as needed., Disp: 360 mL, Rfl: 3 .  montelukast (SINGULAIR) 10 MG tablet, Take 1 tablet (10 mg total) by mouth at bedtime., Disp: 90 tablet, Rfl: 3 .  omeprazole (PRILOSEC) 40 MG capsule, TAKE 1 CAPSULE BY MOUTH EVERY DAY, Disp: 30 capsule, Rfl: 2   Allergies:  Amoxicillin  Review of Systems:  Constitutional: Feels well. Cardiovascular: No chest pain.  Pulmonary: Denies dyspnea.   The remainder of systems were reviewed and were found to be negative other than what is documented in the HPI.    Physical Examination:   VS: BP 116/72 (BP Location: Left Arm)   Pulse 83   Resp 16   Ht 4\' 10"  (1.473 m)   Wt 203 lb (92.1 kg)   SpO2 97%   BMI 42.43 kg/m   General Appearance: No distress  Neuro:without focal findings, mental status, speech normal, alert and oriented HEENT: PERRLA, EOM intact Pulmonary: No wheezing, No rales  CardiovascularNormal S1,S2.  No m/r/g.  Abdomen: Benign, Soft, non-tender, No masses Renal:  No costovertebral tenderness  GU:  No performed at this time. Endoc: No evident thyromegaly, no signs of acromegaly or Cushing features Skin:   warm, no rashes, no ecchymosis  Extremities: normal, no cyanosis, clubbing.     LABORATORY PANEL:   CBC No results for input(s): WBC, HGB, HCT, PLT in the last 168 hours. ------------------------------------------------------------------------------------------------------------------  Chemistries  No results for input(s): NA, K, CL, CO2, GLUCOSE, BUN, CREATININE, CALCIUM, MG, AST, ALT, ALKPHOS, BILITOT in the last 168 hours.  Invalid input(s): GFRCGP ------------------------------------------------------------------------------------------------------------------  Cardiac Enzymes No results for  input(s): TROPONINI in the last 168 hours. ------------------------------------------------------------  RADIOLOGY:  Ct Chest Wo Contrast  Result Date: 12/09/2017 CLINICAL DATA:  Shortness of breath and persistent cough EXAM: CT CHEST WITHOUT CONTRAST TECHNIQUE: Multidetector CT imaging of the chest was performed following the standard protocol without IV contrast. COMPARISON:  Chest CT December 29, 2015 and chest radiograph July 01, 2017 FINDINGS: Cardiovascular: There is no demonstrable thoracic aortic aneurysm. Visualized great vessels appear normal on this noncontrast enhanced study. There is mild aortic atherosclerosis. There is no pericardial effusion or pericardial thickening. Mediastinum/Nodes: Thyroid appears unremarkable. No adenopathy is evident by size criteria. There are scattered subcentimeter mediastinal lymph nodes. No esophageal lesions are evident. Lungs/Pleura: There is scarring with associated chronic atelectasis in the inferior lingula, stable. There are scattered small bullae in the lower lobes. There is no evident edema or consolidation. There is no appreciable pleural thickening or pleural effusion. Multiple areas of ground-glass type opacity noted on the 2017 study are not appreciable currently. Upper Abdomen: Gallbladder is absent. There is hepatic steatosis. Visualized upper abdominal structures otherwise appear unremarkable. Musculoskeletal: There are foci of degenerative change in the thoracic spine. No evident blastic or lytic bone lesions. IMPRESSION: 1. Scarring with probable chronic atelectasis inferior  lingula. No edema or consolidation. Foci of ground-glass type opacities noted on prior CT are not present currently. 2.  No demonstrable thoracic adenopathy. 3.   Mild aortic atherosclerosis.  No evident aneurysm. 4.  Hepatic steatosis. 5.  Gallbladder absent. Aortic Atherosclerosis (ICD10-I70.0). Electronically Signed   By: William  Woodruff III M.D.   On: 12/09/2017 08:09        ThBretta Bangank  you for the consultation and for allowing [facility] to assist in the care of your patient. Our recommendations are noted above.  Please contact us if we can be of further service.   Wells Guileseep Dirk Vanaman, M.D., F.C.C.P.  Board Certified in Internal Medicine, Pulmonary Medicine, Critical Care Medicine, and Sleep Medicine.  [facility] Office Number: [phone]   12/09/2017

## 2017-12-10 ENCOUNTER — Encounter: Payer: Self-pay | Admitting: Internal Medicine

## 2017-12-10 ENCOUNTER — Ambulatory Visit: Payer: 59 | Admitting: Internal Medicine

## 2017-12-10 ENCOUNTER — Other Ambulatory Visit
Admission: RE | Admit: 2017-12-10 | Discharge: 2017-12-10 | Disposition: A | Discharge status: Home / Self Care | Payer: 59 | Source: Ambulatory Visit | Attending: Internal Medicine | Admitting: Internal Medicine

## 2017-12-10 VITALS — BP 116/72 | HR 83 | Resp 16 | Ht <= 58 in | Wt 203.0 lb

## 2017-12-10 DIAGNOSIS — J42 Unspecified chronic bronchitis: Secondary | ICD-10-CM | POA: Insufficient documentation

## 2017-12-10 DIAGNOSIS — J449 Chronic obstructive pulmonary disease, unspecified: Secondary | ICD-10-CM | POA: Diagnosis present

## 2017-12-10 DIAGNOSIS — R05 Cough: Secondary | ICD-10-CM

## 2017-12-10 DIAGNOSIS — R059 Cough, unspecified: Secondary | ICD-10-CM

## 2017-12-10 MED ORDER — BENZONATATE 100 MG PO CAPS: 200.0000 mg | ORAL_CAPSULE | Freq: Three times a day (TID) | ORAL | 2 refills | Status: DC

## 2017-12-10 NOTE — Patient Instructions (Addendum)
Can stop allegra-D.  Will send for blood work.  Will send for Lung function test.   Start tessalon 200 mg three times daily.  Start mucinex-Dm three times daily.

## 2017-12-11 LAB — ANCA TITERS
C-ANCA: <1:20 {titer}
P-ANCA: <1:20 {titer}

## 2017-12-11 LAB — ANGIOTENSIN CONVERTING ENZYME: Angiotensin-Converting Enzyme: 31 U/L (ref 14–82)

## 2017-12-12 ENCOUNTER — Telehealth: Payer: Self-pay | Admitting: Internal Medicine

## 2017-12-12 LAB — ALPHA-1 ANTITRYPSIN PHENOTYPE: A-1 Antitrypsin, Ser: 150 mg/dL (ref 90–200)

## 2017-12-12 NOTE — Telephone Encounter (Signed)
Please advise on message below.

## 2017-12-12 NOTE — Telephone Encounter (Signed)
Patient calling asking about lab results   Please call back

## 2017-12-15 NOTE — Telephone Encounter (Signed)
Thus far all testing negative.

## 2017-12-16 NOTE — Telephone Encounter (Signed)
Pt informed. Nothing further needed. 

## 2017-12-26 ENCOUNTER — Telehealth: Payer: Self-pay | Admitting: Internal Medicine

## 2017-12-26 NOTE — Telephone Encounter (Signed)
Medical records faxed a coding query that required reason or symptoms for testing  not dx for testing .  This form was signed but question not answered .  Please complete and return form to medical records. [phone] fax.

## 2017-12-30 NOTE — Telephone Encounter (Signed)
DX placed on forms and faxed back to number given.

## 2018-01-15 ENCOUNTER — Ambulatory Visit: Payer: 59 | Attending: Internal Medicine

## 2018-01-15 DIAGNOSIS — J42 Unspecified chronic bronchitis: Secondary | ICD-10-CM

## 2018-01-15 DIAGNOSIS — R05 Cough: Secondary | ICD-10-CM

## 2018-01-15 DIAGNOSIS — R059 Cough, unspecified: Secondary | ICD-10-CM

## 2018-01-15 MED ORDER — ALBUTEROL SULFATE (2.5 MG/3ML) 0.083% IN NEBU
2.5000 mg | INHALATION_SOLUTION | Freq: Once | RESPIRATORY_TRACT | Status: AC
  Administered 2018-01-15: 2.5 mg via RESPIRATORY_TRACT
  Filled 2018-01-15: qty 3

## 2018-01-18 ENCOUNTER — Encounter: Payer: Self-pay | Admitting: Emergency Medicine

## 2018-01-18 ENCOUNTER — Other Ambulatory Visit: Payer: Self-pay

## 2018-01-18 DIAGNOSIS — Z79899 Other long term (current) drug therapy: Secondary | ICD-10-CM | POA: Insufficient documentation

## 2018-01-18 DIAGNOSIS — I10 Essential (primary) hypertension: Secondary | ICD-10-CM | POA: Insufficient documentation

## 2018-01-18 DIAGNOSIS — E119 Type 2 diabetes mellitus without complications: Secondary | ICD-10-CM | POA: Insufficient documentation

## 2018-01-18 DIAGNOSIS — J45909 Unspecified asthma, uncomplicated: Secondary | ICD-10-CM | POA: Diagnosis not present

## 2018-01-18 DIAGNOSIS — R0600 Dyspnea, unspecified: Secondary | ICD-10-CM | POA: Diagnosis present

## 2018-01-18 DIAGNOSIS — Z9049 Acquired absence of other specified parts of digestive tract: Secondary | ICD-10-CM | POA: Insufficient documentation

## 2018-01-18 DIAGNOSIS — F329 Major depressive disorder, single episode, unspecified: Secondary | ICD-10-CM | POA: Diagnosis not present

## 2018-01-18 DIAGNOSIS — J069 Acute upper respiratory infection, unspecified: Secondary | ICD-10-CM | POA: Diagnosis not present

## 2018-01-18 LAB — CBC
HCT: 39 % (ref 35.0–47.0)
HEMOGLOBIN: 13.2 g/dL (ref 12.0–16.0)
MCH: 31.1 pg (ref 26.0–34.0)
MCHC: 33.7 g/dL (ref 32.0–36.0)
MCV: 92.2 fL (ref 80.0–100.0)
PLATELETS: 250 10*3/uL (ref 150–440)
RBC: 4.23 MIL/uL (ref 3.80–5.20)
RDW: 13.3 % (ref 11.5–14.5)
WBC: 6.9 10*3/uL (ref 3.6–11.0)

## 2018-01-18 LAB — BASIC METABOLIC PANEL
Anion gap: 9 (ref 5–15)
BUN: 15 mg/dL (ref 8–23)
CALCIUM: 9.1 mg/dL (ref 8.9–10.3)
CHLORIDE: 106 mmol/L (ref 98–111)
CO2: 26 mmol/L (ref 22–32)
CREATININE: 0.78 mg/dL (ref 0.44–1.00)
GFR calc Af Amer: >60 mL/min (ref >60)
GFR calc non Af Amer: >60 mL/min (ref >60)
Glucose, Bld: 139 mg/dL — ABNORMAL HIGH (ref 70–99)
Potassium: 3.3 mmol/L — ABNORMAL LOW (ref 3.5–5.1)
SODIUM: 141 mmol/L (ref 135–145)

## 2018-01-18 MED ORDER — ALBUTEROL SULFATE (2.5 MG/3ML) 0.083% IN NEBU: 5.0000 mg | INHALATION_SOLUTION | Freq: Once | RESPIRATORY_TRACT | Status: DC

## 2018-01-18 NOTE — ED Triage Notes (Signed)
Pt arrives ambulatory with steady gait to triage with c/o [facility] due to COPD. Pt's lung sounds are clear by auscultation at this time in triage and pt is in NAD.

## 2018-01-19 ENCOUNTER — Emergency Department: Payer: 59

## 2018-01-19 ENCOUNTER — Emergency Department
Admission: EM | Admit: 2018-01-19 | Discharge: 2018-01-19 | Disposition: A | Discharge status: Home / Self Care | Payer: 59 | Attending: Emergency Medicine | Admitting: Emergency Medicine

## 2018-01-19 DIAGNOSIS — R0600 Dyspnea, unspecified: Secondary | ICD-10-CM

## 2018-01-19 MED ORDER — PREDNISONE 20 MG PO TABS
60.0000 mg | ORAL_TABLET | Freq: Once | ORAL | Status: AC
  Administered 2018-01-19: 60 mg via ORAL
  Filled 2018-01-19: qty 3

## 2018-01-19 MED ORDER — PREDNISONE 20 MG PO TABS: 40.0000 mg | ORAL_TABLET | Freq: Every day | ORAL | 0 refills | Status: DC

## 2018-01-19 MED ORDER — GUAIFENESIN-CODEINE 100-10 MG/5ML PO SOLN: 5.0000 mL | Freq: Four times a day (QID) | ORAL | 0 refills | Status: DC | PRN

## 2018-01-19 MED ORDER — AZITHROMYCIN 250 MG PO TABS: ORAL_TABLET | ORAL | 0 refills | Status: AC

## 2018-01-19 NOTE — Progress Notes (Addendum)
[facility] Consultation      Assessment and Plan:  Acute asthma exacerbation. - Went to ED, currently on prednisone and antibiotics.  Will continue. -Continue trilogy.  Patient is currently taking Symbicort in addition to this, I asked her to stop that.  Chronic cough. -Chronic cough is been present for approximately a year, etiology uncertain.  She is not responded to usual empiric therapies. - Discussed with patient that as a final step in this process we would perform a bronchoscopy to rule out endobronchial lesions or atypical infections.  Patient would like to proceed. -Patient has a chronic cough which is been present for nearly a year, and is not responded to usual empiric therapies. - ACE, ANCA, alpha-1 negative.  Will send for immunoglobulin testing. - She is currently taking Tessalon 100 mg as needed, asked her to increase to 200 mg 3 times daily.  Emphysematous blebs. - Small cystic changes of both lungs which I suspect are emphysematous blebs.  Patient is a lifelong smoker, these legs do not appear large enough to call significant symptoms. - Alpha-1 normal, PFT does not show evidence of emphysema.  Allergic rhinitis. - Continue Flonase.   Orders Placed This Encounter  Procedures  . IgG, IgA, IgM    Return in about 1 month (around 02/20/2018) for 1 month after bronchoscopy. .   Date: 01/19/2018  MRN# [id number] [name] 12/31/1955  [name] is a 62 y.o. old female seen in consultation for chief complaint of:    Chief Complaint  Patient presents with  . Asthma    Pt was seen in ED 9/30 for cough and sob:    HPI:   The patient is a 62-year-old female, last visit she was noted to have a chronic cough for approximately 1 year.  She had had a CT of the chest in the past which showed multiple small emphysematous blebs.  She was sent for serology cotesting including ACE, ANCA, alpha-1, IgE which were all negative.  Pulmonary  function testing was also ordered.  She was asked to use Tessalon 200 mg, Mucinex DM both 3 times daily.  Since her last visit she feels that she has been feeling "pretty good" she has been taking prilosec. However the cough is still present and essentially unchanged.  She takes Trelegy twice daily, symbicort once daily, and maxair about every day, feels that it helps short term. She is taking tessalon 100 mg once in a while.  She had a flare up recently with a lot of coughing and went to the ED. She received prednisone, abx, and is feeling better today.    She was previously seen by Dr. Fleming for asthma, cough.   **Pulmonary function test 01/15/2018>> tracings personally reviewed.  FVC 72% predicted, there is no significant improvement bronchodilator.  FEV1 is 81% predicted, there is no significant improvement bronchodilator.  Ratio is 90%.  TLC is 80% protected, RV is 85%.  RV to TLC ratio is 110%.  Flow volume loop appears mildly restricted.  DLCO was not reported. -Overall this test shows normal pulmonary functions without evidence of COPD.  **ACE, ANCA, alpha-1 11/20/2017>> ACE level 31, ANCA negative, alpha-1 level 150. **CT chest 12/09/2017>> reduced lung volumes secondary to obesity, multiple bilateral small emphysematous blebs, evidence of air-trapping. **Absolute eosinophil count 10/07/2017>> 200 **Imaging including CT chest 12/29/2015, chest x-ray 07/01/2017>> bibasilar groundglass changes, reduced lung volumes due to obesity. **Spirometry 03/03/2017>> FVC is 71%, FEV1 at 80% predicted, ratio is 89%.    Forced expiratory time 3.4 seconds.  Overall this test is suggestive of restriction however inadequate forced extra Tory time makes the test suboptimal. **IgE 01/16/16 @[facility]; 31  Medication:    Current Outpatient Medications:  .  albuterol (PROAIR HFA) 108 (90 Base) MCG/ACT inhaler, Inhale 2 puffs into the lungs every 6 (six) hours as needed for wheezing or shortness of breath., Disp: 8.5 g, Rfl:  11 .  azithromycin (ZITHROMAX Z-PAK) 250 MG tablet, Take 2 tablets (500 mg) on  Day 1,  followed by 1 tablet (250 mg) once daily on Days 2 through 5., Disp: 6 each, Rfl: 0 .  benzonatate (TESSALON PERLES) 100 MG capsule, Take 2 capsules (200 mg total) by mouth 3 (three) times daily., Disp: 90 capsule, Rfl: 2 .  buPROPion (WELLBUTRIN SR) 150 MG 12 hr tablet, Take 1 tablet (150 mg total) by mouth 3 (three) times daily., Disp: 270 tablet, Rfl: 1 .  cyclobenzaprine (FLEXERIL) 5 MG tablet, TAKE ONE TABLET BY MOUTH 3 TIMES DAILY AS NEEDED FOR MUSCLE SPASM, Disp: 30 tablet, Rfl: 1 .  diphenoxylate-atropine (LOMOTIL) 2.5-0.025 MG tablet, TAKE 1 TABLET BY MOUTH 4 TIMES DAILY AS NEEDED FOR DIARRHEA OR LOOSE STOOLS, Disp: 30 tablet, Rfl: 1 .  fesoterodine (TOVIAZ) 4 MG TB24 tablet, Take 1 tablet (4 mg total) by mouth daily., Disp: 90 tablet, Rfl: 1 .  fexofenadine-pseudoephedrine (ALLEGRA-D 24) 180-240 MG 24 hr tablet, Take 1 tablet by mouth daily., Disp: , Rfl:  .  fluticasone (FLONASE) 50 MCG/ACT nasal spray, Place 2 sprays into both nostrils 2 (two) times daily., Disp: , Rfl:  .  Fluticasone-Umeclidin-Vilant (TRELEGY ELLIPTA) 100-62.5-25 MCG/INH AEPB, Inhale 1 puff into the lungs daily., Disp: 90 each, Rfl: 3 .  guaiFENesin-codeine 100-10 MG/5ML syrup, Take 5 mLs by mouth every 6 (six) hours as needed for cough., Disp: 120 mL, Rfl: 0 .  hydrochlorothiazide (HYDRODIURIL) 25 MG tablet, Take 1 tablet (25 mg total) by mouth daily., Disp: 90 tablet, Rfl: 1 .  hyoscyamine (LEVSIN SL) 0.125 MG SL tablet, TAKE ONE TABLET BY MOUTH EVERY 4 HOURS AS NEEDED, Disp: 540 tablet, Rfl: 1 .  ipratropium-albuterol (DUONEB) 0.5-2.5 (3) MG/3ML SOLN, Take 3 mLs by nebulization every 6 (six) hours as needed., Disp: 360 mL, Rfl: 3 .  montelukast (SINGULAIR) 10 MG tablet, Take 1 tablet (10 mg total) by mouth at bedtime., Disp: 90 tablet, Rfl: 3 .  MYRBETRIQ 50 MG TB24 tablet, Take 50 mg by mouth daily., Disp: , Rfl:  .  omeprazole  (PRILOSEC) 40 MG capsule, TAKE 1 CAPSULE BY MOUTH EVERY DAY, Disp: 30 capsule, Rfl: 2 .  predniSONE (DELTASONE) 20 MG tablet, Take 2 tablets (40 mg total) by mouth daily., Disp: 10 tablet, Rfl: 0   Allergies:  Amoxicillin  Review of Systems  Constitutional: Negative for chills and fever.  HENT: Negative for hearing loss and tinnitus.   Eyes: Negative for blurred vision and redness.  Respiratory: Positive for cough. Negative for hemoptysis.   Cardiovascular: Negative for claudication and leg swelling.  Gastrointestinal: Negative for blood in stool and melena.  Genitourinary: Negative for flank pain and hematuria.  Musculoskeletal: Negative for falls and joint pain.  Skin: Negative for itching and rash.  Neurological: Negative for seizures and loss of consciousness.  Endo/Heme/Allergies: Negative for environmental allergies and polydipsia.  Psychiatric/Behavioral: Negative for memory loss. The patient is not nervous/anxious.     Physical Examination:   VS: BP 122/76 (BP Location: Left Arm, Cuff Size: Large)   Pulse 67     Resp 16   Ht 4' 11" (1.499 m)   Wt 209 lb (94.8 kg)   SpO2 98%   BMI 42.21 kg/m   General Appearance: No distress  Neuro:without focal findings, mental status, speech normal, alert and oriented HEENT: PERRLA, EOM intact Pulmonary: No wheezing, No rales  CardiovascularNormal S1,S2.  No m/r/g.  Abdomen: Benign, Soft, non-tender, No masses Renal:  No costovertebral tenderness  GU:  No performed at this time. Endoc: No evident thyromegaly, no signs of acromegaly or Cushing features Skin:   warm, no rashes, no ecchymosis  Extremities: normal, no cyanosis, clubbing.      LABORATORY PANEL:   CBC Recent Labs  Lab 01/18/18 2216  WBC 6.9  HGB 13.2  HCT 39.0  PLT 250   ------------------------------------------------------------------------------------------------------------------  Chemistries  Recent Labs  Lab 01/18/18 2216  NA 141  K 3.3*  CL  106  CO2 26  GLUCOSE 139*  BUN 15  CREATININE 0.78  CALCIUM 9.1   ------------------------------------------------------------------------------------------------------------------  Cardiac Enzymes No results for input(s): TROPONINI in the last 168 hours. ------------------------------------------------------------  RADIOLOGY:  Dg Chest 2 View  Result Date: 01/19/2018 CLINICAL DATA:  Cough and shortness of breath. EXAM: CHEST - 2 VIEW COMPARISON:  Radiograph 07/01/2017.  CT 12/09/2017 FINDINGS: Unchanged heart size and mediastinal contours. Mild chronic bronchitic change. No confluent airspace disease, pleural effusion or pneumothorax. No evidence of pulmonary edema. Degenerative change in the spine without acute osseous abnormality. IMPRESSION: No acute findings.  Mild chronic bronchitic change. Electronically Signed   By: Melanie  Sanford M.D.   On: 01/19/2018 01:15       Thank  you for the consultation and for allowing [facility] to assist in the care of your patient. Our recommendations are noted above.  Please contact us if we can be of further service.   Deep Teriah Muela, M.D., F.C.C.P.  Board Certified in Internal Medicine, Pulmonary Medicine, Critical Care Medicine, and Sleep Medicine.  [place] [place] [facility] Office Number: [phone]   01/19/2018 

## 2018-01-19 NOTE — ED Provider Notes (Signed)
[facility] Emergency Department Provider Note  Time seen: 2:27 AM  I have reviewed the triage vital signs and the nursing notes.   HISTORY  Chief Complaint COPD    HPI [name] is a 62 y.o. female with a past medical history of asthma, depression, diabetes, hyperlipidemia, COPD, presents to the emergency department difficulty breathing.  Overall the patient appears well, no distress at this time.  She states for the past 3 days she has had a cough and tonight was feeling very short of breath and having difficulty breathing.  Took her home medications with some relief but was still having trouble breathing so she came to the emergency department for evaluation.  She states upon arrival she is feeling much better.  Mild shortness of breath currently denies any chest pain at any point.  Denies any sputum production or fever.  Past Medical History:  Diagnosis Date  . Asthma   . Depression   . Diabetes mellitus without complication (HCC)   . Hyperlipidemia   . Kidney stones   . Migraine headache     Patient Active Problem List   Diagnosis Date Noted  . Chronic bronchitis (HCC) 03/03/2017  . Mixed stress and urge urinary incontinence 02/25/2017  . Right sided weakness 09/26/2016  . Asthma 11/15/2015  . Low back pain 09/19/2015  . Essential hypertension 10/20/2014  . Diabetes mellitus without complication (HCC) 10/20/2014  . Celiac sprue 10/20/2014  . Depression 10/20/2014  . BMI 40.0-44.9, adult (HCC) 10/20/2014  . Fatty infiltration of liver 08/31/2014    Past Surgical History:  Procedure Laterality Date  . ABDOMINAL HYSTERECTOMY    . BREAST CYST ASPIRATION Right 1981   benign  . BREAST CYST ASPIRATION Right 1984   benign  . CHOLECYSTECTOMY    . COLONOSCOPY  March 2016  . OOPHORECTOMY      Prior to Admission medications   Medication Sig Start Date End Date Taking? Authorizing Provider  albuterol (PROAIR HFA) 108 (90 Base) MCG/ACT  inhaler Inhale 2 puffs into the lungs every 6 (six) hours as needed for wheezing or shortness of breath. 10/07/17   Johnson, Megan P, DO  benzonatate (TESSALON PERLES) 100 MG capsule Take 2 capsules (200 mg total) by mouth 3 (three) times daily. 12/10/17 12/10/18  Shane Crutch, MD  buPROPion (WELLBUTRIN SR) 150 MG 12 hr tablet Take 1 tablet (150 mg total) by mouth 3 (three) times daily. 10/07/17   Johnson, Megan P, DO  cyclobenzaprine (FLEXERIL) 5 MG tablet TAKE ONE TABLET BY MOUTH 3 TIMES DAILY AS NEEDED FOR MUSCLE SPASM 10/30/17   Steele Sizer, MD  diphenoxylate-atropine (LOMOTIL) 2.5-0.025 MG tablet TAKE 1 TABLET BY MOUTH 4 TIMES DAILY AS NEEDED FOR DIARRHEA OR LOOSE STOOLS 07/10/17   Steele Sizer, MD  fesoterodine (TOVIAZ) 4 MG TB24 tablet Take 1 tablet (4 mg total) by mouth daily. 10/07/17   Johnson, Megan P, DO  fexofenadine-pseudoephedrine (ALLEGRA-D 24) 180-240 MG 24 hr tablet Take 1 tablet by mouth daily.    [provider]  fluticasone (FLONASE) 50 MCG/ACT nasal spray Place 2 sprays into both nostrils 2 (two) times daily.    [provider]  Fluticasone-Umeclidin-Vilant (TRELEGY ELLIPTA) 100-62.5-25 MCG/INH AEPB Inhale 1 puff into the lungs daily. 10/07/17   Johnson, Megan P, DO  hydrochlorothiazide (HYDRODIURIL) 25 MG tablet Take 1 tablet (25 mg total) by mouth daily. 10/07/17   Johnson, Megan P, DO  hyoscyamine (LEVSIN SL) 0.125 MG SL tablet TAKE ONE TABLET  BY MOUTH EVERY 4 HOURS AS NEEDED 10/07/17   Johnson, Megan P, DO  ipratropium-albuterol (DUONEB) 0.5-2.5 (3) MG/3ML SOLN Take 3 mLs by nebulization every 6 (six) hours as needed. 10/07/17   Johnson, Megan P, DO  montelukast (SINGULAIR) 10 MG tablet Take 1 tablet (10 mg total) by mouth at bedtime. 10/07/17   Johnson, Megan P, DO  MYRBETRIQ 50 MG TB24 tablet Take 50 mg by mouth daily. 12/01/17   [provider]  omeprazole (PRILOSEC) 40 MG capsule TAKE 1 CAPSULE BY MOUTH EVERY DAY 12/01/17   Shane Crutch, MD    Allergies  Allergen Reactions  . Amoxicillin Hives    Family History  Problem Relation Age of Onset  . Hypertension Mother   . Cancer Mother        breast  . Arthritis Mother   . Alzheimer's disease Mother   . Parkinson's disease Mother   . Breast cancer Mother 33  . Hypertension Father   . Diabetes Father   . Kidney cancer Father   . Breast cancer Cousin 35  . Breast cancer Maternal Aunt   . Breast cancer Maternal Grandmother 33    Social History Social History   Tobacco Use  . Smoking status: Never Smoker  . Smokeless tobacco: Never Used  Substance Use Topics  . Alcohol use: Yes  . Drug use: No    Review of Systems Constitutional: Negative for fever. Cardiovascular: Negative for chest pain. Respiratory: Positive shortness of breath.  Positive cough. Gastrointestinal: Negative for abdominal pain Musculoskeletal: Negative for leg pain or swelling Skin: Negative for skin complaints  Neurological: Negative for headache All other ROS negative  ____________________________________________   PHYSICAL EXAM:  VITAL SIGNS: ED Triage Vitals  Enc Vitals Group     BP 01/18/18 2254 (!) 153/76     Pulse Rate 01/18/18 2254 64     Resp 01/18/18 2254 18     Temp --      Temp Source 01/18/18 2254 Oral     SpO2 01/18/18 2254 98 %     Weight 01/18/18 2254 204 lb (92.5 kg)     Height 01/18/18 2254 4\' 11"  (1.499 m)     Head Circumference --      Peak Flow --      Pain Score 01/18/18 2249 7     Pain Loc --      Pain Edu? --      Excl. in GC? --     Constitutional: Alert and oriented. Well appearing and in no distress. Eyes: Normal exam ENT   Head: Normocephalic and atraumatic   Mouth/Throat: Mucous membranes are moist. Cardiovascular: Normal rate, regular rhythm. No murmur Respiratory: Normal respiratory effort without tachypnea nor retractions. Breath sounds are clear  Gastrointestinal: Soft and nontender. No distention.   Musculoskeletal:  Nontender with normal range of motion in all extremities. No lower extremity tenderness or edema. Neurologic:  Normal speech and language. No gross focal neurologic deficits  Skin:  Skin is warm, dry and intact.  Psychiatric: Mood and affect are normal. Speech and behavior are normal.   ____________________________________________    EKG  EKG reviewed and interpreted by myself shows normal sinus rhythm at 69 bpm with a narrow QRS, normal axis, normal intervals, no ST changes  ____________________________________________    RADIOLOGY  Chest x-ray is clear  ____________________________________________   INITIAL IMPRESSION / ASSESSMENT AND PLAN / ED COURSE  Pertinent labs & imaging results that were available during my care of the  patient were reviewed by me and considered in my medical decision making (see chart for details).  Patient presents to the emergency department for shortness of breath and cough x3 days.  Differential would include COPD exacerbation, asthma exacerbation, URI, pneumonia.  Overall patient's work-up is reassuring including normal labs, chest x-ray is clear, EKG is normal.  We will place the patient on a short course of steroids as well as cough medication.  Patient states history of COPD and frequent infections we will cover with Zithromax as a precaution and patient will follow-up with her doctor.  Discussed my normal dyspnea return precautions.  ____________________________________________   FINAL CLINICAL IMPRESSION(S) / ED DIAGNOSES  Dyspnea Upper respiratory infection    Minna Antis, MD 01/19/18 0230

## 2018-01-19 NOTE — H&P (View-Only) (Signed)
[facility] Consultation      Assessment and Plan:  Acute asthma exacerbation. - Went to ED, currently on prednisone and antibiotics.  Will continue. -Continue trilogy.  Patient is currently taking Symbicort in addition to this, I asked her to stop that.  Chronic cough. -Chronic cough is been present for approximately a year, etiology uncertain.  She is not responded to usual empiric therapies. - Discussed with patient that as a final step in this process we would perform a bronchoscopy to rule out endobronchial lesions or atypical infections.  Patient would like to proceed. -Patient has a chronic cough which is been present for nearly a year, and is not responded to usual empiric therapies. - ACE, ANCA, alpha-1 negative.  Will send for immunoglobulin testing. - She is currently taking Tessalon 100 mg as needed, asked her to increase to 200 mg 3 times daily.  Emphysematous blebs. - Small cystic changes of both lungs which I suspect are emphysematous blebs.  Patient is a lifelong smoker, these legs do not appear large enough to call significant symptoms. - Alpha-1 normal, PFT does not show evidence of emphysema.  Allergic rhinitis. - Continue Flonase.   Orders Placed This Encounter  Procedures  . IgG, IgA, IgM    Return in about 1 month (around 02/20/2018) for 1 month after bronchoscopy. .   Date: 01/19/2018  MRN# [id number] [name] 02-21-1956  [name] is a 62 y.o. old female seen in consultation for chief complaint of:    Chief Complaint  Patient presents with  . Asthma    Pt was seen in ED 9/30 for cough and sob:    HPI:   The patient is a 62 year old female, last visit she was noted to have a chronic cough for approximately 1 year.  She had had a CT of the chest in the past which showed multiple small emphysematous blebs.  She was sent for serology cotesting including ACE, ANCA, alpha-1, IgE which were all negative.  Pulmonary  function testing was also ordered.  She was asked to use Tessalon 200 mg, Mucinex DM both 3 times daily.  Since her last visit she feels that she has been feeling "pretty good" she has been taking prilosec. However the cough is still present and essentially unchanged.  She takes Trelegy twice daily, symbicort once daily, and maxair about every day, feels that it helps short term. She is taking tessalon 100 mg once in a while.  She had a flare up recently with a lot of coughing and went to the ED. She received prednisone, abx, and is feeling better today.    She was previously seen by Dr. Meredeth Ide for asthma, cough.   **Pulmonary function test 01/15/2018>> tracings personally reviewed.  FVC 72% predicted, there is no significant improvement bronchodilator.  FEV1 is 81% predicted, there is no significant improvement bronchodilator.  Ratio is 90%.  TLC is 80% protected, RV is 85%.  RV to TLC ratio is 110%.  Flow volume loop appears mildly restricted.  DLCO was not reported. -Overall this test shows normal pulmonary functions without evidence of COPD.  **ACE, ANCA, alpha-1 11/20/2017>> ACE level 31, ANCA negative, alpha-1 level 150. **CT chest 12/09/2017>> reduced lung volumes secondary to obesity, multiple bilateral small emphysematous blebs, evidence of air-trapping. **Absolute eosinophil count 10/07/2017>> 200 **Imaging including CT chest 12/29/2015, chest x-ray 07/01/2017>> bibasilar groundglass changes, reduced lung volumes due to obesity. **Spirometry 03/03/2017>> FVC is 71%, FEV1 at 80% predicted, ratio is 89%.  Forced expiratory time 3.4 seconds.  Overall this test is suggestive of restriction however inadequate forced extra Tory time makes the test suboptimal. **IgE 01/16/16 @[facility] ; 31  Medication:    Current Outpatient Medications:  .  albuterol (PROAIR HFA) 108 (90 Base) MCG/ACT inhaler, Inhale 2 puffs into the lungs every 6 (six) hours as needed for wheezing or shortness of breath., Disp: 8.5 g, Rfl:  11 .  azithromycin (ZITHROMAX Z-PAK) 250 MG tablet, Take 2 tablets (500 mg) on  Day 1,  followed by 1 tablet (250 mg) once daily on Days 2 through 5., Disp: 6 each, Rfl: 0 .  benzonatate (TESSALON PERLES) 100 MG capsule, Take 2 capsules (200 mg total) by mouth 3 (three) times daily., Disp: 90 capsule, Rfl: 2 .  buPROPion (WELLBUTRIN SR) 150 MG 12 hr tablet, Take 1 tablet (150 mg total) by mouth 3 (three) times daily., Disp: 270 tablet, Rfl: 1 .  cyclobenzaprine (FLEXERIL) 5 MG tablet, TAKE ONE TABLET BY MOUTH 3 TIMES DAILY AS NEEDED FOR MUSCLE SPASM, Disp: 30 tablet, Rfl: 1 .  diphenoxylate-atropine (LOMOTIL) 2.5-0.025 MG tablet, TAKE 1 TABLET BY MOUTH 4 TIMES DAILY AS NEEDED FOR DIARRHEA OR LOOSE STOOLS, Disp: 30 tablet, Rfl: 1 .  fesoterodine (TOVIAZ) 4 MG TB24 tablet, Take 1 tablet (4 mg total) by mouth daily., Disp: 90 tablet, Rfl: 1 .  fexofenadine-pseudoephedrine (ALLEGRA-D 24) 180-240 MG 24 hr tablet, Take 1 tablet by mouth daily., Disp: , Rfl:  .  fluticasone (FLONASE) 50 MCG/ACT nasal spray, Place 2 sprays into both nostrils 2 (two) times daily., Disp: , Rfl:  .  Fluticasone-Umeclidin-Vilant (TRELEGY ELLIPTA) 100-62.5-25 MCG/INH AEPB, Inhale 1 puff into the lungs daily., Disp: 90 each, Rfl: 3 .  guaiFENesin-codeine 100-10 MG/5ML syrup, Take 5 mLs by mouth every 6 (six) hours as needed for cough., Disp: 120 mL, Rfl: 0 .  hydrochlorothiazide (HYDRODIURIL) 25 MG tablet, Take 1 tablet (25 mg total) by mouth daily., Disp: 90 tablet, Rfl: 1 .  hyoscyamine (LEVSIN SL) 0.125 MG SL tablet, TAKE ONE TABLET BY MOUTH EVERY 4 HOURS AS NEEDED, Disp: 540 tablet, Rfl: 1 .  ipratropium-albuterol (DUONEB) 0.5-2.5 (3) MG/3ML SOLN, Take 3 mLs by nebulization every 6 (six) hours as needed., Disp: 360 mL, Rfl: 3 .  montelukast (SINGULAIR) 10 MG tablet, Take 1 tablet (10 mg total) by mouth at bedtime., Disp: 90 tablet, Rfl: 3 .  MYRBETRIQ 50 MG TB24 tablet, Take 50 mg by mouth daily., Disp: , Rfl:  .  omeprazole  (PRILOSEC) 40 MG capsule, TAKE 1 CAPSULE BY MOUTH EVERY DAY, Disp: 30 capsule, Rfl: 2 .  predniSONE (DELTASONE) 20 MG tablet, Take 2 tablets (40 mg total) by mouth daily., Disp: 10 tablet, Rfl: 0   Allergies:  Amoxicillin  Review of Systems  Constitutional: Negative for chills and fever.  HENT: Negative for hearing loss and tinnitus.   Eyes: Negative for blurred vision and redness.  Respiratory: Positive for cough. Negative for hemoptysis.   Cardiovascular: Negative for claudication and leg swelling.  Gastrointestinal: Negative for blood in stool and melena.  Genitourinary: Negative for flank pain and hematuria.  Musculoskeletal: Negative for falls and joint pain.  Skin: Negative for itching and rash.  Neurological: Negative for seizures and loss of consciousness.  Endo/Heme/Allergies: Negative for environmental allergies and polydipsia.  Psychiatric/Behavioral: Negative for memory loss. The patient is not nervous/anxious.     Physical Examination:   VS: BP 122/76 (BP Location: Left Arm, Cuff Size: Large)   Pulse 67  Resp 16   Ht 4\' 11"  (1.499 m)   Wt 209 lb (94.8 kg)   SpO2 98%   BMI 42.21 kg/m   General Appearance: No distress  Neuro:without focal findings, mental status, speech normal, alert and oriented HEENT: PERRLA, EOM intact Pulmonary: No wheezing, No rales  CardiovascularNormal S1,S2.  No m/r/g.  Abdomen: Benign, Soft, non-tender, No masses Renal:  No costovertebral tenderness  GU:  No performed at this time. Endoc: No evident thyromegaly, no signs of acromegaly or Cushing features Skin:   warm, no rashes, no ecchymosis  Extremities: normal, no cyanosis, clubbing.      LABORATORY PANEL:   CBC Recent Labs  Lab 01/18/18 2216  WBC 6.9  HGB 13.2  HCT 39.0  PLT 250   ------------------------------------------------------------------------------------------------------------------  Chemistries  Recent Labs  Lab 01/18/18 2216  NA 141  K 3.3*  CL  106  CO2 26  GLUCOSE 139*  BUN 15  CREATININE 0.78  CALCIUM 9.1   ------------------------------------------------------------------------------------------------------------------  Cardiac Enzymes No results for input(s): TROPONINI in the last 168 hours. ------------------------------------------------------------  RADIOLOGY:  Dg Chest 2 View  Result Date: 01/19/2018 CLINICAL DATA:  Cough and shortness of breath. EXAM: CHEST - 2 VIEW COMPARISON:  Radiograph 07/01/2017.  CT 12/09/2017 FINDINGS: Unchanged heart size and mediastinal contours. Mild chronic bronchitic change. No confluent airspace disease, pleural effusion or pneumothorax. No evidence of pulmonary edema. Degenerative change in the spine without acute osseous abnormality. IMPRESSION: No acute findings.  Mild chronic bronchitic change. Electronically Signed   By: Narda Rutherford M.D.   On: 01/19/2018 01:15       Thank  you for the consultation and for allowing [facility] to assist in the care of your patient. Our recommendations are noted above.  Please contact us if we can be of further service.   Wells Guiles, M.D., F.C.C.P.  Board Certified in Internal Medicine, Pulmonary Medicine, Critical Care Medicine, and Sleep Medicine.  [facility] Office Number: [phone]   01/19/2018

## 2018-01-20 ENCOUNTER — Ambulatory Visit: Payer: 59 | Admitting: Internal Medicine

## 2018-01-20 ENCOUNTER — Telehealth: Payer: Self-pay | Admitting: *Deleted

## 2018-01-20 ENCOUNTER — Encounter: Payer: Self-pay | Admitting: Internal Medicine

## 2018-01-20 ENCOUNTER — Other Ambulatory Visit
Admission: RE | Admit: 2018-01-20 | Discharge: 2018-01-20 | Disposition: A | Discharge status: Home / Self Care | Payer: 59 | Source: Ambulatory Visit | Attending: Internal Medicine | Admitting: Internal Medicine

## 2018-01-20 VITALS — BP 122/76 | HR 67 | Resp 16 | Ht 59.0 in | Wt 209.0 lb

## 2018-01-20 DIAGNOSIS — J471 Bronchiectasis with (acute) exacerbation: Secondary | ICD-10-CM | POA: Diagnosis not present

## 2018-01-20 DIAGNOSIS — R05 Cough: Secondary | ICD-10-CM

## 2018-01-20 DIAGNOSIS — J4541 Moderate persistent asthma with (acute) exacerbation: Secondary | ICD-10-CM

## 2018-01-20 DIAGNOSIS — R059 Cough, unspecified: Secondary | ICD-10-CM

## 2018-01-20 NOTE — Addendum Note (Signed)
Addended by: Deloria Lair on: 01/20/2018 09:08 AM   Modules accepted: Orders

## 2018-01-20 NOTE — Telephone Encounter (Signed)
RegJulieta Bellini  CPT: 24401  DX: chronic cough  Dr. Nicholos Johns  01/26/18

## 2018-01-20 NOTE — Telephone Encounter (Signed)
01/20/18 at 3:21 pm EST spoke with Monica Martinez at [facility]. [id number] done outpatient does not require PA and is a valid and billable code. Call Ref # [id number]. Rhonda J Cobb

## 2018-01-20 NOTE — Patient Instructions (Addendum)
Will plan for bronchoscopy. Follow up one month later.  Take trelegy once daily, rinse mouth after use. Stop symbicort.  Take tessalon 200 mg three times daily.

## 2018-01-21 LAB — IGG, IGA, IGM
IGA: 114 mg/dL (ref 87–352)
IGG (IMMUNOGLOBIN G), SERUM: 651 mg/dL — AB (ref 700–1600)
IgM (Immunoglobulin M), Srm: 53 mg/dL (ref 26–217)

## 2018-01-26 ENCOUNTER — Encounter: Payer: Self-pay | Admitting: Emergency Medicine

## 2018-01-26 ENCOUNTER — Other Ambulatory Visit: Payer: Self-pay

## 2018-01-26 ENCOUNTER — Encounter
Admission: RE | Disposition: A | Discharge status: Home / Self Care | Payer: Self-pay | Source: Ambulatory Visit | Attending: Internal Medicine

## 2018-01-26 ENCOUNTER — Ambulatory Visit
Admission: RE | Admit: 2018-01-26 | Discharge: 2018-01-26 | Disposition: A | Discharge status: Home / Self Care | Payer: 59 | Source: Ambulatory Visit | Attending: Internal Medicine | Admitting: Internal Medicine

## 2018-01-26 DIAGNOSIS — F17200 Nicotine dependence, unspecified, uncomplicated: Secondary | ICD-10-CM | POA: Insufficient documentation

## 2018-01-26 DIAGNOSIS — R05 Cough: Secondary | ICD-10-CM

## 2018-01-26 DIAGNOSIS — R059 Cough, unspecified: Secondary | ICD-10-CM

## 2018-01-26 DIAGNOSIS — J45909 Unspecified asthma, uncomplicated: Secondary | ICD-10-CM | POA: Insufficient documentation

## 2018-01-26 DIAGNOSIS — Z79899 Other long term (current) drug therapy: Secondary | ICD-10-CM | POA: Diagnosis not present

## 2018-01-26 DIAGNOSIS — J42 Unspecified chronic bronchitis: Secondary | ICD-10-CM | POA: Insufficient documentation

## 2018-01-26 DIAGNOSIS — J4541 Moderate persistent asthma with (acute) exacerbation: Secondary | ICD-10-CM

## 2018-01-26 DIAGNOSIS — Z7951 Long term (current) use of inhaled steroids: Secondary | ICD-10-CM | POA: Insufficient documentation

## 2018-01-26 HISTORY — PX: FLEXIBLE BRONCHOSCOPY: SHX5094

## 2018-01-26 LAB — GLUCOSE, CAPILLARY: Glucose-Capillary: 111 mg/dL — ABNORMAL HIGH (ref 70–99)

## 2018-01-26 SURGERY — BRONCHOSCOPY, FLEXIBLE
Anesthesia: Moderate Sedation

## 2018-01-26 MED ORDER — SODIUM CHLORIDE 0.9 % IV SOLN
INTRAVENOUS | Status: DC
  Administered 2018-01-26: 12:00:00 via INTRAVENOUS

## 2018-01-26 MED ORDER — MIDAZOLAM HCL 2 MG/2ML IJ SOLN
INTRAMUSCULAR | Status: DC | PRN
  Administered 2018-01-26: 2 mg via INTRAVENOUS
  Administered 2018-01-26 (×2): 1 mg via INTRAVENOUS

## 2018-01-26 MED ORDER — PHENYLEPHRINE HCL 0.25 % NA SOLN
1.0000 | Freq: Four times a day (QID) | NASAL | Status: DC | PRN
  Filled 2018-01-26: qty 15

## 2018-01-26 MED ORDER — FENTANYL CITRATE (PF) 100 MCG/2ML IJ SOLN
INTRAMUSCULAR | Status: AC
  Filled 2018-01-26: qty 6

## 2018-01-26 MED ORDER — BUTAMBEN-TETRACAINE-BENZOCAINE 2-2-14 % EX AERO
1.0000 | INHALATION_SPRAY | Freq: Once | CUTANEOUS | Status: DC
  Filled 2018-01-26: qty 20

## 2018-01-26 MED ORDER — MIDAZOLAM HCL 2 MG/2ML IJ SOLN
INTRAMUSCULAR | Status: AC
  Filled 2018-01-26: qty 8

## 2018-01-26 MED ORDER — FENTANYL CITRATE (PF) 100 MCG/2ML IJ SOLN
INTRAMUSCULAR | Status: DC | PRN
  Administered 2018-01-26 (×3): 25 ug via INTRAVENOUS

## 2018-01-26 MED ORDER — LIDOCAINE HCL 2 % EX GEL
1.0000 "application " | Freq: Once | CUTANEOUS | Status: DC
  Filled 2018-01-26: qty 5

## 2018-01-26 NOTE — Op Note (Addendum)
  [facility]            Bronchoscopy Note   FINDINGS/SUMMARY:  --Findings of severe chronic bronchitis, with chronic erythematous mucosa with erythema throughout both airways. - Thick mucosal secretions which were suctioned and removed. - Bronchoalveolar lavage taken at the right middle lobe, as well as bronchial brushings. - Endobronchial forceps biopsies taken from the right middle lobe as well.  Indication: Intractable cough, chronic bronchitis. The patient (or their representative) was informed of the risks (including but not limited to bleeding, infection, respiratory failure, lung injury, tooth/oral injury) and benefits of the procedure and gave consent, see chart.   Pre-op diagnosis: Chronic bronchitis, intractable cough. Post-op diagnosis: Chronic bronchitis. Estimated blood loss: 5 cc  Medications for procedure: Fentanyl 75 mcg, Versed 4 mg.  I was present for the duration of conscious sedation time of 25 minutes.  Patient's oxygen saturations remained above 90% for the duration of the procedure.  Procedure description:  After obtaining informed consent, timeout was called to confirm the patient the procedure.  The right and left nares were checked for patency, the right nares appeared to be more patent, this was anesthetized with topical lidocaine.  The bronchoscope was passed via the right nares, to the posterior pharynx, normal movements of the vocal cords were visualized.  The bronchoscope was then taken to the trachea, lidocaine was applied to the main trachea as well as the right and left mainstem bronchi.  An anatomical tour was undertaken, there was thick mucosal secretions throughout both airways were suctioned and removed.  There was a chronic erythematous mucosa throughout both airways.  All segments were visualized, no anatomical abnormalities are noted.  In the right upper lobe, the right r apical segment appeared to arise from the anterior segment  bronchus. The bronchoscope was wedged in the right middle lobe, brushings were obtained, endobronchial lesions from the entrance to the right middle lobe were taken, bronchoalveolar lavage was then performed of the right middle lobe.  As adequate samples have been taken, the bronchoscope was removed, patient was taken to recovery.   Condition post procedure: Stable.   Complications: None noted.    Marda Stalker, M.D., F.C.C.P. Board Certified in Internal Medicine, Pulmonary Medicine, [place], and Sleep Medicine.  [facility] Office Number: [phone]  01/26/2018

## 2018-01-26 NOTE — Interval H&P Note (Signed)
History and Physical Interval Note:  01/26/2018 12:14 PM  [name]  has presented today for surgery, with the diagnosis of CHRONIC COUGH  The various methods of treatment have been discussed with the patient and family. After consideration of risks, benefits and other options for treatment, the patient has consented to  Procedure(s): FLEXIBLE BRONCHOSCOPY (N/A) as a surgical intervention .  The patient's history has been reviewed, patient examined, no change in status, stable for surgery.  I have reviewed the patient's chart and labs.  Questions were answered to the patient's satisfaction.     Laverle Hobby

## 2018-01-26 NOTE — Progress Notes (Signed)
Coughing but nonproductive

## 2018-01-27 LAB — CYTOLOGY - NON PAP

## 2018-01-27 LAB — SURGICAL PATHOLOGY

## 2018-01-28 ENCOUNTER — Other Ambulatory Visit: Payer: Self-pay | Admitting: Family Medicine

## 2018-01-28 ENCOUNTER — Telehealth: Payer: Self-pay | Admitting: Internal Medicine

## 2018-01-28 LAB — CULTURE, BAL-QUANTITATIVE W GRAM STAIN: Special Requests: NORMAL

## 2018-01-28 LAB — CULTURE, BAL-QUANTITATIVE
CULTURE: NORMAL — AB
GRAM STAIN: NONE SEEN

## 2018-01-28 NOTE — Telephone Encounter (Signed)
Pt called to check status of results of bronch. She says she was told to call and informed if she was still running a fever and if so abx would be called in. Pt states she is still running a fever.

## 2018-01-28 NOTE — Telephone Encounter (Signed)
Patient calling to check the status of the results for bronch Please call to discuss

## 2018-01-29 NOTE — Telephone Encounter (Signed)
So far everything is negative. What is her temperature?

## 2018-01-29 NOTE — Telephone Encounter (Signed)
Pt advised to take Tylenol for low grade fever as all her cultures that came back thus far are negative. We will call her with results when they all come back. Nothing further needed.

## 2018-02-13 ENCOUNTER — Telehealth: Payer: Self-pay | Admitting: Internal Medicine

## 2018-02-13 NOTE — Telephone Encounter (Signed)
Patient calling for results Please call to discuss, would like to know ASAP

## 2018-02-16 ENCOUNTER — Encounter: Payer: Self-pay | Admitting: Family Medicine

## 2018-02-16 ENCOUNTER — Ambulatory Visit: Payer: 59 | Admitting: Family Medicine

## 2018-02-16 DIAGNOSIS — J42 Unspecified chronic bronchitis: Secondary | ICD-10-CM | POA: Diagnosis not present

## 2018-02-16 DIAGNOSIS — F3342 Major depressive disorder, recurrent, in full remission: Secondary | ICD-10-CM | POA: Diagnosis not present

## 2018-02-16 DIAGNOSIS — I10 Essential (primary) hypertension: Secondary | ICD-10-CM | POA: Diagnosis not present

## 2018-02-16 LAB — CULTURE, FUNGUS WITHOUT SMEAR: SPECIAL REQUESTS: NORMAL

## 2018-02-16 MED ORDER — OMEPRAZOLE 40 MG PO CPDR: 40.0000 mg | DELAYED_RELEASE_CAPSULE | Freq: Every day | ORAL | 1 refills | Status: DC

## 2018-02-16 MED ORDER — DIPHENOXYLATE-ATROPINE 2.5-0.025 MG PO TABS: ORAL_TABLET | ORAL | 1 refills | Status: DC

## 2018-02-16 MED ORDER — BUPROPION HCL ER (SR) 150 MG PO TB12: 150.0000 mg | ORAL_TABLET | Freq: Three times a day (TID) | ORAL | 1 refills | Status: DC

## 2018-02-16 MED ORDER — MYRBETRIQ 50 MG PO TB24: 50.0000 mg | ORAL_TABLET | Freq: Every day | ORAL | 2 refills | Status: DC

## 2018-02-16 MED ORDER — DULOXETINE HCL 30 MG PO CPEP: 30.0000 mg | ORAL_CAPSULE | Freq: Every day | ORAL | 0 refills | Status: DC

## 2018-02-16 MED ORDER — LORAZEPAM 1 MG PO TABS: 0.5000 mg | ORAL_TABLET | Freq: Every day | ORAL | 1 refills | Status: DC | PRN

## 2018-02-16 MED ORDER — DULOXETINE HCL 60 MG PO CPEP: 60.0000 mg | ORAL_CAPSULE | Freq: Every day | ORAL | 3 refills | Status: DC

## 2018-02-16 MED ORDER — HYDROCHLOROTHIAZIDE 25 MG PO TABS: 25.0000 mg | ORAL_TABLET | Freq: Every day | ORAL | 1 refills | Status: DC

## 2018-02-16 NOTE — Assessment & Plan Note (Signed)
Patient with a great deal of anxiety associated with depression patient very reluctant to give up Wellbutrin will continue current dose of Wellbutrin will add Cymbalta 30 mg for 1 week then 60 mg. Also will add lorazepam 0.5 to 1 mg as needed daily as needed for anxiety discussed appropriate use especially with heavy coughing or heavy anxiety.  Patient will evaluate if helps her cough.

## 2018-02-16 NOTE — Progress Notes (Signed)
BP (!) 154/95   Pulse 68   Wt 202 lb (91.6 kg)   SpO2 98%   BMI 40.80 kg/m    Subjective:    Patient ID: [name], female    DOB: Mar 07, 1956, 62 y.o.   MRN: [id number]  HPI: [name] is a 62 y.o. female  Chief Complaint  Patient presents with  . Concentration  . Memory Loss   Patient follow-up under deal of stress dealing with ex-husband son, and work.  Patient's also had multiple serious health issues collapsed lung extensive pulmonary work-up for chronic cough is been negative to date. Reviewed patient's medications she is wondering about ADHD reviewed Mini-Mental status exam missed 1 of the memory items otherwise normal exam.  Relevant past medical, surgical, family and social history reviewed and updated as indicated. Interim medical history since our last visit reviewed. Allergies and medications reviewed and updated.  Review of Systems  Constitutional: Negative.   Respiratory: Negative.   Cardiovascular: Negative.     Per HPI unless specifically indicated above     Objective:    BP (!) 154/95   Pulse 68   Wt 202 lb (91.6 kg)   SpO2 98%   BMI 40.80 kg/m   Wt Readings from Last 3 Encounters:  02/16/18 202 lb (91.6 kg)  01/26/18 209 lb (94.8 kg)  01/20/18 209 lb (94.8 kg)    Physical Exam  Constitutional: She is oriented to person, place, and time. She appears well-developed and well-nourished.  HENT:  Head: Normocephalic and atraumatic.  Eyes: Conjunctivae and EOM are normal.  Neck: Normal range of motion.  Cardiovascular: Normal rate, regular rhythm and normal heart sounds.  Pulmonary/Chest: Effort normal and breath sounds normal.  Musculoskeletal: Normal range of motion.  Neurological: She is alert and oriented to person, place, and time.  Skin: No erythema.  Psychiatric: She has a normal mood and affect. Her behavior is normal. Judgment and thought content normal.    Results for orders placed or performed during the hospital  encounter of 01/26/18  Culture, bal-quantitative  Result Value Ref Range   Specimen Description      Bronch Lavag Performed at [facility], [address] [place], [place] [address]    Special Requests      Normal Performed at [facility], [place], [place] [address]    Gram Stain NO WBC SEEN NO ORGANISMS SEEN     Culture (A)     10,000 COLONIES/mL Consistent with normal respiratory flora. Performed at [facility], [place] [address], [place], [place] [address]    Report Status 01/28/2018 FINAL   Acid Fast Smear (AFB)  Result Value Ref Range   AFB Specimen Processing Concentration    Acid Fast Smear Negative    Source (AFB) PENDING   Culture, fungus without smear  Result Value Ref Range   Specimen Description      BRONCHIAL ALVEOLAR LAVAGE Performed at [facility], [address], [place], [place] [address]    Special Requests      Normal Performed at [facility], [address] [place], [place] [address]    Culture      NO FUNGUS ISOLATED AFTER 21 DAYS Performed at [facility], [place] [address] [place], [place] [address]    Report Status 02/16/2018 FINAL   Glucose, capillary  Result Value Ref Range   Glucose-Capillary 111 (H) 70 - 99 mg/dL  Cytology - Non PAP;  Result Value Ref Range   CYTOLOGY - NON GYN      Cytology - Non PAP CASE: [id number] PATIENT: [name] Non-Gyn Cytology Report     SPECIMEN SUBMITTED: A. Lung, right middle lobe; brushing  CLINICAL HISTORY: None Provided  PRE-OPERATIVE DIAGNOSIS: None provided  POST-OPERATIVE DIAGNOSIS: None provided.     DIAGNOSIS:   A.  LUNG, RIGHT MIDDLE LOBE; BRUSHING: - NEGATIVE FOR MALIGNANCY. - BENIGN BRONCHIAL EPITHELIUM WITH MIXED INFLAMMATION.  GROSS DESCRIPTION: A. Site: Right middle lobe lung Procedure: Bronchoscopy Cytotechnologist: Zyretta Madelyn Flavors Specimen(s) collected: 2 diff Quik stained  slides 0 Pap stained slides Specimen labeled: RML brushing                      Volume: Not applicable                      Description: Clear CytoLyt solution with a metallic brush                      Submitted for: ThinPrep   Final Diagnosis performed by Moises Blood, MD.   Electronically signed 01/27/2018 5:17:56PM The electronic signature indicates that the named Attending Patholo gist has evaluated the specimen  Technical component performed at [place], [address], [place], [place] [address] Lab: [phone] Dir: Rush Farmer, MD, MMM  Professional component performed at [facility], [facility], [place], [place], [place] [address] Lab: [phone] Dir: Dellia Nims. Reuel Derby, MD   Cytology - Non PAP;  Result Value Ref Range   CYTOLOGY - NON GYN      Cytology - Non PAP CASE: [id number] PATIENT: [name] Non-Gyn Cytology Report     SPECIMEN SUBMITTED: A. Lung, right middle lobe; lavage  CLINICAL HISTORY: None Provided  PRE-OPERATIVE DIAGNOSIS: None provided  POST-OPERATIVE DIAGNOSIS: None provided.     DIAGNOSIS:   A.  LUNG, RIGHT MIDDLE LOBE; LAVAGE: - NEGATIVE FOR MALIGNANCY. - BENIGN BRONCHIAL EPITHELIUM. - THIS INCLUDES INTERPRETATION OF CELL BLOCK MATERIAL.  GROSS DESCRIPTION: A. Site: Right middle lobe lung Procedure: Bronchoscopy Cytotechnologist: Zyretta Madelyn Flavors Specimen(s) collected: 0 diff Quik stained slides 0 Pap stained slides Specimen labeled: RML BAL                      Volume: 20 mL                      Description: Cloudy red fluid                      Submitted for: ThinPrep and one cell block   Final Diagnosis performed by Moises Blood, MD.   Electronically signed 01/27/2018 5:18:10PM The electronic signature indicates that the named Attending Pat hologist has evaluated the specimen  Technical component performed at [place], [address], [place], [place] [address] Lab: [phone]  Dir: Rush Farmer, MD, MMM  Professional component performed at [facility], [facility], [place], [place], [place] [address] Lab: [phone] Dir: Dellia Nims. Reuel Derby, MD   Surgical pathology  Result Value Ref Range   SURGICAL PATHOLOGY      Surgical Pathology CASE: [id number] PATIENT: Chyanne Smith Surgical Pathology Report     SPECIMEN SUBMITTED: A. Lung, right middle lobe  CLINICAL HISTORY: None provided  PRE-OPERATIVE DIAGNOSIS: None provided  POST-OPERATIVE DIAGNOSIS: None provided.     DIAGNOSIS:  A.  LUNG, RIGHT MIDDLE LOBE; BRONCHOSCOPY: -  FRAGMENTS OF BRONCHIAL EPITHELIUM WITH FOCAL ACUTE AND CHRONIC INFLAMMATION, CONSISTENT WITH BRONCHITIS. - NEGATIVE FOR MALIGNANCY.   GROSS DESCRIPTION: A. Labeled: RML biopsy Received: In formalin Tissue fragment(s): Multiple Size: Aggregate, 1.0 x 0.5 x 0.1 cm Description: Friable pink-tan fragments of tissue and clot material Entirely submitted in one cassette.  2 Diff Quik stained slides also prepared at the procedure  Final Diagnosis performed by Moises Blood, MD.   Electronically signed 01/27/2018 5:13:18PM The electronic signature indicates that the named Attending Pathologist has evaluated the specimen  Technical compone nt performed at [place], [address], [place], [place] [address] Lab: [phone] Dir: Rush Farmer, MD, MMM  Professional component performed at [facility], [facility], [place], [place], [place] [address] Lab: [phone] Dir: Dellia Nims. Rubinas, MD       Assessment & Plan:   Problem List Items Addressed This Visit      Cardiovascular and Mediastinum   Essential hypertension   Relevant Medications   hydrochlorothiazide (HYDRODIURIL) 25 MG tablet     Respiratory   Chronic bronchitis (HCC)    Discussed chronic cough and role with anxiety see notes made below        Other   Depression    Patient with a great deal of  anxiety associated with depression patient very reluctant to give up Wellbutrin will continue current dose of Wellbutrin will add Cymbalta 30 mg for 1 week then 60 mg. Also will add lorazepam 0.5 to 1 mg as needed daily as needed for anxiety discussed appropriate use especially with heavy coughing or heavy anxiety.  Patient will evaluate if helps her cough.      Relevant Medications   LORazepam (ATIVAN) 1 MG tablet   DULoxetine (CYMBALTA) 30 MG capsule   DULoxetine (CYMBALTA) 60 MG capsule   buPROPion (WELLBUTRIN SR) 150 MG 12 hr tablet       Follow up plan: Return in about 4 weeks (around 03/16/2018).

## 2018-02-16 NOTE — Telephone Encounter (Signed)
Notified patient results normal thus far. Appt scheduled 11/5.

## 2018-02-16 NOTE — Assessment & Plan Note (Signed)
Discussed chronic cough and role with anxiety see notes made below

## 2018-02-16 NOTE — Telephone Encounter (Signed)
Patient calling to check status of results .  She was told someone would call on Friday.  Please call asap.

## 2018-02-18 ENCOUNTER — Ambulatory Visit: Payer: 59 | Admitting: Family Medicine

## 2018-02-23 NOTE — Progress Notes (Deleted)
[facility] Consultation      Assessment and Plan:  Severe persistent asthma with history of exacerbations. Micah Flesher to ED, currently on prednisone and antibiotics.  Will continue. -Continue trilogy.  Patient is currently taking Symbicort in addition to this, I asked her to stop that.  Chronic cough. -Chronic cough is been present for approximately a year, etiology uncertain.  She is not responded to usual empiric therapies. - Discussed with patient that as a final step in this process we would perform a bronchoscopy to rule out endobronchial lesions or atypical infections.  Patient would like to proceed. -Patient has a chronic cough which is been present for nearly a year, and is not responded to usual empiric therapies. - ACE, ANCA, alpha-1 negative.  Will send for immunoglobulin testing. - She is currently taking Tessalon 100 mg as needed, asked her to increase to 200 mg 3 times daily.  Emphysema, emphysematous blebs. - Small cystic changes of both lungs which I suspect are emphysematous blebs.  Patient is a lifelong smoker, these legs do not appear large enough to call significant symptoms. - Alpha-1 normal, PFT does not show evidence of COPD.  Allergic rhinitis. - Continue Flonase.   No orders of the defined types were placed in this encounter.   No follow-ups on file.   Date: 02/23/2018  MRN# [id number] [name] August 18, 1955  [name] Husband is a 32 y.o. old female seen in consultation for chief complaint of:    No chief complaint on file.   HPI:  The patient is a 62 year old female with chronic cough for about 1 year.  CT of the chest showed small emphysematous blebs, serology negative, alpha-1 negative, IgE negative.  PFT showed no evidence of COPD.  Since her last visit she has undergone bronchoscopy which was negative for malignancy, showed only evidence of chronic inflammation.  The patient is a 61 year old female, last visit she was  noted to have a chronic cough for approximately 1 year.  She had had a CT of the chest in the past which showed multiple small emphysematous blebs.  She was sent for serology cotesting including ACE, ANCA, alpha-1, IgE which were all negative.  Pulmonary function testing was also ordered.  She was asked to use Tessalon 200 mg, Mucinex DM both 3 times daily.  Since her last visit she feels that she has been feeling "pretty good" she has been taking prilosec. However the cough is still present and essentially unchanged.  She takes Trelegy twice daily, symbicort once daily, and maxair about every day, feels that it helps short term. She is taking tessalon 100 mg once in a while.  She had a flare up recently with a lot of coughing and went to the ED. She received prednisone, abx, and is feeling better today.      **Bronchoscopy 01/26/2018>> micro showed normal respiratory flora, pathology negative for malignancy, showed evidence of chronic inflammation. **Pulmonary function test 01/15/2018>> tracings personally reviewed.  FVC 72% predicted, there is no significant improvement bronchodilator.  FEV1 is 81% predicted, there is no significant improvement bronchodilator.  Ratio is 90%.  TLC is 80% protected, RV is 85%.  RV to TLC ratio is 110%.  Flow volume loop appears mildly restricted.  DLCO was not reported. -Overall this test shows normal pulmonary functions without evidence of COPD.  **ACE, ANCA, alpha-1 11/20/2017>> ACE level 31, ANCA negative, alpha-1 level 150. **CT chest 12/09/2017>> reduced lung volumes secondary to obesity, multiple bilateral small emphysematous  blebs, evidence of air-trapping. **Absolute eosinophil count 10/07/2017>> 200 **Imaging including CT chest 12/29/2015, chest x-ray 07/01/2017>> bibasilar groundglass changes, reduced lung volumes due to obesity. **Spirometry 03/03/2017>> FVC is 71%, FEV1 at 80% predicted, ratio is 89%.  Forced expiratory time 3.4 seconds.  Overall this test is  suggestive of restriction however inadequate forced extra Tory time makes the test suboptimal. **IgE 01/16/16 @[facility] ; 31  Medication:    Current Outpatient Medications:  .  albuterol (PROAIR HFA) 108 (90 Base) MCG/ACT inhaler, Inhale 2 puffs into the lungs every 6 (six) hours as needed for wheezing or shortness of breath., Disp: 8.5 g, Rfl: 11 .  benzonatate (TESSALON PERLES) 100 MG capsule, Take 2 capsules (200 mg total) by mouth 3 (three) times daily. (Patient not taking: Reported on 02/16/2018), Disp: 90 capsule, Rfl: 2 .  buPROPion (WELLBUTRIN SR) 150 MG 12 hr tablet, Take 1 tablet (150 mg total) by mouth 3 (three) times daily., Disp: 270 tablet, Rfl: 1 .  cyclobenzaprine (FLEXERIL) 5 MG tablet, TAKE ONE TABLET BY MOUTH 3 TIMES DAILY AS NEEDED FOR MUSCLE SPASM (Patient taking differently: Take 5 mg by mouth 3 (three) times daily as needed for muscle spasms. ), Disp: 30 tablet, Rfl: 1 .  diphenoxylate-atropine (LOMOTIL) 2.5-0.025 MG tablet, TAKE 1 TABLET BY MOUTH 4 TIMES DAILY AS NEEDED FOR DIARRHEA OR LOOSE STOOLS, Disp: 90 tablet, Rfl: 1 .  DULoxetine (CYMBALTA) 30 MG capsule, Take 1 capsule (30 mg total) by mouth daily., Disp: 7 capsule, Rfl: 0 .  DULoxetine (CYMBALTA) 60 MG capsule, Take 1 capsule (60 mg total) by mouth daily., Disp: 30 capsule, Rfl: 3 .  fesoterodine (TOVIAZ) 4 MG TB24 tablet, Take 1 tablet (4 mg total) by mouth daily. (Patient not taking: Reported on 02/16/2018), Disp: 90 tablet, Rfl: 1 .  fexofenadine-pseudoephedrine (ALLEGRA-D 24) 180-240 MG 24 hr tablet, Take 1 tablet by mouth daily., Disp: , Rfl:  .  fluticasone (FLONASE) 50 MCG/ACT nasal spray, Place 2 sprays into both nostrils 2 (two) times daily., Disp: , Rfl:  .  Fluticasone-Umeclidin-Vilant (TRELEGY ELLIPTA) 100-62.5-25 MCG/INH AEPB, Inhale 1 puff into the lungs daily., Disp: 90 each, Rfl: 3 .  guaiFENesin-codeine 100-10 MG/5ML syrup, Take 5 mLs by mouth every 6 (six) hours as needed for cough. (Patient not taking:  Reported on 02/16/2018), Disp: 120 mL, Rfl: 0 .  hydrochlorothiazide (HYDRODIURIL) 25 MG tablet, Take 1 tablet (25 mg total) by mouth daily., Disp: 90 tablet, Rfl: 1 .  hyoscyamine (LEVSIN SL) 0.125 MG SL tablet, TAKE ONE TABLET BY MOUTH EVERY 4 HOURS AS NEEDED, Disp: 540 tablet, Rfl: 1 .  ipratropium-albuterol (DUONEB) 0.5-2.5 (3) MG/3ML SOLN, Take 3 mLs by nebulization every 6 (six) hours as needed. (Patient not taking: Reported on 02/16/2018), Disp: 360 mL, Rfl: 3 .  LORazepam (ATIVAN) 1 MG tablet, Take 0.5-1 tablets (0.5-1 mg total) by mouth daily as needed for anxiety., Disp: 30 tablet, Rfl: 1 .  montelukast (SINGULAIR) 10 MG tablet, Take 1 tablet (10 mg total) by mouth at bedtime., Disp: 90 tablet, Rfl: 3 .  MYRBETRIQ 50 MG TB24 tablet, Take 1 tablet (50 mg total) by mouth daily., Disp: 90 tablet, Rfl: 2 .  omeprazole (PRILOSEC) 40 MG capsule, Take 1 capsule (40 mg total) by mouth daily., Disp: 90 capsule, Rfl: 1   Allergies:  Amoxicillin      LABORATORY PANEL:   CBC No results for input(s): WBC, HGB, HCT, PLT in the last 168 hours. ------------------------------------------------------------------------------------------------------------------  Chemistries  No results for input(s):  NA, K, CL, CO2, GLUCOSE, BUN, CREATININE, CALCIUM, MG, AST, ALT, ALKPHOS, BILITOT in the last 168 hours.  Invalid input(s): GFRCGP ------------------------------------------------------------------------------------------------------------------  Cardiac Enzymes No results for input(s): TROPONINI in the last 168 hours. ------------------------------------------------------------  RADIOLOGY:  No results found.     Thank  you for the consultation and for allowing [facility] to assist in the care of your patient. Our recommendations are noted above.  Please contact us if we can be of further service.   Wells Guiles, M.D., F.C.C.P.  Board Certified in Internal  Medicine, Pulmonary Medicine, Critical Care Medicine, and Sleep Medicine.  [facility] Office Number: [phone]   02/23/2018

## 2018-02-24 ENCOUNTER — Encounter: Payer: Self-pay | Admitting: Pulmonary Disease

## 2018-02-24 ENCOUNTER — Ambulatory Visit: Payer: 59 | Admitting: Internal Medicine

## 2018-02-24 ENCOUNTER — Ambulatory Visit (INDEPENDENT_AMBULATORY_CARE_PROVIDER_SITE_OTHER): Payer: 59 | Admitting: Pulmonary Disease

## 2018-02-24 VITALS — BP 144/89 | HR 67 | Ht 59.0 in | Wt 206.0 lb

## 2018-02-24 DIAGNOSIS — R9389 Abnormal findings on diagnostic imaging of other specified body structures: Secondary | ICD-10-CM | POA: Diagnosis not present

## 2018-02-24 NOTE — Progress Notes (Signed)
Synopsis: Referred in 2019 for dyspnea, abnormal CT chest.  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  She has dealt with recurrent episodes of bronchitis ever since childhood.  She is unclear as to whether or not she was born prematurely.  She developed shortness of breath and cough in 2019.  She underwent lung function testing, CT scanning of the chest and a bronchoscopy in September through October 2019.  CT scanning of the chest showed bilateral cystic change with little progression since a CT scan performed in 2006.  Subjective:   PATIENT ID: [name] GENDER: female DOB: [birth year],  MRN: [id number]   HPI  Chief Complaint  Patient presents with  . Pulm Consult    Former pt of Dr. Nicholos Johns for cough. Patient has a dry cough. She has had this for the past 7 months. When she is on prednisone, the cough will completely go away.    This is a pleasant 62 year old lady who comes to my clinic today for a second opinion visit regarding 7 months of shortness of breath and cough.  She says that the cough has been a new problem for her this year despite having a long history of recurrent episodes of bronchitis ever since childhood.  She says that she has never been told that she has asthma and she is unaware of a family history of lung problems.  She has never smoked cigarettes and she has always worked in a Restaurant manager, fast food.  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  She says that approximately 7 months ago she started noticing a dry cough with associated shortness of breath while exercising.  This has slowly increased in severity since that time and medications used to treat it (various over-the-counter regimens like Mucinex and inhaled medicines such as Symbicort and Trelegy) have not been effective.  She notes that she does not have  gastroesophageal reflux disease.  She has been taking an antiacid and is elevating the head of her bed.  She says that she no longer has postnasal drip now that she has been using an antihistamine and nasal steroids.  To the best of her knowledge she was not born prematurely.  She is unaware of any family history of lung problems.  When she coughs she never produces mucus.  The cough is typically worse when she lay flat or if she is exercising.  Past Medical History:  Diagnosis Date  . Asthma   . Depression   . Diabetes mellitus without complication (HCC)   . Hyperlipidemia   . Kidney stones   . Migraine headache      Family History  Problem Relation Age of Onset  . Hypertension Mother   . Cancer Mother        breast  . Arthritis Mother   . Alzheimer's disease Mother   . Parkinson's disease Mother   . Breast cancer Mother 47  . Hypertension Father   . Diabetes Father   . Kidney cancer Father   . Breast cancer Cousin 35  . Breast cancer Maternal Aunt   . Breast cancer Maternal Grandmother 78     Social History   Socioeconomic History  . Marital status: Legally Separated    Spouse name: Not on file  . Number of children: Not on file  . Years of education: Not on file  . Highest education  level: Not on file  Occupational History  . Not on file  Social Needs  . Financial resource strain: Not on file  . Food insecurity:    Worry: Not on file    Inability: Not on file  . Transportation needs:    Medical: Not on file    Non-medical: Not on file  Tobacco Use  . Smoking status: Never Smoker  . Smokeless tobacco: Never Used  Substance and Sexual Activity  . Alcohol use: Yes  . Drug use: No  . Sexual activity: Not on file  Lifestyle  . Physical activity:    Days per week: Not on file    Minutes per session: Not on file  . Stress: Not on file  Relationships  . Social connections:    Talks on phone: Not on file    Gets together: Not on file    Attends religious  service: Not on file    Active member of club or organization: Not on file    Attends meetings of clubs or organizations: Not on file    Relationship status: Not on file  . Intimate partner violence:    Fear of current or ex partner: Not on file    Emotionally abused: Not on file    Physically abused: Not on file    Forced sexual activity: Not on file  Other Topics Concern  . Not on file  Social History Narrative  . Not on file     Allergies  Allergen Reactions  . Amoxicillin Hives     Outpatient Medications Prior to Visit  Medication Sig Dispense Refill  . albuterol (PROAIR HFA) 108 (90 Base) MCG/ACT inhaler Inhale 2 puffs into the lungs every 6 (six) hours as needed for wheezing or shortness of breath. 8.5 g 11  . buPROPion (WELLBUTRIN SR) 150 MG 12 hr tablet Take 1 tablet (150 mg total) by mouth 3 (three) times daily. 270 tablet 1  . cyclobenzaprine (FLEXERIL) 5 MG tablet TAKE ONE TABLET BY MOUTH 3 TIMES DAILY AS NEEDED FOR MUSCLE SPASM (Patient taking differently: Take 5 mg by mouth 3 (three) times daily as needed for muscle spasms. ) 30 tablet 1  . diphenoxylate-atropine (LOMOTIL) 2.5-0.025 MG tablet TAKE 1 TABLET BY MOUTH 4 TIMES DAILY AS NEEDED FOR DIARRHEA OR LOOSE STOOLS 90 tablet 1  . DULoxetine (CYMBALTA) 30 MG capsule Take 1 capsule (30 mg total) by mouth daily. 7 capsule 0  . fexofenadine-pseudoephedrine (ALLEGRA-D 24) 180-240 MG 24 hr tablet Take 1 tablet by mouth daily.    . fluticasone (FLONASE) 50 MCG/ACT nasal spray Place 2 sprays into both nostrils 2 (two) times daily.    . Fluticasone-Umeclidin-Vilant (TRELEGY ELLIPTA) 100-62.5-25 MCG/INH AEPB Inhale 1 puff into the lungs daily. 90 each 3  . hydrochlorothiazide (HYDRODIURIL) 25 MG tablet Take 1 tablet (25 mg total) by mouth daily. 90 tablet 1  . hyoscyamine (LEVSIN SL) 0.125 MG SL tablet TAKE ONE TABLET BY MOUTH EVERY 4 HOURS AS NEEDED 540 tablet 1  . LORazepam (ATIVAN) 1 MG tablet Take 0.5-1 tablets (0.5-1 mg  total) by mouth daily as needed for anxiety. 30 tablet 1  . montelukast (SINGULAIR) 10 MG tablet Take 1 tablet (10 mg total) by mouth at bedtime. 90 tablet 3  . MYRBETRIQ 50 MG TB24 tablet Take 1 tablet (50 mg total) by mouth daily. 90 tablet 2  . omeprazole (PRILOSEC) 40 MG capsule Take 1 capsule (40 mg total) by mouth daily. 90 capsule 1  . DULoxetine (  CYMBALTA) 60 MG capsule Take 1 capsule (60 mg total) by mouth daily. (Patient not taking: Reported on 02/24/2018) 30 capsule 3  . benzonatate (TESSALON PERLES) 100 MG capsule Take 2 capsules (200 mg total) by mouth 3 (three) times daily. (Patient not taking: Reported on 02/16/2018) 90 capsule 2  . fesoterodine (TOVIAZ) 4 MG TB24 tablet Take 1 tablet (4 mg total) by mouth daily. (Patient not taking: Reported on 02/16/2018) 90 tablet 1  . guaiFENesin-codeine 100-10 MG/5ML syrup Take 5 mLs by mouth every 6 (six) hours as needed for cough. (Patient not taking: Reported on 02/16/2018) 120 mL 0  . ipratropium-albuterol (DUONEB) 0.5-2.5 (3) MG/3ML SOLN Take 3 mLs by nebulization every 6 (six) hours as needed. (Patient not taking: Reported on 02/16/2018) 360 mL 3   No facility-administered medications prior to visit.     Review of Systems  Constitutional: Negative for chills, fever, malaise/fatigue and weight loss.  HENT: Negative for congestion, nosebleeds, sinus pain and sore throat.   Eyes: Negative for photophobia, pain and discharge.  Respiratory: Positive for cough and shortness of breath. Negative for hemoptysis, sputum production and wheezing.   Cardiovascular: Negative for chest pain, palpitations, orthopnea and leg swelling.  Gastrointestinal: Negative for abdominal pain, constipation, diarrhea, nausea and vomiting.  Genitourinary: Negative for dysuria, frequency, hematuria and urgency.  Musculoskeletal: Negative for back pain, joint pain, myalgias and neck pain.  Skin: Negative for itching and rash.  Neurological: Negative for tingling,  tremors, sensory change, speech change, focal weakness, seizures, weakness and headaches.  Psychiatric/Behavioral: Negative for memory loss, substance abuse and suicidal ideas. The patient is not nervous/anxious.       Objective:  Physical Exam   Vitals:   02/24/18 1549  BP: (!) 144/89  Pulse: 67  SpO2: 96%  Weight: 206 lb (93.4 kg)  Height: 4\' 11"  (1.499 m)    RA  Gen: obese, well appearing, no acute distress HENT: NCAT, OP clear, neck supple without masses Eyes: PERRL, EOMi Lymph: no cervical lymphadenopathy PULM: CTA B CV: RRR, no mgr, no JVD GI: BS+, soft, nontender, no hsm Derm: no rash or skin breakdown MSK: normal bulk and tone Neuro: A&Ox4, CN II-XII intact, strength 5/5 in all 4 extremities Psyche: normal mood and affect   CBC    Component Value Date/Time   WBC 6.9 01/18/2018 2216   RBC 4.23 01/18/2018 2216   HGB 13.2 01/18/2018 2216   HGB 13.8 10/07/2017 1641   HCT 39.0 01/18/2018 2216   HCT 43.3 10/07/2017 1641   PLT 250 01/18/2018 2216   PLT 292 10/07/2017 1641   MCV 92.2 01/18/2018 2216   MCV 93 10/07/2017 1641   MCV 92 08/30/2013 0405   MCH 31.1 01/18/2018 2216   MCHC 33.7 01/18/2018 2216   RDW 13.3 01/18/2018 2216   RDW 13.5 10/07/2017 1641   RDW 12.9 08/30/2013 0405   LYMPHSABS 2.2 10/07/2017 1641   EOSABS 0.1 10/07/2017 1641   BASOSABS 0.0 10/07/2017 1641     Chest imaging: September 2019 CT chest images independently reviewed showing diffuse groundglass with patchy air trapping, there are multiple cysts more predominant in the bases of the lungs.  Would compared to images going back to 2017 and even further back in 2006 there is no significant change in what appears to be chronic groundglass and cystic change.  PFT: September 2019 ratio 90%, FVC 1.8 L 67% predicted, total lung capacity 3.33 L 80% predicted, DLCO 22.41 118% predicted  Labs: June 2019 IgE within normal  limits, total eosinophil count 100 August 2019 alpha-1 phenotype  MM, total level 150, August 2019 C-ANCA, P-ANCA both normal October 2019 immunoglobulin level: IgG slightly low at 651, IgM, IgA normal  Path: October 2019 bronchoscopy endobronchial biopsy showed chronic bronchitis changes  Echo:  Heart Catheterization:       Assessment & Plan:   Abnormal CT scan, chest - Plan: Sjogren's syndrome antibods(ssa + ssb), ANA, Hypersensitivity Pneumonitis, HIV Antibody (routine testing w rflx)  Discussion: This is a pleasant 62 year old female who comes to my clinic today for second opinion for 7 months of cough and shortness of breath.  She has had an abnormal CT scan of her chest since 2006, with minimal change in the bilateral air trapping, groundglass, and cysts.  The differential diagnosis here is broad.  I think which most likely is that she has asthma with "a funny looking CT scan" of undetermined significance.  However, the abnormality seen on the CT scan of the chest could be explained by L IP, less likely HIV with PCP, being born with prematurity, or even less likely an eosinophilic granulomatosis.  I have discussed the images of the CT scan of the chest with thoracic radiology today who agrees with this differential diagnosis.  Plan: Asthma: Stop Trelegy Start Symbicort 2 puffs twice a day Use albuterol as needed for chest tightness wheezing or shortness of breath  Cough: Continue gastroesophageal reflux disease treatment with elevating the head your bed and taking antacids Continue taking over-the-counter antihistamines and nasal fluticasone to help with allergic rhinitis You need to try to suppress your cough to allow your larynx (voice box) to heal.  For three days don't talk, laugh, sing, or clear your throat. Do everything you can to suppress the cough during this time. Use hard candies (sugarless Jolly Ranchers) or non-mint or non-menthol containing cough drops during this time to soothe your throat.  Use a cough suppressant (Delsym or  what I have prescribed you) around the clock during this time.  After three days, gradually increase the use of your voice and back off on the cough suppressants.  Ill-defined abnormal CT scan of the chest: As described today it is not clear to me what is causing this I like to get blood work today to see if you have an underlying connective tissue disease or if you are immunosuppressed: We will check an HIV test, an ANA, and SSA/SSB, and hypersensitivity pneumonitis panel  We will see you back in about 2 weeks to go over these results    Current Outpatient Medications:  .  albuterol (PROAIR HFA) 108 (90 Base) MCG/ACT inhaler, Inhale 2 puffs into the lungs every 6 (six) hours as needed for wheezing or shortness of breath., Disp: 8.5 g, Rfl: 11 .  buPROPion (WELLBUTRIN SR) 150 MG 12 hr tablet, Take 1 tablet (150 mg total) by mouth 3 (three) times daily., Disp: 270 tablet, Rfl: 1 .  cyclobenzaprine (FLEXERIL) 5 MG tablet, TAKE ONE TABLET BY MOUTH 3 TIMES DAILY AS NEEDED FOR MUSCLE SPASM (Patient taking differently: Take 5 mg by mouth 3 (three) times daily as needed for muscle spasms. ), Disp: 30 tablet, Rfl: 1 .  diphenoxylate-atropine (LOMOTIL) 2.5-0.025 MG tablet, TAKE 1 TABLET BY MOUTH 4 TIMES DAILY AS NEEDED FOR DIARRHEA OR LOOSE STOOLS, Disp: 90 tablet, Rfl: 1 .  DULoxetine (CYMBALTA) 30 MG capsule, Take 1 capsule (30 mg total) by mouth daily., Disp: 7 capsule, Rfl: 0 .  fexofenadine-pseudoephedrine (ALLEGRA-D 24) 180-240 MG 24 hr  tablet, Take 1 tablet by mouth daily., Disp: , Rfl:  .  fluticasone (FLONASE) 50 MCG/ACT nasal spray, Place 2 sprays into both nostrils 2 (two) times daily., Disp: , Rfl:  .  Fluticasone-Umeclidin-Vilant (TRELEGY ELLIPTA) 100-62.5-25 MCG/INH AEPB, Inhale 1 puff into the lungs daily., Disp: 90 each, Rfl: 3 .  hydrochlorothiazide (HYDRODIURIL) 25 MG tablet, Take 1 tablet (25 mg total) by mouth daily., Disp: 90 tablet, Rfl: 1 .  hyoscyamine (LEVSIN SL) 0.125 MG SL  tablet, TAKE ONE TABLET BY MOUTH EVERY 4 HOURS AS NEEDED, Disp: 540 tablet, Rfl: 1 .  LORazepam (ATIVAN) 1 MG tablet, Take 0.5-1 tablets (0.5-1 mg total) by mouth daily as needed for anxiety., Disp: 30 tablet, Rfl: 1 .  montelukast (SINGULAIR) 10 MG tablet, Take 1 tablet (10 mg total) by mouth at bedtime., Disp: 90 tablet, Rfl: 3 .  MYRBETRIQ 50 MG TB24 tablet, Take 1 tablet (50 mg total) by mouth daily., Disp: 90 tablet, Rfl: 2 .  omeprazole (PRILOSEC) 40 MG capsule, Take 1 capsule (40 mg total) by mouth daily., Disp: 90 capsule, Rfl: 1 .  DULoxetine (CYMBALTA) 60 MG capsule, Take 1 capsule (60 mg total) by mouth daily. (Patient not taking: Reported on 02/24/2018), Disp: 30 capsule, Rfl: 3

## 2018-02-24 NOTE — Patient Instructions (Signed)
Asthma: Stop Trelegy Start Symbicort 2 puffs twice a day Use albuterol as needed for chest tightness wheezing or shortness of breath  Cough: Continue gastroesophageal reflux disease treatment with elevating the head your bed and taking antacids Continue taking over-the-counter antihistamines and nasal fluticasone to help with allergic rhinitis You need to try to suppress your cough to allow your larynx (voice box) to heal.  For three days don't talk, laugh, sing, or clear your throat. Do everything you can to suppress the cough during this time. Use hard candies (sugarless Jolly Ranchers) or non-mint or non-menthol containing cough drops during this time to soothe your throat.  Use a cough suppressant (Delsym or what I have prescribed you) around the clock during this time.  After three days, gradually increase the use of your voice and back off on the cough suppressants.  Ill-defined abnormal CT scan of the chest: As described today it is not clear to me what is causing this I like to get blood work today to see if you have an underlying connective tissue disease or if you are immunosuppressed: We will check an HIV test, an ANA, and SSA/SSB, and hypersensitivity pneumonitis panel  We will see you back in about 2 weeks to go over these results

## 2018-02-25 ENCOUNTER — Other Ambulatory Visit: Payer: 59

## 2018-02-25 DIAGNOSIS — R9389 Abnormal findings on diagnostic imaging of other specified body structures: Secondary | ICD-10-CM

## 2018-03-02 LAB — SJOGREN'S SYNDROME ANTIBODS(SSA + SSB)
ENA SSA (RO) Ab: <0.2 AI (ref 0.0–0.9)
ENA SSB (LA) Ab: <0.2 AI (ref 0.0–0.9)

## 2018-03-02 LAB — HYPERSENSITIVITY PNEUMONITIS
A. PULLULANS ABS: NEGATIVE
A.Fumigatus #1 Abs: NEGATIVE
Micropolyspora faeni, IgG: NEGATIVE
PIGEON SERUM ABS: NEGATIVE
THERMOACT. SACCHARII: NEGATIVE
THERMOACTINOMYCES VULGARIS IGG: NEGATIVE

## 2018-03-02 LAB — ANA: Anti Nuclear Antibody(ANA): NEGATIVE

## 2018-03-02 LAB — HIV ANTIBODY (ROUTINE TESTING W REFLEX): HIV Screen 4th Generation wRfx: NONREACTIVE

## 2018-03-03 NOTE — Telephone Encounter (Signed)
Dr. Kendrick FriesMcQuaid can you review patient's lab results?

## 2018-03-05 NOTE — Telephone Encounter (Signed)
I apologize for not being specific about which results. She is wanting BQ to review the lab results.

## 2018-03-12 ENCOUNTER — Encounter: Payer: Self-pay | Admitting: Family Medicine

## 2018-03-12 ENCOUNTER — Ambulatory Visit (INDEPENDENT_AMBULATORY_CARE_PROVIDER_SITE_OTHER): Payer: 59 | Admitting: Family Medicine

## 2018-03-12 DIAGNOSIS — I10 Essential (primary) hypertension: Secondary | ICD-10-CM

## 2018-03-12 DIAGNOSIS — F3342 Major depressive disorder, recurrent, in full remission: Secondary | ICD-10-CM

## 2018-03-12 NOTE — Assessment & Plan Note (Signed)
Will avoid Cymbalta continue Wellbutrin and patient will schedule appointment for physical exam before Christmas

## 2018-03-12 NOTE — Progress Notes (Signed)
BP (!) 149/85 (BP Location: Right Arm, Patient Position: Sitting, Cuff Size: Large)   Pulse 89   Temp 98.7 F (37.1 C)   Ht 4\' 11"  (1.499 m)   Wt 208 lb (94.3 kg)   SpO2 97%   BMI 42.01 kg/m    Subjective:    Patient ID: [name], female    DOB: Feb 26, 1956, 62 y.o.   MRN: [id number]  HPI: [name] is a 62 y.o. female  Chief Complaint  Patient presents with  . Depression  . Fatigue    severe   Patient was tried on Cymbalta and stopping Wellbutrin patient had visual hallucinations and stopped Cymbalta after 7 days on the 30 mg.  Is back on the Wellbutrin doing well. Large part of patient's stress is relieved with her son doing somewhat better. Patient is working with pulmonary has a new pulmonologist which is doing a work-up for her pulmonary condition which may be helping. His notes were reviewed.  Relevant past medical, surgical, family and social history reviewed and updated as indicated. Interim medical history since our last visit reviewed. Allergies and medications reviewed and updated.  Review of Systems  Constitutional: Negative.   Respiratory: Negative.   Cardiovascular: Negative.     Per HPI unless specifically indicated above     Objective:    BP (!) 149/85 (BP Location: Right Arm, Patient Position: Sitting, Cuff Size: Large)   Pulse 89   Temp 98.7 F (37.1 C)   Ht 4\' 11"  (1.499 m)   Wt 208 lb (94.3 kg)   SpO2 97%   BMI 42.01 kg/m   Wt Readings from Last 3 Encounters:  03/12/18 208 lb (94.3 kg)  02/24/18 206 lb (93.4 kg)  02/16/18 202 lb (91.6 kg)    Physical Exam  Constitutional: She is oriented to person, place, and time. She appears well-developed and well-nourished.  HENT:  Head: Normocephalic and atraumatic.  Eyes: Conjunctivae and EOM are normal.  Neck: Normal range of motion.  Cardiovascular: Normal rate, regular rhythm and normal heart sounds.  Pulmonary/Chest: Effort normal and breath sounds normal.    Musculoskeletal: Normal range of motion.  Neurological: She is alert and oriented to person, place, and time.  Skin: No erythema.  Psychiatric: She has a normal mood and affect. Her behavior is normal. Judgment and thought content normal.    Results for orders placed or performed in visit on 02/25/18  HIV Antibody (routine testing w rflx)  Result Value Ref Range   HIV Screen 4th Generation wRfx Non Reactive Non Reactive  Hypersensitivity Pneumonitis  Result Value Ref Range   A.Fumigatus #1 Abs Negative Negative   Micropolyspora faeni, IgG Negative Negative   Thermoactinomyces vulgaris, IgG Negative Negative   A. Pullulans Abs Negative Negative   Thermoact. Saccharii Negative Negative   Pigeon Serum Abs Negative Negative  ANA  Result Value Ref Range   Anti Nuclear Antibody(ANA) Negative Negative  Sjogren's syndrome antibods(ssa + ssb)  Result Value Ref Range   ENA SSA (RO) Ab <0.2 0.0 - 0.9 AI   ENA SSB (LA) Ab <0.2 0.0 - 0.9 AI      Assessment & Plan:   Problem List Items Addressed This Visit      Cardiovascular and Mediastinum   Essential hypertension    The current medical regimen is effective;  continue present plan and medications.         Other   Depression    Will avoid Cymbalta continue Wellbutrin and  patient will schedule appointment for physical exam before Christmas          Follow up plan: Return in about 4 weeks (around 04/09/2018) for Physical Exam.

## 2018-03-12 NOTE — Assessment & Plan Note (Signed)
The current medical regimen is effective;  continue present plan and medications.  

## 2018-03-17 ENCOUNTER — Telehealth: Payer: Self-pay | Admitting: Pulmonary Disease

## 2018-03-17 ENCOUNTER — Encounter: Payer: Self-pay | Admitting: Family Medicine

## 2018-03-17 NOTE — Telephone Encounter (Signed)
Patient states  the cough is worse and have fever and extreme fatigue. I have set her up with apt for tomorrow.

## 2018-03-17 NOTE — Telephone Encounter (Signed)
Received the below mychart message from pt:  Hey just wanted to follow up with the blood work thatwe were waiting on at the last time we spoke. I am getting the cough back, and some fever and extremely tired again and need to know what to do for moving forward. Any medicine change or anything?Please. Thanks!  Called and spoke with pt to get more data on her symptoms but  Unable to speak to her. Left message for pt to return call.

## 2018-03-18 ENCOUNTER — Encounter: Payer: Self-pay | Admitting: Nurse Practitioner

## 2018-03-18 ENCOUNTER — Ambulatory Visit: Payer: 59 | Admitting: Nurse Practitioner

## 2018-03-18 ENCOUNTER — Other Ambulatory Visit: Payer: Self-pay | Admitting: Family Medicine

## 2018-03-18 VITALS — BP 128/80 | HR 69 | Temp 98.3°F | Ht 59.0 in | Wt 208.4 lb

## 2018-03-18 DIAGNOSIS — J209 Acute bronchitis, unspecified: Secondary | ICD-10-CM | POA: Diagnosis not present

## 2018-03-18 MED ORDER — PROMETHAZINE-DM 6.25-15 MG/5ML PO SYRP: 5.0000 mL | ORAL_SOLUTION | Freq: Every evening | ORAL | 0 refills | Status: DC | PRN

## 2018-03-18 MED ORDER — METHYLPREDNISOLONE ACETATE 80 MG/ML IJ SUSP
80.0000 mg | Freq: Once | INTRAMUSCULAR | Status: AC
  Administered 2018-03-18: 80 mg via INTRAMUSCULAR

## 2018-03-18 MED ORDER — PREDNISONE 10 MG PO TABS: ORAL_TABLET | ORAL | 0 refills | Status: DC

## 2018-03-18 MED ORDER — AZITHROMYCIN 250 MG PO TABS: ORAL_TABLET | ORAL | 0 refills | Status: DC

## 2018-03-18 NOTE — Assessment & Plan Note (Signed)
Acute on chronic bronchitis with underlying sinusitis  Patient Instructions  DepoMedrol given in office today Will order azithromycin Will order prednisone taper to start tomorrow Will order promethazine DM for cough May take mucinex  General instructions: Hydrate  Drink enough water to keep your pee (urine) clear or pale yellow. Rest  Rest as much as possible.  Sleep with your head raised (elevated).  Make sure to get enough sleep each night. General instructions  Put a warm, moist washcloth on your face 3-4 times a day or as told by your doctor. This will help with discomfort.  Wash your hands often with soap and water. If there is no soap and water, use hand sanitizer.  Do not smoke. Avoid being around people who are smoking (secondhand smoke). Call the office if:  You have a fever.  Your symptoms get worse.  Your symptoms do not get better within 10 days.  Follow up with Dr. Kendrick FriesMcQuaid as scheduled or sooner if needed

## 2018-03-18 NOTE — Progress Notes (Signed)
Reviewed, agree 

## 2018-03-18 NOTE — Progress Notes (Signed)
@Patient  ID: [name], female    DOB: 11-10-1955, 62 y.o.   MRN: [id number]  Chief Complaint  Patient presents with  . Cough    Referring provider: Steele Sizer, MD  HPI 62 year old female with history of chronic bronchitis and asthma followed by Dr. Kendrick Fries.  Tests:  Chest imaging: September 2019 CT chest images independently reviewed showing diffuse groundglass with patchy air trapping, there are multiple cysts more predominant in the bases of the lungs.  Would compared to images going back to 2017 and even further back in 2006 there is no significant change in what appears to be chronic groundglass and cystic change.  PFT: September 2019 ratio 90%, FVC 1.8 L 67% predicted, total lung capacity 3.33 L 80% predicted, DLCO 22.41 118% predicted  Labs: June 2019 IgE within normal limits, total eosinophil count 100 August 2019 alpha-1 phenotype MM, total level 150, August 2019 C-ANCA, P-ANCA both normal October 2019 immunoglobulin level: IgG slightly low at 651, IgM, IgA normal  Path: October 2019 bronchoscopy endobronchial biopsy showed chronic bronchitis changes   OV 03/18/18 - acute Cough and sinus congestion Patient presents with cough and sinus congestion. She states that symptoms started 3 days ago. States that symptoms are progressively worsening. Cough is nonproductive. She is having sinus pressure and pain with postnasal drip. She was switched to Symbicort at last visit and states that she is compliant with it. She denies any shortness of breath, fever, or edema.     Allergies  Allergen Reactions  . Cymbalta [Duloxetine Hcl] Other (See Comments)    Hallucinations  . Amoxicillin Hives    Immunization History  Administered Date(s) Administered  . Hep A / Hep B 07/14/2014, 08/31/2014  . Pneumococcal-Unspecified 04/07/2007  . Tdap 04/19/2010, 07/15/2015    Past Medical History:  Diagnosis Date  . Asthma   . Depression   . Diabetes mellitus  without complication (HCC)   . Hyperlipidemia   . Kidney stones   . Migraine headache     Tobacco History: Social History   Tobacco Use  Smoking Status Never Smoker  Smokeless Tobacco Never Used   Counseling given: Yes   Outpatient Encounter Medications as of 03/18/2018  Medication Sig  . albuterol (PROAIR HFA) 108 (90 Base) MCG/ACT inhaler Inhale 2 puffs into the lungs every 6 (six) hours as needed for wheezing or shortness of breath.  . budesonide-formoterol (SYMBICORT) 160-4.5 MCG/ACT inhaler Inhale 2 puffs into the lungs 2 (two) times daily.  Marland Kitchen buPROPion (WELLBUTRIN SR) 150 MG 12 hr tablet Take 1 tablet (150 mg total) by mouth 3 (three) times daily.  . cyclobenzaprine (FLEXERIL) 5 MG tablet TAKE ONE TABLET BY MOUTH 3 TIMES DAILY AS NEEDED FOR MUSCLE SPASM (Patient taking differently: Take 5 mg by mouth 3 (three) times daily as needed for muscle spasms. )  . diphenoxylate-atropine (LOMOTIL) 2.5-0.025 MG tablet TAKE 1 TABLET BY MOUTH 4 TIMES DAILY AS NEEDED FOR DIARRHEA OR LOOSE STOOLS  . fexofenadine-pseudoephedrine (ALLEGRA-D 24) 180-240 MG 24 hr tablet Take 1 tablet by mouth daily.  . fluticasone (FLONASE) 50 MCG/ACT nasal spray Place 2 sprays into both nostrils 2 (two) times daily.  . hydrochlorothiazide (HYDRODIURIL) 25 MG tablet Take 1 tablet (25 mg total) by mouth daily.  . hyoscyamine (LEVSIN SL) 0.125 MG SL tablet TAKE ONE TABLET BY MOUTH EVERY 4 HOURS AS NEEDED  . LORazepam (ATIVAN) 1 MG tablet Take 0.5-1 tablets (0.5-1 mg total) by mouth daily as needed for anxiety.  Marland Kitchen  montelukast (SINGULAIR) 10 MG tablet Take 1 tablet (10 mg total) by mouth at bedtime.  Marland Kitchen MYRBETRIQ 50 MG TB24 tablet Take 1 tablet (50 mg total) by mouth daily.  Marland Kitchen omeprazole (PRILOSEC) 40 MG capsule Take 1 capsule (40 mg total) by mouth daily.  Marland Kitchen azithromycin (ZITHROMAX) 250 MG tablet Take 2 tablets (500 mg) on day 1, then take 1 tablet (250 mg) on days 2-5  . predniSONE (DELTASONE) 10 MG tablet Take 4  tabs for 2 days, then 3 tabs for 2 days, then 2 tabs for 2 days, then 1 tab for 2 days, then stop  . promethazine-dextromethorphan (PROMETHAZINE-DM) 6.25-15 MG/5ML syrup Take 5 mLs by mouth at bedtime as needed for cough (as needed for cough).  . [DISCONTINUED] Fluticasone-Umeclidin-Vilant (TRELEGY ELLIPTA) 100-62.5-25 MCG/INH AEPB Inhale 1 puff into the lungs daily. (Patient not taking: Reported on 03/18/2018)  . [EXPIRED] methylPREDNISolone acetate (DEPO-MEDROL) injection 80 mg    No facility-administered encounter medications on file as of 03/18/2018.      Review of Systems  Review of Systems  Constitutional: Negative.  Negative for chills and fever.  HENT: Positive for congestion, postnasal drip, sinus pressure and sinus pain.   Respiratory: Positive for cough. Negative for shortness of breath and wheezing.   Cardiovascular: Negative.  Negative for chest pain, palpitations and leg swelling.  Gastrointestinal: Negative.   Allergic/Immunologic: Negative.   Neurological: Negative.   Psychiatric/Behavioral: Negative.        Physical Exam  BP 128/80 (BP Location: Left Arm, Patient Position: Sitting, Cuff Size: Normal)   Pulse 69   Temp 98.3 F (36.8 C)   Ht 4\' 11"  (1.499 m)   Wt 208 lb 6.4 oz (94.5 kg)   SpO2 99%   BMI 42.09 kg/m   Wt Readings from Last 5 Encounters:  03/18/18 208 lb 6.4 oz (94.5 kg)  03/12/18 208 lb (94.3 kg)  02/24/18 206 lb (93.4 kg)  02/16/18 202 lb (91.6 kg)  01/26/18 209 lb (94.8 kg)     Physical Exam  Constitutional: She is oriented to person, place, and time. She appears well-developed and well-nourished. No distress.  HENT:  Nose: Right sinus exhibits maxillary sinus tenderness and frontal sinus tenderness. Left sinus exhibits maxillary sinus tenderness and frontal sinus tenderness.  Cardiovascular: Normal rate and regular rhythm.  Pulmonary/Chest: Effort normal and breath sounds normal. No respiratory distress. She has no wheezes.    Musculoskeletal: She exhibits no edema.  Neurological: She is alert and oriented to person, place, and time.  Psychiatric: She has a normal mood and affect.  Nursing note and vitals reviewed.     Assessment & Plan:   Acute bronchitis, unspecified Acute on chronic bronchitis with underlying sinusitis  Patient Instructions  DepoMedrol given in office today Will order azithromycin Will order prednisone taper to start tomorrow Will order promethazine DM for cough May take mucinex  General instructions: Hydrate  Drink enough water to keep your pee (urine) clear or pale yellow. Rest  Rest as much as possible.  Sleep with your head raised (elevated).  Make sure to get enough sleep each night. General instructions  Put a warm, moist washcloth on your face 3-4 times a day or as told by your doctor. This will help with discomfort.  Wash your hands often with soap and water. If there is no soap and water, use hand sanitizer.  Do not smoke. Avoid being around people who are smoking (secondhand smoke). Call the office if:  You have a fever.  Your symptoms get worse.  Your symptoms do not get better within 10 days.  Follow up with Dr. Kendrick FriesMcQuaid as scheduled or sooner if needed        Ivonne Andrewonya S Janeya Deyo, NP 03/18/2018

## 2018-03-18 NOTE — Patient Instructions (Addendum)
DepoMedrol given in office today Will order azithromycin Will order prednisone taper to start tomorrow Will order promethazine DM for cough May take mucinex  General instructions: Hydrate  Drink enough water to keep your pee (urine) clear or pale yellow. Rest  Rest as much as possible.  Sleep with your head raised (elevated).  Make sure to get enough sleep each night. General instructions  Put a warm, moist washcloth on your face 3-4 times a day or as told by your doctor. This will help with discomfort.  Wash your hands often with soap and water. If there is no soap and water, use hand sanitizer.  Do not smoke. Avoid being around people who are smoking (secondhand smoke). Call the office if:  You have a fever.  Your symptoms get worse.  Your symptoms do not get better within 10 days.  Follow up with [name] as scheduled or sooner if needed

## 2018-04-03 NOTE — Telephone Encounter (Signed)
BQ please advise, patient is requesting results from blood work done on 02/25/18 thank you.

## 2018-04-07 ENCOUNTER — Ambulatory Visit: Payer: 59 | Admitting: Family Medicine

## 2018-04-07 ENCOUNTER — Encounter: Payer: Self-pay | Admitting: Family Medicine

## 2018-04-07 VITALS — BP 129/93 | HR 90 | Temp 98.1°F | Ht <= 58 in | Wt 205.0 lb

## 2018-04-07 DIAGNOSIS — F419 Anxiety disorder, unspecified: Secondary | ICD-10-CM | POA: Diagnosis not present

## 2018-04-07 DIAGNOSIS — E119 Type 2 diabetes mellitus without complications: Secondary | ICD-10-CM

## 2018-04-07 DIAGNOSIS — E1169 Type 2 diabetes mellitus with other specified complication: Secondary | ICD-10-CM | POA: Insufficient documentation

## 2018-04-07 DIAGNOSIS — J42 Unspecified chronic bronchitis: Secondary | ICD-10-CM | POA: Diagnosis not present

## 2018-04-07 DIAGNOSIS — E785 Hyperlipidemia, unspecified: Secondary | ICD-10-CM

## 2018-04-07 DIAGNOSIS — Z23 Encounter for immunization: Secondary | ICD-10-CM | POA: Diagnosis not present

## 2018-04-07 DIAGNOSIS — I10 Essential (primary) hypertension: Secondary | ICD-10-CM

## 2018-04-07 DIAGNOSIS — E7800 Pure hypercholesterolemia, unspecified: Secondary | ICD-10-CM

## 2018-04-07 LAB — LP+ALT+AST PICCOLO, WAIVED
ALT (SGPT) Piccolo, Waived: 30 U/L (ref 10–47)
AST (SGOT) Piccolo, Waived: 16 U/L (ref 11–38)
Chol/HDL Ratio Piccolo,Waive: 2.1 mg/dL
Cholesterol Piccolo, Waived: 186 mg/dL (ref <200)
HDL CHOL PICCOLO, WAIVED: 89 mg/dL (ref >59)
LDL Chol Calc Piccolo Waived: 75 mg/dL (ref <100)
Triglycerides Piccolo,Waived: 110 mg/dL (ref <150)
VLDL Chol Calc Piccolo,Waive: 22 mg/dL (ref <30)

## 2018-04-07 LAB — BAYER DCA HB A1C WAIVED: HB A1C (BAYER DCA - WAIVED): 5.9 % (ref <7.0)

## 2018-04-07 MED ORDER — LORAZEPAM 1 MG PO TABS: 0.5000 mg | ORAL_TABLET | Freq: Every day | ORAL | 5 refills | Status: DC | PRN

## 2018-04-07 MED ORDER — ATORVASTATIN CALCIUM 10 MG PO TABS: 10.0000 mg | ORAL_TABLET | Freq: Every day | ORAL | 3 refills | Status: DC

## 2018-04-07 NOTE — Assessment & Plan Note (Signed)
The current medical regimen is effective;  continue present plan and medications.  

## 2018-04-07 NOTE — Assessment & Plan Note (Signed)
Discuss lipids and use of medications patient cholesterol low but with diabetes will give prescription for atorvastatin 10 mg

## 2018-04-07 NOTE — Progress Notes (Signed)
BP (!) 129/93   Pulse 90   Temp 98.1 F (36.7 C) (Oral)   Ht 4' 9.8" (1.468 m)   Wt 205 lb (93 kg)   SpO2 96%   BMI 43.14 kg/m    Subjective:    Patient ID: [name], female    DOB: 1955-07-04, 62 y.o.   MRN: [id number]  HPI: [name] is a 62 y.o. female  Patient follow-up chronic cough which is gotten significantly better with taking lorazepam 0.5 a day with sometimes taking an extra half a tablet.  Patient has continued other inhalers. Is handling stress significantly better also. Diabetes doing well with medication no complaints Blood pressure doing well. Discussed cholesterol.  Relevant past medical, surgical, family and social history reviewed and updated as indicated. Interim medical history since our last visit reviewed. Allergies and medications reviewed and updated.  Review of Systems  Constitutional: Negative.   Respiratory: Negative.   Cardiovascular: Negative.     Per HPI unless specifically indicated above     Objective:    BP (!) 129/93   Pulse 90   Temp 98.1 F (36.7 C) (Oral)   Ht 4' 9.8" (1.468 m)   Wt 205 lb (93 kg)   SpO2 96%   BMI 43.14 kg/m   Wt Readings from Last 3 Encounters:  04/07/18 205 lb (93 kg)  03/18/18 208 lb 6.4 oz (94.5 kg)  03/12/18 208 lb (94.3 kg)    Physical Exam Constitutional:      Appearance: She is well-developed.  HENT:     Head: Normocephalic and atraumatic.  Eyes:     Conjunctiva/sclera: Conjunctivae normal.  Neck:     Musculoskeletal: Normal range of motion.  Cardiovascular:     Rate and Rhythm: Normal rate and regular rhythm.     Heart sounds: Normal heart sounds.  Pulmonary:     Effort: Pulmonary effort is normal.     Breath sounds: Normal breath sounds.  Musculoskeletal: Normal range of motion.  Skin:    Findings: No erythema.  Neurological:     Mental Status: She is alert and oriented to person, place, and time.  Psychiatric:        Behavior: Behavior normal.        Thought  Content: Thought content normal.        Judgment: Judgment normal.     Results for orders placed or performed in visit on 02/25/18  HIV Antibody (routine testing w rflx)  Result Value Ref Range   HIV Screen 4th Generation wRfx Non Reactive Non Reactive  Hypersensitivity Pneumonitis  Result Value Ref Range   A.Fumigatus #1 Abs Negative Negative   Micropolyspora faeni, IgG Negative Negative   Thermoactinomyces vulgaris, IgG Negative Negative   A. Pullulans Abs Negative Negative   Thermoact. Saccharii Negative Negative   Pigeon Serum Abs Negative Negative  ANA  Result Value Ref Range   Anti Nuclear Antibody(ANA) Negative Negative  Sjogren's syndrome antibods(ssa + ssb)  Result Value Ref Range   ENA SSA (RO) Ab <0.2 0.0 - 0.9 AI   ENA SSB (LA) Ab <0.2 0.0 - 0.9 AI      Assessment & Plan:   Problem List Items Addressed This Visit      Cardiovascular and Mediastinum   Essential hypertension    The current medical regimen is effective;  continue present plan and medications.       Relevant Medications   atorvastatin (LIPITOR) 10 MG tablet     Respiratory  Chronic bronchitis (HCC)    With chronic cough significantly better with lorazepam        Endocrine   Diabetes mellitus without complication (HCC) - Primary    The current medical regimen is effective;  continue present plan and medications.       Relevant Medications   atorvastatin (LIPITOR) 10 MG tablet   Other Relevant Orders   Bayer DCA Hb A1c Waived   Basic metabolic panel   LP+ALT+AST Piccolo, Waived   Pneumococcal polysaccharide vaccine 23-valent greater than or equal to 2yo subcutaneous/IM (Completed)     Other   Anxiety    Discussed anxiety with patient's chronic conditions will continue lorazepam 1 mg #30 with 5 refills patient will continue using half a tablet a day with an extra half as needed.      Relevant Medications   LORazepam (ATIVAN) 1 MG tablet   Hyperlipidemia    Discuss lipids and  use of medications patient cholesterol low but with diabetes will give prescription for atorvastatin 10 mg      Relevant Medications   atorvastatin (LIPITOR) 10 MG tablet       Follow up plan: Return in about 6 months (around 10/07/2018) for Physical Exam, Hemoglobin A1c.

## 2018-04-07 NOTE — Assessment & Plan Note (Signed)
With chronic cough significantly better with lorazepam

## 2018-04-07 NOTE — Patient Instructions (Signed)

## 2018-04-07 NOTE — Assessment & Plan Note (Signed)
Discussed anxiety with patient's chronic conditions will continue lorazepam 1 mg #30 with 5 refills patient will continue using half a tablet a day with an extra half as needed.

## 2018-04-08 ENCOUNTER — Encounter: Payer: Self-pay | Admitting: Family Medicine

## 2018-04-08 LAB — BASIC METABOLIC PANEL
BUN/Creatinine Ratio: 16 (ref 12–28)
BUN: 12 mg/dL (ref 8–27)
CALCIUM: 9.3 mg/dL (ref 8.7–10.3)
CO2: 27 mmol/L (ref 20–29)
Chloride: 101 mmol/L (ref 96–106)
Creatinine, Ser: 0.73 mg/dL (ref 0.57–1.00)
GFR calc Af Amer: 101 mL/min/{1.73_m2} (ref >59)
GFR calc non Af Amer: 88 mL/min/{1.73_m2} (ref >59)
Glucose: 83 mg/dL (ref 65–99)
POTASSIUM: 3.9 mmol/L (ref 3.5–5.2)
Sodium: 140 mmol/L (ref 134–144)

## 2018-04-21 LAB — ACID FAST SMEAR (AFB, MYCOBACTERIA)

## 2018-04-21 LAB — ACID FAST SMEAR (AFB): ACID FAST SMEAR - AFSCU2: NEGATIVE

## 2018-04-21 LAB — ACID FAST CULTURE WITH REFLEXED SENSITIVITIES (MYCOBACTERIA): Acid Fast Culture: NEGATIVE

## 2018-04-21 LAB — ACID FAST CULTURE WITH REFLEXED SENSITIVITIES

## 2018-05-26 ENCOUNTER — Encounter: Payer: Self-pay | Admitting: Pulmonary Disease

## 2018-05-26 ENCOUNTER — Ambulatory Visit: Payer: 59 | Admitting: Pulmonary Disease

## 2018-05-26 VITALS — BP 110/82 | HR 82 | Temp 98.3°F | Ht 60.0 in | Wt 205.4 lb

## 2018-05-26 DIAGNOSIS — J069 Acute upper respiratory infection, unspecified: Secondary | ICD-10-CM

## 2018-05-26 DIAGNOSIS — J4530 Mild persistent asthma, uncomplicated: Secondary | ICD-10-CM

## 2018-05-26 DIAGNOSIS — Z6841 Body Mass Index (BMI) 40.0 and over, adult: Secondary | ICD-10-CM

## 2018-05-26 LAB — POCT INFLUENZA A/B
INFLUENZA B, POC: NEGATIVE
Influenza A, POC: NEGATIVE

## 2018-05-26 MED ORDER — IBUPROFEN 600 MG PO TABS: 600.0000 mg | ORAL_TABLET | Freq: Four times a day (QID) | ORAL | 0 refills | Status: DC | PRN

## 2018-05-26 NOTE — Progress Notes (Signed)
 @Patient  ID: [name], female    DOB: May 21, 1955, 63 y.o.   MRN: [id number]  Chief Complaint  Patient presents with  . Acute Visit    Cough, flu like symptoms     Referring provider: Steele Sizerrissman, Mark A, MD  HPI:  63 year old female never smoker followed in our office for asthma, chronic bronchitis  PMH: Hyperlipidemia, diabetes, morbid obesity, anxiety Smoker/ Smoking History: Never smoker Maintenance: Symbicort 160 Pt of: Dr. Kendrick FriesMcQuaid  Synopsis: Referred in 2019 for dyspnea, abnormal CT chest.  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  She has dealt with recurrent episodes of bronchitis ever since childhood.  She is unclear as to whether or not she was born prematurely.  She developed shortness of breath and cough in 2019.  She underwent lung function testing, CT scanning of the chest and a bronchoscopy in September through October 2019.  CT scanning of the chest showed bilateral cystic change with little progression since a CT scan performed in 2006.   05/26/2018  - Visit   63 year old female never smoker presenting to our office today with acute symptoms of 1 day of shortness of breath, cough, sore throat, runny nose, body aches and chills, fever (T-max 101 today).  Patient is afebrile at this office visit but has recently taken Tylenol.  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  Patient did not get a flu vaccine this year, patient reports that it makes her sick.  Patient remains adherent to her Allegra, Singulair, Symbicort 160.   Patient reports she has used her albuterol rescue inhaler 3 times a day which has helped relieve symptoms.    Tests:  Chest imaging: September 2019 CT chest  showing diffuse groundglass with patchy air trapping, there are multiple cysts more predominant in the bases of the lungs.   >>>Would compared to images going  back to 2017 and even further back in 2006 there is no significant change in what appears to be chronic groundglass and cystic change.  PFT: September 2019 ratio 90%, FVC 1.8 L 67% predicted, total lung capacity 3.33 L 80% predicted, DLCO 22.41 118% predicted  Labs: June 2019 IgE within normal limits, total eosinophil count 100 August 2019 alpha-1 phenotype MM, total level 150, August 2019 C-ANCA, P-ANCA both normal October 2019 immunoglobulin level: IgG slightly low at 651, IgM, IgA normal  Path: October 2019 bronchoscopy endobronchial biopsy showed chronic bronchitis changes  Echo:  Heart Catheterization:  FENO:  No results found for: NITRICOXIDE  PFT: No flowsheet data found.  Imaging: No results found.    Specialty Problems      Pulmonary Problems   Asthma    Asthma flare with chronic shortness of breath and history of chronic sinusitis We'll give neb treatment today and pulmonary referral      Chronic bronchitis (HCC)   Acute bronchitis, unspecified   Acute upper respiratory infection      Allergies  Allergen Reactions  . Cymbalta [Duloxetine Hcl] Other (See Comments)    Hallucinations  . Amoxicillin Hives    Immunization History  Administered Date(s) Administered  . Hep A / Hep B 07/14/2014, 08/31/2014  . Pneumococcal Polysaccharide-23 04/07/2018  . Pneumococcal-Unspecified 04/07/2007  . Tdap 04/19/2010, 07/15/2015   No flu vaccine   Past Medical History:  Diagnosis Date  . Asthma   . Depression   .  Diabetes mellitus without complication (HCC)   . Hyperlipidemia   . Kidney stones   . Migraine headache     Tobacco History: Social History   Tobacco Use  Smoking Status Never Smoker  Smokeless Tobacco Never Used   Counseling given: Yes  Continue to not smoke  Outpatient Encounter Medications as of 05/26/2018  Medication Sig  . albuterol (PROAIR HFA) 108 (90 Base) MCG/ACT inhaler Inhale 2 puffs into the lungs every 6 (six) hours as  needed for wheezing or shortness of breath.  . budesonide-formoterol (SYMBICORT) 160-4.5 MCG/ACT inhaler Inhale 2 puffs into the lungs 2 (two) times daily.  Marland Kitchen buPROPion (WELLBUTRIN SR) 150 MG 12 hr tablet Take 1 tablet (150 mg total) by mouth 3 (three) times daily.  . cyclobenzaprine (FLEXERIL) 5 MG tablet TAKE ONE TABLET BY MOUTH 3 TIMES DAILY AS NEEDED FOR MUSCLE SPASM (Patient taking differently: Take 5 mg by mouth 3 (three) times daily as needed for muscle spasms. )  . diphenoxylate-atropine (LOMOTIL) 2.5-0.025 MG tablet TAKE 1 TABLET BY MOUTH 4 TIMES DAILY AS NEEDED FOR DIARRHEA OR LOOSE STOOLS  . fexofenadine-pseudoephedrine (ALLEGRA-D 24) 180-240 MG 24 hr tablet Take 1 tablet by mouth daily.  . fluticasone (FLONASE) 50 MCG/ACT nasal spray Place 2 sprays into both nostrils 2 (two) times daily.  . hydrochlorothiazide (HYDRODIURIL) 25 MG tablet Take 1 tablet (25 mg total) by mouth daily.  . hyoscyamine (LEVSIN SL) 0.125 MG SL tablet TAKE ONE TABLET BY MOUTH EVERY 4 HOURS AS NEEDED  . ibuprofen (IBU) 600 MG tablet Take 1 tablet (600 mg total) by mouth every 6 (six) hours as needed.  Marland Kitchen LORazepam (ATIVAN) 1 MG tablet Take 0.5-1 tablets (0.5-1 mg total) by mouth daily as needed for anxiety.  . montelukast (SINGULAIR) 10 MG tablet Take 1 tablet (10 mg total) by mouth at bedtime.  Marland Kitchen MYRBETRIQ 50 MG TB24 tablet Take 1 tablet (50 mg total) by mouth daily.  Marland Kitchen omeprazole (PRILOSEC) 40 MG capsule Take 1 capsule (40 mg total) by mouth daily.  . promethazine-dextromethorphan (PROMETHAZINE-DM) 6.25-15 MG/5ML syrup Take 5 mLs by mouth at bedtime as needed for cough (as needed for cough). (Patient not taking: Reported on 04/07/2018)  . [DISCONTINUED] atorvastatin (LIPITOR) 10 MG tablet Take 1 tablet (10 mg total) by mouth daily. (Patient not taking: Reported on 05/26/2018)   No facility-administered encounter medications on file as of 05/26/2018.      Review of Systems  Review of Systems  Constitutional:  Positive for chills, fatigue and fever (Tmax - 100).  HENT: Positive for congestion and sore throat. Negative for sinus pressure and sinus pain.   Respiratory: Positive for cough (dry cough ) and shortness of breath.   Cardiovascular: Negative for chest pain and palpitations.  Gastrointestinal: Negative for diarrhea, nausea and vomiting.  Neurological: Positive for headaches.     Physical Exam  BP 110/82 (BP Location: Left Arm, Cuff Size: Normal)   Pulse 82   Temp 98.3 F (36.8 C) (Oral)   Ht 5' (1.524 m)   Wt 205 lb 6.4 oz (93.2 kg)   SpO2 99%   BMI 40.11 kg/m   Wt Readings from Last 5 Encounters:  05/26/18 205 lb 6.4 oz (93.2 kg)  04/07/18 205 lb (93 kg)  03/18/18 208 lb 6.4 oz (94.5 kg)  03/12/18 208 lb (94.3 kg)  02/24/18 206 lb (93.4 kg)    Physical Exam  Constitutional: She is oriented to person, place, and time and well-developed, well-nourished, and in no distress.  No distress.  HENT:  Head: Normocephalic and atraumatic.  Right Ear: Hearing, external ear and ear canal normal.  Left Ear: Hearing, external ear and ear canal normal.  Nose: Mucosal edema present. Right sinus exhibits no maxillary sinus tenderness and no frontal sinus tenderness. Left sinus exhibits no maxillary sinus tenderness and no frontal sinus tenderness.  Mouth/Throat: Uvula is midline. No oropharyngeal exudate.  +TMs with effusion without infection bilaterally  +mallampati 4   Eyes: Pupils are equal, round, and reactive to light.  Neck: Normal range of motion. Neck supple.  Cardiovascular: Normal rate, regular rhythm and normal heart sounds.  Pulmonary/Chest: Effort normal and breath sounds normal. No accessory muscle usage. No respiratory distress. She has no decreased breath sounds. She has no wheezes. She has no rhonchi. She has no rales.  Abdominal: Soft. Bowel sounds are normal. There is no abdominal tenderness.  Musculoskeletal: Normal range of motion.  Lymphadenopathy:       Head (right  side): Tonsillar adenopathy present.       Head (left side): Tonsillar adenopathy present.    She has no cervical adenopathy.  Neurological: She is alert and oriented to person, place, and time. Gait normal.  Skin: Skin is warm and dry. She is not diaphoretic. No erythema.  Psychiatric: Mood, memory and judgment normal. She has a flat affect.  Nursing note and vitals reviewed.     Lab Results:  CBC    Component Value Date/Time   WBC 6.9 01/18/2018 2216   RBC 4.23 01/18/2018 2216   HGB 13.2 01/18/2018 2216   HGB 13.8 10/07/2017 1641   HCT 39.0 01/18/2018 2216   HCT 43.3 10/07/2017 1641   PLT 250 01/18/2018 2216   PLT 292 10/07/2017 1641   MCV 92.2 01/18/2018 2216   MCV 93 10/07/2017 1641   MCV 92 08/30/2013 0405   MCH 31.1 01/18/2018 2216   MCHC 33.7 01/18/2018 2216   RDW 13.3 01/18/2018 2216   RDW 13.5 10/07/2017 1641   RDW 12.9 08/30/2013 0405   LYMPHSABS 2.2 10/07/2017 1641   EOSABS 0.1 10/07/2017 1641   BASOSABS 0.0 10/07/2017 1641    BMET    Component Value Date/Time   NA 140 04/07/2018 1138   NA 138 08/30/2013 0405   K 3.9 04/07/2018 1138   K 3.6 08/30/2013 0405   CL 101 04/07/2018 1138   CL 103 08/30/2013 0405   CO2 27 04/07/2018 1138   CO2 32 08/30/2013 0405   GLUCOSE 83 04/07/2018 1138   GLUCOSE 139 (H) 01/18/2018 2216   GLUCOSE 113 (H) 08/30/2013 0405   BUN 12 04/07/2018 1138   BUN 12 08/30/2013 0405   CREATININE 0.73 04/07/2018 1138   CREATININE 0.64 08/30/2013 0405   CALCIUM 9.3 04/07/2018 1138   CALCIUM 9.1 08/30/2013 0405   GFRNONAA 88 04/07/2018 1138   GFRNONAA >60 08/30/2013 0405   GFRAA 101 04/07/2018 1138   GFRAA >60 08/30/2013 0405    BNP No results found for: BNP  ProBNP No results found for: PROBNP    Assessment & Plan:     Acute upper respiratory infection Assessment: Fever Chills Body aches Cough, sore throat  Plan: In office flu swab negative Discussed with patient and she agrees for respiratory virus panel to  be swab Treat his upper respiratory infection at this time We will prescribe Tamiflu if patient is positive for flu and respiratory virus panel  Asthma Assessment: Probable upper respiratory infection Lungs clear to auscultation today  Plan: Continue Allegra daily  Continue Singulair daily Continue Symbicort 160 2 puffs twice daily Follow-up with our office in 3 to 6 months  BMI 40.0-44.9, adult Assessment: BMI 40.1 Mallampati 4  Plan: Actively work on losing weight Could consider referral to medical weight management at next office visit Consider screening for obstructive sleep apnea at next office visit     Coral CeoBrian P , NP 05/26/2018   This appointment was 28 min long with over 50% of the time in direct face-to-face patient care, assessment, plan of care, and follow-up.

## 2018-05-26 NOTE — Assessment & Plan Note (Signed)
Assessment: Fever Chills Body aches Cough, sore throat  Plan: In office flu swab negative Discussed with patient and she agrees for respiratory virus panel to be swab Treat his upper respiratory infection at this time We will prescribe Tamiflu if patient is positive for flu and respiratory virus panel

## 2018-05-26 NOTE — Assessment & Plan Note (Signed)
Assessment: BMI 40.1 Mallampati 4  Plan: Actively work on losing weight Could consider referral to medical weight management at next office visit Consider screening for obstructive sleep apnea at next office visit

## 2018-05-26 NOTE — Progress Notes (Signed)
Discussed results with patient in office.  Nothing further is needed at this time. RVP ordered.   Elisha Headland FNP

## 2018-05-26 NOTE — Progress Notes (Signed)
Reviewed, agree 

## 2018-05-26 NOTE — Assessment & Plan Note (Addendum)
Assessment: Probable upper respiratory infection Lungs clear to auscultation today  Plan: Continue Allegra daily Continue Singulair daily Continue Symbicort 160 2 puffs twice daily Follow-up with our office in 3 to 6 months

## 2018-05-26 NOTE — Patient Instructions (Addendum)
Flu swab in office is negative  We have performed a respiratory virus panel can call you tomorrow with those results  Continue Symbicort 160 >>> 2 puffs in the morning right when you wake up, rinse out your mouth after use, 12 hours later 2 puffs, rinse after use >>> Take this daily, no matter what >>> This is not a rescue inhaler   Make sure you are handwashing well Make sure you are hydrating well with water  Okay to use Delsym or Mucinex D over-the-counter for cough Continue Singulair daily Continue Allegra daily  Can use Tylenol over-the-counter or ibuprofen for management of fever  If symptoms are not improving over the next 7 to 10 days please contact our office     Schedule follow-up with Dr. Kendrick FriesMcQuaid in 3 - 6 months or sooner if symptoms worsen      It is flu season:   >>>Remember to be washing your hands regularly, using hand sanitizer, be careful to use around herself with has contact with people who are sick will increase her chances of getting sick yourself. >>> Best ways to protect herself from the flu: Receive the yearly flu vaccine, practice good hand hygiene washing with soap and also using hand sanitizer when available, eat a nutritious meals, get adequate rest, hydrate appropriately   Please contact the office if your symptoms worsen or you have concerns that you are not improving.   Thank you for choosing [facility] for your healthcare, and for allowing us to partner with you on your healthcare journey. I am thankful to be able to provide care to you today.   Elisha HeadlandBrian Deidre Carino FNP-C       Upper Respiratory Infection, Adult An upper respiratory infection (URI) affects the nose, throat, and upper air passages. URIs are caused by germs (viruses). The most common type of URI is often called "the common cold." Medicines cannot cure URIs, but you can do things at home to relieve your symptoms. URIs usually get better within 7-10 days. Follow these  instructions at home: Activity  Rest as needed.  If you have a fever, stay home from work or school until your fever is gone, or until your doctor says you may return to work or school. ? You should stay home until you cannot spread the infection anymore (you are not contagious). ? Your doctor may have you wear a face mask so you have less risk of spreading the infection. Relieving symptoms  Gargle with a salt-water mixture 3-4 times a day or as needed. To make a salt-water mixture, completely dissolve -1 tsp of salt in 1 cup of warm water.  Use a cool-mist humidifier to add moisture to the air. This can help you breathe more easily. Eating and drinking   Drink enough fluid to keep your pee (urine) pale yellow.  Eat soups and other clear broths. General instructions   Take over-the-counter and prescription medicines only as told by your doctor. These include cold medicines, fever reducers, and cough suppressants.  Do not use any products that contain nicotine or tobacco. These include cigarettes and e-cigarettes. If you need help quitting, ask your doctor.  Avoid being where people are smoking (avoid secondhand smoke).  Make sure you get regular shots and get the flu shot every year.  Keep all follow-up visits as told by your doctor. This is important. How to avoid spreading infection to others   Wash your hands often with soap and water. If you do not  have soap and water, use hand sanitizer.  Avoid touching your mouth, face, eyes, or nose.  Cough or sneeze into a tissue or your sleeve or elbow. Do not cough or sneeze into your hand or into the air. Contact a doctor if:  You are getting worse, not better.  You have any of these: ? A fever. ? Chills. ? Brown or red mucus in your nose. ? Yellow or brown fluid (discharge)coming from your nose. ? Pain in your face, especially when you bend forward. ? Swollen neck glands. ? Pain with swallowing. ? White areas in the  back of your throat. Get help right away if:  You have shortness of breath that gets worse.  You have very bad or constant: ? Headache. ? Ear pain. ? Pain in your forehead, behind your eyes, and over your cheekbones (sinus pain). ? Chest pain.  You have long-lasting (chronic) lung disease along with any of these: ? Wheezing. ? Long-lasting cough. ? Coughing up blood. ? A change in your usual mucus.  You have a stiff neck.  You have changes in your: ? Vision. ? Hearing. ? Thinking. ? Mood. Summary  An upper respiratory infection (URI) is caused by a germ called a virus. The most common type of URI is often called "the common cold."  URIs usually get better within 7-10 days.  Take over-the-counter and prescription medicines only as told by your doctor. This information is not intended to replace advice given to you by your health care provider. Make sure you discuss any questions you have with your health care provider. Document Released: 09/25/2007 Document Revised: 11/29/2016 Document Reviewed: 11/29/2016 Elsevier Interactive Patient Education  2019 [employer]

## 2018-05-27 LAB — RESPIRATORY VIRUS PANEL
Influenza A RNA: NOT DETECTED
Influenza B RNA: NOT DETECTED
RSV RNA: NOT DETECTED
hMPV: NOT DETECTED

## 2018-05-27 NOTE — Progress Notes (Signed)
Respiratory Virus Panel - negative. No changes in recs.   Elisha Headland FNP

## 2018-05-28 ENCOUNTER — Telehealth: Payer: Self-pay | Admitting: Pulmonary Disease

## 2018-05-28 MED ORDER — DOXYCYCLINE HYCLATE 100 MG PO TABS: 100.0000 mg | ORAL_TABLET | Freq: Two times a day (BID) | ORAL | 0 refills | Status: DC

## 2018-05-28 MED ORDER — PREDNISONE 10 MG PO TABS: ORAL_TABLET | ORAL | 0 refills | Status: DC

## 2018-05-28 NOTE — Telephone Encounter (Signed)
Sorry to hear the patient is not feeling well.  Can offer:  Prednisone 10mg  tablet  >>>Take 2 tablets (20 mg total) daily for the next 5 days >>> Take with food in the morning  Doxycycline >>> 1 100 mg tablet every 12 hours for 7 days >>>take with food  >>>wear sunscreen    Please place the order  Elisha Headland, FNP

## 2018-05-28 NOTE — Telephone Encounter (Signed)
Pt is aware of below recommendations and voiced her understanding. Rx for Prednisone and Doxycycline have been sent to preferred pharmacy. Nothing further is needed.

## 2018-05-28 NOTE — Telephone Encounter (Signed)
Notes recorded by Earvin Hansen, RN on 05/28/2018 at 1:41 PM EST lmom to return call. ------  Notes recorded by Pilar Grammes, RN on 05/27/2018 at 8:59 AM EST ATC patient, unable to reach [facility] x1 on preferred phone number listed for patient.  ------  Notes recorded by Coral Ceo, NP on 05/27/2018 at 8:50 AM EST Respiratory Virus Panel - negative. No changes in recs.   Elisha Headland FNP ------  Notes recorded by Coral Ceo, NP on 05/26/2018 at 8:18 PM EST Discussed results with patient in office. Nothing further is needed at this time. RVP ordered.   Elisha Headland FNP    Called and spoke to pt.  Pt states that she received a call from our office. It appears that pt was called today to be made aware of results, however per Brian's documentation pt has already been made aware.  Pt is aware that nothing further is needed.  Nothing further is needed.

## 2018-05-28 NOTE — Telephone Encounter (Signed)
Primary Pulmonologist: Dr Kendrick Fries Last office visit and with whom: 05/26/18, Georgeanna Lea What do we see them for (pulmonary problems): asthma Last OV assessment/plan:  Plan: Continue Allegra daily Continue Singulair daily Continue Symbicort 160 2 puffs twice daily Follow-up with our office in 3 to 6 months Flu swab in office is negative  We have performed a respiratory virus panel can call you tomorrow with those results  Continue Symbicort 160 >>> 2 puffs in the morning right when you wake up, rinse out your mouth after use, 12 hours later 2 puffs, rinse after use >>> Take this daily, no matter what >>> This is not a rescue inhaler   Make sure you are handwashing well Make sure you are hydrating well with water  Okay to use Delsym or Mucinex D over-the-counter for cough Continue Singulair daily Continue Allegra daily  Can use Tylenol over-the-counter or ibuprofen for management of fever  If symptoms are not improving over the next 7 to 10 days please contact our office   Was appointment offered to patient (explain)?  Yes, she declined appt because she was just seen and was told to call if not any better.   Reason for call: Patient stated that she has fever 101.5, increased productive cough, with green sputum, sneezing, and chest congestion.  She has been using Symbicort as instructed, nebs TID, delsym, and taking Tylenol.  She is requesting antibiotic to be sent to [facility] in [place].  Message routed to [facility]

## 2018-05-28 NOTE — Telephone Encounter (Signed)
Patient was returning the office call but was not sure why they called requested a call back at home

## 2018-06-30 ENCOUNTER — Other Ambulatory Visit: Payer: Self-pay | Admitting: Family Medicine

## 2018-07-27 ENCOUNTER — Encounter: Payer: Self-pay | Admitting: Family Medicine

## 2018-07-28 ENCOUNTER — Other Ambulatory Visit: Payer: Self-pay

## 2018-07-28 ENCOUNTER — Encounter: Payer: Self-pay | Admitting: Family Medicine

## 2018-07-28 ENCOUNTER — Ambulatory Visit (INDEPENDENT_AMBULATORY_CARE_PROVIDER_SITE_OTHER): Payer: 59 | Admitting: Family Medicine

## 2018-07-28 DIAGNOSIS — J42 Unspecified chronic bronchitis: Secondary | ICD-10-CM | POA: Diagnosis not present

## 2018-07-28 DIAGNOSIS — E1169 Type 2 diabetes mellitus with other specified complication: Secondary | ICD-10-CM | POA: Diagnosis not present

## 2018-07-28 DIAGNOSIS — M1710 Unilateral primary osteoarthritis, unspecified knee: Secondary | ICD-10-CM | POA: Insufficient documentation

## 2018-07-28 DIAGNOSIS — I10 Essential (primary) hypertension: Secondary | ICD-10-CM | POA: Diagnosis not present

## 2018-07-28 DIAGNOSIS — F419 Anxiety disorder, unspecified: Secondary | ICD-10-CM | POA: Diagnosis not present

## 2018-07-28 MED ORDER — MELOXICAM 15 MG PO TABS: 15.0000 mg | ORAL_TABLET | Freq: Every day | ORAL | 3 refills | Status: DC

## 2018-07-28 NOTE — Assessment & Plan Note (Signed)
The current medical regimen is effective;  continue present plan and medications.  

## 2018-07-28 NOTE — Progress Notes (Signed)
   Ht 4\' 11"  (1.499 m)   Wt 199 lb (90.3 kg)   BMI 40.19 kg/m    Subjective:    Patient ID: [name], female    DOB: May 10, 1955, 63 y.o.   MRN: [id number]  HPI: [name] is a 63 y.o. female  Chief Complaint  Patient presents with  . Knee Pain    Left knee pain ongoing 1 month. Unsure of injury. Using Ibuprofen, Tylenol, Ice Pack, Heat, minimal relief.    Telemedicine using audio/video telecommunications for a synchronous communication visit. Today's visit due to COVID-19 isolation precautions I connected with and verified that I am speaking with the correct person using two identifiers.   I discussed the limitations, risks, security and privacy concerns of performing an evaluation and management service by telecommunication and the availability of in person appointments. I also discussed with the patient that there may be a patient responsible charge related to this service. The patient expressed understanding and agreed to proceed. The patient's location is home. I am at home.  Patient follow-up complaints of left knee medial area with no known specific injury but is been uncomfortable to the point of waking her up at night and rarely locking and clicking.  Has tried some ibuprofen but it makes her too sleepy has tried various exercises which has not really helped.  No tenderness no joint swelling no redness. Anxiety has been especially bad during this COVID-19 pandemic.  Patient's breathing has been okay along with her reflux.  Blood pressure also seems to be doing well. Reviewed COVID-19 precautions and social distancing.  Relevant past medical, surgical, family and social history reviewed and updated as indicated. Interim medical history since our last visit reviewed. Allergies and medications reviewed and updated.  Review of Systems  Constitutional: Negative.   Respiratory: Negative.   Cardiovascular: Negative.     Per HPI unless specifically indicated  above     Objective:    Ht 4\' 11"  (1.499 m)   Wt 199 lb (90.3 kg)   BMI 40.19 kg/m   Wt Readings from Last 3 Encounters:  07/28/18 199 lb (90.3 kg)  05/26/18 205 lb 6.4 oz (93.2 kg)  04/07/18 205 lb (93 kg)    Physical Exam  none     Assessment & Plan:   Problem List Items Addressed This Visit      Cardiovascular and Mediastinum   Essential hypertension    The current medical regimen is effective;  continue present plan and medications.         Respiratory   Chronic bronchitis (HCC)    The current medical regimen is effective;  continue present plan and medications.         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         Musculoskeletal and Integument   Knee arthropathy    Left knee medial arthritis trial of meloxicam discuss other physical methods        Other   Anxiety    The current medical regimen is effective;  continue present plan and medications.           Follow up plan: Return if symptoms worsen or fail to improve, for As scheduled.

## 2018-07-28 NOTE — Assessment & Plan Note (Signed)
Left knee medial arthritis trial of meloxicam discuss other physical methods

## 2018-07-30 ENCOUNTER — Encounter: Payer: Self-pay | Admitting: Family Medicine

## 2018-07-30 ENCOUNTER — Ambulatory Visit (INDEPENDENT_AMBULATORY_CARE_PROVIDER_SITE_OTHER): Payer: 59 | Admitting: Family Medicine

## 2018-07-30 VITALS — BP 110/82 | HR 71 | Temp 100.9°F

## 2018-07-30 DIAGNOSIS — J069 Acute upper respiratory infection, unspecified: Secondary | ICD-10-CM | POA: Diagnosis not present

## 2018-07-30 MED ORDER — BENZONATATE 100 MG PO CAPS: 100.0000 mg | ORAL_CAPSULE | Freq: Two times a day (BID) | ORAL | 1 refills | Status: DC | PRN

## 2018-07-30 NOTE — Progress Notes (Signed)
BP 110/82   Pulse 71   Temp (!) 100.9 F (38.3 C)    Subjective:    Patient ID: [name], female    DOB: 03-01-1956, 63 y.o.   MRN: [id number]  HPI: [name] is a 63 y.o. female  Cough cold, fever  Telemedicine using audio/video telecommunications for a synchronous communication visit. Today's visit due to COVID-19 isolation precautions I connected with and verified that I am speaking with the correct person using two identifiers.   I discussed the limitations, risks, security and privacy concerns of performing an evaluation and management service by telecommunication and the availability of in person appointments. I also discussed with the patient that there may be a patient responsible charge related to this service. The patient expressed understanding and agreed to proceed. The patient's location is home. I am at home. Discussed with patient has low-grade temperature currently 100.9 and a nonspecific dry cough feels like her usual bronchitis.  No sinusitis symptoms no GI upset. No generalized achiness. There is been ongoing on for 2 days. Fever started yesterday. Patient staying at home with no known contacts, no one staying in her house. Reviewed COVID-19 with patient and isolation precautions.  Relevant past medical, surgical, family and social history reviewed and updated as indicated. Interim medical history since our last visit reviewed. Allergies and medications reviewed and updated.  Review of Systems  Constitutional: Positive for chills, diaphoresis, fatigue and fever.  HENT: Negative for congestion, rhinorrhea and sinus pain.   Respiratory: Positive for cough. Negative for apnea, choking, chest tightness and shortness of breath.   Cardiovascular: Negative.     Per HPI unless specifically indicated above     Objective:    BP 110/82   Pulse 71   Temp (!) 100.9 F (38.3 C)   Wt Readings from Last 3 Encounters:  07/28/18 199 lb (90.3 kg)   05/26/18 205 lb 6.4 oz (93.2 kg)  04/07/18 205 lb (93 kg)    Physical Exam  Results for orders placed or performed in visit on 05/26/18  Respiratory virus panel  Result Value Ref Range   Influenza A RNA Not Detected Not Detected   Influenza B RNA Not Detected Not Detected   RSV RNA Not Detected Not Detected   hMPV Not Detected Not Detected  POCT Influenza A/B  Result Value Ref Range   Influenza A, POC Negative Negative   Influenza B, POC Negative Negative      Assessment & Plan:   Problem List Items Addressed This Visit    None    Visit Diagnoses    Upper respiratory tract infection, unspecified type    -  Primary    Reviewed with patient possible COVID-19 infection Reviewed care and treatment for upper respiratory infection Tylenol fluids etc. Reviewed if worsening symptoms primarily respiratory will need further evaluation and treatment. Patient has albuterol at home.  Discussed various cough medicines will give Tessalon Perls.  I discussed the assessment and treatment plan with the patient. The patient was provided an opportunity to ask questions and all were answered. The patient agreed with the plan and demonstrated an understanding of the instructions.   The patient was advised to call back or seek an in-person evaluation if the symptoms worsen or if the condition fails to improve as anticipated.   I provided 21+ minutes of time during this encounter.  Follow up plan: Return if symptoms worsen or fail to improve, for As scheduled.

## 2018-07-31 ENCOUNTER — Other Ambulatory Visit: Payer: Self-pay | Admitting: Family Medicine

## 2018-08-16 ENCOUNTER — Encounter: Payer: Self-pay | Admitting: Family Medicine

## 2018-08-17 ENCOUNTER — Ambulatory Visit (INDEPENDENT_AMBULATORY_CARE_PROVIDER_SITE_OTHER): Payer: 59 | Admitting: Family Medicine

## 2018-08-17 ENCOUNTER — Telehealth: Payer: Self-pay

## 2018-08-17 ENCOUNTER — Encounter: Payer: Self-pay | Admitting: Family Medicine

## 2018-08-17 ENCOUNTER — Other Ambulatory Visit: Payer: Self-pay

## 2018-08-17 DIAGNOSIS — M1710 Unilateral primary osteoarthritis, unspecified knee: Secondary | ICD-10-CM

## 2018-08-17 DIAGNOSIS — I10 Essential (primary) hypertension: Secondary | ICD-10-CM | POA: Diagnosis not present

## 2018-08-17 DIAGNOSIS — F3342 Major depressive disorder, recurrent, in full remission: Secondary | ICD-10-CM | POA: Diagnosis not present

## 2018-08-17 NOTE — Assessment & Plan Note (Signed)
The current medical regimen is effective;  continue present plan and medications.  

## 2018-08-17 NOTE — Progress Notes (Addendum)
   There were no vitals taken for this visit.   Subjective:    Patient ID: [name], female    DOB: 16-Nov-1955, 63 y.o.   MRN: [id number]  HPI: [name] is a 63 y.o. female  Knee pain  Telemedicine using audio/video telecommunications for a synchronous communication visit. Today's visit due to COVID-19 isolation precautions I connected with and verified that I am speaking with the correct person using two identifiers.   I discussed the limitations, risks, security and privacy concerns of performing an evaluation and management service by telecommunication and the availability of in person appointments. I also discussed with the patient that there may be a patient responsible charge related to this service. The patient expressed understanding and agreed to proceed. The patient's location is home. I am at home.  Patient with complaints of fell with her right knee giving way 3 days ago landing on her right knee now with swelling and very painful.  No other trauma or irritation knee just locked then gave way. Blood pressure is been doing well. Nerve medicine doing stable. Cough has resolved from earlier this month.  Relevant past medical, surgical, family and social history reviewed and updated as indicated. Interim medical history since our last visit reviewed. Allergies and medications reviewed and updated.  Review of Systems  Constitutional: Negative.   Respiratory: Negative.   Cardiovascular: Negative.     Per HPI unless specifically indicated above     Objective:    There were no vitals taken for this visit.  Wt Readings from Last 3 Encounters:  07/28/18 199 lb (90.3 kg)  05/26/18 205 lb 6.4 oz (93.2 kg)  04/07/18 205 lb (93 kg)    Physical Exam  Results for orders placed or performed in visit on 05/26/18  Respiratory virus panel  Result Value Ref Range   Influenza A RNA Not Detected Not Detected   Influenza B RNA Not Detected Not Detected   RSV  RNA Not Detected Not Detected   hMPV Not Detected Not Detected  POCT Influenza A/B  Result Value Ref Range   Influenza A, POC Negative Negative   Influenza B, POC Negative Negative      Assessment & Plan:   Problem List Items Addressed This Visit      Cardiovascular and Mediastinum   Essential hypertension    The current medical regimen is effective;  continue present plan and medications.         Musculoskeletal and Integument   Knee arthropathy    Worsening knee with fall associated with knee locking and giving way.  Will refer to [facility] this afternoon for walk-in clinic.        Other   Depression    The current medical regimen is effective;  continue present plan and medications.         I discussed the assessment and treatment plan with the patient. The patient was provided an opportunity to ask questions and all were answered. The patient agreed with the plan and demonstrated an understanding of the instructions.   The patient was advised to call back or seek an in-person evaluation if the symptoms worsen or if the condition fails to improve as anticipated.   I provided 21+ minutes of time during this encounter.  Follow up plan: Return if symptoms worsen or fail to improve, for As scheduled.

## 2018-08-17 NOTE — Assessment & Plan Note (Signed)
Worsening knee with fall associated with knee locking and giving way.  Will refer to [facility] this afternoon for walk-in clinic.

## 2018-08-17 NOTE — Telephone Encounter (Signed)
Patient scheduled for today at 9:30.

## 2018-08-26 ENCOUNTER — Ambulatory Visit: Payer: 59 | Admitting: Pulmonary Disease

## 2018-08-27 ENCOUNTER — Telehealth: Payer: Self-pay

## 2018-08-27 NOTE — Telephone Encounter (Signed)
Received fax from [facility] about Preop Surgical clearance. Last EKG done ~6 months ago. Can this be done virtually? Please advise.    Form placed in file folder between [place] and [place].

## 2018-08-31 ENCOUNTER — Other Ambulatory Visit: Payer: Self-pay | Admitting: Family Medicine

## 2018-08-31 NOTE — Telephone Encounter (Signed)
Requested Prescriptions  Pending Prescriptions Disp Refills  . omeprazole (PRILOSEC) 40 MG capsule [Pharmacy Med Name: OMEPRAZOLE DR 40 MG CAP] 90 capsule 0    Sig: TAKE 1 CAPSULE BY MOUTH EVERY DAY     Gastroenterology: Proton Pump Inhibitors Passed - 08/31/2018 11:41 AM      Passed - Valid encounter within last 12 months    Recent Outpatient Visits          2 weeks ago Essential hypertension   [facility] Crissman, Redge Gainer, MD   1 month ago Upper respiratory tract infection, unspecified type   [facility] Crissman, Redge Gainer, MD   1 month ago Essential hypertension   [facility] Crissman, Redge Gainer, MD   4 months ago Diabetes mellitus without complication (HCC)   [facility] Crissman, Mark A, MD   5 months ago Recurrent major depressive disorder, in full remission [facility])   [facility] Crissman, Redge Gainer, MD      Future Appointments            In 1 month Crissman, Redge Gainer, MD [facility]

## 2018-08-31 NOTE — Telephone Encounter (Signed)
Medication is not on active med list. Next appt October 13, 2018

## 2018-08-31 NOTE — Telephone Encounter (Signed)
Pt called in to check the status of the form. Pt says that she has an apt on Thursday.    Please advise.

## 2018-09-01 NOTE — Telephone Encounter (Signed)
Spoke with pt and advised her that the paper is here.

## 2018-09-03 ENCOUNTER — Encounter: Payer: Self-pay | Admitting: Family Medicine

## 2018-09-03 ENCOUNTER — Ambulatory Visit: Payer: 59 | Admitting: Family Medicine

## 2018-09-03 ENCOUNTER — Other Ambulatory Visit: Payer: Self-pay

## 2018-09-03 VITALS — BP 128/80 | HR 81 | Temp 98.4°F | Ht 60.0 in | Wt 203.0 lb

## 2018-09-03 DIAGNOSIS — Z01818 Encounter for other preprocedural examination: Secondary | ICD-10-CM

## 2018-09-03 LAB — UA/M W/RFLX CULTURE, ROUTINE
Bilirubin, UA: NEGATIVE
Glucose, UA: NEGATIVE
Ketones, UA: NEGATIVE
Leukocytes,UA: NEGATIVE
Nitrite, UA: NEGATIVE
Protein,UA: NEGATIVE
RBC, UA: NEGATIVE
Specific Gravity, UA: 1.015 (ref 1.005–1.030)
Urobilinogen, Ur: 0.2 mg/dL (ref 0.2–1.0)
pH, UA: 7 (ref 5.0–7.5)

## 2018-09-03 NOTE — Progress Notes (Signed)
BP 128/80   Pulse 81   Temp 98.4 F (36.9 C) (Oral)   Ht 5' (1.524 m)   Wt 203 lb (92.1 kg)   SpO2 98%   BMI 39.65 kg/m    Subjective:    Patient ID: Julia JohnCynthia J Masten, female    DOB: [birth year], 63 y.o.   MRN: [id number]  HPI: [name] is a 63 y.o. female  Chief Complaint  Patient presents with  . Surgical Clearance    left knee surgery. May 29   Here today for pre-operative exam for upcoming left knee arthroscopy on May 29th, 2020. Feeling well and in her usual state of health, no new concerns. No CP, SOB, HAs, dizziness, bleeding or bruising issues. Does not take any anticoagulants but does take meloxicam daily as needed for arthritis.   Relevant past medical, surgical, family and social history reviewed and updated as indicated. Interim medical history since our last visit reviewed. Allergies and medications reviewed and updated.  Review of Systems  Per HPI unless specifically indicated above     Objective:    BP 128/80   Pulse 81   Temp 98.4 F (36.9 C) (Oral)   Ht 5' (1.524 m)   Wt 203 lb (92.1 kg)   SpO2 98%   BMI 39.65 kg/m   Wt Readings from Last 3 Encounters:  09/03/18 203 lb (92.1 kg)  07/28/18 199 lb (90.3 kg)  05/26/18 205 lb 6.4 oz (93.2 kg)    Physical Exam Vitals signs and nursing note reviewed.  Constitutional:      Appearance: Normal appearance. She is not ill-appearing.  HENT:     Head: Atraumatic.  Eyes:     Extraocular Movements: Extraocular movements intact.     Conjunctiva/sclera: Conjunctivae normal.  Neck:     Musculoskeletal: Normal range of motion and neck supple.  Cardiovascular:     Rate and Rhythm: Normal rate and regular rhythm.     Heart sounds: Normal heart sounds.  Pulmonary:     Effort: Pulmonary effort is normal.     Breath sounds: Normal breath sounds.  Musculoskeletal: Normal range of motion.  Skin:    General: Skin is warm and dry.  Neurological:     Mental Status: She is alert and oriented to  person, place, and time.  Psychiatric:        Mood and Affect: Mood normal.        Thought Content: Thought content normal.        Judgment: Judgment normal.    EKG today NSR at 80 bpm with no acute ST or T wave changes  Results for orders placed or performed in visit on 09/03/18  CBC with Differential/Platelet  Result Value Ref Range   WBC 4.5 3.4 - 10.8 x10E3/uL   RBC 4.70 3.77 - 5.28 x10E6/uL   Hemoglobin 14.4 11.1 - 15.9 g/dL   Hematocrit 82.942.5 56.234.0 - 46.6 %   MCV 90 79 - 97 fL   MCH 30.6 26.6 - 33.0 pg   MCHC 33.9 31.5 - 35.7 g/dL   RDW 13.011.8 86.511.7 - 78.415.4 %   Platelets 291 150 - 450 x10E3/uL   Neutrophils 55 Not Estab. %   Lymphs 33 Not Estab. %   Monocytes 8 Not Estab. %   Eos 3 Not Estab. %   Basos 1 Not Estab. %   Neutrophils Absolute 2.5 1.4 - 7.0 x10E3/uL   Lymphocytes Absolute 1.5 0.7 - 3.1 x10E3/uL   Monocytes Absolute 0.4  0.1 - 0.9 x10E3/uL   EOS (ABSOLUTE) 0.1 0.0 - 0.4 x10E3/uL   Basophils Absolute 0.0 0.0 - 0.2 x10E3/uL   Immature Granulocytes 0 Not Estab. %   Immature Grans (Abs) 0.0 0.0 - 0.1 x10E3/uL  Basic Metabolic Panel (BMET)  Result Value Ref Range   Glucose 99 65 - 99 mg/dL   BUN 18 8 - 27 mg/dL   Creatinine, Ser 6.28 0.57 - 1.00 mg/dL   GFR calc non Af Amer 93 >59 mL/min/1.73   GFR calc Af Amer 107 >59 mL/min/1.73   BUN/Creatinine Ratio 26 12 - 28   Sodium 141 134 - 144 mmol/L   Potassium 4.1 3.5 - 5.2 mmol/L   Chloride 100 96 - 106 mmol/L   CO2 26 20 - 29 mmol/L   Calcium 9.8 8.7 - 10.3 mg/dL  HgB B1D  Result Value Ref Range   Hgb A1c MFr Bld 6.1 (H) 4.8 - 5.6 %   Est. average glucose Bld gHb Est-mCnc 128 mg/dL  Albumin  Result Value Ref Range   Albumin 4.8 3.8 - 4.8 g/dL  UA/M w/rflx Culture, Routine  Result Value Ref Range   Specific Gravity, UA 1.015 1.005 - 1.030   pH, UA 7.0 5.0 - 7.5   Color, UA Yellow Yellow   Appearance Ur Clear Clear   Leukocytes,UA Negative Negative   Protein,UA Negative Negative/Trace   Glucose, UA  Negative Negative   Ketones, UA Negative Negative   RBC, UA Negative Negative   Bilirubin, UA Negative Negative   Urobilinogen, Ur 0.2 0.2 - 1.0 mg/dL   Nitrite, UA Negative Negative      Assessment & Plan:   Problem List Items Addressed This Visit    None    Visit Diagnoses    Pre-op evaluation    -  Primary   Await pre-op labs, EKG WNL and exam benign. Will sign off and fax form for surgical clearance once labs return if favorable.    Relevant Orders   EKG 12-Lead (Completed)   CBC with Differential/Platelet (Completed)   Basic Metabolic Panel (BMET) (Completed)   HgB A1c (Completed)   Albumin (Completed)   UA/M w/rflx Culture, Routine (Completed)     * Update 09/04/18 - Labs all WNL, patient is cleared for upcoming sugery from our standpoint and should d/c meloxicam 48 hours prior to procedure unless otherwise noted by surgeon and can resume 48 hours after procedure unless otherwise noted by surgeon. Form completed and faxed over*  Follow up plan: Return for as scheduled.

## 2018-09-04 LAB — BASIC METABOLIC PANEL
BUN/Creatinine Ratio: 26 (ref 12–28)
BUN: 18 mg/dL (ref 8–27)
CO2: 26 mmol/L (ref 20–29)
Calcium: 9.8 mg/dL (ref 8.7–10.3)
Chloride: 100 mmol/L (ref 96–106)
Creatinine, Ser: 0.7 mg/dL (ref 0.57–1.00)
GFR calc Af Amer: 107 mL/min/{1.73_m2} (ref >59)
GFR calc non Af Amer: 93 mL/min/{1.73_m2} (ref >59)
Glucose: 99 mg/dL (ref 65–99)
Potassium: 4.1 mmol/L (ref 3.5–5.2)
Sodium: 141 mmol/L (ref 134–144)

## 2018-09-04 LAB — CBC WITH DIFFERENTIAL/PLATELET
Basophils Absolute: 0 10*3/uL (ref 0.0–0.2)
Basos: 1 %
EOS (ABSOLUTE): 0.1 10*3/uL (ref 0.0–0.4)
Eos: 3 %
Hematocrit: 42.5 % (ref 34.0–46.6)
Hemoglobin: 14.4 g/dL (ref 11.1–15.9)
Immature Grans (Abs): 0 10*3/uL (ref 0.0–0.1)
Immature Granulocytes: 0 %
Lymphocytes Absolute: 1.5 10*3/uL (ref 0.7–3.1)
Lymphs: 33 %
MCH: 30.6 pg (ref 26.6–33.0)
MCHC: 33.9 g/dL (ref 31.5–35.7)
MCV: 90 fL (ref 79–97)
Monocytes Absolute: 0.4 10*3/uL (ref 0.1–0.9)
Monocytes: 8 %
Neutrophils Absolute: 2.5 10*3/uL (ref 1.4–7.0)
Neutrophils: 55 %
Platelets: 291 10*3/uL (ref 150–450)
RBC: 4.7 x10E6/uL (ref 3.77–5.28)
RDW: 11.8 % (ref 11.7–15.4)
WBC: 4.5 10*3/uL (ref 3.4–10.8)

## 2018-09-04 LAB — ALBUMIN: Albumin: 4.8 g/dL (ref 3.8–4.8)

## 2018-09-04 LAB — HEMOGLOBIN A1C
Est. average glucose Bld gHb Est-mCnc: 128 mg/dL
Hgb A1c MFr Bld: 6.1 % — ABNORMAL HIGH (ref 4.8–5.6)

## 2018-09-10 DIAGNOSIS — Z9889 Other specified postprocedural states: Secondary | ICD-10-CM | POA: Insufficient documentation

## 2018-09-18 HISTORY — PX: MENISCUS REPAIR: SHX5179

## 2018-09-21 ENCOUNTER — Encounter: Payer: Self-pay | Admitting: Family Medicine

## 2018-09-21 NOTE — Telephone Encounter (Signed)
Pt called and is requesting call back, symptoms are severe. Vomiting/Diarrhea

## 2018-09-21 NOTE — Telephone Encounter (Signed)
Forwarding to provider in office.

## 2018-09-22 ENCOUNTER — Other Ambulatory Visit: Payer: Self-pay | Admitting: Family Medicine

## 2018-09-22 MED ORDER — ONDANSETRON HCL 4 MG PO TABS: 4.0000 mg | ORAL_TABLET | Freq: Three times a day (TID) | ORAL | 0 refills | Status: DC | PRN

## 2018-09-22 NOTE — Progress Notes (Signed)
zofran

## 2018-09-29 ENCOUNTER — Other Ambulatory Visit: Payer: Self-pay | Admitting: Family Medicine

## 2018-09-29 DIAGNOSIS — I10 Essential (primary) hypertension: Secondary | ICD-10-CM

## 2018-09-29 DIAGNOSIS — F3342 Major depressive disorder, recurrent, in full remission: Secondary | ICD-10-CM

## 2018-09-29 NOTE — Telephone Encounter (Signed)
Ok to refill 

## 2018-10-09 ENCOUNTER — Other Ambulatory Visit: Payer: Self-pay | Admitting: Family Medicine

## 2018-10-13 ENCOUNTER — Encounter: Payer: Self-pay | Admitting: Family Medicine

## 2018-10-13 ENCOUNTER — Ambulatory Visit (INDEPENDENT_AMBULATORY_CARE_PROVIDER_SITE_OTHER): Payer: 59 | Admitting: Family Medicine

## 2018-10-13 ENCOUNTER — Other Ambulatory Visit: Payer: Self-pay

## 2018-10-13 DIAGNOSIS — I10 Essential (primary) hypertension: Secondary | ICD-10-CM

## 2018-10-13 DIAGNOSIS — J4530 Mild persistent asthma, uncomplicated: Secondary | ICD-10-CM | POA: Diagnosis not present

## 2018-10-13 DIAGNOSIS — Z0000 Encounter for general adult medical examination without abnormal findings: Secondary | ICD-10-CM

## 2018-10-13 DIAGNOSIS — F339 Major depressive disorder, recurrent, unspecified: Secondary | ICD-10-CM

## 2018-10-13 DIAGNOSIS — E7800 Pure hypercholesterolemia, unspecified: Secondary | ICD-10-CM

## 2018-10-13 DIAGNOSIS — K900 Celiac disease: Secondary | ICD-10-CM

## 2018-10-13 DIAGNOSIS — F3342 Major depressive disorder, recurrent, in full remission: Secondary | ICD-10-CM

## 2018-10-13 DIAGNOSIS — E1169 Type 2 diabetes mellitus with other specified complication: Secondary | ICD-10-CM

## 2018-10-13 MED ORDER — LORAZEPAM 1 MG PO TABS: 0.5000 mg | ORAL_TABLET | Freq: Every day | ORAL | 5 refills | Status: DC | PRN

## 2018-10-13 MED ORDER — ALBUTEROL SULFATE HFA 108 (90 BASE) MCG/ACT IN AERS: 2.0000 | INHALATION_SPRAY | Freq: Four times a day (QID) | RESPIRATORY_TRACT | 2 refills | Status: DC | PRN

## 2018-10-13 MED ORDER — MELOXICAM 15 MG PO TABS: 15.0000 mg | ORAL_TABLET | Freq: Every day | ORAL | 2 refills | Status: DC

## 2018-10-13 MED ORDER — HYOSCYAMINE SULFATE 0.125 MG SL SUBL: SUBLINGUAL_TABLET | SUBLINGUAL | 1 refills | Status: DC

## 2018-10-13 MED ORDER — ATORVASTATIN CALCIUM 10 MG PO TABS: 10.0000 mg | ORAL_TABLET | Freq: Every day | ORAL | 4 refills | Status: DC

## 2018-10-13 MED ORDER — OMEPRAZOLE 40 MG PO CPDR: 40.0000 mg | DELAYED_RELEASE_CAPSULE | Freq: Every day | ORAL | 4 refills | Status: DC

## 2018-10-13 MED ORDER — BUPROPION HCL ER (SR) 150 MG PO TB12: 150.0000 mg | ORAL_TABLET | Freq: Three times a day (TID) | ORAL | 4 refills | Status: DC

## 2018-10-13 MED ORDER — TRELEGY ELLIPTA 100-62.5-25 MCG/INH IN AEPB: 1.0000 | INHALATION_SPRAY | Freq: Every day | RESPIRATORY_TRACT | 4 refills | Status: DC

## 2018-10-13 MED ORDER — HYDROCHLOROTHIAZIDE 25 MG PO TABS: 25.0000 mg | ORAL_TABLET | Freq: Every day | ORAL | 4 refills | Status: DC

## 2018-10-13 MED ORDER — CYCLOBENZAPRINE HCL 5 MG PO TABS: ORAL_TABLET | ORAL | 1 refills | Status: DC

## 2018-10-13 MED ORDER — MYRBETRIQ 50 MG PO TB24: 50.0000 mg | ORAL_TABLET | Freq: Every day | ORAL | 4 refills | Status: DC

## 2018-10-13 MED ORDER — FLUTICASONE PROPIONATE 50 MCG/ACT NA SUSP: 2.0000 | Freq: Two times a day (BID) | NASAL | 4 refills | Status: DC

## 2018-10-13 NOTE — Assessment & Plan Note (Signed)
The current medical regimen is effective;  continue present plan and medications.  

## 2018-10-13 NOTE — Progress Notes (Signed)
There were no vitals taken for this visit.   Subjective:    Patient ID: [name], female    DOB: 1955-06-01, 63 y.o.   MRN: [id number]  HPI: [name] is a 63 y.o. female  Med check  Patient is recovering well from knee surgery has finished physical therapy and is almost back to normal. Taking medications faithfully without problems some confusion over medication either Symbicort or trilogy prefers trilogy over Symbicort and will refill trilogy discontinue Symbicort. Discussed albuterol as rescue inhaler and how to use. Reviewed other medications and updated and stable.   Relevant past medical, surgical, family and social history reviewed and updated as indicated. Interim medical history since our last visit reviewed. Allergies and medications reviewed and updated.  Review of Systems  Constitutional: Negative.   HENT: Negative.   Eyes: Negative.   Respiratory: Negative.   Cardiovascular: Negative.   Gastrointestinal: Negative.   Endocrine: Negative.   Genitourinary: Negative.   Musculoskeletal: Negative.   Skin: Negative.   Allergic/Immunologic: Negative.   Neurological: Negative.   Hematological: Negative.   Psychiatric/Behavioral: Negative.     Per HPI unless specifically indicated above     Objective:    There were no vitals taken for this visit.  Wt Readings from Last 3 Encounters:  09/03/18 203 lb (92.1 kg)  07/28/18 199 lb (90.3 kg)  05/26/18 205 lb 6.4 oz (93.2 kg)    Physical Exam  Results for orders placed or performed in visit on 09/03/18  CBC with Differential/Platelet  Result Value Ref Range   WBC 4.5 3.4 - 10.8 x10E3/uL   RBC 4.70 3.77 - 5.28 x10E6/uL   Hemoglobin 14.4 11.1 - 15.9 g/dL   Hematocrit 42.5 34.0 - 46.6 %   MCV 90 79 - 97 fL   MCH 30.6 26.6 - 33.0 pg   MCHC 33.9 31.5 - 35.7 g/dL   RDW 11.8 11.7 - 15.4 %   Platelets 291 150 - 450 x10E3/uL   Neutrophils 55 Not Estab. %   Lymphs 33 Not Estab. %   Monocytes 8  Not Estab. %   Eos 3 Not Estab. %   Basos 1 Not Estab. %   Neutrophils Absolute 2.5 1.4 - 7.0 x10E3/uL   Lymphocytes Absolute 1.5 0.7 - 3.1 x10E3/uL   Monocytes Absolute 0.4 0.1 - 0.9 x10E3/uL   EOS (ABSOLUTE) 0.1 0.0 - 0.4 x10E3/uL   Basophils Absolute 0.0 0.0 - 0.2 x10E3/uL   Immature Granulocytes 0 Not Estab. %   Immature Grans (Abs) 0.0 0.0 - 0.1 G81E5/[id number]  Basic Metabolic Panel (BMET)  Result Value Ref Range   Glucose 99 65 - 99 mg/dL   BUN 18 8 - 27 mg/dL   Creatinine, Ser 0.70 0.57 - 1.00 mg/dL   GFR calc non Af Amer 93 >59 mL/min/1.73   GFR calc Af Amer 107 >59 mL/min/1.73   BUN/Creatinine Ratio 26 12 - 28   Sodium 141 134 - 144 mmol/L   Potassium 4.1 3.5 - 5.2 mmol/L   Chloride 100 96 - 106 mmol/L   CO2 26 20 - 29 mmol/L   Calcium 9.8 8.7 - 10.3 mg/dL  HgB A1c  Result Value Ref Range   Hgb A1c MFr Bld 6.1 (H) 4.8 - 5.6 %   Est. average glucose Bld gHb Est-mCnc 128 mg/dL  Albumin  Result Value Ref Range   Albumin 4.8 3.8 - 4.8 g/dL  UA/M w/rflx Culture, Routine   Specimen: Urine   URINE  Result  Value Ref Range   Specific Gravity, UA 1.015 1.005 - 1.030   pH, UA 7.0 5.0 - 7.5   Color, UA Yellow Yellow   Appearance Ur Clear Clear   Leukocytes,UA Negative Negative   Protein,UA Negative Negative/Trace   Glucose, UA Negative Negative   Ketones, UA Negative Negative   RBC, UA Negative Negative   Bilirubin, UA Negative Negative   Urobilinogen, Ur 0.2 0.2 - 1.0 mg/dL   Nitrite, UA Negative Negative      Assessment & Plan:   Problem List Items Addressed This Visit      Cardiovascular and Mediastinum   Essential hypertension    The current medical regimen is effective;  continue present plan and medications.       Relevant Medications   hydrochlorothiazide (HYDRODIURIL) 25 MG tablet   atorvastatin (LIPITOR) 10 MG tablet   Other Relevant Orders   Comprehensive metabolic panel     Respiratory   Asthma    The current medical regimen is effective;  continue  present plan and medications.       Relevant Medications   albuterol (PROAIR HFA) 108 (90 Base) MCG/ACT inhaler   Fluticasone-Umeclidin-Vilant (TRELEGY ELLIPTA) 100-62.5-25 MCG/INH AEPB     Digestive   Celiac sprue    The current medical regimen is effective;  continue present plan and medications.         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       Relevant Medications   atorvastatin (LIPITOR) 10 MG tablet     Other   Depression, recurrent (HCC)    The current medical regimen is effective;  continue present plan and medications.       Relevant Medications   buPROPion (WELLBUTRIN SR) 150 MG 12 hr tablet   LORazepam (ATIVAN) 1 MG tablet   Hyperlipidemia    The current medical regimen is effective;  continue present plan and medications.       Relevant Medications   hydrochlorothiazide (HYDRODIURIL) 25 MG tablet   atorvastatin (LIPITOR) 10 MG tablet    Other Visit Diagnoses    PE (physical exam), annual    -  Primary   Relevant Orders   Comprehensive metabolic panel   Lipid panel   TSH       Telemedicine using audio/video telecommunications for a synchronous communication visit. Today's visit due to COVID-19 isolation precautions I connected with and verified that I am speaking with the correct person using two identifiers.   I discussed the limitations, risks, security and privacy concerns of performing an evaluation and management service by telecommunication and the availability of in person appointments. I also discussed with the patient that there may be a patient responsible charge related to this service. The patient expressed understanding and agreed to proceed. The patient's location is home. I am at home.   I discussed the assessment and treatment plan with the patient. The patient was provided an opportunity to ask questions and all  were answered. The patient agreed with the plan and demonstrated an understanding of the instructions.   The patient was advised to call back or seek an in-person evaluation if the symptoms worsen or if the condition fails to improve as anticipated.   I provided 21+ minutes of time during this encounter. Follow up plan: Return for Physical Exam, soon and f/u 6 mo, Hemoglobin A1c, BMP,  Lipids, ALT, AST.

## 2018-10-15 ENCOUNTER — Telehealth: Payer: Self-pay

## 2018-10-15 NOTE — Telephone Encounter (Signed)
Received PA for medication hyoscyamine (LEVSIN SL) 0.125 MG SL tablet. Spoke with Prior Authorization desk with patient's insurance.   Medication is covered under only 1 NDC.  [facility] #: [id number]  Called pharmacy and provided information and NDC number.  That NDC is not in their database and they will have to add it.  Also explained, it may only be covered to be dispensed as #180, instead of #540 like Rx is written.  Pharmacist said if she had any further questions she would give [place] a call back.

## 2018-10-28 ENCOUNTER — Other Ambulatory Visit: Payer: Self-pay | Admitting: Family Medicine

## 2018-10-28 DIAGNOSIS — Z1231 Encounter for screening mammogram for malignant neoplasm of breast: Secondary | ICD-10-CM

## 2018-10-29 ENCOUNTER — Other Ambulatory Visit: Payer: Self-pay | Admitting: Family Medicine

## 2018-11-16 ENCOUNTER — Encounter: Payer: Self-pay | Admitting: Family Medicine

## 2018-12-01 ENCOUNTER — Encounter: Payer: Self-pay | Admitting: Nurse Practitioner

## 2018-12-02 ENCOUNTER — Other Ambulatory Visit: Payer: Self-pay

## 2018-12-02 ENCOUNTER — Encounter: Payer: Self-pay | Admitting: Nurse Practitioner

## 2018-12-02 ENCOUNTER — Ambulatory Visit (INDEPENDENT_AMBULATORY_CARE_PROVIDER_SITE_OTHER): Payer: 59 | Admitting: Nurse Practitioner

## 2018-12-02 VITALS — BP 128/82 | HR 70 | Temp 98.2°F | Ht 59.0 in | Wt 199.0 lb

## 2018-12-02 DIAGNOSIS — Z0000 Encounter for general adult medical examination without abnormal findings: Secondary | ICD-10-CM | POA: Diagnosis not present

## 2018-12-02 DIAGNOSIS — F339 Major depressive disorder, recurrent, unspecified: Secondary | ICD-10-CM | POA: Diagnosis not present

## 2018-12-02 DIAGNOSIS — E785 Hyperlipidemia, unspecified: Secondary | ICD-10-CM

## 2018-12-02 DIAGNOSIS — F419 Anxiety disorder, unspecified: Secondary | ICD-10-CM

## 2018-12-02 DIAGNOSIS — E1169 Type 2 diabetes mellitus with other specified complication: Secondary | ICD-10-CM

## 2018-12-02 DIAGNOSIS — Z6841 Body Mass Index (BMI) 40.0 and over, adult: Secondary | ICD-10-CM

## 2018-12-02 DIAGNOSIS — J4530 Mild persistent asthma, uncomplicated: Secondary | ICD-10-CM

## 2018-12-02 DIAGNOSIS — I10 Essential (primary) hypertension: Secondary | ICD-10-CM

## 2018-12-02 LAB — MICROALBUMIN, URINE WAIVED
Creatinine, Urine Waived: 10 mg/dL (ref 10–300)
Microalb, Ur Waived: 10 mg/L (ref 0–19)

## 2018-12-02 LAB — BAYER DCA HB A1C WAIVED: HB A1C (BAYER DCA - WAIVED): 5.7 % (ref <7.0)

## 2018-12-02 NOTE — Assessment & Plan Note (Signed)
Chronic, stable with A1C 5.7%.  Continue current diet control and praised for success.

## 2018-12-02 NOTE — Assessment & Plan Note (Signed)
Chronic, ongoing.  Continue current medication regimen and adjust as needed.  Consider spirometry next visit.   

## 2018-12-02 NOTE — Progress Notes (Signed)
BP 128/82   Pulse 70   Temp 98.2 F (36.8 C) (Oral)   Ht _0  (1.499 m)   Wt 199 lb (90.3 kg)   SpO2 97%   BMI 40.19 kg/m    Subjective:    Patient ID: MOE BRIER, female    DOB: 03-30-1956, 63 y.o.   MRN: [id number]  HPI: [name] is a 63 y.o. female presenting on 12/02/2018 for comprehensive medical examination. Current medical complaints include:none  She currently lives with: husband Menopausal Symptoms: no   DIABETES Last A1C in May was 6.1%.  Continues on diet control. Hypoglycemic episodes:no Polydipsia/polyuria: no Visual disturbance: no Chest pain: no Paresthesias: no Glucose Monitoring: no  Accucheck frequency: Not Checking  Fasting glucose:  Post prandial:  Evening:  Before meals: Taking Insulin?: no  Long acting insulin:  Short acting insulin: Blood Pressure Monitoring: not checking Retinal Examination: next week scheduled Foot Exam: Up to Date Pneumovax: Up to Date Influenza: Up to Date Aspirin: no  HYPERTENSION / HYPERLIPIDEMIA Continues on Atorvastatin and HCTZ.  She reports that her Apple watch at times reads HR in 40-50 range, but denies any symptoms with this.  Discussed with her that Apple watch not consistently accurate reading & that HR has natural flucuations during hours of day. HR in office 70's today via initial VS and apical check. Satisfied with current treatment? yes Duration of hypertension: chronic BP monitoring frequency: not checking BP range:  BP medication side effects: no Duration of hyperlipidemia: chronic Cholesterol medication side effects: no Cholesterol supplements: none Medication compliance: good compliance Aspirin: no Recent stressors: no Recurrent headaches: no Visual changes: no Palpitations: no Dyspnea: no Chest pain: no Lower extremity edema: no Dizzy/lightheaded: no   ASTHMA Continues on Advair and Albuterol as needed. Reports struggle with having to wear masks.   Asthma status:  stable Satisfied with current treatment?: yes Albuterol/rescue inhaler frequency:  Dyspnea frequency: none Wheezing frequency: occsionally Cough frequency: occasionally Nocturnal symptom frequency: none Limitation of activity: no Current upper respiratory symptoms: no Triggers: masks & humiidity Home peak flows: none Last Spirometry: 2018 Failed/intolerant to following asthma meds: none Asthma meds in past: unknown Aerochamber/spacer use: no Visits to ER or Urgent Care in past year: no Pneumovax: Up to Date Influenza: Up to Date   DEPRESSION Continues on Ativan as needed + Wellbutrin.  Pt is aware of risks of psychoactive medication use to include increased sedation, respiratory suppression, falls, extrapyramidal movements,  dependence and cardiovascular events.  RP and PT would like to continue treatment as benefit determined to outweigh risk.  Has not used Ativan in one month.   Mood status: stable Satisfied with current treatment?: yes Symptom severity: mild  Duration of current treatment : chronic Side effects: no Medication compliance: good compliance Psychotherapy/counseling: none Depressed mood: yes Anxious mood: no Anhedonia: no Significant weight loss or gain: no Insomnia: yes hard to fall asleep Fatigue: no Feelings of worthlessness or guilt: no Impaired concentration/indecisiveness: no Suicidal ideations: no Hopelessness: no Crying spells: no Depression screen [facility] 2/9 12/02/2018 04/07/2018 03/12/2018 10/07/2017 02/04/2017  Decreased Interest 0 0 0 0 0  Down, Depressed, Hopeless 0 0 0 0 0  PHQ - 2 Score 0 0 0 0 0  Altered sleeping 0 0 0 0 0  Tired, decreased energy 0 0 3 0 0  Change in appetite 0 0 0 0 0  Feeling bad or failure about yourself  0 0 0 0 0  Trouble concentrating 0 0 0 0  0  Moving slowly or fidgety/restless 0 0 0 0 0  Suicidal thoughts 0 0 0 0 0  PHQ-9 Score 0 0 3 0 0  Difficult doing work/chores Not difficult at all - Not difficult at all Not  difficult at all Not difficult at all   GAD 7 : Generalized Anxiety Score 08/08/2016  Nervous, Anxious, on Edge 1  Control/stop worrying 0  Worry too much - different things 0  Trouble relaxing 2  Restless 1  Easily annoyed or irritable 2  Afraid - awful might happen 0  Total GAD 7 Score 6  Anxiety Difficulty Somewhat difficult    The patient has a history of falls. I did complete a risk assessment for falls. A plan of care for falls was documented.   Past Medical History:  Past Medical History:  Diagnosis Date  . Asthma   . Depression   . Diabetes mellitus without complication ([place])   . Hyperlipidemia   . Kidney stones   . Migraine headache     Surgical History:  Past Surgical History:  Procedure Laterality Date  . ABDOMINAL HYSTERECTOMY    . BREAST CYST ASPIRATION Right 1981   benign  . BREAST CYST ASPIRATION Right 1984   benign  . CHOLECYSTECTOMY    . COLONOSCOPY  March 2016  . FLEXIBLE BRONCHOSCOPY N/A 01/26/2018   Procedure: FLEXIBLE BRONCHOSCOPY;  Surgeon: Laverle Hobby, MD;  Location: [facility] ORS;  Service: Pulmonary;  Laterality: N/A;  . MENISCUS REPAIR Left 09/18/2018  . OOPHORECTOMY      Medications:  Current Outpatient Medications on File Prior to Visit  Medication Sig  . albuterol (PROAIR HFA) 108 (90 Base) MCG/ACT inhaler Inhale 2 puffs into the lungs every 6 (six) hours as needed for wheezing or shortness of breath.  Marland Kitchen atorvastatin (LIPITOR) 10 MG tablet Take 1 tablet (10 mg total) by mouth daily.  Marland Kitchen buPROPion (WELLBUTRIN SR) 150 MG 12 hr tablet Take 1 tablet (150 mg total) by mouth 3 (three) times daily.  . cyclobenzaprine (FLEXERIL) 5 MG tablet TAKE ONE TABLET BY MOUTH 3 TIMES DAILY AS NEEDED FOR MUSCLE SPASM  . diphenoxylate-atropine (LOMOTIL) 2.5-0.025 MG tablet TAKE 1 TABLET BY MOUTH 4 TIMES DAILY AS NEEDED FOR DIARRHEA OR LOOSE STOOLS  . fluticasone (FLONASE) 50 MCG/ACT nasal spray Place 2 sprays into both nostrils 2 (two) times daily.  .  Fluticasone-Umeclidin-Vilant (TRELEGY ELLIPTA) 100-62.5-25 MCG/INH AEPB Inhale 1 puff into the lungs daily.  . hydrochlorothiazide (HYDRODIURIL) 25 MG tablet Take 1 tablet (25 mg total) by mouth daily.  . hyoscyamine (LEVSIN SL) 0.125 MG SL tablet TAKE ONE TABLET BY MOUTH EVERY 4 HOURS AS NEEDED  . LORazepam (ATIVAN) 1 MG tablet Take 0.5-1 tablets (0.5-1 mg total) by mouth daily as needed for anxiety.  . meloxicam (MOBIC) 15 MG tablet Take 1 tablet (15 mg total) by mouth daily.  Marland Kitchen MYRBETRIQ 50 MG TB24 tablet Take 1 tablet (50 mg total) by mouth daily.  Marland Kitchen omeprazole (PRILOSEC) 40 MG capsule Take 1 capsule (40 mg total) by mouth daily.   No current facility-administered medications on file prior to visit.     Allergies:  Allergies  Allergen Reactions  . Cymbalta [Duloxetine Hcl] Other (See Comments)    Hallucinations  . Amoxicillin Hives    Social History:  Social History   Socioeconomic History  . Marital status: Legally Separated    Spouse name: Not on file  . Number of children: Not on file  . Years of education: Not  on file  . Highest education level: Not on file  Occupational History  . Not on file  Social Needs  . Financial resource strain: Not on file  . Food insecurity    Worry: Not on file    Inability: Not on file  . Transportation needs    Medical: Not on file    Non-medical: Not on file  Tobacco Use  . Smoking status: Never Smoker  . Smokeless tobacco: Never Used  Substance and Sexual Activity  . Alcohol use: Yes  . Drug use: No  . Sexual activity: Not on file  Lifestyle  . Physical activity    Days per week: Not on file    Minutes per session: Not on file  . Stress: Not on file  Relationships  . Social Herbalist on phone: Not on file    Gets together: Not on file    Attends religious service: Not on file    Active member of club or organization: Not on file    Attends meetings of clubs or organizations: Not on file    Relationship status:  Not on file  . Intimate partner violence    Fear of current or ex partner: Not on file    Emotionally abused: Not on file    Physically abused: Not on file    Forced sexual activity: Not on file  Other Topics Concern  . Not on file  Social History Narrative  . Not on file   Social History   Tobacco Use  Smoking Status Never Smoker  Smokeless Tobacco Never Used   Social History   Substance and Sexual Activity  Alcohol Use Yes    Family History:  Family History  Problem Relation Age of Onset  . Hypertension Mother   . Cancer Mother        breast  . Arthritis Mother   . Alzheimer's disease Mother   . Parkinson's disease Mother   . Breast cancer Mother 26  . Hypertension Father   . Diabetes Father   . Kidney cancer Father   . Breast cancer Cousin 86  . Breast cancer Maternal Aunt   . Breast cancer Maternal Grandmother 61    Past medical history, surgical history, medications, allergies, family history and social history reviewed with patient today and changes made to appropriate areas of the chart.   Review of Systems - negative All other ROS negative except what is listed above and in the HPI.      Objective:    BP 128/82   Pulse 70   Temp 98.2 F (36.8 C) (Oral)   Ht _0  (1.499 m)   Wt 199 lb (90.3 kg)   SpO2 97%   BMI 40.19 kg/m   Wt Readings from Last 3 Encounters:  12/02/18 199 lb (90.3 kg)  09/03/18 203 lb (92.1 kg)  07/28/18 199 lb (90.3 kg)    Physical Exam Vitals signs and nursing note reviewed.  Constitutional:      General: She is awake. She is not in acute distress.    Appearance: She is well-developed. She is obese. She is not ill-appearing.  HENT:     Head: Normocephalic.     Right Ear: Hearing, tympanic membrane, ear canal and external ear normal. No drainage.     Left Ear: Hearing, tympanic membrane, ear canal and external ear normal. No drainage.     Nose: Nose normal.     Mouth/Throat:     Mouth:  Mucous membranes are moist.      Pharynx: Oropharynx is clear. Uvula midline.  Eyes:     General: Lids are normal.        Right eye: No discharge.        Left eye: No discharge.     Extraocular Movements: Extraocular movements intact.     Conjunctiva/sclera: Conjunctivae normal.     Pupils: Pupils are equal, round, and reactive to light.     Visual Fields: Right eye visual fields normal and left eye visual fields normal.  Neck:     Musculoskeletal: Normal range of motion and neck supple.     Thyroid: No thyromegaly.     Vascular: No carotid bruit or JVD.  Cardiovascular:     Rate and Rhythm: Normal rate and regular rhythm.     Heart sounds: Normal heart sounds. No murmur. No gallop.   Pulmonary:     Effort: Pulmonary effort is normal. No accessory muscle usage or respiratory distress.     Breath sounds: Normal breath sounds.  Chest:     Breasts:        Right: Normal. No swelling, bleeding, inverted nipple, mass, nipple discharge, skin change or tenderness.        Left: Normal. No swelling, bleeding, inverted nipple, mass, nipple discharge, skin change or tenderness.  Abdominal:     General: Bowel sounds are normal.     Palpations: Abdomen is soft. There is no hepatomegaly or splenomegaly.     Tenderness: There is no abdominal tenderness.  Musculoskeletal:     Right lower leg: No edema.     Left lower leg: No edema.  Lymphadenopathy:     Cervical: No cervical adenopathy.     Upper Body:     Right upper body: No supraclavicular, axillary or pectoral adenopathy.     Left upper body: No supraclavicular, axillary or pectoral adenopathy.  Skin:    General: Skin is warm and dry.     Capillary Refill: Capillary refill takes less than 2 seconds.     Findings: No rash.  Neurological:     Mental Status: She is alert and oriented to person, place, and time.  Psychiatric:        Attention and Perception: Attention normal.        Mood and Affect: Mood normal.        Speech: Speech normal.        Behavior: Behavior  normal. Behavior is cooperative.        Thought Content: Thought content normal.        Cognition and Memory: Cognition normal.        Judgment: Judgment normal.    Diabetic Foot Exam - Simple   Simple Foot Form Visual Inspection No deformities, no ulcerations, no other skin breakdown bilaterally: Yes Sensation Testing Intact to touch and monofilament testing bilaterally: Yes Pulse Check Posterior Tibialis and Dorsalis pulse intact bilaterally: Yes Comments     Results for orders placed or performed in visit on 12/02/18  Bayer DCA Hb A1c Waived  Result Value Ref Range   HB A1C (BAYER DCA - WAIVED) 5.7 <7.0 %  Microalbumin, Urine Waived  Result Value Ref Range   Microalb, Ur Waived 10 0 - 19 mg/L   Creatinine, Urine Waived 10 10 - 300 mg/dL   Microalb/Creat Ratio 30-300 (H) <30 mg/g      Assessment & Plan:   Problem List Items Addressed This Visit      Cardiovascular and  Mediastinum   Essential hypertension    Chronic, stable with BP at goal.  Continue current medication regimen and adjust as needed.  Labs today.  Recommend monitoring BP at home three mornings a week and documenting.          Respiratory   Asthma    Chronic, ongoing.  Continue current medication regimen and adjust as needed.  Consider spirometry next visit.          Endocrine   Diabetes mellitus associated with hormonal etiology ([place])    Chronic, stable with A1C 5.7%.  Continue current diet control and praised for success.      Relevant Orders   Bayer DCA Hb A1c Waived (Completed)   Microalbumin, Urine Waived (Completed)   Hyperlipidemia associated with type 2 diabetes mellitus (HCC)    Chronic, ongoing.  Continue current medication regimen and adjust as needed.        Relevant Orders   Comprehensive metabolic panel   Lipid Panel w/o Chol/HDL Ratio     Other   Depression, recurrent (HCC)    Chronic, ongoing.  Continue current medication regimen and adjust as needed.  Denies SI/HI.         BMI 40.0-44.9, adult ([place])    Recommend continued focus on health diet choices and regular physical activity (30 minutes 5 days a week).      Anxiety    Chronic, ongoing.  Continue current medication regimen.  Minimally uses Ativan.  Discussed with her BEERS criteria guidelines once she ages to 12 and she wishes to continue to reduce her use.         Other Visit Diagnoses    Encounter for annual physical exam    -  Primary   Relevant Orders   CBC with Differential/Platelet   TSH   VITAMIN D 25 Hydroxy (Vit-D Deficiency, Fractures)       Follow up plan: Return in about 6 months (around 06/04/2019) for T2DM, HTN/HLD.   LABORATORY TESTING:  - Pap smear: not applicable  IMMUNIZATIONS:   - Tdap: Tetanus vaccination status reviewed: last tetanus booster within 10 years. - Influenza: Up to date - Pneumovax: Up to date - Prevnar: Up to date - HPV: Not applicable - Zostavax vaccine: Refused  SCREENING: -Mammogram: Up to date  - Colonoscopy: Up to date  - Bone Density: Not applicable  -Hearing Test: Not applicable  -Spirometry: Not applicable   PATIENT COUNSELING:   Advised to take 1 mg of folate supplement per day if capable of pregnancy.   Sexuality: Discussed sexually transmitted diseases, partner selection, use of condoms, avoidance of unintended pregnancy  and contraceptive alternatives.   Advised to avoid cigarette smoking.  I discussed with the patient that most people either abstain from alcohol or drink within safe limits (<=14/week and <=4 drinks/occasion for males, <=7/weeks and <= 3 drinks/occasion for females) and that the risk for alcohol disorders and other health effects rises proportionally with the number of drinks per week and how often a drinker exceeds daily limits.  Discussed cessation/primary prevention of drug use and availability of treatment for abuse.   Diet: Encouraged to adjust caloric intake to maintain  or achieve ideal body weight, to reduce  intake of dietary saturated fat and total fat, to limit sodium intake by avoiding high sodium foods and not adding table salt, and to maintain adequate dietary potassium and calcium preferably from fresh fruits, vegetables, and low-fat dairy products.    stressed the importance of regular exercise  Injury prevention: Discussed safety belts, safety helmets, smoke detector, smoking near bedding or upholstery.   Dental health: Discussed importance of regular tooth brushing, flossing, and dental visits.    NEXT PREVENTATIVE PHYSICAL DUE IN 1 YEAR. Return in about 6 months (around 06/04/2019) for T2DM, HTN/HLD.

## 2018-12-02 NOTE — Assessment & Plan Note (Signed)
Chronic, stable with BP at goal.  Continue current medication regimen and adjust as needed.  Labs today.  Recommend monitoring BP at home three mornings a week and documenting.

## 2018-12-02 NOTE — Assessment & Plan Note (Signed)
Chronic, ongoing.  Continue current medication regimen.  Minimally uses Ativan.  Discussed with her BEERS criteria guidelines once she ages to 89 and she wishes to continue to reduce her use.

## 2018-12-02 NOTE — Assessment & Plan Note (Signed)
Chronic, ongoing.  Continue current medication regimen and adjust as needed.   

## 2018-12-02 NOTE — Assessment & Plan Note (Signed)
Recommend continued focus on health diet choices and regular physical activity (30 minutes 5 days a week). 

## 2018-12-02 NOTE — Assessment & Plan Note (Signed)
Chronic, ongoing.  Continue current medication regimen and adjust as needed.  Denies SI/HI.   

## 2018-12-02 NOTE — Patient Instructions (Addendum)
Rosuvastatin Tablets What is this medicine? ROSUVASTATIN (roe SOO va sta tin) is known as a HMG-CoA reductase inhibitor or 'statin'. It lowers cholesterol and triglycerides in the blood. This drug may also reduce the risk of heart attack, stroke, or other health problems in patients with risk factors for heart disease. Diet and lifestyle changes are often used with this drug. This medicine may be used for other purposes; ask your health care provider or pharmacist if you have questions. COMMON BRAND NAME(S): Crestor What should I tell my health care provider before I take this medicine? They need to know if you have any of these conditions:  diabetes  if you often drink alcohol  history of stroke  kidney disease  liver disease  muscle aches or weakness  thyroid disease  an unusual or allergic reaction to rosuvastatin, other medicines, foods, dyes, or preservatives  pregnant or trying to get pregnant  breast-feeding How should I use this medicine? Take this medicine by mouth with a glass of water. Follow the directions on the prescription label. Do not cut, crush or chew this medicine. You can take this medicine with or without food. Take your doses at regular intervals. Do not take your medicine more often than directed. Talk to your pediatrician regarding the use of this medicine in children. While this drug may be prescribed for children as young as 7 years old for selected conditions, precautions do apply. Overdosage: If you think you have taken too much of this medicine contact a poison control center or emergency room at once. NOTE: This medicine is only for you. Do not share this medicine with others. What if I miss a dose? If you miss a dose, take it as soon as you can. If your next dose is to be taken in less than 12 hours, then do not take the missed dose. Take the next dose at your regular time. Do not take double or extra doses. What may interact with this medicine? Do  not take this medicine with any of the following medications:  herbal medicines like red yeast rice This medicine may also interact with the following medications:  alcohol  antacids containing aluminum hydroxide or magnesium hydroxide  cyclosporine  other medicines for high cholesterol  some medicines for HIV infection  warfarin This list may not describe all possible interactions. Give your health care provider a list of all the medicines, herbs, non-prescription drugs, or dietary supplements you use. Also tell them if you smoke, drink alcohol, or use illegal drugs. Some items may interact with your medicine. What should I watch for while using this medicine? Visit your doctor or health care professional for regular check-ups. You may need regular tests to make sure your liver is working properly. Your health care professional may tell you to stop taking this medicine if you develop muscle problems. If your muscle problems do not go away after stopping this medicine, contact your health care professional. Do not become pregnant while taking this medicine. Women should inform their health care professional if they wish to become pregnant or think they might be pregnant. There is a potential for serious side effects to an unborn child. Talk to your health care professional or pharmacist for more information. Do not breast-feed an infant while taking this medicine. This medicine may increase blood sugar. Ask your healthcare provider if changes in diet or medicines are needed if you have diabetes. If you are going to need surgery or other procedure, tell your doctor   that you are using this medicine. This drug is only part of a total heart-health program. Your doctor or a dietician can suggest a low-cholesterol and low-fat diet to help. Avoid alcohol and smoking, and keep a proper exercise schedule. This medicine may cause a decrease in Co-Enzyme Q-10. You should make sure that you get enough  Co-Enzyme Q-10 while you are taking this medicine. Discuss the foods you eat and the vitamins you take with your health care professional. What side effects may I notice from receiving this medicine? Side effects that you should report to your doctor or health care professional as soon as possible:  allergic reactions like skin rash, itching or hives, swelling of the face, lips, or tongue  confusion  joint pain  loss of memory  redness, blistering, peeling or loosening of the skin, including inside the mouth  signs and symptoms of high blood sugar such as being more thirsty or hungry or having to urinate more than normal. You may also feel very tired or have blurry vision.  signs and symptoms of muscle injury like dark urine; trouble passing urine or change in the amount of urine; unusually weak or tired; muscle pain or side or back pain  yellowing of the eyes or skin Side effects that usually do not require medical attention (report to your doctor or health care professional if they continue or are bothersome):  constipation  diarrhea  dizziness  gas  headache  nausea  stomach pain  trouble sleeping  upset stomach This list may not describe all possible side effects. Call your doctor for medical advice about side effects. You may report side effects to FDA at 1-800-FDA-1088. Where should I keep my medicine? Keep out of the reach of children. Store at room temperature between 20 and 25 degrees C (68 and 77 degrees F). Keep container tightly closed (protect from moisture). Throw away any unused medicine after the expiration date. NOTE: This sheet is a summary. It may not cover all possible information. If you have questions about this medicine, talk to your doctor, pharmacist, or health care provider.  2020 Elsevier/Gold Standard (2018-01-29 08:25:08) Carbohydrate Counting for Diabetes Mellitus, Adult  Carbohydrate counting is a method of keeping track of how many  carbohydrates you eat. Eating carbohydrates naturally increases the amount of sugar (glucose) in the blood. Counting how many carbohydrates you eat helps keep your blood glucose within normal limits, which helps you manage your diabetes (diabetes mellitus). It is important to know how many carbohydrates you can safely have in each meal. This is different for every person. A diet and nutrition specialist (registered dietitian) can help you make a meal plan and calculate how many carbohydrates you should have at each meal and snack. Carbohydrates are found in the following foods:  Grains, such as breads and cereals.  Dried beans and soy products.  Starchy vegetables, such as potatoes, peas, and corn.  Fruit and fruit juices.  Milk and yogurt.  Sweets and snack foods, such as cake, cookies, candy, chips, and soft drinks. How do I count carbohydrates? There are two ways to count carbohydrates in food. You can use either of the methods or a combination of both. Reading "Nutrition Facts" on packaged food The "Nutrition Facts" list is included on the labels of almost all packaged foods and beverages in the [place] It includes:  The serving size.  Information about nutrients in each serving, including the grams (g) of carbohydrate per serving. To use the "Nutrition Facts":  Decide how many servings you will have.  Multiply the number of servings by the number of carbohydrates per serving.  The resulting number is the total amount of carbohydrates that you will be having. Learning standard serving sizes of other foods When you eat carbohydrate foods that are not packaged or do not include "Nutrition Facts" on the label, you need to measure the servings in order to count the amount of carbohydrates:  Measure the foods that you will eat with a food scale or measuring cup, if needed.  Decide how many standard-size servings you will eat.  Multiply the number of servings by 15. Most  carbohydrate-rich foods have about 15 g of carbohydrates per serving. ? For example, if you eat 8 oz (170 g) of strawberries, you will have eaten 2 servings and 30 g of carbohydrates (2 servings x 15 g = 30 g).  For foods that have more than one food mixed, such as soups and casseroles, you must count the carbohydrates in each food that is included. The following list contains standard serving sizes of common carbohydrate-rich foods. Each of these servings has about 15 g of carbohydrates:   hamburger bun or  English muffin.   oz (15 mL) syrup.   oz (14 g) jelly.  1 slice of bread.  1 six-inch tortilla.  3 oz (85 g) cooked rice or pasta.  4 oz (113 g) cooked dried beans.  4 oz (113 g) starchy vegetable, such as peas, corn, or potatoes.  4 oz (113 g) hot cereal.  4 oz (113 g) mashed potatoes or  of a large baked potato.  4 oz (113 g) canned or frozen fruit.  4 oz (120 mL) fruit juice.  4-6 crackers.  6 chicken nuggets.  6 oz (170 g) unsweetened dry cereal.  6 oz (170 g) plain fat-free yogurt or yogurt sweetened with artificial sweeteners.  8 oz (240 mL) milk.  8 oz (170 g) fresh fruit or one small piece of fruit.  24 oz (680 g) popped popcorn. Example of carbohydrate counting Sample meal  3 oz (85 g) chicken breast.  6 oz (170 g) brown rice.  4 oz (113 g) corn.  8 oz (240 mL) milk.  8 oz (170 g) strawberries with sugar-free whipped topping. Carbohydrate calculation 1. Identify the foods that contain carbohydrates: ? Rice. ? Corn. ? Milk. ? Strawberries. 2. Calculate how many servings you have of each food: ? 2 servings rice. ? 1 serving corn. ? 1 serving milk. ? 1 serving strawberries. 3. Multiply each number of servings by 15 g: ? 2 servings rice x 15 g = 30 g. ? 1 serving corn x 15 g = 15 g. ? 1 serving milk x 15 g = 15 g. ? 1 serving strawberries x 15 g = 15 g. 4. Add together all of the amounts to find the total grams of carbohydrates  eaten: ? 30 g + 15 g + 15 g + 15 g = 75 g of carbohydrates total. Summary  Carbohydrate counting is a method of keeping track of how many carbohydrates you eat.  Eating carbohydrates naturally increases the amount of sugar (glucose) in the blood.  Counting how many carbohydrates you eat helps keep your blood glucose within normal limits, which helps you manage your diabetes.  A diet and nutrition specialist (registered dietitian) can help you make a meal plan and calculate how many carbohydrates you should have at each meal and snack. This information is not intended  to replace advice given to you by your health care provider. Make sure you discuss any questions you have with your health care provider. Document Released: 04/08/2005 Document Revised: 10/31/2016 Document Reviewed: 09/20/2015 Elsevier Patient Education  2020 [employer]

## 2018-12-03 ENCOUNTER — Encounter: Payer: Self-pay | Admitting: Nurse Practitioner

## 2018-12-03 DIAGNOSIS — E559 Vitamin D deficiency, unspecified: Secondary | ICD-10-CM | POA: Insufficient documentation

## 2018-12-03 LAB — LIPID PANEL W/O CHOL/HDL RATIO
Cholesterol, Total: 184 mg/dL (ref 100–199)
HDL: 66 mg/dL (ref >39)
LDL Calculated: 94 mg/dL (ref 0–99)
Triglycerides: 119 mg/dL (ref 0–149)
VLDL Cholesterol Cal: 24 mg/dL (ref 5–40)

## 2018-12-03 LAB — CBC WITH DIFFERENTIAL/PLATELET
Basophils Absolute: 0 10*3/uL (ref 0.0–0.2)
Basos: 1 %
EOS (ABSOLUTE): 0.2 10*3/uL (ref 0.0–0.4)
Eos: 3 %
Hematocrit: 39.6 % (ref 34.0–46.6)
Hemoglobin: 13.4 g/dL (ref 11.1–15.9)
Immature Grans (Abs): 0 10*3/uL (ref 0.0–0.1)
Immature Granulocytes: 0 %
Lymphocytes Absolute: 1.8 10*3/uL (ref 0.7–3.1)
Lymphs: 34 %
MCH: 30.7 pg (ref 26.6–33.0)
MCHC: 33.8 g/dL (ref 31.5–35.7)
MCV: 91 fL (ref 79–97)
Monocytes Absolute: 0.4 10*3/uL (ref 0.1–0.9)
Monocytes: 7 %
Neutrophils Absolute: 2.9 10*3/uL (ref 1.4–7.0)
Neutrophils: 55 %
Platelets: 267 10*3/uL (ref 150–450)
RBC: 4.37 x10E6/uL (ref 3.77–5.28)
RDW: 12.4 % (ref 11.7–15.4)
WBC: 5.4 10*3/uL (ref 3.4–10.8)

## 2018-12-03 LAB — COMPREHENSIVE METABOLIC PANEL
ALT: 22 IU/L (ref 0–32)
AST: 15 IU/L (ref 0–40)
Albumin/Globulin Ratio: 2.3 — ABNORMAL HIGH (ref 1.2–2.2)
Albumin: 4.4 g/dL (ref 3.8–4.8)
Alkaline Phosphatase: 73 IU/L (ref 39–117)
BUN/Creatinine Ratio: 19 (ref 12–28)
BUN: 15 mg/dL (ref 8–27)
Bilirubin Total: 0.5 mg/dL (ref 0.0–1.2)
CO2: 25 mmol/L (ref 20–29)
Calcium: 9.6 mg/dL (ref 8.7–10.3)
Chloride: 101 mmol/L (ref 96–106)
Creatinine, Ser: 0.78 mg/dL (ref 0.57–1.00)
GFR calc Af Amer: 94 mL/min/{1.73_m2} (ref >59)
GFR calc non Af Amer: 81 mL/min/{1.73_m2} (ref >59)
Globulin, Total: 1.9 g/dL (ref 1.5–4.5)
Glucose: 81 mg/dL (ref 65–99)
Potassium: 4.1 mmol/L (ref 3.5–5.2)
Sodium: 141 mmol/L (ref 134–144)
Total Protein: 6.3 g/dL (ref 6.0–8.5)

## 2018-12-03 LAB — TSH: TSH: 3.05 u[IU]/mL (ref 0.450–4.500)

## 2018-12-03 LAB — VITAMIN D 25 HYDROXY (VIT D DEFICIENCY, FRACTURES): Vit D, 25-Hydroxy: 21.8 ng/mL — ABNORMAL LOW (ref 30.0–100.0)

## 2018-12-09 ENCOUNTER — Other Ambulatory Visit: Payer: Self-pay

## 2018-12-09 ENCOUNTER — Ambulatory Visit
Admission: RE | Admit: 2018-12-09 | Discharge: 2018-12-09 | Disposition: A | Discharge status: Home / Self Care | Payer: 59 | Source: Ambulatory Visit | Attending: Family Medicine | Admitting: Family Medicine

## 2018-12-09 DIAGNOSIS — Z1231 Encounter for screening mammogram for malignant neoplasm of breast: Secondary | ICD-10-CM | POA: Insufficient documentation

## 2018-12-10 ENCOUNTER — Encounter: Payer: Self-pay | Admitting: Family Medicine

## 2018-12-10 ENCOUNTER — Other Ambulatory Visit: Payer: Self-pay | Admitting: Nurse Practitioner

## 2018-12-10 MED ORDER — CHOLECALCIFEROL 25 MCG (1000 UT) PO CAPS: 1000.0000 [IU] | ORAL_CAPSULE | Freq: Every day | ORAL | 3 refills | Status: DC

## 2018-12-10 NOTE — Progress Notes (Signed)
Vitamin D order

## 2019-02-01 ENCOUNTER — Telehealth: Payer: Self-pay

## 2019-02-01 NOTE — Telephone Encounter (Signed)
Called patient to schedule surgery clearance. Surgery is scheduled for 02/26/19. LVM for patient to please call back to schedule.

## 2019-02-02 NOTE — Telephone Encounter (Signed)
Surgery Clearance scheduled for 02/11/19

## 2019-02-11 ENCOUNTER — Other Ambulatory Visit: Payer: Self-pay

## 2019-02-11 ENCOUNTER — Ambulatory Visit: Payer: 59 | Admitting: Family Medicine

## 2019-02-11 ENCOUNTER — Encounter: Payer: Self-pay | Admitting: Family Medicine

## 2019-02-11 VITALS — BP 146/82 | HR 74 | Temp 98.5°F | Ht 60.0 in | Wt 206.0 lb

## 2019-02-11 DIAGNOSIS — Z01818 Encounter for other preprocedural examination: Secondary | ICD-10-CM | POA: Diagnosis not present

## 2019-02-11 DIAGNOSIS — E559 Vitamin D deficiency, unspecified: Secondary | ICD-10-CM | POA: Diagnosis not present

## 2019-02-11 LAB — UA/M W/RFLX CULTURE, ROUTINE
Bilirubin, UA: NEGATIVE
Glucose, UA: NEGATIVE
Ketones, UA: NEGATIVE
Leukocytes,UA: NEGATIVE
Nitrite, UA: NEGATIVE
Protein,UA: NEGATIVE
RBC, UA: NEGATIVE
Specific Gravity, UA: 1.02 (ref 1.005–1.030)
Urobilinogen, Ur: 0.2 mg/dL (ref 0.2–1.0)
pH, UA: 6 (ref 5.0–7.5)

## 2019-02-11 LAB — BAYER DCA HB A1C WAIVED: HB A1C (BAYER DCA - WAIVED): 5.9 % (ref <7.0)

## 2019-02-11 NOTE — Progress Notes (Signed)
BP (!) 146/82   Pulse 74   Temp 98.5 F (36.9 C) (Oral)   Ht 5' (1.524 m)   Wt 206 lb (93.4 kg)   SpO2 99%   BMI 40.23 kg/m    Subjective:    Patient ID: [name], female    DOB: 1956-02-19, 63 y.o.   MRN: [id number]  HPI: [name] is a 63 y.o. female  Chief Complaint  Patient presents with  . Surgical clearance    left knee replacement   Has never had any problems with surgery. Has never had any problems with having trouble with extubation. Has never had any nausea of vomiting after surgery. No family history of issues with anesthesia. No problems with family history of malignant hyperthermia. She is limited in her walking because of her knee, which hurts, but is not feeling SOB and has no CP. She is otherwise feeling well with no other concerns or complaints at this time.   Active Ambulatory Problems    Diagnosis Date Noted  . Essential hypertension 10/20/2014  . Diabetes mellitus associated with hormonal etiology ([place]) 10/20/2014  . Celiac sprue 10/20/2014  . Depression, recurrent ([place]) 10/20/2014  . BMI 40.0-44.9, adult ([place]) 10/20/2014  . Low back pain 09/19/2015  . Asthma 11/15/2015  . Right sided weakness 09/26/2016  . Mixed stress and urge urinary incontinence 02/25/2017  . Fatty infiltration of liver 08/31/2014  . Anxiety 04/07/2018  . Hyperlipidemia associated with type 2 diabetes mellitus ([place]) 04/07/2018  . Knee arthropathy 07/28/2018  . Vitamin D deficiency 12/03/2018   Resolved Ambulatory Problems    Diagnosis Date Noted  . Leg pain, bilateral 10/20/2014  . Positional vertigo 09/19/2015  . Foreign body in lip 09/19/2015  . Chronic bronchitis ([place]) 03/03/2017  . Right upper quadrant abdominal pain 07/04/2014  . Acute bronchitis, unspecified 03/18/2018  . Acute upper respiratory infection 05/26/2018   Past Medical History:  Diagnosis Date  . Depression   . Diabetes mellitus without complication ([place])   . Hyperlipidemia   .  Kidney stones   . Migraine headache    Past Surgical History:  Procedure Laterality Date  . ABDOMINAL HYSTERECTOMY    . BREAST CYST ASPIRATION Right 1981   benign  . BREAST CYST ASPIRATION Right 1984   benign  . CHOLECYSTECTOMY    . COLONOSCOPY  March 2016  . FLEXIBLE BRONCHOSCOPY N/A 01/26/2018   Procedure: FLEXIBLE BRONCHOSCOPY;  Surgeon: Laverle Hobby, MD;  Location: [facility] ORS;  Service: Pulmonary;  Laterality: N/A;  . MENISCUS REPAIR Left 09/18/2018  . OOPHORECTOMY       Review of Systems  Constitutional: Negative.   Respiratory: Positive for shortness of breath (only with going up 3 flights of stairs). Negative for apnea, cough, choking, chest tightness, wheezing and stridor.   Cardiovascular: Negative.   Gastrointestinal: Negative.   Musculoskeletal: Positive for arthralgias. Negative for back pain, gait problem, joint swelling, myalgias, neck pain and neck stiffness.  Skin: Negative.   Psychiatric/Behavioral: Negative.     Per HPI unless specifically indicated above     Objective:    BP (!) 146/82   Pulse 74   Temp 98.5 F (36.9 C) (Oral)   Ht 5' (1.524 m)   Wt 206 lb (93.4 kg)   SpO2 99%   BMI 40.23 kg/m   Wt Readings from Last 3 Encounters:  02/11/19 206 lb (93.4 kg)  12/02/18 199 lb (90.3 kg)  09/03/18 203 lb (92.1 kg)    Physical Exam  Vitals signs and nursing note reviewed.  Constitutional:      General: She is not in acute distress.    Appearance: Normal appearance. She is not ill-appearing, toxic-appearing or diaphoretic.  HENT:     Head: Normocephalic and atraumatic.     Right Ear: External ear normal.     Left Ear: External ear normal.     Nose: Nose normal.     Mouth/Throat:     Mouth: Mucous membranes are moist.     Pharynx: Oropharynx is clear.  Eyes:     General: No scleral icterus.       Right eye: No discharge.        Left eye: No discharge.     Extraocular Movements: Extraocular movements intact.     Conjunctiva/sclera:  Conjunctivae normal.     Pupils: Pupils are equal, round, and reactive to light.  Neck:     Musculoskeletal: Normal range of motion and neck supple.  Cardiovascular:     Rate and Rhythm: Normal rate and regular rhythm.     Pulses: Normal pulses.     Heart sounds: Normal heart sounds. No murmur. No friction rub. No gallop.   Pulmonary:     Effort: Pulmonary effort is normal. No respiratory distress.     Breath sounds: Normal breath sounds. No stridor. No wheezing, rhonchi or rales.  Chest:     Chest wall: No tenderness.  Musculoskeletal: Normal range of motion.  Skin:    General: Skin is warm and dry.     Capillary Refill: Capillary refill takes less than 2 seconds.     Coloration: Skin is not jaundiced or pale.     Findings: No bruising, erythema, lesion or rash.  Neurological:     General: No focal deficit present.     Mental Status: She is alert and oriented to person, place, and time. Mental status is at baseline.  Psychiatric:        Mood and Affect: Mood normal.        Behavior: Behavior normal.        Thought Content: Thought content normal.        Judgment: Judgment normal.     Results for orders placed or performed in visit on 12/02/18  Bayer DCA Hb A1c Waived  Result Value Ref Range   HB A1C (BAYER DCA - WAIVED) 5.7 <7.0 %  Microalbumin, Urine Waived  Result Value Ref Range   Microalb, Ur Waived 10 0 - 19 mg/L   Creatinine, Urine Waived 10 10 - 300 mg/dL   Microalb/Creat Ratio 30-300 (H) <30 mg/g  Comprehensive metabolic panel  Result Value Ref Range   Glucose 81 65 - 99 mg/dL   BUN 15 8 - 27 mg/dL   Creatinine, Ser 0.78 0.57 - 1.00 mg/dL   GFR calc non Af Amer 81 >59 mL/min/1.73   GFR calc Af Amer 94 >59 mL/min/1.73   BUN/Creatinine Ratio 19 12 - 28   Sodium 141 134 - 144 mmol/L   Potassium 4.1 3.5 - 5.2 mmol/L   Chloride 101 96 - 106 mmol/L   CO2 25 20 - 29 mmol/L   Calcium 9.6 8.7 - 10.3 mg/dL   Total Protein 6.3 6.0 - 8.5 g/dL   Albumin 4.4 3.8 - 4.8  g/dL   Globulin, Total 1.9 1.5 - 4.5 g/dL   Albumin/Globulin Ratio 2.3 (H) 1.2 - 2.2   Bilirubin Total 0.5 0.0 - 1.2 mg/dL   Alkaline Phosphatase 73 39 - 117  IU/L   AST 15 0 - 40 IU/L   ALT 22 0 - 32 IU/L  CBC with Differential/Platelet  Result Value Ref Range   WBC 5.4 3.4 - 10.8 x10E3/uL   RBC 4.37 3.77 - 5.28 x10E6/uL   Hemoglobin 13.4 11.1 - 15.9 g/dL   Hematocrit 39.6 34.0 - 46.6 %   MCV 91 79 - 97 fL   MCH 30.7 26.6 - 33.0 pg   MCHC 33.8 31.5 - 35.7 g/dL   RDW 12.4 11.7 - 15.4 %   Platelets 267 150 - 450 x10E3/uL   Neutrophils 55 Not Estab. %   Lymphs 34 Not Estab. %   Monocytes 7 Not Estab. %   Eos 3 Not Estab. %   Basos 1 Not Estab. %   Neutrophils Absolute 2.9 1.4 - 7.0 x10E3/uL   Lymphocytes Absolute 1.8 0.7 - 3.1 x10E3/uL   Monocytes Absolute 0.4 0.1 - 0.9 x10E3/uL   EOS (ABSOLUTE) 0.2 0.0 - 0.4 x10E3/uL   Basophils Absolute 0.0 0.0 - 0.2 x10E3/uL   Immature Granulocytes 0 Not Estab. %   Immature Grans (Abs) 0.0 0.0 - 0.1 x10E3/uL  Lipid Panel w/o Chol/HDL Ratio  Result Value Ref Range   Cholesterol, Total 184 100 - 199 mg/dL   Triglycerides 119 0 - 149 mg/dL   HDL 66 >39 mg/dL   VLDL Cholesterol Cal 24 5 - 40 mg/dL   LDL Calculated 94 0 - 99 mg/dL  TSH  Result Value Ref Range   TSH 3.050 0.450 - 4.500 uIU/mL  VITAMIN D 25 Hydroxy (Vit-D Deficiency, Fractures)  Result Value Ref Range   Vit D, 25-Hydroxy 21.8 (L) 30.0 - 100.0 ng/mL      Assessment & Plan:   Problem List Items Addressed This Visit      Other   Vitamin D deficiency    Doesn't feel like she's getting better. Would like to recheck. Await results.       Relevant Orders   VITAMIN D 25 Hydroxy (Vit-D Deficiency, Fractures)    Other Visit Diagnoses    Preop exam for internal medicine    -  Primary   EKG normal. Checking labs today. Needs to lose 1lb to be below BMI of 40. Await results. Call with any concerns.    Relevant Orders   CBC with Differential/Platelet   Basic metabolic panel    Bayer DCA Hb A1c Waived   UA/M w/rflx Culture, Routine   EKG 12-Lead (Completed)       Follow up plan: Return if symptoms worsen or fail to improve.

## 2019-02-11 NOTE — Assessment & Plan Note (Signed)
Doesn't feel like she's getting better. Would like to recheck. Await results.

## 2019-02-12 LAB — CBC WITH DIFFERENTIAL/PLATELET
Basophils Absolute: 0 10*3/uL (ref 0.0–0.2)
Basos: 1 %
EOS (ABSOLUTE): 0.3 10*3/uL (ref 0.0–0.4)
Eos: 5 %
Hematocrit: 39.8 % (ref 34.0–46.6)
Hemoglobin: 13.2 g/dL (ref 11.1–15.9)
Immature Grans (Abs): 0 10*3/uL (ref 0.0–0.1)
Immature Granulocytes: 0 %
Lymphocytes Absolute: 2.3 10*3/uL (ref 0.7–3.1)
Lymphs: 39 %
MCH: 30 pg (ref 26.6–33.0)
MCHC: 33.2 g/dL (ref 31.5–35.7)
MCV: 91 fL (ref 79–97)
Monocytes Absolute: 0.4 10*3/uL (ref 0.1–0.9)
Monocytes: 7 %
Neutrophils Absolute: 2.8 10*3/uL (ref 1.4–7.0)
Neutrophils: 48 %
Platelets: 258 10*3/uL (ref 150–450)
RBC: 4.4 x10E6/uL (ref 3.77–5.28)
RDW: 11.7 % (ref 11.7–15.4)
WBC: 5.9 10*3/uL (ref 3.4–10.8)

## 2019-02-12 LAB — BASIC METABOLIC PANEL
BUN/Creatinine Ratio: 17 (ref 12–28)
BUN: 15 mg/dL (ref 8–27)
CO2: 25 mmol/L (ref 20–29)
Calcium: 9.5 mg/dL (ref 8.7–10.3)
Chloride: 101 mmol/L (ref 96–106)
Creatinine, Ser: 0.88 mg/dL (ref 0.57–1.00)
GFR calc Af Amer: 80 mL/min/{1.73_m2} (ref >59)
GFR calc non Af Amer: 70 mL/min/{1.73_m2} (ref >59)
Glucose: 80 mg/dL (ref 65–99)
Potassium: 3.8 mmol/L (ref 3.5–5.2)
Sodium: 142 mmol/L (ref 134–144)

## 2019-02-14 ENCOUNTER — Other Ambulatory Visit: Payer: Self-pay | Admitting: Family Medicine

## 2019-02-14 MED ORDER — VITAMIN D (ERGOCALCIFEROL) 1.25 MG (50000 UNIT) PO CAPS: 50000.0000 [IU] | ORAL_CAPSULE | ORAL | 0 refills | Status: DC

## 2019-02-16 LAB — SPECIMEN STATUS REPORT

## 2019-02-16 LAB — VITAMIN D 25 HYDROXY (VIT D DEFICIENCY, FRACTURES): Vit D, 25-Hydroxy: 22.8 ng/mL — ABNORMAL LOW (ref 30.0–100.0)

## 2019-02-21 HISTORY — PX: PARTIAL KNEE ARTHROPLASTY: SHX2174

## 2019-03-11 ENCOUNTER — Other Ambulatory Visit: Payer: Self-pay | Admitting: Orthopedic Surgery

## 2019-03-11 ENCOUNTER — Other Ambulatory Visit: Payer: Self-pay

## 2019-03-11 ENCOUNTER — Ambulatory Visit
Admission: RE | Admit: 2019-03-11 | Discharge: 2019-03-11 | Disposition: A | Discharge status: Home / Self Care | Payer: 59 | Source: Ambulatory Visit | Attending: Orthopedic Surgery | Admitting: Orthopedic Surgery

## 2019-03-11 DIAGNOSIS — M7989 Other specified soft tissue disorders: Secondary | ICD-10-CM | POA: Diagnosis present

## 2019-03-16 ENCOUNTER — Encounter: Payer: Self-pay | Admitting: Family Medicine

## 2019-03-26 MED ORDER — VITAMIN D (ERGOCALCIFEROL) 1.25 MG (50000 UNIT) PO CAPS: 50000.0000 [IU] | ORAL_CAPSULE | ORAL | 0 refills | Status: DC

## 2019-04-28 ENCOUNTER — Ambulatory Visit: Payer: Self-pay | Admitting: Family Medicine

## 2019-04-29 ENCOUNTER — Ambulatory Visit: Payer: Self-pay | Admitting: Family Medicine

## 2019-06-08 ENCOUNTER — Ambulatory Visit: Payer: 59 | Admitting: Nurse Practitioner

## 2019-06-09 ENCOUNTER — Encounter: Payer: Self-pay | Admitting: Family Medicine

## 2019-06-09 ENCOUNTER — Telehealth (INDEPENDENT_AMBULATORY_CARE_PROVIDER_SITE_OTHER): Payer: Managed Care, Other (non HMO) | Admitting: Family Medicine

## 2019-06-09 DIAGNOSIS — E1169 Type 2 diabetes mellitus with other specified complication: Secondary | ICD-10-CM

## 2019-06-09 DIAGNOSIS — F339 Major depressive disorder, recurrent, unspecified: Secondary | ICD-10-CM

## 2019-06-09 DIAGNOSIS — F419 Anxiety disorder, unspecified: Secondary | ICD-10-CM

## 2019-06-09 DIAGNOSIS — I10 Essential (primary) hypertension: Secondary | ICD-10-CM

## 2019-06-09 DIAGNOSIS — E559 Vitamin D deficiency, unspecified: Secondary | ICD-10-CM | POA: Diagnosis not present

## 2019-06-09 DIAGNOSIS — Z1231 Encounter for screening mammogram for malignant neoplasm of breast: Secondary | ICD-10-CM

## 2019-06-09 DIAGNOSIS — E785 Hyperlipidemia, unspecified: Secondary | ICD-10-CM

## 2019-06-09 NOTE — Progress Notes (Signed)
There were no vitals taken for this visit.   Subjective:    Patient ID: [name], female    DOB: 10-03-1955, 64 y.o.   MRN: [id number]  HPI: [name] is a 64 y.o. female  Chief Complaint  Patient presents with  . Depression  . Hyperlipidemia  . Hypertension  . Memory Loss    pt states she wants to discuss memory changes, states she has been having trouble remembering things at work   HYPERTENSION / [place] Satisfied with current treatment? yes Duration of hypertension: chronic BP monitoring frequency: not checking BP medication side effects: no Past BP meds: HCTZ Duration of hyperlipidemia: chronic Cholesterol medication side effects: no Cholesterol supplements: none Past cholesterol medications: atorvastatin Medication compliance: excellent compliance Aspirin: no Recent stressors: yes Recurrent headaches: yes Visual changes: no Palpitations: no Dyspnea: no Chest pain: no Lower extremity edema: no Dizzy/lightheaded: no  DIABETES Hypoglycemic episodes:no Polydipsia/polyuria: no Visual disturbance: no Chest pain: no Paresthesias: no Glucose Monitoring: yes  Accucheck frequency: occasionally  Fasting glucose: 70 today Taking Insulin?: no Blood Pressure Monitoring: not checking Retinal Examination: Not up to Date Foot Exam: Not up to Date Diabetic Education: Completed Pneumovax: Up to Date Influenza: Up to Date Aspirin: no  DEPRESSION- for about 3 months  Has been taking her lorazepam a couple of times a week. Does not need a refill at this time. Mood status: stable Satisfied with current treatment?: yes Symptom severity: mild  Duration of current treatment : chronic Side effects: no Medication compliance: excellent compliance Psychotherapy/counseling: no  Previous psychiatric medications: wellbutrin, lorazepam Depressed mood: no Anxious mood: no Anhedonia: no Significant weight loss or gain: no Insomnia: no  Fatigue:  yes Feelings of worthlessness or guilt: no Impaired concentration/indecisiveness: no Suicidal ideations: no Hopelessness: no Crying spells: no Depression screen [facility] 2/9 06/09/2019 12/02/2018 04/07/2018 03/12/2018 10/07/2017  Decreased Interest 0 0 0 0 0  Down, Depressed, Hopeless 0 0 0 0 0  PHQ - 2 Score 0 0 0 0 0  Altered sleeping 0 0 0 0 0  Tired, decreased energy 0 0 0 3 0  Change in appetite 0 0 0 0 0  Feeling bad or failure about yourself  0 0 0 0 0  Trouble concentrating 0 0 0 0 0  Moving slowly or fidgety/restless 0 0 0 0 0  Suicidal thoughts 0 0 0 0 0  PHQ-9 Score 0 0 0 3 0  Difficult doing work/chores Not difficult at all Not difficult at all - Not difficult at all Not difficult at all     Relevant past medical, surgical, family and social history reviewed and updated as indicated. Interim medical history since our last visit reviewed. Allergies and medications reviewed and updated.  Review of Systems  Constitutional: Negative.   Respiratory: Negative.   Cardiovascular: Negative.   Musculoskeletal: Negative.   Neurological: Negative.   Psychiatric/Behavioral: Negative.     Per HPI unless specifically indicated above     Objective:    There were no vitals taken for this visit.  Wt Readings from Last 3 Encounters:  02/11/19 206 lb (93.4 kg)  12/02/18 199 lb (90.3 kg)  09/03/18 203 lb (92.1 kg)    Physical Exam Vitals and nursing note reviewed.  Pulmonary:     Effort: Pulmonary effort is normal. No respiratory distress.     Comments: Speaking in full sentences Neurological:     Mental Status: She is alert.  Psychiatric:  Mood and Affect: Mood normal.        Behavior: Behavior normal.        Thought Content: Thought content normal.        Judgment: Judgment normal.     Results for orders placed or performed in visit on 02/11/19  CBC with Differential/Platelet  Result Value Ref Range   WBC 5.9 3.4 - 10.8 x10E3/uL   RBC 4.40 3.77 - 5.28 x10E6/uL    Hemoglobin 13.2 11.1 - 15.9 g/dL   Hematocrit 32.9 51.8 - 46.6 %   MCV 91 79 - 97 fL   MCH 30.0 26.6 - 33.0 pg   MCHC 33.2 31.5 - 35.7 g/dL   RDW 84.1 66.0 - 63.0 %   Platelets 258 150 - 450 x10E3/uL   Neutrophils 48 Not Estab. %   Lymphs 39 Not Estab. %   Monocytes 7 Not Estab. %   Eos 5 Not Estab. %   Basos 1 Not Estab. %   Neutrophils Absolute 2.8 1.4 - 7.0 x10E3/uL   Lymphocytes Absolute 2.3 0.7 - 3.1 x10E3/uL   Monocytes Absolute 0.4 0.1 - 0.9 x10E3/uL   EOS (ABSOLUTE) 0.3 0.0 - 0.4 x10E3/uL   Basophils Absolute 0.0 0.0 - 0.2 x10E3/uL   Immature Granulocytes 0 Not Estab. %   Immature Grans (Abs) 0.0 0.0 - 0.1 x10E3/uL  Basic metabolic panel  Result Value Ref Range   Glucose 80 65 - 99 mg/dL   BUN 15 8 - 27 mg/dL   Creatinine, Ser 1.60 0.57 - 1.00 mg/dL   GFR calc non Af Amer 70 >59 mL/min/1.73   GFR calc Af Amer 80 >59 mL/min/1.73   BUN/Creatinine Ratio 17 12 - 28   Sodium 142 134 - 144 mmol/L   Potassium 3.8 3.5 - 5.2 mmol/L   Chloride 101 96 - 106 mmol/L   CO2 25 20 - 29 mmol/L   Calcium 9.5 8.7 - 10.3 mg/dL  Bayer DCA Hb F0X Waived  Result Value Ref Range   HB A1C (BAYER DCA - WAIVED) 5.9 <7.0 %  UA/M w/rflx Culture, Routine   Specimen: Urine   URINE  Result Value Ref Range   Specific Gravity, UA 1.020 1.005 - 1.030   pH, UA 6.0 5.0 - 7.5   Color, UA Yellow Yellow   Appearance Ur Clear Clear   Leukocytes,UA Negative Negative   Protein,UA Negative Negative/Trace   Glucose, UA Negative Negative   Ketones, UA Negative Negative   RBC, UA Negative Negative   Bilirubin, UA Negative Negative   Urobilinogen, Ur 0.2 0.2 - 1.0 mg/dL   Nitrite, UA Negative Negative  VITAMIN D 25 Hydroxy (Vit-D Deficiency, Fractures)  Result Value Ref Range   Vit D, 25-Hydroxy 22.8 (L) 30.0 - 100.0 ng/mL  Specimen status report  Result Value Ref Range   specimen status report Comment       Assessment & Plan:   Problem List Items Addressed This Visit      Cardiovascular  and Mediastinum   Essential hypertension    Feeling well. No concerns. Will get her in for BP and labs next week. Continue current regimen. Continue to monitor. Call with any concerns.       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 Call with any  concerns. Continue to monitor. Refills given today.      Relevant Orders   Bayer DCA Hb A1c Waived   CBC with Differential/Platelet   Comprehensive metabolic panel   UA/M w/rflx Culture, Routine   Hyperlipidemia associated with type 2 diabetes mellitus (HCC)    Will get her in for labs next week and treat as needed. Call with any concerns. Continue to monitor. Refills given today.      Relevant Orders   CBC with Differential/Platelet   Comprehensive metabolic panel   Lipid Panel w/o Chol/HDL Ratio     Other   Depression, recurrent (HCC)    Under good control on current regimen. Continue current regimen. Continue to monitor. Call with any concerns. Refills given. Not due for refill on her lorazepam yet. She will call if she needs a refill.        Relevant Orders   CBC with Differential/Platelet   Comprehensive metabolic panel   TSH   Anxiety    Under good control on current regimen. Continue current regimen. Continue to monitor. Call with any concerns. Refills given. Not due for refill on her lorazepam yet. She will call if she needs a refill.       Vitamin D deficiency    Will recheck labs and treat as needed. Await results.       Relevant Orders   CBC with Differential/Platelet   Comprehensive metabolic panel   VITAMIN D 25 Hydroxy (Vit-D Deficiency, Fractures)    Other Visit Diagnoses    Encounter for screening mammogram for malignant neoplasm of breast       Mammogram ordered today.   Relevant Orders   MM DIGITAL SCREENING  BILATERAL       Follow up plan: Return in about 3 months (around 09/06/2019) for follow up DM.    Marland Kitchen This visit was completed via telephone due to the restrictions of the COVID-19 pandemic. All issues as above were discussed and addressed but no physical exam was performed. If it was felt that the patient should be evaluated in the office, they were directed there. The patient verbally consented to this visit. Patient was unable to complete an audio/visual visit due to Lack of equipment. Due to the catastrophic nature of the COVID-19 pandemic, this visit was done through audio contact only. . Location of the patient: home . Location of the provider: home . Those involved with this call:  . Provider: Olevia Perches, DO . CMA: Tiffany Reel, CMA . Front Desk/Registration: Adela Ports  . Time spent on call: 25 minutes on the phone discussing health concerns. 40 minutes total spent in review of patient's record and preparation of their chart.

## 2019-06-10 ENCOUNTER — Encounter: Payer: Self-pay | Admitting: Family Medicine

## 2019-06-10 NOTE — Assessment & Plan Note (Signed)
Under good control on current regimen. Continue current regimen. Continue to monitor. Call with any concerns. Refills given. Not due for refill on her lorazepam yet. She will call if she needs a refill.

## 2019-06-10 NOTE — Assessment & Plan Note (Signed)
Will get her in for labs next week and treat as needed. Call with any concerns. Continue to monitor. Refills given today.

## 2019-06-10 NOTE — Assessment & Plan Note (Signed)
Will recheck labs and treat as needed. Await results.

## 2019-06-10 NOTE — Assessment & Plan Note (Signed)
Will get her in for labs next week and treat as needed. Call with any concerns. Continue to monitor. Refills given today. 

## 2019-06-10 NOTE — Assessment & Plan Note (Signed)
Under good control on current regimen. Continue current regimen. Continue to monitor. Call with any concerns. Refills given. Not due for refill on her lorazepam yet. She will call if she needs a refill.  

## 2019-06-10 NOTE — Assessment & Plan Note (Signed)
Feeling well. No concerns. Will get her in for BP and labs next week. Continue current regimen. Continue to monitor. Call with any concerns.

## 2019-06-16 ENCOUNTER — Other Ambulatory Visit: Payer: Self-pay

## 2019-06-16 ENCOUNTER — Other Ambulatory Visit: Payer: Managed Care, Other (non HMO)

## 2019-06-16 ENCOUNTER — Ambulatory Visit (INDEPENDENT_AMBULATORY_CARE_PROVIDER_SITE_OTHER): Payer: Managed Care, Other (non HMO)

## 2019-06-16 VITALS — BP 143/78 | HR 73 | Temp 98.4°F

## 2019-06-16 DIAGNOSIS — F339 Major depressive disorder, recurrent, unspecified: Secondary | ICD-10-CM

## 2019-06-16 DIAGNOSIS — E1169 Type 2 diabetes mellitus with other specified complication: Secondary | ICD-10-CM

## 2019-06-16 DIAGNOSIS — E559 Vitamin D deficiency, unspecified: Secondary | ICD-10-CM

## 2019-06-16 DIAGNOSIS — I10 Essential (primary) hypertension: Secondary | ICD-10-CM

## 2019-06-16 DIAGNOSIS — Z0130 Encounter for examination of blood pressure without abnormal findings: Secondary | ICD-10-CM

## 2019-06-16 LAB — UA/M W/RFLX CULTURE, ROUTINE
Bilirubin, UA: NEGATIVE
Glucose, UA: NEGATIVE
Ketones, UA: NEGATIVE
Leukocytes,UA: NEGATIVE
Nitrite, UA: NEGATIVE
Protein,UA: NEGATIVE
RBC, UA: NEGATIVE
Specific Gravity, UA: 1.02 (ref 1.005–1.030)
Urobilinogen, Ur: 1 mg/dL (ref 0.2–1.0)
pH, UA: 7 (ref 5.0–7.5)

## 2019-06-16 LAB — BAYER DCA HB A1C WAIVED: HB A1C (BAYER DCA - WAIVED): 5.7 % (ref <7.0)

## 2019-06-17 LAB — CBC WITH DIFFERENTIAL/PLATELET
Basophils Absolute: 0 10*3/uL (ref 0.0–0.2)
Basos: 1 %
EOS (ABSOLUTE): 0.3 10*3/uL (ref 0.0–0.4)
Eos: 6 %
Hematocrit: 42 % (ref 34.0–46.6)
Hemoglobin: 13.8 g/dL (ref 11.1–15.9)
Immature Grans (Abs): 0 10*3/uL (ref 0.0–0.1)
Immature Granulocytes: 0 %
Lymphocytes Absolute: 1.5 10*3/uL (ref 0.7–3.1)
Lymphs: 37 %
MCH: 30.3 pg (ref 26.6–33.0)
MCHC: 32.9 g/dL (ref 31.5–35.7)
MCV: 92 fL (ref 79–97)
Monocytes Absolute: 0.3 10*3/uL (ref 0.1–0.9)
Monocytes: 8 %
Neutrophils Absolute: 1.9 10*3/uL (ref 1.4–7.0)
Neutrophils: 48 %
Platelets: 235 10*3/uL (ref 150–450)
RBC: 4.56 x10E6/uL (ref 3.77–5.28)
RDW: 12.5 % (ref 11.7–15.4)
WBC: 4.1 10*3/uL (ref 3.4–10.8)

## 2019-06-17 LAB — LIPID PANEL W/O CHOL/HDL RATIO
Cholesterol, Total: 167 mg/dL (ref 100–199)
HDL: 64 mg/dL (ref >39)
LDL Chol Calc (NIH): 77 mg/dL (ref 0–99)
Triglycerides: 155 mg/dL — ABNORMAL HIGH (ref 0–149)
VLDL Cholesterol Cal: 26 mg/dL (ref 5–40)

## 2019-06-17 LAB — COMPREHENSIVE METABOLIC PANEL
ALT: 22 IU/L (ref 0–32)
AST: 14 IU/L (ref 0–40)
Albumin/Globulin Ratio: 2 (ref 1.2–2.2)
Albumin: 4.3 g/dL (ref 3.8–4.8)
Alkaline Phosphatase: 78 IU/L (ref 39–117)
BUN/Creatinine Ratio: 18 (ref 12–28)
BUN: 14 mg/dL (ref 8–27)
Bilirubin Total: 0.6 mg/dL (ref 0.0–1.2)
CO2: 25 mmol/L (ref 20–29)
Calcium: 9.2 mg/dL (ref 8.7–10.3)
Chloride: 101 mmol/L (ref 96–106)
Creatinine, Ser: 0.79 mg/dL (ref 0.57–1.00)
GFR calc Af Amer: 91 mL/min/{1.73_m2} (ref >59)
GFR calc non Af Amer: 79 mL/min/{1.73_m2} (ref >59)
Globulin, Total: 2.1 g/dL (ref 1.5–4.5)
Glucose: 154 mg/dL — ABNORMAL HIGH (ref 65–99)
Potassium: 4 mmol/L (ref 3.5–5.2)
Sodium: 140 mmol/L (ref 134–144)
Total Protein: 6.4 g/dL (ref 6.0–8.5)

## 2019-06-17 LAB — VITAMIN D 25 HYDROXY (VIT D DEFICIENCY, FRACTURES): Vit D, 25-Hydroxy: 69 ng/mL (ref 30.0–100.0)

## 2019-06-17 LAB — TSH: TSH: 1.74 u[IU]/mL (ref 0.450–4.500)

## 2019-06-18 ENCOUNTER — Other Ambulatory Visit: Payer: Self-pay | Admitting: Family Medicine

## 2019-06-18 NOTE — Telephone Encounter (Signed)
Requested medication (s) are due for refill today: yes  Requested medication (s) are on the active medication list: yes  Last refill:  10/13/18 #30 with 5 refills  Future visit scheduled: yes  Notes to clinic: Refill not delegated per protocol.    Requested Prescriptions  Pending Prescriptions Disp Refills   LORazepam (ATIVAN) 1 MG tablet [Pharmacy Med Name: LORAZEPAM 1 MG TAB] 30 tablet     Sig: TAKE 1/2 TO 1 TABLET AS NEEDED FOR ANXIETY      Not Delegated - Psychiatry:  Anxiolytics/Hypnotics Failed - 06/18/2019  5:59 PM      Failed - This refill cannot be delegated      Failed - Urine Drug Screen completed in last 360 days.      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

## 2019-06-21 ENCOUNTER — Other Ambulatory Visit: Payer: Self-pay

## 2019-06-21 NOTE — Telephone Encounter (Signed)
LOV: 06/09/2019, NOV: 09/09/2019 with Olevia Perches, DO

## 2019-06-21 NOTE — Telephone Encounter (Signed)
[facility] and LVM for them to call back to the office.

## 2019-06-21 NOTE — Telephone Encounter (Signed)
Had video visit 06/09/19 and has f/up scheduled for 09/09/19

## 2019-06-21 NOTE — Telephone Encounter (Signed)
She had 6 refills and should not be due- is this because Dr. Christell Faith Rxs are no longer good?

## 2019-06-22 NOTE — Telephone Encounter (Signed)
Called [facility] and spoke with a pharmacist. He stated that patient has no refills left. The 3 refills patient had left are expired. Pharmacist stated that Rx were only good for 6 months.

## 2019-07-30 ENCOUNTER — Other Ambulatory Visit: Payer: Self-pay | Admitting: Family Medicine

## 2019-07-30 NOTE — Telephone Encounter (Signed)
Requested medication (s) are due for refill today: yes  Requested medication (s) are on the active medication list:yes  Last refill:  03/16/19  Future visit scheduled:yes  Notes to clinic:  This dose is not delegated    Requested Prescriptions  Pending Prescriptions Disp Refills   Vitamin D, Ergocalciferol, (DRISDOL) 1.25 MG (50000 UNIT) CAPS capsule [Pharmacy Med Name: VITAMIN D (ERGOCALCIFEROL) 1.25 MG] 12 capsule 0    Sig: TAKE 1 CAPSULE EVERY 7 DAYS      Endocrinology:  Vitamins - Vitamin D Supplementation Failed - 07/30/2019  5:16 PM      Failed - 50,000 IU strengths are not delegated      Failed - Phosphate in normal range and within 360 days    No results found for: PHOS        Passed - Ca in normal range and within 360 days    Calcium  Date Value Ref Range Status  06/16/2019 9.2 8.7 - 10.3 mg/dL Final   Calcium, Total  Date Value Ref Range Status  08/30/2013 9.1 8.5 - 10.1 mg/dL Final          Passed - Vitamin D in normal range and within 360 days    Vit D, 25-Hydroxy  Date Value Ref Range Status  06/16/2019 69.0 30.0 - 100.0 ng/mL Final    Comment:    Vitamin D deficiency has been defined by the Institute of Medicine and an Endocrine Society practice guideline as a level of serum 25-OH vitamin D less than 20 ng/mL (1,2). The Endocrine Society went on to further define vitamin D insufficiency as a level between 21 and 29 ng/mL (2). 1. IOM (Institute of Medicine). 2010. Dietary reference    intakes for calcium and D. [place]: The    [employer]. 2. Holick MF, Binkley Sims, Bischoff-Ferrari HA, et al.    Evaluation, treatment, and prevention of vitamin D    deficiency: an Endocrine Society clinical practice    guideline. JCEM. 2011 Jul; 96(7):1911-30.           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

## 2019-08-19 ENCOUNTER — Telehealth: Payer: 59 | Admitting: Emergency Medicine

## 2019-08-19 DIAGNOSIS — R21 Rash and other nonspecific skin eruption: Secondary | ICD-10-CM

## 2019-08-19 NOTE — Progress Notes (Signed)
Based on what you shared with me, I feel your condition warrants further evaluation and I recommend that you be seen for a face to face office visit.   NOTE: If you entered your credit card information for this eVisit, you will not be charged. You may see a "hold" on your card for the $35 but that hold will drop off and you will not have a charge processed.   If you are having a true medical emergency please call 911.      For an urgent face to face visit, [facility] has five urgent care centers for your convenience:      NEW:  [facility] [place] Get Driving Directions [phone] [address] [place], [place] [address] . 10 am - 6pm Monday - Friday    [facility] Get Driving Directions [phone] [address] , [place] [address] . 10 am to 8 pm Monday-Friday . 12 pm to 8 pm Saturday-Sunday     [facility] Get Driving Directions [phone] [address], [address] [place], [place] [address] . 8 am to 8 pm Monday-Friday . 9 am to 6 pm Saturday . 11 am to 6 pm Sunday     [facility] [place] Get Driving Directions  [phone] [address] [place], [place] [address] . 8 am to 8 pm Monday-Friday . 8 am to 4 pm Saturday-Sunday    [facility] Get Driving Directions [phone] [address] [place], [place] [address] . 12 pm to 6 pm Monday-Friday      Your e-visit answers were reviewed by a board certified advanced clinical practitioner to complete your personal care plan.  Thank you for using e-Visits.    Greater than 5 but less than 10 minutes spent researching, coordinating, and implementing care for this patient today  

## 2019-09-08 DIAGNOSIS — Z96659 Presence of unspecified artificial knee joint: Secondary | ICD-10-CM | POA: Insufficient documentation

## 2019-09-09 ENCOUNTER — Encounter: Payer: Self-pay | Admitting: Family Medicine

## 2019-09-09 ENCOUNTER — Ambulatory Visit: Payer: 59 | Admitting: Family Medicine

## 2019-09-09 ENCOUNTER — Other Ambulatory Visit: Payer: Self-pay

## 2019-09-09 VITALS — BP 156/90 | HR 91 | Temp 98.3°F | Ht <= 58 in | Wt 205.8 lb

## 2019-09-09 DIAGNOSIS — E1169 Type 2 diabetes mellitus with other specified complication: Secondary | ICD-10-CM

## 2019-09-09 DIAGNOSIS — R609 Edema, unspecified: Secondary | ICD-10-CM

## 2019-09-09 DIAGNOSIS — Z6841 Body Mass Index (BMI) 40.0 and over, adult: Secondary | ICD-10-CM

## 2019-09-09 LAB — BAYER DCA HB A1C WAIVED: HB A1C (BAYER DCA - WAIVED): 5.8 % (ref <7.0)

## 2019-09-09 MED ORDER — CHLORTHALIDONE 25 MG PO TABS: 25.0000 mg | ORAL_TABLET | Freq: Every day | ORAL | 1 refills | Status: DC

## 2019-09-09 MED ORDER — TRULICITY 0.75 MG/0.5ML ~~LOC~~ SOAJ: 0.7500 mg | SUBCUTANEOUS | 1 refills | Status: DC

## 2019-09-09 MED ORDER — ATORVASTATIN CALCIUM 10 MG PO TABS: 10.0000 mg | ORAL_TABLET | ORAL | 0 refills | Status: DC

## 2019-09-09 NOTE — Progress Notes (Signed)
BP (!) 156/90 (BP Location: Right Arm, Patient Position: Sitting, Cuff Size: Large)   Pulse 91   Temp 98.3 F (36.8 C) (Oral)   Ht 4' 9.38" (1.457 m)   Wt 205 lb 12.8 oz (93.4 kg)   SpO2 100%   BMI 43.94 kg/m    Subjective:    Patient ID: [name], female    DOB: 1955-12-08, 64 y.o.   MRN: [id number]  HPI: [name] is a 64 y.o. female  Chief Complaint  Patient presents with  . Diabetes  . Weight Loss    working out, diet changes, can not lose weight.    DIABETES Hypoglycemic episodes:no Polydipsia/polyuria: no Visual disturbance: no Chest pain: no Paresthesias: no Glucose Monitoring: yes  Accucheck frequency: Daily Taking Insulin?: no Blood Pressure Monitoring: a few times a month Retinal Examination: Not up to Date Foot Exam: Up to Date Diabetic Education: Completed Pneumovax: Up to Date Influenza: Up to Date Aspirin: yes  WEIGHT GAIN- Has been doing a lot of exercise, has not been exercising 3-4 days a week with a personal trainer and really working on her diet. She is not losing any weight. She is very frustrated.  Duration: chronic Previous attempts at weight loss: yes Complications of obesity: HTN, HLD, DM, arthritis, GERD Peak weight: 206 Weight loss goal: to be healthy  Weight loss to date: 1 lb Requesting obesity pharmacotherapy: yes Current weight loss supplements/medications: no Previous weight loss supplements/meds: no  She also notes that she has been having some extra swelling in her ankles. No redness. No SOB. She is otherwise feeling well with no other concerns or complaints at this time.   Relevant past medical, surgical, family and social history reviewed and updated as indicated. Interim medical history since our last visit reviewed. Allergies and medications reviewed and updated.  Review of Systems  Constitutional: Negative.   HENT: Negative.   Respiratory: Negative.   Cardiovascular: Positive for leg swelling.  Negative for chest pain and palpitations.  Gastrointestinal: Negative.   Musculoskeletal: Negative.   Psychiatric/Behavioral: Negative.     Per HPI unless specifically indicated above     Objective:    BP (!) 156/90 (BP Location: Right Arm, Patient Position: Sitting, Cuff Size: Large)   Pulse 91   Temp 98.3 F (36.8 C) (Oral)   Ht 4' 9.38" (1.457 m)   Wt 205 lb 12.8 oz (93.4 kg)   SpO2 100%   BMI 43.94 kg/m   Wt Readings from Last 3 Encounters:  09/09/19 205 lb 12.8 oz (93.4 kg)  02/11/19 206 lb (93.4 kg)  12/02/18 199 lb (90.3 kg)    Physical Exam Vitals and nursing note reviewed.  Constitutional:      General: She is not in acute distress.    Appearance: Normal appearance. She is obese. She is not ill-appearing, toxic-appearing or diaphoretic.  HENT:     Head: Normocephalic and atraumatic.     Right Ear: External ear normal.     Left Ear: External ear normal.     Nose: Nose normal.     Mouth/Throat:     Mouth: Mucous membranes are moist.     Pharynx: Oropharynx is clear.  Eyes:     General: No scleral icterus.       Right eye: No discharge.        Left eye: No discharge.     Extraocular Movements: Extraocular movements intact.     Conjunctiva/sclera: Conjunctivae normal.     Pupils:  Pupils are equal, round, and reactive to light.  Cardiovascular:     Rate and Rhythm: Normal rate and regular rhythm.     Pulses: Normal pulses.     Heart sounds: Normal heart sounds. No murmur. No friction rub. No gallop.   Pulmonary:     Effort: Pulmonary effort is normal. No respiratory distress.     Breath sounds: Normal breath sounds. No stridor. No wheezing, rhonchi or rales.  Chest:     Chest wall: No tenderness.  Musculoskeletal:        General: Normal range of motion.     Cervical back: Normal range of motion and neck supple.     Right lower leg: Edema (trace) present.     Left lower leg: Edema (trace) present.  Skin:    General: Skin is warm and dry.     Capillary  Refill: Capillary refill takes less than 2 seconds.     Coloration: Skin is not jaundiced or pale.     Findings: No bruising, erythema, lesion or rash.  Neurological:     General: No focal deficit present.     Mental Status: She is alert and oriented to person, place, and time. Mental status is at baseline.  Psychiatric:        Mood and Affect: Mood normal.        Behavior: Behavior normal.        Thought Content: Thought content normal.        Judgment: Judgment normal.     Results for orders placed or performed in visit on 09/09/19  Bayer DCA Hb A1c Waived (STAT)  Result Value Ref Range   HB A1C (BAYER DCA - WAIVED) 5.8 <7.0 %  B Nat Peptide  Result Value Ref Range   BNP 10.7 0.0 - 100.0 pg/mL  Thyroid Panel With TSH  Result Value Ref Range   TSH 2.150 0.450 - 4.500 uIU/mL   T4, Total 7.1 4.5 - 12.0 ug/dL   T3 Uptake Ratio 25 24 - 39 %   Free Thyroxine Index 1.8 1.2 - 4.9  Comprehensive metabolic panel  Result Value Ref Range   Glucose 100 (H) 65 - 99 mg/dL   BUN 18 8 - 27 mg/dL   Creatinine, Ser 4.19 0.57 - 1.00 mg/dL   GFR calc non Af Amer 86 >59 mL/min/1.73   GFR calc Af Amer 99 >59 mL/min/1.73   BUN/Creatinine Ratio 24 12 - 28   Sodium 139 134 - 144 mmol/L   Potassium 3.9 3.5 - 5.2 mmol/L   Chloride 99 96 - 106 mmol/L   CO2 25 20 - 29 mmol/L   Calcium 9.6 8.7 - 10.3 mg/dL   Total Protein 7.2 6.0 - 8.5 g/dL   Albumin 4.8 3.8 - 4.8 g/dL   Globulin, Total 2.4 1.5 - 4.5 g/dL   Albumin/Globulin Ratio 2.0 1.2 - 2.2   Bilirubin Total 0.4 0.0 - 1.2 mg/dL   Alkaline Phosphatase 81 48 - 121 IU/L   AST 19 0 - 40 IU/L   ALT 22 0 - 32 IU/L      Assessment & Plan:   Problem List Items Addressed This Visit      Endocrine   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 We will start trulicity to help with weight loss. Recheck 1 month. Call with any concerns.       Relevant Medications   atorvastatin  (LIPITOR)  10 MG tablet   Dulaglutide (TRULICITY) 0.75 MG/0.5ML SOPN   Other Relevant Orders   Bayer DCA Hb A1c Waived (STAT) (Completed)     Other   BMI 40.0-44.9, adult (HCC)     We will start trulicity to help with weight loss. Recheck 1 month. Call with any concerns. Encouraged diet and exercise with the goal of losing 1-2lbs per week.       Relevant Medications   Dulaglutide (TRULICITY) 0.75 MG/0.5ML SOPN    Other Visit Diagnoses    Peripheral edema       Will change her HCTZ to clorthalidone. Recheck 1 month. Call with any concerns.    Relevant Orders   B Nat Peptide (Completed)   Thyroid Panel With TSH (Completed)   Comprehensive metabolic panel (Completed)       Follow up plan: Return in about 4 weeks (around 10/07/2019).

## 2019-09-10 LAB — COMPREHENSIVE METABOLIC PANEL
ALT: 22 IU/L (ref 0–32)
AST: 19 IU/L (ref 0–40)
Albumin/Globulin Ratio: 2 (ref 1.2–2.2)
Albumin: 4.8 g/dL (ref 3.8–4.8)
Alkaline Phosphatase: 81 IU/L (ref 48–121)
BUN/Creatinine Ratio: 24 (ref 12–28)
BUN: 18 mg/dL (ref 8–27)
Bilirubin Total: 0.4 mg/dL (ref 0.0–1.2)
CO2: 25 mmol/L (ref 20–29)
Calcium: 9.6 mg/dL (ref 8.7–10.3)
Chloride: 99 mmol/L (ref 96–106)
Creatinine, Ser: 0.74 mg/dL (ref 0.57–1.00)
GFR calc Af Amer: 99 mL/min/{1.73_m2} (ref >59)
GFR calc non Af Amer: 86 mL/min/{1.73_m2} (ref >59)
Globulin, Total: 2.4 g/dL (ref 1.5–4.5)
Glucose: 100 mg/dL — ABNORMAL HIGH (ref 65–99)
Potassium: 3.9 mmol/L (ref 3.5–5.2)
Sodium: 139 mmol/L (ref 134–144)
Total Protein: 7.2 g/dL (ref 6.0–8.5)

## 2019-09-10 LAB — THYROID PANEL WITH TSH
Free Thyroxine Index: 1.8 (ref 1.2–4.9)
T3 Uptake Ratio: 25 % (ref 24–39)
T4, Total: 7.1 ug/dL (ref 4.5–12.0)
TSH: 2.15 u[IU]/mL (ref 0.450–4.500)

## 2019-09-10 LAB — BRAIN NATRIURETIC PEPTIDE: BNP: 10.7 pg/mL (ref 0.0–100.0)

## 2019-09-12 ENCOUNTER — Encounter: Payer: Self-pay | Admitting: Family Medicine

## 2019-09-12 NOTE — Assessment & Plan Note (Signed)
Sugars doing great with A1c of 5.8. We will start trulicity to help with weight loss. Recheck 1 month. Call with any concerns.

## 2019-09-12 NOTE — Assessment & Plan Note (Signed)
We will start trulicity to help with weight loss. Recheck 1 month. Call with any concerns. Encouraged diet and exercise with the goal of losing 1-2lbs per week.

## 2019-09-16 ENCOUNTER — Encounter: Payer: Self-pay | Admitting: Family Medicine

## 2019-09-17 ENCOUNTER — Ambulatory Visit: Payer: 59 | Admitting: Family Medicine

## 2019-09-17 ENCOUNTER — Other Ambulatory Visit: Payer: Self-pay

## 2019-09-17 ENCOUNTER — Encounter: Payer: Self-pay | Admitting: Family Medicine

## 2019-09-17 ENCOUNTER — Ambulatory Visit: Payer: Self-pay

## 2019-09-17 VITALS — BP 137/83 | HR 85 | Temp 98.5°F | Wt 202.8 lb

## 2019-09-17 DIAGNOSIS — R42 Dizziness and giddiness: Secondary | ICD-10-CM

## 2019-09-17 MED ORDER — HYDROCHLOROTHIAZIDE 50 MG PO TABS: 50.0000 mg | ORAL_TABLET | Freq: Every day | ORAL | 1 refills | Status: DC

## 2019-09-17 MED ORDER — ATORVASTATIN CALCIUM 10 MG PO TABS: 10.0000 mg | ORAL_TABLET | ORAL | 0 refills | Status: DC

## 2019-09-17 NOTE — Telephone Encounter (Signed)
Patient called stating that she has been dizzy sine starting Hygroton 25 mg.  She states she has just started the medication and feels that it is the cause of her dizziness.  She has a headache today. She states that the room spins as well and she hold to furniture for safety when walking about.  She has never been Dx with vertigo or been dizzy like this in the past. She denies other symptoms Her BP 155/87 today. She states that it is worse on this medication not better.  She states that she has sent several My Chart messaged to Dr Laural Benes. Per protocol patient needs appointment. Dr Laural Benes has none available. Patient was urged to go to UC but instead is asking for Dr Laural Benes to address her concerns in a My Chart message. Patient states she has just been to the office. Last OV 09/09/19. Care advice read to patient She verbalized understanding.  Reason for Disposition . [1] MODERATE dizziness (e.g., vertigo; feels very unsteady, interferes with normal activities) AND [2] has NOT been evaluated by physician for this  Answer Assessment - Initial Assessment Questions 1. DESCRIPTION: "Describe your dizziness."     Lightheaded and spinning 2. VERTIGO: "Do you feel like either you or the room is spinning or tilting?"      yes 3. LIGHTHEADED: "Do you feel lightheaded?" (e.g., somewhat faint, woozy, weak upon standing)     Changing positions 4. SEVERITY: "How bad is it?"  "Can you walk?"   - MILD - Feels unsteady but walking normally.   - MODERATE - Feels very unsteady when walking, but not falling; interferes with normal activities (e.g., school, work) .   - SEVERE - Unable to walk without falling (requires assistance).     Hold on for safety but feels steady 5. ONSET:  "When did the dizziness begin?"     When starting new medication for BP Hygroton 6. AGGRAVATING FACTORS: "Does anything make it worse?" (e.g., standing, change in head position)    Changing position 7. CAUSE: "What do you think is causing  the dizziness?"    unsure 8. RECURRENT SYMPTOM: "Have you had dizziness before?" If so, ask: "When was the last time?" "What happened that time?"     never 9. OTHER SYMPTOMS: "Do you have any other symptoms?" (e.g., headache, weakness, numbness, vomiting, earache)    Headache today 10. PREGNANCY: "Is there any chance you are pregnant?" "When was your last menstrual period?"       N/A  Protocols used: DIZZINESS - VERTIGO-A-AH

## 2019-09-17 NOTE — Telephone Encounter (Signed)
Scheduled today at 415.

## 2019-09-17 NOTE — Progress Notes (Signed)
BP 137/83 (BP Location: Right Arm, Patient Position: Sitting, Cuff Size: Large)   Pulse 85   Temp 98.5 F (36.9 C) (Oral)   Wt 202 lb 12.8 oz (92 kg)   SpO2 97%   BMI 43.30 kg/m    Subjective:    Patient ID: [name], female    DOB: May 20, 1955, 64 y.o.   MRN: [id number]  HPI: [name] is a 64 y.o. female  Chief Complaint  Patient presents with  . Dizziness   DIZZINESS Duration: 2x  Description of symptoms: room spinning Duration of episode: 15 minutes Dizziness frequency: recurrent Provoking factors: none Aggravating factors:  none Triggered by rolling over in bed: no Triggered by bending over: no Aggravated by head movement: no Aggravated by exertion, coughing, loud noises: no Recent head injury: no Recent or current viral symptoms: no History of vasovagal episodes: no Nausea: no Vomiting: no Tinnitus: no Hearing loss: no Aural fullness: no Headache: yes Photophobia/phonophobia: no Unsteady gait: yes Postural instability: no Diplopia, dysarthria, dysphagia or weakness: no Related to exertion: no Pallor: no Diaphoresis: no Dyspnea: no Chest pain: no   Relevant past medical, surgical, family and social history reviewed and updated as indicated. Interim medical history since our last visit reviewed. Allergies and medications reviewed and updated.  Review of Systems  Constitutional: Negative.   HENT: Negative.   Respiratory: Negative.   Cardiovascular: Negative.   Musculoskeletal: Negative.   Neurological: Positive for dizziness, light-headedness and headaches. Negative for tremors, seizures, syncope, facial asymmetry, speech difficulty, weakness and numbness.  Psychiatric/Behavioral: Negative.     Per HPI unless specifically indicated above     Objective:    BP 137/83 (BP Location: Right Arm, Patient Position: Sitting, Cuff Size: Large)   Pulse 85   Temp 98.5 F (36.9 C) (Oral)   Wt 202 lb 12.8 oz (92 kg)   SpO2 97%   BMI  43.30 kg/m   Wt Readings from Last 3 Encounters:  09/17/19 202 lb 12.8 oz (92 kg)  09/09/19 205 lb 12.8 oz (93.4 kg)  02/11/19 206 lb (93.4 kg)    Orthostatic VS for the past 24 hrs (Last 3 readings):  BP- Lying Pulse- Lying BP- Sitting Pulse- Sitting BP- Standing at 0 minutes Pulse- Standing at 0 minutes  09/17/19 1617 133/82 85 137/85 84 122/86 84   Physical Exam Vitals and nursing note reviewed.  Constitutional:      General: She is not in acute distress.    Appearance: Normal appearance. She is not ill-appearing, toxic-appearing or diaphoretic.  HENT:     Head: Normocephalic and atraumatic.     Right Ear: External ear normal.     Left Ear: External ear normal.     Nose: Nose normal.     Mouth/Throat:     Mouth: Mucous membranes are moist.     Pharynx: Oropharynx is clear.  Eyes:     General: No scleral icterus.       Right eye: No discharge.        Left eye: No discharge.     Extraocular Movements: Extraocular movements intact.     Conjunctiva/sclera: Conjunctivae normal.     Pupils: Pupils are equal, round, and reactive to light.  Cardiovascular:     Rate and Rhythm: Normal rate and regular rhythm.     Pulses: Normal pulses.     Heart sounds: Normal heart sounds. No murmur. No friction rub. No gallop.   Pulmonary:     Effort: Pulmonary  effort is normal. No respiratory distress.     Breath sounds: Normal breath sounds. No stridor. No wheezing, rhonchi or rales.  Chest:     Chest wall: No tenderness.  Musculoskeletal:        General: Normal range of motion.     Cervical back: Normal range of motion and neck supple.  Skin:    General: Skin is warm and dry.     Capillary Refill: Capillary refill takes less than 2 seconds.     Coloration: Skin is not jaundiced or pale.     Findings: No bruising, erythema, lesion or rash.  Neurological:     General: No focal deficit present.     Mental Status: She is alert and oriented to person, place, and time. Mental status is at  baseline.  Psychiatric:        Mood and Affect: Mood normal.        Behavior: Behavior normal.        Thought Content: Thought content normal.        Judgment: Judgment normal.     Results for orders placed or performed in visit on 09/09/19  Bayer DCA Hb A1c Waived (STAT)  Result Value Ref Range   HB A1C (BAYER DCA - WAIVED) 5.8 <7.0 %  B Nat Peptide  Result Value Ref Range   BNP 10.7 0.0 - 100.0 pg/mL  Thyroid Panel With TSH  Result Value Ref Range   TSH 2.150 0.450 - 4.500 uIU/mL   T4, Total 7.1 4.5 - 12.0 ug/dL   T3 Uptake Ratio 25 24 - 39 %   Free Thyroxine Index 1.8 1.2 - 4.9  Comprehensive metabolic panel  Result Value Ref Range   Glucose 100 (H) 65 - 99 mg/dL   BUN 18 8 - 27 mg/dL   Creatinine, Ser 0.74 0.57 - 1.00 mg/dL   GFR calc non Af Amer 86 >59 mL/min/1.73   GFR calc Af Amer 99 >59 mL/min/1.73   BUN/Creatinine Ratio 24 12 - 28   Sodium 139 134 - 144 mmol/L   Potassium 3.9 3.5 - 5.2 mmol/L   Chloride 99 96 - 106 mmol/L   CO2 25 20 - 29 mmol/L   Calcium 9.6 8.7 - 10.3 mg/dL   Total Protein 7.2 6.0 - 8.5 g/dL   Albumin 4.8 3.8 - 4.8 g/dL   Globulin, Total 2.4 1.5 - 4.5 g/dL   Albumin/Globulin Ratio 2.0 1.2 - 2.2   Bilirubin Total 0.4 0.0 - 1.2 mg/dL   Alkaline Phosphatase 81 48 - 121 IU/L   AST 19 0 - 40 IU/L   ALT 22 0 - 32 IU/L      Assessment & Plan:   Problem List Items Addressed This Visit    None    Visit Diagnoses    Dizziness    -  Primary   EKG normal, BP dropped a little bit with standing, but no orthostasis. Likely due to her chortalidone. Stop it and restart HCTZ. Call with concerns, recheck 4wk   Relevant Orders   EKG 12-Lead (Completed)       Follow up plan: Return in about 4 weeks (around 10/15/2019).

## 2019-09-17 NOTE — Telephone Encounter (Signed)
Needs appt

## 2019-09-21 ENCOUNTER — Ambulatory Visit: Payer: 59 | Admitting: Family Medicine

## 2019-10-22 ENCOUNTER — Ambulatory Visit (INDEPENDENT_AMBULATORY_CARE_PROVIDER_SITE_OTHER): Payer: 59 | Admitting: Family Medicine

## 2019-10-22 ENCOUNTER — Encounter: Payer: Self-pay | Admitting: Family Medicine

## 2019-10-22 ENCOUNTER — Other Ambulatory Visit: Payer: Self-pay

## 2019-10-22 VITALS — BP 128/68 | HR 85 | Temp 98.1°F | Wt 199.4 lb

## 2019-10-22 DIAGNOSIS — F339 Major depressive disorder, recurrent, unspecified: Secondary | ICD-10-CM | POA: Diagnosis not present

## 2019-10-22 DIAGNOSIS — F3342 Major depressive disorder, recurrent, in full remission: Secondary | ICD-10-CM | POA: Diagnosis not present

## 2019-10-22 DIAGNOSIS — I10 Essential (primary) hypertension: Secondary | ICD-10-CM | POA: Diagnosis not present

## 2019-10-22 DIAGNOSIS — Z6841 Body Mass Index (BMI) 40.0 and over, adult: Secondary | ICD-10-CM

## 2019-10-22 MED ORDER — FLUTICASONE PROPIONATE 50 MCG/ACT NA SUSP: 2.0000 | Freq: Two times a day (BID) | NASAL | 4 refills | Status: DC

## 2019-10-22 MED ORDER — TRELEGY ELLIPTA 100-62.5-25 MCG/INH IN AEPB: 1.0000 | INHALATION_SPRAY | Freq: Every day | RESPIRATORY_TRACT | 4 refills | Status: DC

## 2019-10-22 MED ORDER — TRULICITY 1.5 MG/0.5ML ~~LOC~~ SOAJ
1.5000 mg | SUBCUTANEOUS | 6 refills | Status: DC
Start: 2019-10-22 — End: 2020-02-23

## 2019-10-22 MED ORDER — MYRBETRIQ 50 MG PO TB24: 50.0000 mg | ORAL_TABLET | Freq: Every day | ORAL | 1 refills | Status: DC

## 2019-10-22 MED ORDER — OMEPRAZOLE 40 MG PO CPDR: 40.0000 mg | DELAYED_RELEASE_CAPSULE | Freq: Every day | ORAL | 1 refills | Status: DC

## 2019-10-22 MED ORDER — LORAZEPAM 1 MG PO TABS: ORAL_TABLET | ORAL | 0 refills | Status: DC

## 2019-10-22 MED ORDER — HYOSCYAMINE SULFATE 0.125 MG SL SUBL: SUBLINGUAL_TABLET | SUBLINGUAL | 1 refills | Status: DC

## 2019-10-22 MED ORDER — MELOXICAM 15 MG PO TABS: 15.0000 mg | ORAL_TABLET | Freq: Every day | ORAL | 1 refills | Status: DC

## 2019-10-22 MED ORDER — BUPROPION HCL ER (SR) 150 MG PO TB12: 150.0000 mg | ORAL_TABLET | Freq: Three times a day (TID) | ORAL | 1 refills | Status: DC

## 2019-10-22 NOTE — Assessment & Plan Note (Signed)
Congratulated patient on 7lb weight loss. Tolerating her trulicity well. Will increase to 1.5mg  weekly and recheck at physical. Call with any concerns.

## 2019-10-22 NOTE — Assessment & Plan Note (Signed)
Under good control on current regimen. Continue current regimen. Continue to monitor. Call with any concerns. Refills given. Labs drawn today.   

## 2019-10-22 NOTE — Assessment & Plan Note (Signed)
In mild exacerbation due to her son's illness. Will refill lorazepam. Otherwise stable. Continue current regimen. Continue to monitor. Call with any concerns.

## 2019-10-22 NOTE — Progress Notes (Signed)
BP 128/68 (BP Location: Left Arm, Cuff Size: Large)   Pulse 85   Temp 98.1 F (36.7 C) (Oral)   Wt 199 lb 6.4 oz (90.4 kg)   SpO2 96%   BMI 42.58 kg/m    Subjective:    Patient ID: [name], female    DOB: 29-Mar-1956, 64 y.o.   MRN: [id number]  HPI: [name] is a 64 y.o. female  Chief Complaint  Patient presents with  . Hypertension  . Anxiety    refill ativan   HYPERTENSION Hypertension status: controlled  Satisfied with current treatment? yes Duration of hypertension: chronic BP monitoring frequency:  rarely BP medication side effects:  no Medication compliance: excellent compliance Previous BP meds: HCTZ Aspirin: no Recurrent headaches: no Visual changes: no Palpitations: no Dyspnea: no Chest pain: no Lower extremity edema: no Dizzy/lightheaded: no   OBESITY Duration: Chronic Previous attempts at weight loss: yes Complications of obesity: DM, Depression, HTN, HLD Peak weight: 206 Weight loss goal: to be healthy  Weight loss to date: 7lbs Requesting obesity pharmacotherapy: yes Current weight loss supplements/medications: yes  DEPRESSION- son is having surgery on his eye and she is very anxious about it.  Mood status: exacerbated Satisfied with current treatment?: yes Symptom severity: moderate  Duration of current treatment : chronic Side effects: no Medication compliance: excellent compliance Psychotherapy/counseling: no  Previous psychiatric medications: wellbutrin Depressed mood: no Anxious mood: yes Anhedonia: no Significant weight loss or gain: no Insomnia: no  Fatigue: yes Feelings of worthlessness or guilt: no Impaired concentration/indecisiveness: no Suicidal ideations: no Hopelessness: no Crying spells: no Depression screen [facility] 2/9 10/22/2019 09/10/2019 06/09/2019 12/02/2018 04/07/2018  Decreased Interest 0 1 0 0 0  Down, Depressed, Hopeless 0 1 0 0 0  PHQ - 2 Score 0 2 0 0 0  Altered sleeping 0 1 0 0 0  Tired,  decreased energy 0 1 0 0 0  Change in appetite 0 1 0 0 0  Feeling bad or failure about yourself  0 1 0 0 0  Trouble concentrating 0 0 0 0 0  Moving slowly or fidgety/restless 0 0 0 0 0  Suicidal thoughts 0 0 0 0 0  PHQ-9 Score 0 6 0 0 0  Difficult doing work/chores - Somewhat difficult Not difficult at all Not difficult at all -  Some recent data might be hidden   GAD 7 : Generalized Anxiety Score 10/22/2019 08/08/2016  Nervous, Anxious, on Edge 0 1  Control/stop worrying 0 0  Worry too much - different things 0 0  Trouble relaxing 0 2  Restless 0 1  Easily annoyed or irritable 0 2  Afraid - awful might happen 0 0  Total GAD 7 Score 0 6  Anxiety Difficulty - Somewhat difficult    Relevant past medical, surgical, family and social history reviewed and updated as indicated. Interim medical history since our last visit reviewed. Allergies and medications reviewed and updated.  Review of Systems  Constitutional: Negative.   Respiratory: Negative.   Cardiovascular: Negative.   Gastrointestinal: Negative.   Musculoskeletal: Negative.   Psychiatric/Behavioral: Negative for agitation, behavioral problems, confusion, decreased concentration, dysphoric mood, hallucinations, self-injury, sleep disturbance and suicidal ideas. The patient is nervous/anxious. The patient is not hyperactive.     Per HPI unless specifically indicated above     Objective:    BP 128/68 (BP Location: Left Arm, Cuff Size: Large)   Pulse 85   Temp 98.1 F (36.7 C) (Oral)   Wt  199 lb 6.4 oz (90.4 kg)   SpO2 96%   BMI 42.58 kg/m   Wt Readings from Last 3 Encounters:  10/22/19 199 lb 6.4 oz (90.4 kg)  09/17/19 202 lb 12.8 oz (92 kg)  09/09/19 205 lb 12.8 oz (93.4 kg)    Physical Exam Vitals and nursing note reviewed.  Constitutional:      General: She is not in acute distress.    Appearance: Normal appearance. She is not ill-appearing, toxic-appearing or diaphoretic.  HENT:     Head: Normocephalic and  atraumatic.     Right Ear: External ear normal.     Left Ear: External ear normal.     Nose: Nose normal.     Mouth/Throat:     Mouth: Mucous membranes are moist.     Pharynx: Oropharynx is clear.  Eyes:     General: No scleral icterus.       Right eye: No discharge.        Left eye: No discharge.     Extraocular Movements: Extraocular movements intact.     Conjunctiva/sclera: Conjunctivae normal.     Pupils: Pupils are equal, round, and reactive to light.  Cardiovascular:     Rate and Rhythm: Normal rate and regular rhythm.     Pulses: Normal pulses.     Heart sounds: Normal heart sounds. No murmur heard.  No friction rub. No gallop.   Pulmonary:     Effort: Pulmonary effort is normal. No respiratory distress.     Breath sounds: Normal breath sounds. No stridor. No wheezing, rhonchi or rales.  Chest:     Chest wall: No tenderness.  Musculoskeletal:        General: Normal range of motion.     Cervical back: Normal range of motion and neck supple.  Skin:    General: Skin is warm and dry.     Capillary Refill: Capillary refill takes less than 2 seconds.     Coloration: Skin is not jaundiced or pale.     Findings: No bruising, erythema, lesion or rash.  Neurological:     General: No focal deficit present.     Mental Status: She is alert and oriented to person, place, and time. Mental status is at baseline.  Psychiatric:        Mood and Affect: Mood normal.        Behavior: Behavior normal.        Thought Content: Thought content normal.        Judgment: Judgment normal.     Results for orders placed or performed in visit on 09/09/19  Bayer DCA Hb A1c Waived (STAT)  Result Value Ref Range   HB A1C (BAYER DCA - WAIVED) 5.8 <7.0 %  B Nat Peptide  Result Value Ref Range   BNP 10.7 0.0 - 100.0 pg/mL  Thyroid Panel With TSH  Result Value Ref Range   TSH 2.150 0.450 - 4.500 uIU/mL   T4, Total 7.1 4.5 - 12.0 ug/dL   T3 Uptake Ratio 25 24 - 39 %   Free Thyroxine Index 1.8  1.2 - 4.9  Comprehensive metabolic panel  Result Value Ref Range   Glucose 100 (H) 65 - 99 mg/dL   BUN 18 8 - 27 mg/dL   Creatinine, Ser 0.16 0.57 - 1.00 mg/dL   GFR calc non Af Amer 86 >59 mL/min/1.73   GFR calc Af Amer 99 >59 mL/min/1.73   BUN/Creatinine Ratio 24 12 - 28   Sodium 139  134 - 144 mmol/L   Potassium 3.9 3.5 - 5.2 mmol/L   Chloride 99 96 - 106 mmol/L   CO2 25 20 - 29 mmol/L   Calcium 9.6 8.7 - 10.3 mg/dL   Total Protein 7.2 6.0 - 8.5 g/dL   Albumin 4.8 3.8 - 4.8 g/dL   Globulin, Total 2.4 1.5 - 4.5 g/dL   Albumin/Globulin Ratio 2.0 1.2 - 2.2   Bilirubin Total 0.4 0.0 - 1.2 mg/dL   Alkaline Phosphatase 81 48 - 121 IU/L   AST 19 0 - 40 IU/L   ALT 22 0 - 32 IU/L      Assessment & Plan:   Problem List Items Addressed This Visit      Cardiovascular and Mediastinum   Essential hypertension - Primary    Under good control on current regimen. Continue current regimen. Continue to monitor. Call with any concerns. Refills given. Labs drawn today.       Relevant Orders   Basic metabolic panel     Other   Depression, recurrent (HCC)    In mild exacerbation due to her son's illness. Will refill lorazepam. Otherwise stable. Continue current regimen. Continue to monitor. Call with any concerns.       Relevant Medications   LORazepam (ATIVAN) 1 MG tablet   buPROPion (WELLBUTRIN SR) 150 MG 12 hr tablet   BMI 40.0-44.9, adult (HCC)    Congratulated patient on 7lb weight loss. Tolerating her trulicity well. Will increase to 1.5mg  weekly and recheck at physical. Call with any concerns.       Relevant Medications   Dulaglutide (TRULICITY) 1.5 MG/0.5ML SOPN    Other Visit Diagnoses    Recurrent major depressive disorder, in full remission (HCC)       Relevant Medications   LORazepam (ATIVAN) 1 MG tablet   buPROPion (WELLBUTRIN SR) 150 MG 12 hr tablet       Follow up plan: Return November, for welcome to medicare.

## 2019-10-23 LAB — BASIC METABOLIC PANEL
BUN/Creatinine Ratio: 12 (ref 12–28)
BUN: 11 mg/dL (ref 8–27)
CO2: 24 mmol/L (ref 20–29)
Calcium: 9.6 mg/dL (ref 8.7–10.3)
Chloride: 99 mmol/L (ref 96–106)
Creatinine, Ser: 0.89 mg/dL (ref 0.57–1.00)
GFR calc Af Amer: 79 mL/min/{1.73_m2} (ref >59)
GFR calc non Af Amer: 69 mL/min/{1.73_m2} (ref >59)
Glucose: 99 mg/dL (ref 65–99)
Potassium: 4 mmol/L (ref 3.5–5.2)
Sodium: 139 mmol/L (ref 134–144)

## 2019-10-28 ENCOUNTER — Other Ambulatory Visit: Payer: Self-pay | Admitting: Family Medicine

## 2019-10-28 NOTE — Telephone Encounter (Signed)
Revonda Standard from pharmacy called wanting to confirm pt needed to be on both prescriptions due to the fact they both do the same thing.... Chlorthalidone and hydrochlorothiazide...please advise

## 2019-10-28 NOTE — Telephone Encounter (Signed)
Routing to provider  

## 2019-10-28 NOTE — Telephone Encounter (Signed)
Requested medication (s) are due for refill today: no  Requested medication (s) are on the active medication list:  yes  Last refill:  09/28/2019  Future visit scheduled: yes  Notes to clinic:  this refill cannot be delegated    Requested Prescriptions  Pending Prescriptions Disp Refills   Vitamin D, Ergocalciferol, (DRISDOL) 1.25 MG (50000 UNIT) CAPS capsule [Pharmacy Med Name: VITAMIN D (ERGOCALCIFEROL) 1.25 MG] 12 capsule 0    Sig: TAKE 1 CAPSULE EVERY 7 DAYS      Endocrinology:  Vitamins - Vitamin D Supplementation Failed - 10/28/2019  1:20 PM      Failed - 50,000 IU strengths are not delegated      Failed - Phosphate in normal range and within 360 days    No results found for: PHOS        Passed - Ca in normal range and within 360 days    Calcium  Date Value Ref Range Status  10/22/2019 9.6 8.7 - 10.3 mg/dL Final   Calcium, Total  Date Value Ref Range Status  08/30/2013 9.1 8.5 - 10.1 mg/dL Final          Passed - Vitamin D in normal range and within 360 days    Vit D, 25-Hydroxy  Date Value Ref Range Status  06/16/2019 69.0 30.0 - 100.0 ng/mL Final    Comment:    Vitamin D deficiency has been defined by the Institute of Medicine and an Endocrine Society practice guideline as a level of serum 25-OH vitamin D less than 20 ng/mL (1,2). The Endocrine Society went on to further define vitamin D insufficiency as a level between 21 and 29 ng/mL (2). 1. IOM (Institute of Medicine). 2010. Dietary reference    intakes for calcium and D. [place]: The    [employer]. 2. Holick MF, Binkley [place], Bischoff-Ferrari HA, et al.    Evaluation, treatment, and prevention of vitamin D    deficiency: an Endocrine Society clinical practice    guideline. JCEM. 2011 Jul; 96(7):1911-30.           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

## 2019-11-01 ENCOUNTER — Encounter: Payer: Self-pay | Admitting: Family Medicine

## 2019-11-02 ENCOUNTER — Ambulatory Visit (INDEPENDENT_AMBULATORY_CARE_PROVIDER_SITE_OTHER): Payer: 59 | Admitting: Nurse Practitioner

## 2019-11-02 ENCOUNTER — Other Ambulatory Visit: Payer: Self-pay

## 2019-11-02 ENCOUNTER — Encounter: Payer: Self-pay | Admitting: Nurse Practitioner

## 2019-11-02 VITALS — BP 118/76 | HR 79 | Temp 98.4°F | Wt 201.8 lb

## 2019-11-02 DIAGNOSIS — R82998 Other abnormal findings in urine: Secondary | ICD-10-CM | POA: Insufficient documentation

## 2019-11-02 DIAGNOSIS — R300 Dysuria: Secondary | ICD-10-CM | POA: Diagnosis not present

## 2019-11-02 MED ORDER — PHENAZOPYRIDINE HCL 97.2 MG PO TABS
97.0000 mg | ORAL_TABLET | Freq: Three times a day (TID) | ORAL | 0 refills | Status: DC | PRN
Start: 2019-11-02 — End: 2020-02-23

## 2019-11-02 MED ORDER — SULFAMETHOXAZOLE-TRIMETHOPRIM 800-160 MG PO TABS
1.0000 | ORAL_TABLET | Freq: Two times a day (BID) | ORAL | 0 refills | Status: AC
Start: 2019-11-02 — End: 2019-11-07

## 2019-11-02 NOTE — Progress Notes (Signed)
BP 118/76    Pulse 79    Temp 98.4 F (36.9 C) (Oral)    Wt 201 lb 12.8 oz (91.5 kg)    SpO2 98%    BMI 43.09 kg/m    Subjective:    Patient ID: [name], female    DOB: [birth year], 64 y.o.   MRN: [id number]  HPI: [name] is a 63 y.o. female presenting for UTI symptoms.  Chief Complaint  Patient presents with   Urinary Tract Infection    pt states she has burning with urination and urinary urgency since Saturday    URINARY SYMPTOMS Patient reports symptoms started Saturday Dysuria: yes Urinary frequency: yes Urgency: yes Small volume voids: yes Symptom severity: severe Urinary incontinence: no Foul odor: no Hematuria: yes; usually comes when she has a UTI Abdominal pain: no Back pain: yes - left side Suprapubic pain/pressure: yes Flank pain: yes Fever:  No Nausea: yes - Sunday Vomiting: yes - Sunday Relief with cranberry juice: yes Relief with pyridium: yes Status: worse Previous urinary tract infection: yes Recurrent urinary tract infection: no Sexual activity: not currently History of sexually transmitted disease: no Treatments attempted: increased water, Tylenol, pyridium, cranberry juice   Allergies  Allergen Reactions   Cymbalta [Duloxetine Hcl] Other (See Comments)    Hallucinations   Amoxicillin Hives   Outpatient Encounter Medications as of 11/02/2019  Medication Sig   albuterol (PROAIR HFA) 108 (90 Base) MCG/ACT inhaler Inhale 2 puffs into the lungs every 6 (six) hours as needed for wheezing or shortness of breath.   buPROPion (WELLBUTRIN SR) 150 MG 12 hr tablet Take 1 tablet (150 mg total) by mouth 3 (three) times daily.   Cholecalciferol 25 MCG (1000 UT) capsule Take 1 capsule (1,000 Units total) by mouth daily.   cyclobenzaprine (FLEXERIL) 5 MG tablet TAKE ONE TABLET BY MOUTH 3 TIMES DAILY AS NEEDED FOR MUSCLE SPASM   Dulaglutide (TRULICITY) 1.5 MG/0.5ML SOPN Inject 0.5 mLs (1.5 mg total) into the skin once a week.    fluticasone (FLONASE) 50 MCG/ACT nasal spray Place 2 sprays into both nostrils 2 (two) times daily.   Fluticasone-Umeclidin-Vilant (TRELEGY ELLIPTA) 100-62.5-25 MCG/INH AEPB Inhale 1 puff into the lungs daily.   hydrochlorothiazide (HYDRODIURIL) 50 MG tablet Take 1 tablet (50 mg total) by mouth daily.   hyoscyamine (LEVSIN SL) 0.125 MG SL tablet TAKE ONE TABLET BY MOUTH EVERY 4 HOURS AS NEEDED   LORazepam (ATIVAN) 1 MG tablet TAKE 1/2 TO 1 TABLET AS NEEDED FOR ANXIETY   meloxicam (MOBIC) 15 MG tablet Take 1 tablet (15 mg total) by mouth daily.   MYRBETRIQ 50 MG TB24 tablet Take 1 tablet (50 mg total) by mouth daily.   omeprazole (PRILOSEC) 40 MG capsule Take 1 capsule (40 mg total) by mouth daily.   Vitamin D, Ergocalciferol, (DRISDOL) 1.25 MG (50000 UNIT) CAPS capsule TAKE 1 CAPSULE EVERY 7 DAYS   [DISCONTINUED] diphenoxylate-atropine (LOMOTIL) 2.5-0.025 MG tablet TAKE 1 TABLET BY MOUTH 4 TIMES DAILY AS NEEDED FOR DIARRHEA OR LOOSE STOOLS   atorvastatin (LIPITOR) 10 MG tablet Take 1 tablet (10 mg total) by mouth once a week. (Patient not taking: Reported on 11/02/2019)   phenazopyridine (PYRIDIUM) 97 MG tablet Take 1 tablet (97 mg total) by mouth 3 (three) times daily as needed for pain.   sulfamethoxazole-trimethoprim (BACTRIM DS) 800-160 MG tablet Take 1 tablet by mouth 2 (two) times daily for 5 days.   No facility-administered encounter medications on file as of 11/02/2019.  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 Interim medical history since our last visit reviewed.  Review of Systems  Constitutional: Negative.  Negative for activity change, appetite change, fatigue and fever.  Gastrointestinal: Negative.  Negative for abdominal pain.  Genitourinary: Positive for decreased urine volume, dysuria, flank pain, frequency, hematuria and urgency.  Skin: Negative.   Neurological: Negative.   Psychiatric/Behavioral: Negative.     Per HPI unless specifically indicated above     Objective:    BP 118/76    Pulse 79    Temp 98.4 F (36.9 C) (Oral)    Wt 201 lb 12.8 oz (91.5 kg)    SpO2 98%    BMI 43.09 kg/m   Wt Readings from Last 3 Encounters:  11/02/19 201 lb 12.8 oz (91.5 kg)  10/22/19 199 lb 6.4 oz (90.4 kg)  09/17/19 202 lb 12.8 oz (92 kg)    Physical Exam Vitals and nursing note reviewed.  Constitutional:      General: She is not in acute distress.    Appearance: Normal appearance. She is not toxic-appearing.     Comments: Uncomfortable-appearing  Abdominal:     General: Abdomen is flat. Bowel sounds are normal. There is no distension.     Palpations: Abdomen is soft.     Tenderness: There is abdominal tenderness (left lower quadrant). There is no right CVA tenderness, left CVA tenderness, guarding or rebound.  Skin:    General: Skin is warm and dry.     Coloration: Skin is not jaundiced or pale.  Neurological:     General: No focal deficit present.     Mental Status: She is alert and oriented to person, place, and time.     Motor: No weakness.     Gait: Gait  normal.  Psychiatric:        Mood and Affect: Mood normal.        Behavior: Behavior normal.        Thought Content: Thought content normal.        Judgment: Judgment normal.        Assessment & Plan:   Problem List Items Addressed This Visit      Other   Leukocytes in urine    Acute, ongoing.  UA with 3+ blood, 1+ leukocytes, and protein.  Urine culture sent, will follow.  In meantime, will treat with Bactrim DS bid x 5 days.  Prescription also given for pyridium for bladder pain.  Return to clinic or call Friday if symptoms not better.         Other Visit Diagnoses  Burning with urination    -  Primary   Relevant Orders   UA/M w/rflx Culture, Routine       Follow up plan: Return if symptoms worsen or fail to improve.

## 2019-11-02 NOTE — Patient Instructions (Signed)
Urinary Tract Infection, Adult A urinary tract infection (UTI) is an infection of any part of the urinary tract. The urinary tract includes:  The kidneys.  The ureters.  The bladder.  The urethra. These organs make, store, and get rid of pee (urine) in the body. What are the causes? This is caused by germs (bacteria) in your genital area. These germs grow and cause swelling (inflammation) of your urinary tract. What increases the risk? You are more likely to develop this condition if:  You have a small, thin tube (catheter) to drain pee.  You cannot control when you pee or poop (incontinence).  You are female, and: ? You use these methods to prevent pregnancy:  A medicine that kills sperm (spermicide).  A device that blocks sperm (diaphragm). ? You have low levels of a female hormone (estrogen). ? You are pregnant.  You have genes that add to your risk.  You are sexually active.  You take antibiotic medicines.  You have trouble peeing because of: ? A prostate that is bigger than normal, if you are female. ? A blockage in the part of your body that drains pee from the bladder (urethra). ? A kidney stone. ? A nerve condition that affects your bladder (neurogenic bladder). ? Not getting enough to drink. ? Not peeing often enough.  You have other conditions, such as: ? Diabetes. ? A weak disease-fighting system (immune system). ? Sickle cell disease. ? Gout. ? Injury of the spine. What are the signs or symptoms? Symptoms of this condition include:  Needing to pee right away (urgently).  Peeing often.  Peeing small amounts often.  Pain or burning when peeing.  Blood in the pee.  Pee that smells bad or not like normal.  Trouble peeing.  Pee that is cloudy.  Fluid coming from the vagina, if you are female.  Pain in the belly or lower back. Other symptoms include:  Throwing up (vomiting).  No urge to eat.  Feeling mixed up (confused).  Being tired  and grouchy (irritable).  A fever.  Watery poop (diarrhea). How is this treated? This condition may be treated with:  Antibiotic medicine.  Other medicines.  Drinking enough water. Follow these instructions at home:  Medicines  Take over-the-counter and prescription medicines only as told by your doctor.  If you were prescribed an antibiotic medicine, take it as told by your doctor. Do not stop taking it even if you start to feel better. General instructions  Make sure you: ? Pee until your bladder is empty. ? Do not hold pee for a long time. ? Empty your bladder after sex. ? Wipe from front to back after pooping if you are a female. Use each tissue one time when you wipe.  Drink enough fluid to keep your pee pale yellow.  Keep all follow-up visits as told by your doctor. This is important. Contact a doctor if:  You do not get better after 1-2 days.  Your symptoms go away and then come back. Get help right away if:  You have very bad back pain.  You have very bad pain in your lower belly.  You have a fever.  You are sick to your stomach (nauseous).  You are throwing up. Summary  A urinary tract infection (UTI) is an infection of any part of the urinary tract.  This condition is caused by germs in your genital area.  There are many risk factors for a UTI. These include having a small, thin   tube to drain pee and not being able to control when you pee or poop.  Treatment includes antibiotic medicines for germs.  Drink enough fluid to keep your pee pale yellow. This information is not intended to replace advice given to you by your health care provider. Make sure you discuss any questions you have with your health care provider. Document Revised: 03/26/2018 Document Reviewed: 10/16/2017 Elsevier Patient Education  2020 [employer]  

## 2019-11-02 NOTE — Assessment & Plan Note (Signed)
Acute, ongoing.  UA with 3+ blood, 1+ leukocytes, and protein.  Urine culture sent, will follow.  In meantime, will treat with Bactrim DS bid x 5 days.  Prescription also given for pyridium for bladder pain.  Return to clinic or call Friday if symptoms not better.

## 2019-11-07 LAB — UA/M W/RFLX CULTURE, ROUTINE
Bilirubin, UA: NEGATIVE
Glucose, UA: NEGATIVE
Ketones, UA: NEGATIVE
Nitrite, UA: NEGATIVE
Specific Gravity, UA: 1.025 (ref 1.005–1.030)
Urobilinogen, Ur: 0.2 mg/dL (ref 0.2–1.0)
pH, UA: 6 (ref 5.0–7.5)

## 2019-11-07 LAB — MICROSCOPIC EXAMINATION: WBC, UA: >30 /hpf — AB (ref 0–5)

## 2019-11-07 LAB — URINE CULTURE, REFLEX

## 2019-11-08 ENCOUNTER — Encounter: Payer: Self-pay | Admitting: Family Medicine

## 2019-11-10 ENCOUNTER — Encounter: Payer: Self-pay | Admitting: Family Medicine

## 2019-11-16 ENCOUNTER — Telehealth (INDEPENDENT_AMBULATORY_CARE_PROVIDER_SITE_OTHER): Payer: 59 | Admitting: Nurse Practitioner

## 2019-11-16 ENCOUNTER — Encounter: Payer: Self-pay | Admitting: Family Medicine

## 2019-11-16 ENCOUNTER — Encounter: Payer: Self-pay | Admitting: Nurse Practitioner

## 2019-11-16 VITALS — Temp 101.9°F

## 2019-11-16 DIAGNOSIS — J069 Acute upper respiratory infection, unspecified: Secondary | ICD-10-CM

## 2019-11-16 MED ORDER — BENZONATATE 100 MG PO CAPS
100.0000 mg | ORAL_CAPSULE | Freq: Three times a day (TID) | ORAL | 0 refills | Status: DC | PRN
Start: 2019-11-16 — End: 2020-04-26

## 2019-11-16 MED ORDER — PREDNISONE 10 MG PO TABS: ORAL_TABLET | ORAL | 0 refills | Status: DC

## 2019-11-16 MED ORDER — GUAIFENESIN ER 600 MG PO TB12: 600.0000 mg | ORAL_TABLET | Freq: Two times a day (BID) | ORAL | 0 refills | Status: DC | PRN

## 2019-11-16 NOTE — Assessment & Plan Note (Signed)
Acute x one day, ongoing.  Likely due to viral illness at this point, recommended COVID testing to rule out.  Continue supportive care and symptomatic management: push hydration, antipyretics for fever, guaifenesin and tessalon for cough.  Will also start prednisone taper given shortness of breath and increased rescue inhaler use.  Had long discussion regarding antibiotics and that they are not indicated with viral illness and would not shorten the length of her illness.  Patient to remain out of work until fever free x 48 hours and symptoms improving.  Follow up with [place] later this week or early next week if symptoms are not improving.  With any chest pain or sudden onset of shortness of breath, go to ER.

## 2019-11-16 NOTE — Progress Notes (Signed)
Temp (!) 101.9 F (38.8 C)    Subjective:    Patient ID: [name], female    DOB: [birth year], 64 y.o.   MRN: [id number]  HPI: [name] is a 64 y.o. female presenting with a cough, congestion, and fever x 1 day  Chief Complaint  Patient presents with  . URI    pt states she has had a cough, congestion, fever, and pressure since yesterday. States symptoms came on quick and got worse quick   UPPER RESPIRATORY TRACT INFECTION Onset: 1 day ago Worst symptom: cough and green sputum Fever: yes; 102.2 Cough: yes - prodcutive with green sputum Shortness of breath: yes; using inhaler 3x today and 1 breathing treatment today Wheezing: yes Chest pain: no  Chest tightness: yes Chest congestion: yes Nasal congestion: no Runny nose: no Post nasal drip: no Sneezing: no Sore throat: no Swollen glands: yes; a little tender Sinus pressure: no Headache: no Face pain: no Toothache: no Ear pain: no  Ear pressure: no  Eyes red/itching:no Eye drainage/crusting: no  Nausea/Vomiting: no  Appetite: not eating much but has been drinking ginger ale and water with lemon and lime Diarrhea: no Rash: no Fatigue: yes Sick contacts: no Strep contacts: no  Context: same Recurrent sinusitis: no Relief with OTC cold/cough medications: no  Treatments attempted: breathing treatments, inhaler, Tylenol, OTC cough medicine   Allergies  Allergen Reactions  . Cymbalta [Duloxetine Hcl] Other (See Comments)    Hallucinations  . Amoxicillin Hives   Outpatient Encounter Medications as of 11/16/2019  Medication Sig  . albuterol (PROAIR HFA) 108 (90 Base) MCG/ACT inhaler Inhale 2 puffs into the lungs every 6 (six) hours as needed for wheezing or shortness of breath.  Marland Kitchen buPROPion (WELLBUTRIN SR) 150 MG 12 hr tablet Take 1 tablet (150 mg total) by mouth 3 (three) times daily.  . Cholecalciferol 25 MCG (1000 UT) capsule Take 1 capsule (1,000 Units total) by mouth daily.  .  cyclobenzaprine (FLEXERIL) 5 MG tablet TAKE ONE TABLET BY MOUTH 3 TIMES DAILY AS NEEDED FOR MUSCLE SPASM  . Dulaglutide (TRULICITY) 1.5 MG/0.5ML SOPN Inject 0.5 mLs (1.5 mg total) into the skin once a week.  . fluticasone (FLONASE) 50 MCG/ACT nasal spray Place 2 sprays into both nostrils 2 (two) times daily.  . Fluticasone-Umeclidin-Vilant (TRELEGY ELLIPTA) 100-62.5-25 MCG/INH AEPB Inhale 1 puff into the lungs daily.  . hydrochlorothiazide (HYDRODIURIL) 50 MG tablet Take 1 tablet (50 mg total) by mouth daily.  . hyoscyamine (LEVSIN SL) 0.125 MG SL tablet TAKE ONE TABLET BY MOUTH EVERY 4 HOURS AS NEEDED  . LORazepam (ATIVAN) 1 MG tablet TAKE 1/2 TO 1 TABLET AS NEEDED FOR ANXIETY  . meloxicam (MOBIC) 15 MG tablet Take 1 tablet (15 mg total) by mouth daily.  Marland Kitchen MYRBETRIQ 50 MG TB24 tablet Take 1 tablet (50 mg total) by mouth daily.  Marland Kitchen omeprazole (PRILOSEC) 40 MG capsule Take 1 capsule (40 mg total) by mouth daily.  . phenazopyridine (PYRIDIUM) 97 MG tablet Take 1 tablet (97 mg total) by mouth 3 (three) times daily as needed for pain.  . Vitamin D, Ergocalciferol, (DRISDOL) 1.25 MG (50000 UNIT) CAPS capsule TAKE 1 CAPSULE EVERY 7 DAYS  . atorvastatin (LIPITOR) 10 MG tablet Take 1 tablet (10 mg total) by mouth once a week. (Patient not taking: Reported on 11/02/2019)  . benzonatate (TESSALON) 100 MG capsule Take 1 capsule (100 mg total) by mouth 3 (three) times daily as needed for cough.  Marland Kitchen guaiFENesin (MUCINEX) 600  MG 12 hr tablet Take 1 tablet (600 mg total) by mouth 2 (two) times daily as needed for cough or to loosen phlegm.  . predniSONE (DELTASONE) 10 MG tablet Take 6 tablets by mouth daily for 1 day, then reduce by 1 tablet every day until gone   No facility-administered encounter medications on file as of 11/16/2019.   Patient Active Problem List   Diagnosis Date Noted  . Leukocytes in urine 11/02/2019  . Vitamin D deficiency 12/03/2018  . Knee arthropathy 07/28/2018  . Upper respiratory  tract infection 05/26/2018  . Anxiety 04/07/2018  . Hyperlipidemia associated with type 2 diabetes mellitus (HCC) 04/07/2018  . Mixed stress and urge urinary incontinence 02/25/2017  . Right sided weakness 09/26/2016  . Asthma 11/15/2015  . Low back pain 09/19/2015  . Essential hypertension 10/20/2014  . 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 Celiac sprue 10/20/2014  . Depression, recurrent (HCC) 10/20/2014  . BMI 40.0-44.9, adult (HCC) 10/20/2014  . Fatty infiltration of liver 08/31/2014   Past Medical History:  Diagnosis Date  . Asthma   . Depression   . Diabetes mellitus without complication (HCC)   . Hyperlipidemia   . Kidney stones   . Migraine headache    Relevant past medical, surgical, family and social history reviewed and updated as indicated. Interim medical history since our last visit reviewed.  Review of Systems  Constitutional: Positive for appetite change, fatigue and fever.  HENT: Positive for congestion and sore throat. Negative for ear discharge, ear pain, facial swelling, hearing loss, postnasal drip, rhinorrhea, sinus pressure, sinus pain, sneezing and trouble swallowing.   Respiratory: Positive for cough, chest tightness, shortness of breath and wheezing.   Cardiovascular: Negative.  Negative for chest pain and palpitations.  Gastrointestinal: Negative.  Negative for diarrhea, nausea and vomiting.  Musculoskeletal: Negative.   Skin: Negative.  Negative for rash.  Neurological: Negative.  Negative for dizziness and headaches.  Psychiatric/Behavioral: Negative.    Per HPI unless specifically indicated above     Objective:    Temp (!) 101.9 F (38.8 C)   Wt Readings from Last 3 Encounters:  11/02/19 201 lb 12.8 oz (91.5 kg)  10/22/19 199 lb 6.4 oz (90.4 kg)  09/17/19 202 lb 12.8 oz (92 kg)    Physical Exam Physical examination unable to be performed due to lack of equipment.  Results for orders placed or performed  in visit on 11/02/19  Microscopic Examination   Urine  Result Value Ref Range   WBC, UA >30 (A) 0 - 5 /hpf   RBC 11-30 (A) 0 - 2 /hpf   Epithelial Cells (non renal) 0-10 0 - 10 /hpf   Bacteria, UA Many (A) None seen/Few  Urine Culture, Reflex   Urine  Result Value Ref Range   Urine Culture, Routine Final report (A)    Organism ID, Bacteria Comment (A)    ORGANISM ID, BACTERIA Comment (A)    Antimicrobial Susceptibility Comment   UA/M w/rflx Culture, Routine   Specimen: Urine   Urine  Result Value Ref Range   Specific Gravity, UA 1.025 1.005 - 1.030   pH, UA 6.0 5.0 - 7.5   Color, UA Yellow Yellow   Appearance Ur Hazy (A) Clear   Leukocytes,UA 1+ (A) Negative   Protein,UA 1+ (A) Negative/Trace   Glucose, UA Negative Negative   Ketones, UA Negative Negative   RBC, UA 3+ (A) Negative   Bilirubin, UA Negative Negative   Urobilinogen, Ur 0.2  0.2 - 1.0 mg/dL   Nitrite, UA Negative Negative   Microscopic Examination See below:    Urinalysis Reflex Comment       Assessment & Plan:   Problem List Items Addressed This Visit      Respiratory   Upper respiratory tract infection - Primary    Acute x one day, ongoing.  Likely due to viral illness at this point, recommended COVID testing to rule out.  Continue supportive care and symptomatic management: push hydration, antipyretics for fever, guaifenesin and tessalon for cough.  Will also start prednisone taper given shortness of breath and increased rescue inhaler use.  Had long discussion regarding antibiotics and that they are not indicated with viral illness and would not shorten the length of her illness.  Patient to remain out of work until fever free x 48 hours and symptoms improving.  Follow up with [place] later this week or early next week if symptoms are not improving.  With any chest pain or sudden onset of shortness of breath, go to ER.           Follow up plan: No follow-ups on file.  This visit was completed via telephone  due to the restrictions of the COVID-19 pandemic. All issues as above were discussed and addressed but no physical exam was performed. If it was felt that the patient should be evaluated in the office, they were directed there. The patient verbally consented to this visit. Patient was unable to complete an audio/visual visit due to Technical difficulties,Lack of internet. . Location of the patient: home . Location of the provider: work . Those involved with this call:  . Provider: Mardene Celeste, DNP . CMA: Wilhemena Durie, CMA . Front Desk/Registration: [facility]  . Time spent on call: 17 minutes on the phone discussing health concerns. 25 minutes total spent in review of patient's record and preparation of their chart.  I verified patient identity using two factors (patient name and date of birth). Patient consents verbally to being seen via telemedicine visit today.

## 2019-11-19 ENCOUNTER — Emergency Department: Payer: 59

## 2019-11-19 ENCOUNTER — Other Ambulatory Visit: Payer: Self-pay

## 2019-11-19 ENCOUNTER — Encounter: Payer: Self-pay | Admitting: *Deleted

## 2019-11-19 ENCOUNTER — Emergency Department
Admission: EM | Admit: 2019-11-19 | Discharge: 2019-11-19 | Disposition: A | Discharge status: Home / Self Care | Payer: 59 | Attending: Emergency Medicine | Admitting: Emergency Medicine

## 2019-11-19 DIAGNOSIS — J45909 Unspecified asthma, uncomplicated: Secondary | ICD-10-CM | POA: Insufficient documentation

## 2019-11-19 DIAGNOSIS — J209 Acute bronchitis, unspecified: Secondary | ICD-10-CM | POA: Diagnosis not present

## 2019-11-19 DIAGNOSIS — Z20822 Contact with and (suspected) exposure to covid-19: Secondary | ICD-10-CM | POA: Diagnosis not present

## 2019-11-19 DIAGNOSIS — Z7951 Long term (current) use of inhaled steroids: Secondary | ICD-10-CM | POA: Diagnosis not present

## 2019-11-19 DIAGNOSIS — Z79899 Other long term (current) drug therapy: Secondary | ICD-10-CM | POA: Insufficient documentation

## 2019-11-19 DIAGNOSIS — R05 Cough: Secondary | ICD-10-CM | POA: Diagnosis present

## 2019-11-19 DIAGNOSIS — Z7984 Long term (current) use of oral hypoglycemic drugs: Secondary | ICD-10-CM | POA: Diagnosis not present

## 2019-11-19 DIAGNOSIS — E119 Type 2 diabetes mellitus without complications: Secondary | ICD-10-CM | POA: Diagnosis not present

## 2019-11-19 DIAGNOSIS — I10 Essential (primary) hypertension: Secondary | ICD-10-CM | POA: Insufficient documentation

## 2019-11-19 LAB — SARS CORONAVIRUS 2 BY RT PCR (HOSPITAL ORDER, PERFORMED IN ~~LOC~~ HOSPITAL LAB): SARS Coronavirus 2: NEGATIVE

## 2019-11-19 MED ORDER — DOXYCYCLINE HYCLATE 100 MG PO CAPS
100.0000 mg | ORAL_CAPSULE | Freq: Two times a day (BID) | ORAL | 0 refills | Status: DC
Start: 2019-11-19 — End: 2020-02-23

## 2019-11-19 MED ORDER — HYDROCOD POLST-CPM POLST ER 10-8 MG/5ML PO SUER: 5.0000 mL | Freq: Two times a day (BID) | ORAL | 0 refills | Status: DC | PRN

## 2019-11-19 NOTE — ED Triage Notes (Signed)
Pt reporting a video visit with PCP on Tuesday for a persistent cough. Pt was prescribed prednisone and Tessalon [place] with no relief. Pt to ED because the cough is keeping her awake and is getting worse. Pt is afebrile and reports cough is nonproductive.

## 2019-11-19 NOTE — ED Provider Notes (Signed)
[facility] Emergency Department Provider Note ____________________________________________   First MD Initiated Contact with Patient 11/19/19 [id number]     (approximate)  I have reviewed the triage vital signs and the nursing notes.   HISTORY  Chief Complaint Cough   HPI [name] is a 64 y.o. female presents to the ED with complaint of cough that began earlier this week.  Patient states that she saw her PCP 3 days ago and was placed on Tessalon Perles and prednisone for a nonproductive cough.  Patient has a history of bronchitis.  She denies any fever, chills, nausea, vomiting or diarrhea.  No known exposure to Covid.  Tessalon Perles do not seem to be working as patient is not able to sleep at night due to cough.  Patient is fully vaccinated.  Patient also reports that she is a non-smoker.  She rates her pain as 9 out of 10.      Past Medical History:  Diagnosis Date  . Asthma   . Depression   . Diabetes mellitus without complication ([place])   . Hyperlipidemia   . Kidney stones   . Migraine headache     Patient Active Problem List   Diagnosis Date Noted  . Leukocytes in urine 11/02/2019  . Vitamin D deficiency 12/03/2018  . Knee arthropathy 07/28/2018  . Upper respiratory tract infection 05/26/2018  . Anxiety 04/07/2018  . Hyperlipidemia associated with type 2 diabetes mellitus ([place]) 04/07/2018  . Mixed stress and urge urinary incontinence 02/25/2017  . Right sided weakness 09/26/2016  . Asthma 11/15/2015  . Low back pain 09/19/2015  . Essential hypertension 10/20/2014  . Diabetes mellitus associated with hormonal etiology ([place]) 10/20/2014  . Celiac sprue 10/20/2014  . Depression, recurrent ([place]) 10/20/2014  . BMI 40.0-44.9, adult ([place]) 10/20/2014  . Fatty infiltration of liver 08/31/2014    Past Surgical History:  Procedure Laterality Date  . ABDOMINAL HYSTERECTOMY    . BREAST CYST ASPIRATION Right 1981   benign  . BREAST CYST  ASPIRATION Right 1984   benign  . CHOLECYSTECTOMY    . COLONOSCOPY  March 2016  . FLEXIBLE BRONCHOSCOPY N/A 01/26/2018   Procedure: FLEXIBLE BRONCHOSCOPY;  Surgeon: Laverle Hobby, MD;  Location: [facility] ORS;  Service: Pulmonary;  Laterality: N/A;  . MENISCUS REPAIR Left 09/18/2018  . OOPHORECTOMY    . PARTIAL KNEE ARTHROPLASTY Left 02/2019    Prior to Admission medications   Medication Sig Start Date End Date Taking? Authorizing Provider  albuterol (PROAIR HFA) 108 (90 Base) MCG/ACT inhaler Inhale 2 puffs into the lungs every 6 (six) hours as needed for wheezing or shortness of breath. 10/13/18   Crissman, Jeannette How, MD  atorvastatin (LIPITOR) 10 MG tablet Take 1 tablet (10 mg total) by mouth once a week. Patient not taking: Reported on 11/02/2019 09/17/19   Park Liter P, DO  benzonatate (TESSALON) 100 MG capsule Take 1 capsule (100 mg total) by mouth 3 (three) times daily as needed for cough. 11/16/19   Carnella Guadalajara I, NP  buPROPion (WELLBUTRIN SR) 150 MG 12 hr tablet Take 1 tablet (150 mg total) by mouth 3 (three) times daily. 10/22/19   Johnson, Megan P, DO  chlorpheniramine-HYDROcodone (TUSSIONEX PENNKINETIC ER) 10-8 MG/5ML SUER Take 5 mLs by mouth every 12 (twelve) hours as needed for cough. 11/19/19   Johnn Hai, PA-C  Cholecalciferol 25 MCG (1000 UT) capsule Take 1 capsule (1,000 Units total) by mouth daily. 12/10/18   Venita Lick, NP  cyclobenzaprine (  FLEXERIL) 5 MG tablet TAKE ONE TABLET BY MOUTH 3 TIMES DAILY AS NEEDED FOR MUSCLE SPASM 10/13/18   Guadalupe Maple, MD  doxycycline (VIBRAMYCIN) 100 MG capsule Take 1 capsule (100 mg total) by mouth 2 (two) times daily. 11/19/19   Johnn Hai, PA-C  Dulaglutide (TRULICITY) 1.5 MG/8.6PY SOPN Inject 0.5 mLs (1.5 mg total) into the skin once a week. 10/22/19   Johnson, Megan P, DO  fluticasone (FLONASE) 50 MCG/ACT nasal spray Place 2 sprays into both nostrils 2 (two) times daily. 10/22/19   Johnson, Megan P, DO    Fluticasone-Umeclidin-Vilant (TRELEGY ELLIPTA) 100-62.5-25 MCG/INH AEPB Inhale 1 puff into the lungs daily. 10/22/19   Johnson, Megan P, DO  guaiFENesin (MUCINEX) 600 MG 12 hr tablet Take 1 tablet (600 mg total) by mouth 2 (two) times daily as needed for cough or to loosen phlegm. 11/16/19   Carnella Guadalajara I, NP  hydrochlorothiazide (HYDRODIURIL) 50 MG tablet Take 1 tablet (50 mg total) by mouth daily. 09/17/19   Johnson, Megan P, DO  hyoscyamine (LEVSIN SL) 0.125 MG SL tablet TAKE ONE TABLET BY MOUTH EVERY 4 HOURS AS NEEDED 10/22/19   Johnson, Megan P, DO  LORazepam (ATIVAN) 1 MG tablet TAKE 1/2 TO 1 TABLET AS NEEDED FOR ANXIETY 10/22/19   Johnson, Megan P, DO  meloxicam (MOBIC) 15 MG tablet Take 1 tablet (15 mg total) by mouth daily. 10/22/19   Johnson, Megan P, DO  MYRBETRIQ 50 MG TB24 tablet Take 1 tablet (50 mg total) by mouth daily. 10/22/19   Johnson, Megan P, DO  omeprazole (PRILOSEC) 40 MG capsule Take 1 capsule (40 mg total) by mouth daily. 10/22/19   Johnson, Megan P, DO  phenazopyridine (PYRIDIUM) 97 MG tablet Take 1 tablet (97 mg total) by mouth 3 (three) times daily as needed for pain. 11/02/19   Carnella Guadalajara I, NP  predniSONE (DELTASONE) 10 MG tablet Take 6 tablets by mouth daily for 1 day, then reduce by 1 tablet every day until gone 11/16/19   Carnella Guadalajara I, NP  Vitamin D, Ergocalciferol, (DRISDOL) 1.25 MG (50000 UNIT) CAPS capsule TAKE 1 CAPSULE EVERY 7 DAYS 10/28/19   Marnee Guarneri T, NP    Allergies Cymbalta [duloxetine hcl] and Amoxicillin  Family History  Problem Relation Age of Onset  . Hypertension Mother   . Cancer Mother        breast  . Arthritis Mother   . Alzheimer's disease Mother   . Parkinson's disease Mother   . Breast cancer Mother 46  . Hypertension Father   . Diabetes Father   . Kidney cancer Father   . Breast cancer Cousin 34  . Breast cancer Maternal Aunt   . Breast cancer Maternal Grandmother 44    Social History Social History   Tobacco Use  .  Smoking status: Never Smoker  . Smokeless tobacco: Never Used  Vaping Use  . Vaping Use: Never used  Substance Use Topics  . Alcohol use: Yes  . Drug use: No    Review of Systems Constitutional: No fever/chills Eyes: No visual changes. ENT: No sore throat. Cardiovascular: Denies chest pain. Respiratory: Denies shortness of breath.  Positive for nonproductive cough. Gastrointestinal: No abdominal pain.  No nausea, no vomiting.  No diarrhea.   Musculoskeletal: Negative for muscle or joint aches. Skin: Negative for rash. Neurological: Negative for headaches, focal weakness or numbness.  ____________________________________________   PHYSICAL EXAM:  VITAL SIGNS: ED Triage Vitals  Enc Vitals Group  BP 11/19/19 0225 (!) 136/87     Pulse Rate 11/19/19 0225 71     Resp 11/19/19 0225 16     Temp 11/19/19 0225 97.7 F (36.5 C)     Temp Source 11/19/19 0225 Oral     SpO2 11/19/19 0225 97 %     Weight 11/19/19 0226 192 lb (87.1 kg)     Height 11/19/19 0226 5' (1.524 m)     Head Circumference --      Peak Flow --      Pain Score 11/19/19 0225 9     Pain Loc --      Pain Edu? --      Excl. in [place]? --     Constitutional: Alert and oriented. Well appearing and in no acute distress. Eyes: Conjunctivae are normal. PERRL. EOMI. Head: Atraumatic. Nose: No congestion/rhinnorhea. Neck: No stridor.   Cardiovascular: Normal rate, regular rhythm. Grossly normal heart sounds.  Good peripheral circulation. Respiratory: Normal respiratory effort.  No retractions. Lungs CTAB. Gastrointestinal: Soft and nontender. No distention. Musculoskeletal: Moves upper and lower extremities without any difficulty.  Patient is ambulatory without assistance. Neurologic:  Normal speech and language. No gross focal neurologic deficits are appreciated. No gait instability. Skin:  Skin is warm, dry and intact. No rash noted. Psychiatric: Mood and affect are normal. Speech and behavior are  normal.  ____________________________________________   LABS (all labs ordered are listed, but only abnormal results are displayed)  Labs Reviewed  SARS CORONAVIRUS 2 BY RT PCR (HOSPITAL ORDER, [facility])    RADIOLOGY   Official radiology report(s): DG Chest 2 View  Result Date: 11/19/2019 CLINICAL DATA:  Cough EXAM: CHEST - 2 VIEW COMPARISON:  January 19, 2018 FINDINGS: The heart size and mediastinal contours are within normal limits. Mild perihilar peribronchial cuffing is seen. The visualized skeletal structures are unremarkable. IMPRESSION: Findings which could be suggestive of bronchitis or reactive airway disease. Electronically Signed   By: Prudencio Pair M.D.   On: 11/19/2019 02:57    ____________________________________________   PROCEDURES  Procedure(s) performed (including Critical Care):  Procedures   ____________________________________________   INITIAL IMPRESSION / ASSESSMENT AND PLAN / ED COURSE  As part of my medical decision making, I reviewed the following data within the electronic MEDICAL RECORD NUMBER Notes from prior ED visits and [place] Controlled Substance Database  ALAA MULLALLY was evaluated in Emergency Department on 11/19/2019 for the symptoms described in the history of present illness. She was evaluated in the context of the global COVID-19 pandemic, which necessitated consideration that the patient might be at risk for infection with the SARS-CoV-2 virus that causes COVID-19. Institutional protocols and algorithms that pertain to the evaluation of patients at risk for COVID-19 are in a state of rapid change based on information released by regulatory bodies including the CDC and federal and state organizations. These policies and algorithms were followed during the patient's care in the ED.  64 year old female presents to the ED with history of 5 days of persistent nonproductive coughing.  Patient has no known Covid  exposure and has been fully vaccinated.  She does however have a history of bronchitis and is already taking prednisone and Tessalon Perles for her symptoms after seeing her PCP.  Chest x-ray today was suggestive of reactive airway disease/bronchitis.  Patient was given a prescription  for Tussionex and a prescription for doxycycline twice daily for the next 7 days.  Patient is aware not to go in direct  sunlight as she could blister while taking this antibiotic.  O2 sat was 97% and patient was afebrile.  Patient is to follow-up with her PCP if any continued problems.  She is also aware that if her Covid test is positive she will need to quarantine additional days. ____________________________________________   FINAL CLINICAL IMPRESSION(S) / ED DIAGNOSES  Final diagnoses:  Acute bronchitis, unspecified organism     ED Discharge Orders         Ordered    chlorpheniramine-HYDROcodone (TUSSIONEX PENNKINETIC ER) 10-8 MG/5ML SUER  Every 12 hours PRN     Discontinue  Reprint     11/19/19 0746    doxycycline (VIBRAMYCIN) 100 MG capsule  2 times daily     Discontinue  Reprint     11/19/19 0746           Note:  This document was prepared using Dragon voice recognition software and may include unintentional dictation errors.   Johnn Hai, PA-C 11/19/19 1252    Duffy Bruce, MD 11/27/19 0001

## 2019-11-19 NOTE — ED Notes (Signed)
See triage note  Presents with cough  States she was seen and placed on prednisone and tessalon Perles on Tuesday  States conts to have dry cough   No fever

## 2019-11-19 NOTE — Discharge Instructions (Signed)
Follow up with your PCP if any continued problems.  Take medication as needed.  Return to ED if any worsening of your symptoms.  Look at my chart for Covid test results.

## 2019-11-26 ENCOUNTER — Other Ambulatory Visit: Payer: Self-pay | Admitting: Family Medicine

## 2019-11-26 NOTE — Telephone Encounter (Signed)
Requested  medications are  due for refill today yes  Requested medications are on the active medication list yes  Last refill 7/2  Last visit July 2021  Future visit scheduled Nov 2021  Notes to clinic Not Delegated

## 2019-11-29 NOTE — Telephone Encounter (Signed)
On review PDMP this was filled 11/26/19, will refuse this script

## 2019-12-02 ENCOUNTER — Encounter: Payer: Self-pay | Admitting: Emergency Medicine

## 2019-12-02 ENCOUNTER — Other Ambulatory Visit: Payer: Self-pay

## 2019-12-02 ENCOUNTER — Emergency Department
Admission: EM | Admit: 2019-12-02 | Discharge: 2019-12-02 | Disposition: A | Discharge status: Home / Self Care | Payer: 59 | Attending: Emergency Medicine | Admitting: Emergency Medicine

## 2019-12-02 ENCOUNTER — Emergency Department: Payer: 59

## 2019-12-02 DIAGNOSIS — S8002XA Contusion of left knee, initial encounter: Secondary | ICD-10-CM

## 2019-12-02 DIAGNOSIS — I10 Essential (primary) hypertension: Secondary | ICD-10-CM | POA: Diagnosis not present

## 2019-12-02 DIAGNOSIS — Y999 Unspecified external cause status: Secondary | ICD-10-CM | POA: Diagnosis not present

## 2019-12-02 DIAGNOSIS — E11618 Type 2 diabetes mellitus with other diabetic arthropathy: Secondary | ICD-10-CM | POA: Diagnosis not present

## 2019-12-02 DIAGNOSIS — Z79899 Other long term (current) drug therapy: Secondary | ICD-10-CM | POA: Insufficient documentation

## 2019-12-02 DIAGNOSIS — Z96652 Presence of left artificial knee joint: Secondary | ICD-10-CM | POA: Insufficient documentation

## 2019-12-02 DIAGNOSIS — Y9289 Other specified places as the place of occurrence of the external cause: Secondary | ICD-10-CM | POA: Insufficient documentation

## 2019-12-02 DIAGNOSIS — S80912A Unspecified superficial injury of left knee, initial encounter: Secondary | ICD-10-CM | POA: Diagnosis present

## 2019-12-02 DIAGNOSIS — Y9389 Activity, other specified: Secondary | ICD-10-CM | POA: Insufficient documentation

## 2019-12-02 DIAGNOSIS — J45909 Unspecified asthma, uncomplicated: Secondary | ICD-10-CM | POA: Diagnosis not present

## 2019-12-02 MED ORDER — MELOXICAM 15 MG PO TABS
15.0000 mg | ORAL_TABLET | Freq: Every day | ORAL | 0 refills | Status: DC
Start: 2019-12-02 — End: 2020-05-01
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
## 2019-12-14 ENCOUNTER — Ambulatory Visit
Admission: RE | Admit: 2019-12-14 | Discharge: 2019-12-14 | Disposition: A | Discharge status: Home / Self Care | Payer: 59 | Source: Ambulatory Visit | Attending: Family Medicine | Admitting: Family Medicine

## 2019-12-14 ENCOUNTER — Other Ambulatory Visit: Payer: Self-pay

## 2019-12-14 DIAGNOSIS — Z1231 Encounter for screening mammogram for malignant neoplasm of breast: Secondary | ICD-10-CM | POA: Diagnosis present

## 2019-12-16 ENCOUNTER — Encounter: Payer: Self-pay | Admitting: Family Medicine

## 2019-12-29 ENCOUNTER — Telehealth: Payer: Self-pay

## 2019-12-29 NOTE — Telephone Encounter (Signed)
LOV: 10/22/19 NOV: 02/23/20  Last filled 10/22/19 for 30 with 0 refills.

## 2020-01-06 ENCOUNTER — Other Ambulatory Visit: Payer: Self-pay | Admitting: Family Medicine

## 2020-01-06 MED ORDER — LORAZEPAM 1 MG PO TABS: ORAL_TABLET | ORAL | 0 refills | Status: DC

## 2020-01-24 ENCOUNTER — Other Ambulatory Visit: Payer: Self-pay | Admitting: Nurse Practitioner

## 2020-01-24 NOTE — Telephone Encounter (Signed)
Requested medication (s) are due for refill today: yes  Requested medication (s) are on the active medication list: yes  Last refill:  10/28/19  Future visit scheduled: yes  Notes to clinic:  med not delegated to NT   Requested Prescriptions  Pending Prescriptions Disp Refills   Vitamin D, Ergocalciferol, (DRISDOL) 1.25 MG (50000 UNIT) CAPS capsule [Pharmacy Med Name: VITAMIN D (ERGOCALCIFEROL) 1.25 MG] 12 capsule 0    Sig: TAKE 1 CAPSULE EVERY 7 DAYS      Endocrinology:  Vitamins - Vitamin D Supplementation Failed - 01/24/2020  2:48 PM      Failed - 50,000 IU strengths are not delegated      Failed - Phosphate in normal range and within 360 days    No results found for: PHOS        Passed - Ca in normal range and within 360 days    Calcium  Date Value Ref Range Status  10/22/2019 9.6 8.7 - 10.3 mg/dL Final   Calcium, Total  Date Value Ref Range Status  08/30/2013 9.1 8.5 - 10.1 mg/dL Final          Passed - Vitamin D in normal range and within 360 days    Vit D, 25-Hydroxy  Date Value Ref Range Status  06/16/2019 69.0 30.0 - 100.0 ng/mL Final    Comment:    Vitamin D deficiency has been defined by the Institute of Medicine and an Endocrine Society practice guideline as a level of serum 25-OH vitamin D less than 20 ng/mL (1,2). The Endocrine Society went on to further define vitamin D insufficiency as a level between 21 and 29 ng/mL (2). 1. IOM (Institute of Medicine). 2010. Dietary reference    intakes for calcium and D. [place]: The    [employer]. 2. Holick MF, Binkley [place], Bischoff-Ferrari HA, et al.    Evaluation, treatment, and prevention of vitamin D    deficiency: an Endocrine Society clinical practice    guideline. JCEM. 2011 Jul; 96(7):1911-30.           Passed - Valid encounter within last 12 months    Recent Outpatient Visits           2 months ago Upper respiratory tract infection, unspecified type   [facility]  Valentino Nose, NP   2 months ago Burning with urination   [facility] Valentino Nose, NP   3 months ago Essential hypertension   [facility] Coker, Pierre Part, DO   4 months ago Dizziness   [facility] Edenborn, Megan P, DO   4 months ago Diabetes mellitus associated with hormonal etiology [facility])   [facility] Dorcas Carrow, DO       Future Appointments             In 1 month Johnson, Oralia Rud, DO [employer], [facility]

## 2020-02-23 ENCOUNTER — Encounter: Payer: Self-pay | Admitting: Family Medicine

## 2020-02-23 ENCOUNTER — Ambulatory Visit (INDEPENDENT_AMBULATORY_CARE_PROVIDER_SITE_OTHER): Payer: 59 | Admitting: Family Medicine

## 2020-02-23 ENCOUNTER — Other Ambulatory Visit: Payer: Self-pay

## 2020-02-23 VITALS — BP 132/77 | HR 68 | Temp 98.3°F | Ht <= 58 in | Wt 196.6 lb

## 2020-02-23 DIAGNOSIS — Z23 Encounter for immunization: Secondary | ICD-10-CM

## 2020-02-23 DIAGNOSIS — E1169 Type 2 diabetes mellitus with other specified complication: Secondary | ICD-10-CM

## 2020-02-23 DIAGNOSIS — Z0000 Encounter for general adult medical examination without abnormal findings: Secondary | ICD-10-CM | POA: Diagnosis not present

## 2020-02-23 DIAGNOSIS — F419 Anxiety disorder, unspecified: Secondary | ICD-10-CM

## 2020-02-23 DIAGNOSIS — Z13820 Encounter for screening for osteoporosis: Secondary | ICD-10-CM

## 2020-02-23 DIAGNOSIS — F3342 Major depressive disorder, recurrent, in full remission: Secondary | ICD-10-CM

## 2020-02-23 DIAGNOSIS — I10 Essential (primary) hypertension: Secondary | ICD-10-CM

## 2020-02-23 DIAGNOSIS — E559 Vitamin D deficiency, unspecified: Secondary | ICD-10-CM

## 2020-02-23 DIAGNOSIS — F339 Major depressive disorder, recurrent, unspecified: Secondary | ICD-10-CM | POA: Diagnosis not present

## 2020-02-23 DIAGNOSIS — Z6841 Body Mass Index (BMI) 40.0 and over, adult: Secondary | ICD-10-CM

## 2020-02-23 DIAGNOSIS — R32 Unspecified urinary incontinence: Secondary | ICD-10-CM

## 2020-02-23 DIAGNOSIS — E785 Hyperlipidemia, unspecified: Secondary | ICD-10-CM

## 2020-02-23 LAB — URINALYSIS, ROUTINE W REFLEX MICROSCOPIC
Bilirubin, UA: NEGATIVE
Glucose, UA: NEGATIVE
Ketones, UA: NEGATIVE
Leukocytes,UA: NEGATIVE
Nitrite, UA: NEGATIVE
Protein,UA: NEGATIVE
RBC, UA: NEGATIVE
Specific Gravity, UA: >1.03 — ABNORMAL HIGH (ref 1.005–1.030)
Urobilinogen, Ur: 1 mg/dL (ref 0.2–1.0)
pH, UA: 5.5 (ref 5.0–7.5)

## 2020-02-23 LAB — MICROALBUMIN, URINE WAIVED
Creatinine, Urine Waived: 300 mg/dL (ref 10–300)
Microalb, Ur Waived: 30 mg/L — ABNORMAL HIGH (ref 0–19)
Microalb/Creat Ratio: <30 mg/g (ref <30)

## 2020-02-23 LAB — BAYER DCA HB A1C WAIVED: HB A1C (BAYER DCA - WAIVED): 5.2 % (ref <7.0)

## 2020-02-23 MED ORDER — TRULICITY 3 MG/0.5ML ~~LOC~~ SOAJ
3.0000 mg | SUBCUTANEOUS | 6 refills | Status: DC
Start: 2020-02-23 — End: 2020-08-09

## 2020-02-23 MED ORDER — ATORVASTATIN CALCIUM 10 MG PO TABS: 10.0000 mg | ORAL_TABLET | ORAL | 0 refills | Status: DC

## 2020-02-23 MED ORDER — MYRBETRIQ 50 MG PO TB24: 50.0000 mg | ORAL_TABLET | Freq: Every day | ORAL | 1 refills | Status: DC

## 2020-02-23 MED ORDER — HYDROCHLOROTHIAZIDE 50 MG PO TABS: 50.0000 mg | ORAL_TABLET | Freq: Every day | ORAL | 1 refills | Status: DC

## 2020-02-23 MED ORDER — HYOSCYAMINE SULFATE 0.125 MG SL SUBL: SUBLINGUAL_TABLET | SUBLINGUAL | 1 refills | Status: DC

## 2020-02-23 MED ORDER — BUPROPION HCL ER (SR) 150 MG PO TB12: 150.0000 mg | ORAL_TABLET | Freq: Three times a day (TID) | ORAL | 1 refills | Status: DC

## 2020-02-23 MED ORDER — OMEPRAZOLE 40 MG PO CPDR: 40.0000 mg | DELAYED_RELEASE_CAPSULE | Freq: Every day | ORAL | 1 refills | Status: DC

## 2020-02-23 NOTE — Assessment & Plan Note (Signed)
Under good control on current regimen. Continue current regimen. Continue to monitor. Call with any concerns. Refills given. Labs drawn today.   

## 2020-02-23 NOTE — Assessment & Plan Note (Signed)
Rechecking levels today. Await results. Treat as needed.  

## 2020-02-23 NOTE — Assessment & Plan Note (Signed)
Continue wellbutrin and trulicity. Encouraged diet and exercise. Call with any concerns.

## 2020-02-23 NOTE — Assessment & Plan Note (Signed)
Under good control on current regimen. Continue current regimen. Continue to monitor. Call with any concerns. Refills given. Using lorazepam 1x a week. 30 pills should last her at least 6 months.   

## 2020-02-23 NOTE — Progress Notes (Signed)
BP 132/77   Pulse 68   Temp 98.3 F (36.8 C)   Ht 4' 9.5" (1.461 m)   Wt 196 lb 9.6 oz (89.2 kg)   SpO2 97%   BMI 41.81 kg/m    Subjective:    Patient ID: [name], female    DOB: [birth year], 64 y.o.   MRN: [id number]  HPI: [name] is a 64 y.o. female presenting on 02/23/2020 for comprehensive medical examination. Current medical complaints include:  DIABETES Hypoglycemic episodes:no Polydipsia/polyuria: no Visual disturbance: no Chest pain: no Paresthesias: no Glucose Monitoring: no  Accucheck frequency: Not Checking Taking Insulin?: no Blood Pressure Monitoring: not checking Retinal Examination: Up to Date Foot Exam: Up to Date Diabetic Education: Completed Pneumovax: Up to Date Influenza: Not up to Date Aspirin: yes  HYPERTENSION / HYPERLIPIDEMIA Satisfied with current treatment? yes Duration of hypertension: chronic BP monitoring frequency: not checking BP medication side effects: no Past BP meds: HCTZ Duration of hyperlipidemia: chronic Cholesterol medication side effects: hasn't been taking her medicine Cholesterol supplements: none Past cholesterol medications: atorvastatin Medication compliance: poor compliance Aspirin: no Recent stressors: no Recurrent headaches: no Visual changes: no Palpitations: no Dyspnea: no Chest pain: no Lower extremity edema: no Dizzy/lightheaded: no  ANXIETY/DEPRESSION- takes her lorazepam about 1x a week Duration:controlled Anxious mood: no  Excessive worrying: no Irritability: no  Sweating: no Nausea: no Palpitations:no Hyperventilation: no Panic attacks: no Agoraphobia: no  Obscessions/compulsions: no Depressed mood: no Depression screen [facility] 2/9 02/23/2020 10/22/2019 09/10/2019 06/09/2019 12/02/2018  Decreased Interest 0 0 1 0 0  Down, Depressed, Hopeless 0 0 1 0 0  PHQ - 2 Score 0 0 2 0 0  Altered sleeping 0 0 1 0 0  Tired, decreased energy 0 0 1 0 0  Change in appetite 0 0 1 0 0  Feeling  bad or failure about yourself  0 0 1 0 0  Trouble concentrating 0 0 0 0 0  Moving slowly or fidgety/restless 0 0 0 0 0  Suicidal thoughts 0 0 0 0 0  PHQ-9 Score 0 0 6 0 0  Difficult doing work/chores Not difficult at all - Somewhat difficult Not difficult at all Not difficult at all  Some recent data might be hidden   Anhedonia: no Weight changes: no Insomnia: no   Hypersomnia: no Fatigue/loss of energy: no Feelings of worthlessness: no Feelings of guilt: no Impaired concentration/indecisiveness: no Suicidal ideations: no  Crying spells: no Recent Stressors/Life Changes: no   Relationship problems: no   Family stress: no     Financial stress: no    Job stress: no    Recent death/loss: no  Continues with urinary incontinence and leaking. Does not want to do surgery.   Menopausal Symptoms: no  Depression Screen done today and results listed below:  Depression screen [facility] 2/9 02/23/2020 10/22/2019 09/10/2019 06/09/2019 12/02/2018  Decreased Interest 0 0 1 0 0  Down, Depressed, Hopeless 0 0 1 0 0  PHQ - 2 Score 0 0 2 0 0  Altered sleeping 0 0 1 0 0  Tired, decreased energy 0 0 1 0 0  Change in appetite 0 0 1 0 0  Feeling bad or failure about yourself  0 0 1 0 0  Trouble concentrating 0 0 0 0 0  Moving slowly or fidgety/restless 0 0 0 0 0  Suicidal thoughts 0 0 0 0 0  PHQ-9 Score 0 0 6 0 0  Difficult doing work/chores Not difficult at all -  Somewhat difficult Not difficult at all Not difficult at all  Some recent data might be hidden    Past Medical History:  Past Medical History:  Diagnosis Date  . Asthma   . Depression   . Diabetes mellitus without complication ([place])   . Hyperlipidemia   . Kidney stones   . Migraine headache     Surgical History:  Past Surgical History:  Procedure Laterality Date  . ABDOMINAL HYSTERECTOMY    . BREAST CYST ASPIRATION Right 1981   benign  . BREAST CYST ASPIRATION Right 1984   benign  . CHOLECYSTECTOMY    . COLONOSCOPY  March 2016   . FLEXIBLE BRONCHOSCOPY N/A 01/26/2018   Procedure: FLEXIBLE BRONCHOSCOPY;  Surgeon: Laverle Hobby, MD;  Location: [facility] ORS;  Service: Pulmonary;  Laterality: N/A;  . MENISCUS REPAIR Left 09/18/2018  . OOPHORECTOMY    . PARTIAL KNEE ARTHROPLASTY Left 02/2019    Medications:  Current Outpatient Medications on File Prior to Visit  Medication Sig  . albuterol (PROAIR HFA) 108 (90 Base) MCG/ACT inhaler Inhale 2 puffs into the lungs every 6 (six) hours as needed for wheezing or shortness of breath.  . benzonatate (TESSALON) 100 MG capsule Take 1 capsule (100 mg total) by mouth 3 (three) times daily as needed for cough.  . Cholecalciferol 25 MCG (1000 UT) capsule Take 1 capsule (1,000 Units total) by mouth daily.  . cyclobenzaprine (FLEXERIL) 5 MG tablet TAKE ONE TABLET BY MOUTH 3 TIMES DAILY AS NEEDED FOR MUSCLE SPASM  . fluticasone (FLONASE) 50 MCG/ACT nasal spray Place 2 sprays into both nostrils 2 (two) times daily.  . Fluticasone-Umeclidin-Vilant (TRELEGY ELLIPTA) 100-62.5-25 MCG/INH AEPB Inhale 1 puff into the lungs daily.  Marland Kitchen guaiFENesin (MUCINEX) 600 MG 12 hr tablet Take 1 tablet (600 mg total) by mouth 2 (two) times daily as needed for cough or to loosen phlegm.  Marland Kitchen LORazepam (ATIVAN) 1 MG tablet TAKE 1/2 TO 1 TABLET AS NEEDED FOR ANXIETY  . meloxicam (MOBIC) 15 MG tablet Take 1 tablet (15 mg total) by mouth daily.  . Vitamin D, Ergocalciferol, (DRISDOL) 1.25 MG (50000 UNIT) CAPS capsule TAKE 1 CAPSULE EVERY 7 DAYS   No current facility-administered medications on file prior to visit.    Allergies:  Allergies  Allergen Reactions  . Cymbalta [Duloxetine Hcl] Other (See Comments)    Hallucinations  . Amoxicillin Hives    Social History:  Social History   Socioeconomic History  . Marital status: Legally Separated    Spouse name: Not on file  . Number of children: Not on file  . Years of education: Not on file  . Highest education level: Not on file  Occupational  History  . Not on file  Tobacco Use  . Smoking status: Never Smoker  . Smokeless tobacco: Never Used  Vaping Use  . Vaping Use: Never used  Substance and Sexual Activity  . Alcohol use: Yes    Comment: social  . Drug use: No  . Sexual activity: Not on file  Other Topics Concern  . Not on file  Social History Narrative  . Not on file   Social Determinants of Health   Financial Resource Strain:   . Difficulty of Paying Living Expenses: Not on file  Food Insecurity:   . Worried About Charity fundraiser in the Last Year: Not on file  . Ran Out of Food in the Last Year: Not on file  Transportation Needs:   . Lack of Transportation (Medical): Not  on file  . Lack of Transportation (Non-Medical): Not on file  Physical Activity:   . Days of Exercise per Week: Not on file  . Minutes of Exercise per Session: Not on file  Stress:   . Feeling of Stress : Not on file  Social Connections:   . Frequency of Communication with Friends and Family: Not on file  . Frequency of Social Gatherings with Friends and Family: Not on file  . Attends Religious Services: Not on file  . Active Member of Clubs or Organizations: Not on file  . Attends Archivist Meetings: Not on file  . Marital Status: Not on file  Intimate Partner Violence:   . Fear of Current or Ex-Partner: Not on file  . Emotionally Abused: Not on file  . Physically Abused: Not on file  . Sexually Abused: Not on file   Social History   Tobacco Use  Smoking Status Never Smoker  Smokeless Tobacco Never Used   Social History   Substance and Sexual Activity  Alcohol Use Yes   Comment: social    Family History:  Family History  Problem Relation Age of Onset  . Hypertension Mother   . Cancer Mother        breast  . Arthritis Mother   . Alzheimer's disease Mother   . Parkinson's disease Mother   . Breast cancer Mother 58  . Hypertension Father   . Diabetes Father   . Kidney cancer Father   . Breast cancer  Cousin 83  . Breast cancer Maternal Aunt   . Breast cancer Maternal Grandmother 83    Past medical history, surgical history, medications, allergies, family history and social history reviewed with patient today and changes made to appropriate areas of the chart.   Review of Systems  Constitutional: Negative.   HENT: Negative.   Eyes: Negative.   Respiratory: Positive for cough and wheezing. Negative for hemoptysis, sputum production and shortness of breath.   Cardiovascular: Negative.   Gastrointestinal: Positive for constipation and diarrhea. Negative for abdominal pain, blood in stool, heartburn, melena, nausea and vomiting.  Genitourinary: Negative.   Musculoskeletal: Negative.   Skin: Negative.   Neurological: Negative.   Endo/Heme/Allergies: Positive for environmental allergies. Negative for polydipsia. Does not bruise/bleed easily.  Psychiatric/Behavioral: Negative.     All other ROS negative except what is listed above and in the HPI.      Objective:    BP 132/77   Pulse 68   Temp 98.3 F (36.8 C)   Ht 4' 9.5" (1.461 m)   Wt 196 lb 9.6 oz (89.2 kg)   SpO2 97%   BMI 41.81 kg/m   Wt Readings from Last 3 Encounters:  02/23/20 196 lb 9.6 oz (89.2 kg)  12/02/19 195 lb (88.5 kg)  11/19/19 192 lb (87.1 kg)    Physical Exam Vitals and nursing note reviewed.  Constitutional:      General: She is not in acute distress.    Appearance: Normal appearance. She is not ill-appearing, toxic-appearing or diaphoretic.  HENT:     Head: Normocephalic and atraumatic.     Right Ear: Tympanic membrane, ear canal and external ear normal. There is no impacted cerumen.     Left Ear: Tympanic membrane, ear canal and external ear normal. There is no impacted cerumen.     Nose: Nose normal. No congestion or rhinorrhea.     Mouth/Throat:     Mouth: Mucous membranes are moist.     Pharynx: Oropharynx  is clear. No oropharyngeal exudate or posterior oropharyngeal erythema.  Eyes:      General: No scleral icterus.       Right eye: No discharge.        Left eye: No discharge.     Extraocular Movements: Extraocular movements intact.     Conjunctiva/sclera: Conjunctivae normal.     Pupils: Pupils are equal, round, and reactive to light.  Neck:     Vascular: No carotid bruit.  Cardiovascular:     Rate and Rhythm: Normal rate and regular rhythm.     Pulses: Normal pulses.     Heart sounds: No murmur heard.  No friction rub. No gallop.   Pulmonary:     Effort: Pulmonary effort is normal. No respiratory distress.     Breath sounds: Normal breath sounds. No stridor. No wheezing, rhonchi or rales.  Chest:     Chest wall: No tenderness.  Abdominal:     General: Abdomen is flat. Bowel sounds are normal. There is no distension.     Palpations: Abdomen is soft. There is no mass.     Tenderness: There is no abdominal tenderness. There is no right CVA tenderness, left CVA tenderness, guarding or rebound.     Hernia: No hernia is present.  Genitourinary:    Comments: Breast and pelvic exams deferred with shared decision making Musculoskeletal:        General: No swelling, tenderness, deformity or signs of injury.     Cervical back: Normal range of motion and neck supple. No rigidity. No muscular tenderness.     Right lower leg: No edema.     Left lower leg: No edema.  Lymphadenopathy:     Cervical: No cervical adenopathy.  Skin:    General: Skin is warm and dry.     Capillary Refill: Capillary refill takes less than 2 seconds.     Coloration: Skin is not jaundiced or pale.     Findings: No bruising, erythema, lesion or rash.  Neurological:     General: No focal deficit present.     Mental Status: She is alert and oriented to person, place, and time. Mental status is at baseline.     Cranial Nerves: No cranial nerve deficit.     Sensory: No sensory deficit.     Motor: No weakness.     Coordination: Coordination normal.     Gait: Gait normal.     Deep Tendon Reflexes:  Reflexes normal.  Psychiatric:        Mood and Affect: Mood normal.        Behavior: Behavior normal.        Thought Content: Thought content normal.        Judgment: Judgment normal.     Results for orders placed or performed during the hospital encounter of 11/19/19  SARS Coronavirus 2 by RT PCR (hospital order, performed in [facility] hospital lab) Nasopharyngeal Nasopharyngeal Swab   Specimen: Nasopharyngeal Swab  Result Value Ref Range   SARS Coronavirus 2 NEGATIVE NEGATIVE      Assessment & Plan:   Problem List Items Addressed This Visit      Cardiovascular and Mediastinum   Essential hypertension    Under good control on current regimen. Continue current regimen. Continue to monitor. Call with any concerns. Refills given. Labs drawn today.        Relevant Medications   atorvastatin (LIPITOR) 10 MG tablet   hydrochlorothiazide (HYDRODIURIL) 50 MG tablet   Other Relevant Orders  CBC with Differential/Platelet   Comprehensive metabolic panel   Microalbumin, Urine Waived     Endocrine   Diabetes mellitus associated with hormonal etiology ([place])    Doing great with a1c of 5.2. Continue current regimen. Continue to monitor. Call with any concerns.       Relevant Medications   atorvastatin (LIPITOR) 10 MG tablet   Dulaglutide (TRULICITY) 3 TI/1.4ER SOPN   Other Relevant Orders   Bayer DCA Hb A1c Waived   CBC with Differential/Platelet   Comprehensive metabolic panel   Microalbumin, Urine Waived   Urinalysis, Routine w reflex microscopic   Hyperlipidemia associated with type 2 diabetes mellitus ([place])    Has not been taking her medicine. Restart medicine. Checking levels today. Call with any concerns.       Relevant Medications   atorvastatin (LIPITOR) 10 MG tablet   Dulaglutide (TRULICITY) 3 XV/4.0GQ SOPN   Other Relevant Orders   CBC with Differential/Platelet   Comprehensive metabolic panel   Lipid Panel w/o Chol/HDL Ratio     Other   Depression,  recurrent (HCC)    Under good control on current regimen. Continue current regimen. Continue to monitor. Call with any concerns. Refills given. Using lorazepam 1x a week. 30 pills should last her at least 6 months.        Relevant Medications   buPROPion (WELLBUTRIN SR) 150 MG 12 hr tablet   Other Relevant Orders   CBC with Differential/Platelet   Comprehensive metabolic panel   TSH   BMI 40.0-44.9, adult ([place])    Continue wellbutrin and trulicity. Encouraged diet and exercise. Call with any concerns.       Relevant Medications   Dulaglutide (TRULICITY) 3 QP/6.1PJ SOPN   Anxiety    Under good control on current regimen. Continue current regimen. Continue to monitor. Call with any concerns. Refills given. Using lorazepam 1x a week. 30 pills should last her at least 6 months.        Relevant Medications   buPROPion (WELLBUTRIN SR) 150 MG 12 hr tablet   Other Relevant Orders   CBC with Differential/Platelet   Comprehensive metabolic panel   Vitamin D deficiency    Rechecking levels today. Await results. Treat as needed.       Relevant Orders   CBC with Differential/Platelet   Comprehensive metabolic panel   VITAMIN D 25 Hydroxy (Vit-D Deficiency, Fractures)    Other Visit Diagnoses    Routine general medical examination at a health care facility    -  Primary   Vaccines updated/declined. Screening labs checked today. Pap N/A. Mammo and colonoscopy up to date. DEXA ordered. Continue diet and exercise.    Urinary incontinence, unspecified type       Would like to do pelvic PT. Referral generated today. Await their input.    Relevant Medications   MYRBETRIQ 50 MG TB24 tablet   Other Relevant Orders   Ambulatory referral to Physical Therapy   Screening for osteoporosis       DEXA ordered today.    Relevant Orders   DG Bone Density   Recurrent major depressive disorder, in full remission ([place])       Relevant Medications   buPROPion (WELLBUTRIN SR) 150 MG 12 hr tablet         Follow up plan: Return in about 6 months (around 08/22/2020).   LABORATORY TESTING:  - Pap smear: not applicable  IMMUNIZATIONS:   - Tdap: Tetanus vaccination status reviewed: last tetanus booster within 10 years. -  Influenza: Refused - Pneumovax: Up to date - Prevnar: Administered today - COVID: Up to date - Shingrix vaccine: Administered today  SCREENING: -Mammogram: Up to date  - Colonoscopy: Up to date  - Bone Density: Ordered today   PATIENT COUNSELING:   Advised to take 1 mg of folate supplement per day if capable of pregnancy.   Sexuality: Discussed sexually transmitted diseases, partner selection, use of condoms, avoidance of unintended pregnancy  and contraceptive alternatives.   Advised to avoid cigarette smoking.  I discussed with the patient that most people either abstain from alcohol or drink within safe limits (<=14/week and <=4 drinks/occasion for males, <=7/weeks and <= 3 drinks/occasion for females) and that the risk for alcohol disorders and other health effects rises proportionally with the number of drinks per week and how often a drinker exceeds daily limits.  Discussed cessation/primary prevention of drug use and availability of treatment for abuse.   Diet: Encouraged to adjust caloric intake to maintain  or achieve ideal body weight, to reduce intake of dietary saturated fat and total fat, to limit sodium intake by avoiding high sodium foods and not adding table salt, and to maintain adequate dietary potassium and calcium preferably from fresh fruits, vegetables, and low-fat dairy products.    stressed the importance of regular exercise  Injury prevention: Discussed safety belts, safety helmets, smoke detector, smoking near bedding or upholstery.   Dental health: Discussed importance of regular tooth brushing, flossing, and dental visits.    NEXT PREVENTATIVE PHYSICAL DUE IN 1 YEAR. Return in about 6 months (around 08/22/2020).

## 2020-02-23 NOTE — Patient Instructions (Addendum)
Call to schedule your mammogram and bone density: [facility] at [facility]  Address: [address] [place], [place], [place] [address]  Phone: [phone]   Health Maintenance After Age 65 After age 65, you are at a higher risk for certain long-term diseases and infections as well as injuries from falls. Falls are a major cause of broken bones and head injuries in people who are older than age 65. Getting regular preventive care can help to keep you healthy and well. Preventive care includes getting regular testing and making lifestyle changes as recommended by your health care provider. Talk with your health care provider about:  Which screenings and tests you should have. A screening is a test that checks for a disease when you have no symptoms.  A diet and exercise plan that is right for you. What should I know about screenings and tests to prevent falls? Screening and testing are the best ways to find a health problem early. Early diagnosis and treatment give you the best chance of managing medical conditions that are common after age 65. Certain conditions and lifestyle choices may make you more likely to have a fall. Your health care provider may recommend:  Regular vision checks. Poor vision and conditions such as cataracts can make you more likely to have a fall. If you wear glasses, make sure to get your prescription updated if your vision changes.  Medicine review. Work with your health care provider to regularly review all of the medicines you are taking, including over-the-counter medicines. Ask your health care provider about any side effects that may make you more likely to have a fall. Tell your health care provider if any medicines that you take make you feel dizzy or sleepy.  Osteoporosis screening. Osteoporosis is a condition that causes the bones to get weaker. This can make the bones weak and cause them to break more easily.  Blood pressure screening.  Blood pressure changes and medicines to control blood pressure can make you feel dizzy.  Strength and balance checks. Your health care provider may recommend certain tests to check your strength and balance while standing, walking, or changing positions.  Foot health exam. Foot pain and numbness, as well as not wearing proper footwear, can make you more likely to have a fall.  Depression screening. You may be more likely to have a fall if you have a fear of falling, feel emotionally low, or feel unable to do activities that you used to do.  Alcohol use screening. Using too much alcohol can affect your balance and may make you more likely to have a fall. What actions can I take to lower my risk of falls? General instructions  Talk with your health care provider about your risks for falling. Tell your health care provider if: ? You fall. Be sure to tell your health care provider about all falls, even ones that seem minor. ? You feel dizzy, sleepy, or off-balance.  Take over-the-counter and prescription medicines only as told by your health care provider. These include any supplements.  Eat a healthy diet and maintain a healthy weight. A healthy diet includes low-fat dairy products, low-fat (lean) meats, and fiber from whole grains, beans, and lots of fruits and vegetables. Home safety  Remove any tripping hazards, such as rugs, cords, and clutter.  Install safety equipment such as grab bars in bathrooms and safety rails on stairs.  Keep rooms and walkways well-lit. Activity   Follow a regular exercise program  to stay fit. This will help you maintain your balance. Ask your health care provider what types of exercise are appropriate for you.  If you need a cane or walker, use it as recommended by your health care provider.  Wear supportive shoes that have nonskid soles. Lifestyle  Do not drink alcohol if your health care provider tells you not to drink.  If you drink alcohol, limit  how much you have: ? 0-1 drink a day for women. ? 0-2 drinks a day for men.  Be aware of how much alcohol is in your drink. In the [place] one drink equals one typical bottle of beer (12 oz), one-half glass of wine (5 oz), or one shot of hard liquor (1 oz).  Do not use any products that contain nicotine or tobacco, such as cigarettes and e-cigarettes. If you need help quitting, ask your health care provider. Summary  Having a healthy lifestyle and getting preventive care can help to protect your health and wellness after age 65.  Screening and testing are the best way to find a health problem early and help you avoid having a fall. Early diagnosis and treatment give you the best chance for managing medical conditions that are more common for people who are older than age 65.  Falls are a major cause of broken bones and head injuries in people who are older than age 65. Take precautions to prevent a fall at home.  Work with your health care provider to learn what changes you can make to improve your health and wellness and to prevent falls. This information is not intended to replace advice given to you by your health care provider. Make sure you discuss any questions you have with your health care provider. Document Revised: 07/30/2018 Document Reviewed: 02/19/2017 Elsevier Patient Education  2020 Elsevier Inc. Zoster Vaccine, Recombinant injection What is this medicine? ZOSTER VACCINE (ZOS ter vak SEEN) is used to prevent shingles in adults 65 years old and over. This vaccine is not used to treat shingles or nerve pain from shingles. This medicine may be used for other purposes; ask your health care provider or pharmacist if you have questions. COMMON BRAND NAME(S): [facility] What should I tell my health care provider before I take this medicine? They need to know if you have any of these conditions:  blood disorders or disease  cancer like leukemia or lymphoma  immune system problems  or therapy  an unusual or allergic reaction to vaccines, other medications, foods, dyes, or preservatives  pregnant or trying to get pregnant  breast-feeding How should I use this medicine? This vaccine is for injection in a muscle. It is given by a health care professional. Talk to your pediatrician regarding the use of this medicine in children. This medicine is not approved for use in children. Overdosage: If you think you have taken too much of this medicine contact a poison control center or emergency room at once. NOTE: This medicine is only for you. Do not share this medicine with others. What if I miss a dose? Keep appointments for follow-up (booster) doses as directed. It is important not to miss your dose. Call your doctor or health care professional if you are unable to keep an appointment. What may interact with this medicine?  medicines that suppress your immune system  medicines to treat cancer  steroid medicines like prednisone or cortisone This list may not describe all possible interactions. Give your health care provider a list of all the  medicines, herbs, non-prescription drugs, or dietary supplements you use. Also tell them if you smoke, drink alcohol, or use illegal drugs. Some items may interact with your medicine. What should I watch for while using this medicine? Visit your doctor for regular check ups. This vaccine, like all vaccines, may not fully protect everyone. What side effects may I notice from receiving this medicine? Side effects that you should report to your doctor or health care professional as soon as possible:  allergic reactions like skin rash, itching or hives, swelling of the face, lips, or tongue  breathing problems Side effects that usually do not require medical attention (report these to your doctor or health care professional if they continue or are bothersome):  chills  headache  fever  nausea, vomiting  redness, warmth, pain,  swelling or itching at site where injected  tiredness This list may not describe all possible side effects. Call your doctor for medical advice about side effects. You may report side effects to FDA at 1-800-FDA-1088. Where should I keep my medicine? This vaccine is only given in a clinic, pharmacy, doctor's office, or other health care setting and will not be stored at home. NOTE: This sheet is a summary. It may not cover all possible information. If you have questions about this medicine, talk to your doctor, pharmacist, or health care provider.  2020 Elsevier/Gold Standard (2016-11-18 13:20:30) Pneumococcal Conjugate Vaccine (PCV13): What You Need to Know 1. Why get vaccinated? Pneumococcal conjugate vaccine (PCV13) can prevent pneumococcal disease. Pneumococcal disease refers to any illness caused by pneumococcal bacteria. These bacteria can cause many types of illnesses, including pneumonia, which is an infection of the lungs. Pneumococcal bacteria are one of the most common causes of pneumonia. Besides pneumonia, pneumococcal bacteria can also cause:  Ear infections  Sinus infections  Meningitis (infection of the tissue covering the brain and spinal cord)  Bacteremia (bloodstream infection) Anyone can get pneumococcal disease, but children under 13 years of age, people with certain medical conditions, adults 65 years or older, and cigarette smokers are at the highest risk. Most pneumococcal infections are mild. However, some can result in long-term problems, such as brain damage or hearing loss. Meningitis, bacteremia, and pneumonia caused by pneumococcal disease can be fatal. 2. PCV13 PCV13 protects against 13 types of bacteria that cause pneumococcal disease. Infants and young children usually need 4 doses of pneumococcal conjugate vaccine, at 2, 4, 6, and 41-92 months of age. In some cases, a child might need fewer than 4 doses to complete PCV13 vaccination. A dose of PCV23 vaccine  is also recommended for anyone 2 years or older with certain medical conditions if they did not already receive PCV13. This vaccine may be given to adults 65 years or older based on discussions between the patient and health care provider. 3. Talk with your health care provider Tell your vaccine provider if the person getting the vaccine:  Has had an allergic reaction after a previous dose of PCV13, to an earlier pneumococcal conjugate vaccine known as PCV7, or to any vaccine containing diphtheria toxoid (for example, DTaP), or has any severe, life-threatening allergies.  In some cases, your health care provider may decide to postpone PCV13 vaccination to a future visit. People with minor illnesses, such as a cold, may be vaccinated. People who are moderately or severely ill should usually wait until they recover before getting PCV13. Your health care provider can give you more information. 4. Risks of a vaccine reaction  Redness, swelling, pain,  or tenderness where the shot is given, and fever, loss of appetite, fussiness (irritability), feeling tired, headache, and chills can happen after PCV13. Young children may be at increased risk for seizures caused by fever after PCV13 if it is administered at the same time as inactivated influenza vaccine. Ask your health care provider for more information. People sometimes faint after medical procedures, including vaccination. Tell your provider if you feel dizzy or have vision changes or ringing in the ears. As with any medicine, there is a very remote chance of a vaccine causing a severe allergic reaction, other serious injury, or death. 5. What if there is a serious problem? An allergic reaction could occur after the vaccinated person leaves the clinic. If you see signs of a severe allergic reaction (hives, swelling of the face and throat, difficulty breathing, a fast heartbeat, dizziness, or weakness), call 9-1-1 and get the person to the nearest  hospital. For other signs that concern you, call your health care provider. Adverse reactions should be reported to the Vaccine Adverse Event Reporting System (VAERS). Your health care provider will usually file this report, or you can do it yourself. Visit the VAERS website at www.vaers.LAgents.nohhs.gov or call 657-568-89731-403-649-4277. VAERS is only for reporting reactions, and VAERS staff do not give medical advice. 6. The National Vaccine Injury Compensation Program The [employer] Vaccine Injury Compensation Program (VICP) is a federal program that was created to compensate people who may have been injured by certain vaccines. Visit the VICP website at SpiritualWord.atwww.hrsa.gov/vaccinecompensation or call 416-621-39241-(920) 541-2885 to learn about the program and about filing a claim. There is a time limit to file a claim for compensation. 7. How can I learn more?  Ask your health care provider.  Call your local or state health department.  Contact the Centers for Disease Control and Prevention (CDC): ? Call (340)135-04751-934-480-2713 (1-800-CDC-INFO) or ? Visit CDC's website at PicCapture.uywww.cdc.gov/vaccines Vaccine Information Statement PCV13 Vaccine (02/18/2018) This information is not intended to replace advice given to you by your health care provider. Make sure you discuss any questions you have with your health care provider. Document Revised: 07/28/2018 Document Reviewed: 11/18/2017 Elsevier Patient Education  2020 [employer]

## 2020-02-23 NOTE — Assessment & Plan Note (Signed)
Has not been taking her medicine. Restart medicine. Checking levels today. Call with any concerns.

## 2020-02-23 NOTE — Assessment & Plan Note (Signed)
Under good control on current regimen. Continue current regimen. Continue to monitor. Call with any concerns. Refills given. Using lorazepam 1x a week. 30 pills should last her at least 6 months.

## 2020-02-23 NOTE — Assessment & Plan Note (Signed)
Doing great with a1c of 5.2. Continue current regimen. Continue to monitor. Call with any concerns.

## 2020-02-24 ENCOUNTER — Telehealth: Payer: Self-pay

## 2020-02-24 LAB — COMPREHENSIVE METABOLIC PANEL
ALT: 21 IU/L (ref 0–32)
AST: 17 IU/L (ref 0–40)
Albumin/Globulin Ratio: 2.2 (ref 1.2–2.2)
Albumin: 4.1 g/dL (ref 3.8–4.8)
Alkaline Phosphatase: 66 IU/L (ref 44–121)
BUN/Creatinine Ratio: 21 (ref 12–28)
BUN: 16 mg/dL (ref 8–27)
Bilirubin Total: 0.6 mg/dL (ref 0.0–1.2)
CO2: 28 mmol/L (ref 20–29)
Calcium: 9.2 mg/dL (ref 8.7–10.3)
Chloride: 101 mmol/L (ref 96–106)
Creatinine, Ser: 0.75 mg/dL (ref 0.57–1.00)
GFR calc Af Amer: 97 mL/min/{1.73_m2} (ref >59)
GFR calc non Af Amer: 84 mL/min/{1.73_m2} (ref >59)
Globulin, Total: 1.9 g/dL (ref 1.5–4.5)
Glucose: 121 mg/dL — ABNORMAL HIGH (ref 65–99)
Potassium: 3.5 mmol/L (ref 3.5–5.2)
Sodium: 142 mmol/L (ref 134–144)
Total Protein: 6 g/dL (ref 6.0–8.5)

## 2020-02-24 LAB — CBC WITH DIFFERENTIAL/PLATELET
Basophils Absolute: 0 10*3/uL (ref 0.0–0.2)
Basos: 1 %
EOS (ABSOLUTE): 0.3 10*3/uL (ref 0.0–0.4)
Eos: 7 %
Hematocrit: 38.6 % (ref 34.0–46.6)
Hemoglobin: 13.4 g/dL (ref 11.1–15.9)
Immature Grans (Abs): 0 10*3/uL (ref 0.0–0.1)
Immature Granulocytes: 0 %
Lymphocytes Absolute: 1.9 10*3/uL (ref 0.7–3.1)
Lymphs: 45 %
MCH: 31.8 pg (ref 26.6–33.0)
MCHC: 34.7 g/dL (ref 31.5–35.7)
MCV: 92 fL (ref 79–97)
Monocytes Absolute: 0.3 10*3/uL (ref 0.1–0.9)
Monocytes: 8 %
Neutrophils Absolute: 1.6 10*3/uL (ref 1.4–7.0)
Neutrophils: 39 %
Platelets: 248 10*3/uL (ref 150–450)
RBC: 4.21 x10E6/uL (ref 3.77–5.28)
RDW: 11.5 % — ABNORMAL LOW (ref 11.7–15.4)
WBC: 4.2 10*3/uL (ref 3.4–10.8)

## 2020-02-24 LAB — LIPID PANEL W/O CHOL/HDL RATIO
Cholesterol, Total: 167 mg/dL (ref 100–199)
HDL: 67 mg/dL (ref >39)
LDL Chol Calc (NIH): 84 mg/dL (ref 0–99)
Triglycerides: 88 mg/dL (ref 0–149)
VLDL Cholesterol Cal: 16 mg/dL (ref 5–40)

## 2020-02-24 LAB — TSH: TSH: 1.61 u[IU]/mL (ref 0.450–4.500)

## 2020-02-24 LAB — VITAMIN D 25 HYDROXY (VIT D DEFICIENCY, FRACTURES): Vit D, 25-Hydroxy: 54 ng/mL (ref 30.0–100.0)

## 2020-02-24 NOTE — Telephone Encounter (Signed)
Copied from CRM [id number]. Topic: General - Other >> Feb 24, 2020  9:50 AM Dalphine Handing A wrote: Pt would like a callback to go over lab results

## 2020-02-24 NOTE — Telephone Encounter (Signed)
Called patient. She states that she viewed her results on her mychart and does not have any questions.

## 2020-03-06 ENCOUNTER — Encounter: Payer: Self-pay | Admitting: Family Medicine

## 2020-03-09 MED ORDER — ROSUVASTATIN CALCIUM 10 MG PO TABS: 10.0000 mg | ORAL_TABLET | ORAL | 0 refills | Status: DC

## 2020-03-22 ENCOUNTER — Other Ambulatory Visit: Payer: Self-pay | Admitting: Family Medicine
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
## 2020-03-23 NOTE — Telephone Encounter (Signed)
Pt verbalized understanding.

## 2020-04-17 ENCOUNTER — Other Ambulatory Visit: Payer: Self-pay | Admitting: Family Medicine

## 2020-04-17 NOTE — Telephone Encounter (Signed)
Her vitamin D was normal. She does not need the big dose- she should just take 1000-2000 IU D3 OTC daily. Refusing med.

## 2020-04-17 NOTE — Telephone Encounter (Signed)
Requested Prescriptions  Pending Prescriptions Disp Refills  . Vitamin D, Ergocalciferol, (DRISDOL) 1.25 MG (50000 UNIT) CAPS capsule [Pharmacy Med Name: VITAMIN D (ERGOCALCIFEROL) 1.25 MG] 12 capsule     Sig: TAKE 1 CAPSULE EVERY 7 DAYS     Endocrinology:  Vitamins - Vitamin D Supplementation Failed - 04/17/2020 10:53 AM      Failed - 50,000 IU strengths are not delegated      Failed - Phosphate in normal range and within 360 days    No results found for: PHOS       Passed - Ca in normal range and within 360 days    Calcium  Date Value Ref Range Status  02/23/2020 9.2 8.7 - 10.3 mg/dL Final   Calcium, Total  Date Value Ref Range Status  08/30/2013 9.1 8.5 - 10.1 mg/dL Final         Passed - Vitamin D in normal range and within 360 days    Vit D, 25-Hydroxy  Date Value Ref Range Status  02/23/2020 54.0 30.0 - 100.0 ng/mL Final    Comment:    Vitamin D deficiency has been defined by the Institute of Medicine and an Endocrine Society practice guideline as a level of serum 25-OH vitamin D less than 20 ng/mL (1,2). The Endocrine Society went on to further define vitamin D insufficiency as a level between 21 and 29 ng/mL (2). 1. IOM (Institute of Medicine). 2010. Dietary reference    intakes for calcium and D. [place]: The    [employer]. 2. Holick MF, Binkley [employer], Bischoff-Ferrari HA, et al.    Evaluation, treatment, and prevention of vitamin D    deficiency: an Endocrine Society clinical practice    guideline. JCEM. 2011 Jul; 96(7):1911-30.          Passed - Valid encounter within last 12 months    Recent Outpatient Visits          1 month ago Routine general medical examination at a health care facility   [facility], [place] P, DO   5 months ago Upper respiratory tract infection, unspecified type   [facility] Valentino Nose, NP   5 months ago Burning with urination   [facility] Valentino Nose,  NP   5 months ago Essential hypertension   [facility] [place], [place], DO   7 months ago Dizziness   [facility] [place], [place], DO      Future Appointments            In 4 months Johnson, Megan P, DO [facility], [facility]           . meloxicam (MOBIC) 15 MG tablet [Pharmacy Med Name: MELOXICAM 15 MG TAB] 90 tablet     Sig: TAKE ONE TABLET BY MOUTH EVERY DAY     Analgesics:  COX2 Inhibitors Passed - 04/17/2020 10:53 AM      Passed - HGB in normal range and within 360 days    Hemoglobin  Date Value Ref Range Status  02/23/2020 13.4 11.1 - 15.9 g/dL Final         Passed - Cr in normal range and within 360 days    Creatinine  Date Value Ref Range Status  08/30/2013 0.64 0.60 - 1.30 mg/dL Final   Creatinine, Ser  Date Value Ref Range Status  02/23/2020 0.75 0.57 - 1.00 mg/dL Final         Passed - Patient  is not pregnant      Passed - Valid encounter within last 12 months    Recent Outpatient Visits          1 month ago Routine general medical examination at a health care facility   [facility], [place] P, DO   5 months ago Upper respiratory tract infection, unspecified type   [facility] Valentino Nose, NP   5 months ago Burning with urination   [facility] Valentino Nose, NP   5 months ago Essential hypertension   [facility] [place], [place], DO   7 months ago Dizziness   [facility] [place], [place], DO      Future Appointments            In 4 months Laural Benes, Oralia Rud, DO [facility], [facility]

## 2020-04-17 NOTE — Telephone Encounter (Signed)
Requested medication (s) are due for refill today: yes  Requested medication (s) are on the active medication list: yes  Last refill:  01/25/20   Future visit scheduled: yes  Notes to clinic:  med not delegated to NT to RF   Requested Prescriptions  Pending Prescriptions Disp Refills   Vitamin D, Ergocalciferol, (DRISDOL) 1.25 MG (50000 UNIT) CAPS capsule [Pharmacy Med Name: VITAMIN D (ERGOCALCIFEROL) 1.25 MG] 12 capsule     Sig: TAKE 1 CAPSULE EVERY 7 DAYS      Endocrinology:  Vitamins - Vitamin D Supplementation Failed - 04/17/2020 10:53 AM      Failed - 50,000 IU strengths are not delegated      Failed - Phosphate in normal range and within 360 days    No results found for: PHOS        Passed - Ca in normal range and within 360 days    Calcium  Date Value Ref Range Status  02/23/2020 9.2 8.7 - 10.3 mg/dL Final   Calcium, Total  Date Value Ref Range Status  08/30/2013 9.1 8.5 - 10.1 mg/dL Final          Passed - Vitamin D in normal range and within 360 days    Vit D, 25-Hydroxy  Date Value Ref Range Status  02/23/2020 54.0 30.0 - 100.0 ng/mL Final    Comment:    Vitamin D deficiency has been defined by the Institute of Medicine and an Endocrine Society practice guideline as a level of serum 25-OH vitamin D less than 20 ng/mL (1,2). The Endocrine Society went on to further define vitamin D insufficiency as a level between 21 and 29 ng/mL (2). 1. IOM (Institute of Medicine). 2010. Dietary reference    intakes for calcium and D. [place]: The    [employer]. 2. Holick MF, Binkley Odin, Bischoff-Ferrari HA, et al.    Evaluation, treatment, and prevention of vitamin D    deficiency: an Endocrine Society clinical practice    guideline. JCEM. 2011 Jul; 96(7):1911-30.           Passed - Valid encounter within last 12 months    Recent Outpatient Visits           1 month ago Routine general medical examination at a health care facility   [facility], [place] P, DO   5 months ago Upper respiratory tract infection, unspecified type   [facility] Valentino Nose, NP   5 months ago Burning with urination   [facility] Valentino Nose, NP   5 months ago Essential hypertension   [facility] [place], [place], DO   7 months ago Dizziness   [facility] [place], [place], DO       Future Appointments             In 4 months Johnson, Megan P, DO [facility], [facility]              Refused Prescriptions Disp Refills   meloxicam (MOBIC) 15 MG tablet [Pharmacy Med Name: MELOXICAM 15 MG TAB] 90 tablet     Sig: TAKE ONE TABLET BY MOUTH EVERY DAY      Analgesics:  COX2 Inhibitors Passed - 04/17/2020 10:53 AM      Passed - HGB in normal range and within 360 days    Hemoglobin  Date Value Ref Range Status  02/23/2020 13.4 11.1 - 15.9 g/dL Final  Passed - Cr in normal range and within 360 days    Creatinine  Date Value Ref Range Status  08/30/2013 0.64 0.60 - 1.30 mg/dL Final   Creatinine, Ser  Date Value Ref Range Status  02/23/2020 0.75 0.57 - 1.00 mg/dL Final          Passed - Patient is not pregnant      Passed - Valid encounter within last 12 months    Recent Outpatient Visits           1 month ago Routine general medical examination at a health care facility   [facility], [place] P, DO   5 months ago Upper respiratory tract infection, unspecified type   [facility] Valentino Nose, NP   5 months ago Burning with urination   [facility] Valentino Nose, NP   5 months ago Essential hypertension   [facility] [place], [place], DO   7 months ago Dizziness   [facility] [place], [place], DO       Future Appointments             In 4 months Laural Benes, Oralia Rud, DO [facility], [facility]

## 2020-04-26 ENCOUNTER — Encounter: Payer: Self-pay | Admitting: Family Medicine

## 2020-04-26 ENCOUNTER — Other Ambulatory Visit: Payer: Self-pay

## 2020-04-26 ENCOUNTER — Other Ambulatory Visit: Payer: Self-pay | Admitting: Family Medicine

## 2020-04-26 ENCOUNTER — Telehealth (INDEPENDENT_AMBULATORY_CARE_PROVIDER_SITE_OTHER): Payer: 59 | Admitting: Family Medicine

## 2020-04-26 VITALS — Temp 103.0°F

## 2020-04-26 DIAGNOSIS — R509 Fever, unspecified: Secondary | ICD-10-CM | POA: Diagnosis not present

## 2020-04-26 DIAGNOSIS — Z20822 Contact with and (suspected) exposure to covid-19: Secondary | ICD-10-CM | POA: Diagnosis not present

## 2020-04-26 MED ORDER — BENZONATATE 200 MG PO CAPS
200.0000 mg | ORAL_CAPSULE | Freq: Two times a day (BID) | ORAL | 0 refills | Status: DC | PRN
Start: 2020-04-26 — End: 2020-04-26

## 2020-04-26 MED ORDER — PREDNISONE 10 MG PO TABS: ORAL_TABLET | ORAL | 0 refills | Status: DC

## 2020-04-26 MED ORDER — PREDNISONE 10 MG PO TABS
ORAL_TABLET | ORAL | 0 refills | Status: DC
Start: 2020-04-26 — End: 2020-04-26

## 2020-04-26 MED ORDER — BENZONATATE 200 MG PO CAPS: 200.0000 mg | ORAL_CAPSULE | Freq: Two times a day (BID) | ORAL | 0 refills | Status: DC | PRN

## 2020-04-26 MED ORDER — HYDROCOD POLST-CPM POLST ER 10-8 MG/5ML PO SUER: 5.0000 mL | Freq: Two times a day (BID) | ORAL | 0 refills | Status: DC | PRN

## 2020-04-26 NOTE — Progress Notes (Signed)
Temp (!) 103 F (39.4 C) (Oral)    Subjective:    Patient ID: [name], female    DOB: 08-11-1955, 65 y.o.   MRN: [id number]  HPI: [name] is a 65 y.o. female  Chief Complaint  Patient presents with  . Covid Exposure    Pt states she was exposed to COVID on Monday. Has started having chills, body aches, cough, sore throat, and fever. Has been drinking fluids and taking tylenol for fever.    UPPER RESPIRATORY TRACT INFECTION Duration: 1 day Worst symptom: fever Fever: yes- up to 103 Cough: yes Shortness of breath: yes Wheezing: no Chest pain: no Chest tightness: no Chest congestion: no Nasal congestion: no Runny nose: yes Post nasal drip: yes Sneezing: no Sore throat: yes Swollen glands: no Sinus pressure: no Headache: yes Face pain: no Toothache: no Ear pain: no  Ear pressure: no  Eyes red/itching:no Eye drainage/crusting: no  Vomiting: no Rash: no Fatigue: yes Sick contacts: yes Strep contacts: no  Context: worse Recurrent sinusitis: no Relief with OTC cold/cough medications: no  Treatments attempted: tylenol,    Relevant past medical, surgical, family and social history reviewed and updated as indicated. Interim medical history since our last visit reviewed. Allergies and medications reviewed and updated.  Review of Systems  Constitutional: Positive for chills, diaphoresis, fatigue and fever. Negative for activity change, appetite change and unexpected weight change.  HENT: Positive for congestion, postnasal drip, rhinorrhea and sinus pressure. Negative for dental problem, drooling, ear discharge, ear pain, facial swelling, hearing loss, mouth sores, nosebleeds, sinus pain, sneezing, sore throat, tinnitus, trouble swallowing and voice change.   Respiratory: Positive for cough and shortness of breath. Negative for apnea, choking, chest tightness, wheezing and stridor.   Cardiovascular: Negative.   Gastrointestinal: Negative.    Musculoskeletal: Negative.   Psychiatric/Behavioral: Negative.     Per HPI unless specifically indicated above     Objective:    Temp (!) 103 F (39.4 C) (Oral)   Wt Readings from Last 3 Encounters:  02/23/20 196 lb 9.6 oz (89.2 kg)  12/02/19 195 lb (88.5 kg)  11/19/19 192 lb (87.1 kg)    Physical Exam Vitals and nursing note reviewed.  Pulmonary:     Effort: Pulmonary effort is normal. No respiratory distress.     Comments: Speaking in full sentences Neurological:     Mental Status: She is alert.  Psychiatric:        Mood and Affect: Mood normal.        Behavior: Behavior normal.        Thought Content: Thought content normal.        Judgment: Judgment normal.     Results for orders placed or performed in visit on 02/23/20  Bayer DCA Hb A1c Waived  Result Value Ref Range   HB A1C (BAYER DCA - WAIVED) 5.2 <7.0 %  CBC with Differential/Platelet  Result Value Ref Range   WBC 4.2 3.4 - 10.8 x10E3/uL   RBC 4.21 3.77 - 5.28 x10E6/uL   Hemoglobin 13.4 11.1 - 15.9 g/dL   Hematocrit 59.1 63.8 - 46.6 %   MCV 92 79 - 97 fL   MCH 31.8 26.6 - 33.0 pg   MCHC 34.7 31.5 - 35.7 g/dL   RDW 46.6 (L) 59.9 - 35.7 %   Platelets 248 150 - 450 x10E3/uL   Neutrophils 39 Not Estab. %   Lymphs 45 Not Estab. %   Monocytes 8 Not Estab. %  Eos 7 Not Estab. %   Basos 1 Not Estab. %   Neutrophils Absolute 1.6 1.4 - 7.0 x10E3/uL   Lymphocytes Absolute 1.9 0.7 - 3.1 x10E3/uL   Monocytes Absolute 0.3 0.1 - 0.9 x10E3/uL   EOS (ABSOLUTE) 0.3 0.0 - 0.4 x10E3/uL   Basophils Absolute 0.0 0.0 - 0.2 x10E3/uL   Immature Granulocytes 0 Not Estab. %   Immature Grans (Abs) 0.0 0.0 - 0.1 x10E3/uL  Comprehensive metabolic panel  Result Value Ref Range   Glucose 121 (H) 65 - 99 mg/dL   BUN 16 8 - 27 mg/dL   Creatinine, Ser 3.82 0.57 - 1.00 mg/dL   GFR calc non Af Amer 84 >59 mL/min/1.73   GFR calc Af Amer 97 >59 mL/min/1.73   BUN/Creatinine Ratio 21 12 - 28   Sodium 142 134 - 144 mmol/L    Potassium 3.5 3.5 - 5.2 mmol/L   Chloride 101 96 - 106 mmol/L   CO2 28 20 - 29 mmol/L   Calcium 9.2 8.7 - 10.3 mg/dL   Total Protein 6.0 6.0 - 8.5 g/dL   Albumin 4.1 3.8 - 4.8 g/dL   Globulin, Total 1.9 1.5 - 4.5 g/dL   Albumin/Globulin Ratio 2.2 1.2 - 2.2   Bilirubin Total 0.6 0.0 - 1.2 mg/dL   Alkaline Phosphatase 66 44 - 121 IU/L   AST 17 0 - 40 IU/L   ALT 21 0 - 32 IU/L  Lipid Panel w/o Chol/HDL Ratio  Result Value Ref Range   Cholesterol, Total 167 100 - 199 mg/dL   Triglycerides 88 0 - 149 mg/dL   HDL 67 >50 mg/dL   VLDL Cholesterol Cal 16 5 - 40 mg/dL   LDL Chol Calc (NIH) 84 0 - 99 mg/dL  Microalbumin, Urine Waived  Result Value Ref Range   Microalb, Ur Waived 30 (H) 0 - 19 mg/L   Creatinine, Urine Waived 300 10 - 300 mg/dL   Microalb/Creat Ratio <30 <30 mg/g  TSH  Result Value Ref Range   TSH 1.610 0.450 - 4.500 uIU/mL  Urinalysis, Routine w reflex microscopic  Result Value Ref Range   Specific Gravity, UA >1.030 (H) 1.005 - 1.030   pH, UA 5.5 5.0 - 7.5   Color, UA Yellow Yellow   Appearance Ur Clear Clear   Leukocytes,UA Negative Negative   Protein,UA Negative Negative/Trace   Glucose, UA Negative Negative   Ketones, UA Negative Negative   RBC, UA Negative Negative   Bilirubin, UA Negative Negative   Urobilinogen, Ur 1.0 0.2 - 1.0 mg/dL   Nitrite, UA Negative Negative  VITAMIN D 25 Hydroxy (Vit-D Deficiency, Fractures)  Result Value Ref Range   Vit D, 25-Hydroxy 54.0 30.0 - 100.0 ng/mL      Assessment & Plan:   Problem List Items Addressed This Visit   None   Visit Diagnoses    Suspected COVID-19 virus infection    -  Primary   Will get her swabbed. Self-quarantine until results are back. Will treat symptomatically with prednisone, tussionex and tessalon. Call with concerns or worse.   Fever, unspecified fever cause       Relevant Orders   Novel Coronavirus, NAA ([facility])       Follow up plan: Return if symptoms worsen or fail to  improve.     . This visit was completed via telephone due to the restrictions of the COVID-19 pandemic. All issues as above were discussed and addressed but no physical exam was performed. If it was  felt that the patient should be evaluated in the office, they were directed there. The patient verbally consented to this visit. Patient was unable to complete an audio/visual visit due to Lack of equipment. Due to the catastrophic nature of the COVID-19 pandemic, this visit was done through audio contact only. . Location of the patient: home . Location of the provider: work . Those involved with this call:  . Provider: Park Liter, DO . CMA: Yvonna Alanis, [place] . Front Desk/Registration: Barth Kirks  . Time spent on call: 21 minutes with patient face to face via video conference. More than 50% of this time was spent in counseling and coordination of care. 30 minutes total spent in review of patient's record and preparation of their chart.

## 2020-04-27 ENCOUNTER — Other Ambulatory Visit: Payer: 59

## 2020-04-27 NOTE — Telephone Encounter (Signed)
Confirmed with patient that she is still taking this medication for her knees.

## 2020-04-27 NOTE — Telephone Encounter (Signed)
  Notes to clinic: medication filled by a different provider  Review for refill    Requested Prescriptions  Pending Prescriptions Disp Refills   meloxicam (MOBIC) 15 MG tablet [Pharmacy Med Name: MELOXICAM 15 MG TAB] 90 tablet     Sig: TAKE ONE TABLET BY MOUTH EVERY DAY      Analgesics:  COX2 Inhibitors Passed - 04/26/2020  8:02 PM      Passed - HGB in normal range and within 360 days    Hemoglobin  Date Value Ref Range Status  02/23/2020 13.4 11.1 - 15.9 g/dL Final          Passed - Cr in normal range and within 360 days    Creatinine  Date Value Ref Range Status  08/30/2013 0.64 0.60 - 1.30 mg/dL Final   Creatinine, Ser  Date Value Ref Range Status  02/23/2020 0.75 0.57 - 1.00 mg/dL Final          Passed - Patient is not pregnant      Passed - Valid encounter within last 12 months    Recent Outpatient Visits           Yesterday Suspected COVID-19 virus infection   [facility] [place], Megan P, DO   2 months ago Routine general medical examination at a health care facility   [facility] [place] P, DO   5 months ago Upper respiratory tract infection, unspecified type   [facility] Valentino Nose, NP   5 months ago Burning with urination   [facility] Valentino Nose, NP   6 months ago Essential hypertension   [facility] [place], Bracey, DO       Future Appointments             In 3 months Laural Benes, Oralia Rud, DO [facility]

## 2020-04-28 ENCOUNTER — Telehealth: Payer: Self-pay

## 2020-04-28 LAB — NOVEL CORONAVIRUS, NAA: SARS-CoV-2, NAA: DETECTED — AB

## 2020-04-28 LAB — SARS-COV-2, NAA 2 DAY TAT

## 2020-04-28 NOTE — Telephone Encounter (Signed)
Pt called in stating that she was on the line with cheryl she thinks and the line was disconnected. She states that she thinks they were done but wants her to know that if she needs her for anything else she can call her.

## 2020-04-30 DIAGNOSIS — U071 COVID-19: Secondary | ICD-10-CM | POA: Insufficient documentation

## 2020-05-02 ENCOUNTER — Encounter: Payer: Self-pay | Admitting: Family Medicine

## 2020-05-02 ENCOUNTER — Other Ambulatory Visit: Payer: Self-pay | Admitting: Family Medicine

## 2020-05-02 NOTE — Telephone Encounter (Signed)
Requested medication (s) are due for refill today - expired Rx  Requested medication (s) are on the active medication list -no  Future visit scheduled -yes  Last refill: 2017  Notes to clinic: Request RF of expired Rx- sent for review of request  Requested Prescriptions  Pending Prescriptions Disp Refills   albuterol (PROVENTIL) (2.5 MG/3ML) 0.083% nebulizer solution [Pharmacy Med Name: ALBUTEROL SULFATE 0.083% INH SOL ML] 75 mL     Sig: USE 1 VIAL VIA NEBULIZER 1 TO 2 TIMES A DAY AS DIRECTED      Pulmonology:  Beta Agonists Failed - 05/02/2020 10:01 AM      Failed - One inhaler should last at least one month. If the patient is requesting refills earlier, contact the patient to check for uncontrolled symptoms.      Passed - Valid encounter within last 12 months    Recent Outpatient Visits           6 days ago Suspected COVID-19 virus infection   [facility], [place], DO   2 months ago Routine general medical examination at a health care facility   [facility], [place] P, DO   5 months ago Upper respiratory tract infection, unspecified type   [facility] Valentino Nose, NP   6 months ago Burning with urination   [facility] Valentino Nose, NP   6 months ago Essential hypertension   [facility] [place], [place], DO       Future Appointments             In 3 months Johnson, Megan P, DO [facility]                 Requested Prescriptions  Pending Prescriptions Disp Refills   albuterol (PROVENTIL) (2.5 MG/3ML) 0.083% nebulizer solution [Pharmacy Med Name: ALBUTEROL SULFATE 0.083% INH SOL ML] 75 mL     Sig: USE 1 VIAL VIA NEBULIZER 1 TO 2 TIMES A DAY AS DIRECTED      Pulmonology:  Beta Agonists Failed - 05/02/2020 10:01 AM      Failed - One inhaler should last at least one month. If the patient is requesting refills earlier, contact the patient to check for  uncontrolled symptoms.      Passed - Valid encounter within last 12 months    Recent Outpatient Visits           6 days ago Suspected COVID-19 virus infection   [facility] [place], Megan P, DO   2 months ago Routine general medical examination at a health care facility   [facility], [place] P, DO   5 months ago Upper respiratory tract infection, unspecified type   [facility] Valentino Nose, NP   6 months ago Burning with urination   [facility] Valentino Nose, NP   6 months ago Essential hypertension   [facility] [place], Oralia Rud, DO       Future Appointments             In 3 months Laural Benes, Oralia Rud, DO [employer] [facility]

## 2020-05-05 ENCOUNTER — Encounter: Payer: Self-pay | Admitting: Family Medicine

## 2020-05-11 ENCOUNTER — Other Ambulatory Visit: Payer: Self-pay | Admitting: Family Medicine

## 2020-05-11 NOTE — Telephone Encounter (Signed)
Patient does NOT need large dose of vitamin D. Please let her know she should just be taking OTC D3. Thanks!

## 2020-05-11 NOTE — Telephone Encounter (Signed)
Requested medications are due for refill today.  Yes  Requested medications are on the active medications list.  Yes  Last refill. 01/25/2020  Future visit scheduled.   Yes  Notes to clinic.  Not delegated.

## 2020-05-11 NOTE — Telephone Encounter (Signed)
Patient notified and is aware 

## 2020-06-05 ENCOUNTER — Encounter: Payer: Self-pay | Admitting: Family Medicine

## 2020-06-05 ENCOUNTER — Other Ambulatory Visit: Payer: Self-pay

## 2020-06-05 ENCOUNTER — Telehealth (INDEPENDENT_AMBULATORY_CARE_PROVIDER_SITE_OTHER): Payer: 59 | Admitting: Family Medicine

## 2020-06-05 ENCOUNTER — Ambulatory Visit
Admission: RE | Admit: 2020-06-05 | Discharge: 2020-06-05 | Disposition: A | Discharge status: Home / Self Care | Payer: 59 | Source: Ambulatory Visit | Attending: Family Medicine | Admitting: Family Medicine

## 2020-06-05 VITALS — BP 112/79 | Temp 102.5°F

## 2020-06-05 DIAGNOSIS — R1084 Generalized abdominal pain: Secondary | ICD-10-CM | POA: Insufficient documentation

## 2020-06-05 DIAGNOSIS — R112 Nausea with vomiting, unspecified: Secondary | ICD-10-CM

## 2020-06-05 DIAGNOSIS — R509 Fever, unspecified: Secondary | ICD-10-CM | POA: Insufficient documentation

## 2020-06-05 LAB — POCT I-STAT CREATININE: Creatinine, Ser: 0.7 mg/dL (ref 0.44–1.00)

## 2020-06-05 MED ORDER — ONDANSETRON HCL 8 MG PO TABS
8.0000 mg | ORAL_TABLET | Freq: Three times a day (TID) | ORAL | 0 refills | Status: DC | PRN
Start: 2020-06-05 — End: 2020-11-30

## 2020-06-05 MED ORDER — IOHEXOL 300 MG/ML  SOLN
100.0000 mL | Freq: Once | INTRAMUSCULAR | Status: AC | PRN
  Administered 2020-06-05: 100 mL via INTRAVENOUS

## 2020-06-05 NOTE — Addendum Note (Signed)
Addended by: Dorcas Carrow on: 06/05/2020 07:53 PM   Modules accepted: Orders

## 2020-06-05 NOTE — Progress Notes (Signed)
BP 112/79   Temp (!) 102.5 F (39.2 C) (Oral)    Subjective:    Patient ID: [name], female    DOB: 05-24-1955, 65 y.o.   MRN: [id number]  HPI: [name] is a 65 y.o. female  Chief Complaint  Patient presents with  . Emesis    Pt states she has been up since 3 am with vomiting and has not stopped, she is not able to keep anything down.   . Fever    Pt states she has tried over the counter Tylenol. Recent fever reading 102.5. Pt states the medication isn't helping at all.   . Headache   ABDOMINAL ISSUES Duration: started last night Nature: sharp and aching Location: diffuse  Severity: moderate  Radiation: no Episode duration: Frequency: constant Alleviating factors: Aggravating factors: Treatments attempted: none Constipation: yes Diarrhea: yes Episodes of diarrhea/day: 2  Mucous in the stool: no Heartburn: no Bloating:yes Flatulence: yes Nausea: yes Vomiting: yes Episodes of vomit/day: dozens Melena or hematochezia: no Rash: no Jaundice: no Fever: yes Weight loss: no   Relevant past medical, surgical, family and social history reviewed and updated as indicated. Interim medical history since our last visit reviewed. Allergies and medications reviewed and updated.  Review of Systems  Constitutional: Positive for fatigue and fever. Negative for activity change, appetite change, chills, diaphoresis and unexpected weight change.  HENT: Negative.   Respiratory: Negative.   Cardiovascular: Negative.   Gastrointestinal: Positive for abdominal distention, abdominal pain, diarrhea, nausea and vomiting. Negative for anal bleeding, blood in stool, constipation and rectal pain.  Skin: Negative.   Neurological: Negative.   Psychiatric/Behavioral: Negative.     Per HPI unless specifically indicated above     Objective:    BP 112/79   Temp (!) 102.5 F (39.2 C) (Oral)   Wt Readings from Last 3 Encounters:  02/23/20 196 lb 9.6 oz (89.2 kg)   12/02/19 195 lb (88.5 kg)  11/19/19 192 lb (87.1 kg)    Physical Exam Vitals and nursing note reviewed.  Constitutional:      General: She is not in acute distress.    Appearance: Normal appearance. She is ill-appearing. She is not toxic-appearing or diaphoretic.  HENT:     Head: Normocephalic and atraumatic.     Right Ear: External ear normal.     Left Ear: External ear normal.     Nose: Nose normal.     Mouth/Throat:     Mouth: Mucous membranes are moist.     Pharynx: Oropharynx is clear.  Eyes:     General: No scleral icterus.       Right eye: No discharge.        Left eye: No discharge.     Conjunctiva/sclera: Conjunctivae normal.     Pupils: Pupils are equal, round, and reactive to light.  Pulmonary:     Effort: Pulmonary effort is normal. No respiratory distress.     Comments: Speaking in full sentences Musculoskeletal:        General: Normal range of motion.     Cervical back: Normal range of motion.  Skin:    Coloration: Skin is not jaundiced or pale.     Findings: No bruising, erythema, lesion or rash.  Neurological:     Mental Status: She is alert and oriented to person, place, and time. Mental status is at baseline.  Psychiatric:        Mood and Affect: Mood normal.  Behavior: Behavior normal.        Thought Content: Thought content normal.        Judgment: Judgment normal.     Results for orders placed or performed in visit on 04/26/20  Novel Coronavirus, NAA ([facility])   Specimen: Nasopharyngeal(NP) swabs in vial transport medium  Result Value Ref Range   SARS-CoV-2, NAA Detected (A) Not Detected  SARS-COV-2, NAA 2 DAY TAT  Result Value Ref Range   SARS-CoV-2, NAA 2 DAY TAT Performed       Assessment & Plan:   Problem List Items Addressed This Visit   None   Visit Diagnoses    Generalized abdominal pain    -  Primary   Significant concern for intra-abdominal process. Will obtain CT abdomen to r/o appendicitis, pyleo or diverticitulitis.  Zofran to pharmacy. ER precautions given   Relevant Orders   CT Abdomen Pelvis W Contrast   Nausea and vomiting, intractability of vomiting not specified, unspecified vomiting type       Relevant Orders   CT Abdomen Pelvis W Contrast   Fever, unspecified fever cause       Relevant Orders   CT Abdomen Pelvis W Contrast       Follow up plan: Return Pending results..   . This visit was completed via MyChart due to the restrictions of the COVID-19 pandemic. All issues as above were discussed and addressed. Physical exam was done as above through visual confirmation on MyChart. If it was felt that the patient should be evaluated in the office, they were directed there. The patient verbally consented to this visit. . Location of the patient: home . Location of the provider: work . Those involved with this call:  . Provider: Olevia Perches, DO . CMA: Malen Gauze, CMA . Front Desk/Registration: Harriet Pho  . Time spent on call: 15 minutes with patient face to face via video conference. More than 50% of this time was spent in counseling and coordination of care. 23 minutes total spent in review of patient's record and preparation of their chart.

## 2020-06-05 NOTE — Progress Notes (Signed)
Called patient with the results. She was already home. No identifiable cause of her fever. Will get her in tomorrow for Flu and covid swab at 11 AM

## 2020-06-06 LAB — VERITOR FLU A/B WAIVED
Influenza A: NEGATIVE
Influenza B: NEGATIVE

## 2020-06-06 NOTE — Addendum Note (Signed)
Addended by: Pablo Ledger on: 06/06/2020 11:16 AM   Modules accepted: Orders

## 2020-06-07 LAB — SARS-COV-2, NAA 2 DAY TAT

## 2020-06-07 LAB — NOVEL CORONAVIRUS, NAA: SARS-CoV-2, NAA: NOT DETECTED

## 2020-06-13 ENCOUNTER — Encounter: Payer: Self-pay | Admitting: Family Medicine

## 2020-08-09 ENCOUNTER — Other Ambulatory Visit: Payer: Self-pay | Admitting: Family Medicine

## 2020-08-09 NOTE — Telephone Encounter (Signed)
Future visit in 1 week  

## 2020-08-11 ENCOUNTER — Telehealth: Payer: Self-pay

## 2020-08-11 NOTE — Telephone Encounter (Signed)
Called patient to ask about Myrbetriq PA we received. Patient states she is taking the medication and is fine right now. She states she has follow up in May and will need refill then.

## 2020-08-15 ENCOUNTER — Telehealth: Payer: Self-pay

## 2020-08-15 ENCOUNTER — Ambulatory Visit: Payer: Self-pay | Admitting: *Deleted

## 2020-08-15 NOTE — Telephone Encounter (Signed)
  [place] the beach.   She will be back on Sunday.  She can be reached via her MyChart or phone if the doctor needs to get in touch with her prior to her return on Sunday from the beach.  She has an appt with Dr.Johnson on Aug 29, 2020 at 2:20 for a physical.   Her appt was changed from May 3 to 10th.  She is concerned about her feet for the last month have been turning purple when they are hanging down.   When she props her feet up the purple goes away.   She is also having a lot of cramping in her calves that is getting worse.    I let her know I would pass this information along to Dr. Olevia Perches as she has requested.   "I didn't know if it was something she would want to know in advance or not".    I have forwarded my notes to [facility] for Dr. Aundra Millet Johnson's information. Reason for Disposition . Leg pain or muscle cramp is a chronic symptom (recurrent or ongoing AND present > 4 weeks)    Cramping in calves for a month.   Feet turning purple in a dependent position.   Goes away when feet are propped up for the last month.  Answer Assessment - Initial Assessment Questions 1. ONSET: "When did the pain start?"      A month now her feet have been turning purple when they are in a down position.   When she props her feet up the purple discoloration goes away.   She is also having cramping in her calves that is getting worse. 2. LOCATION: "Where is the pain located?"      Calves are cramping 3. PAIN: "How bad is the pain?"    (Scale 1-10; or mild, moderate, severe)  - MILD (1-3): doesn't interfere with normal activities.   - MODERATE (4-7): interferes with normal activities (e.g., work or school) or awakens from sleep, limping.   - SEVERE (8-10): excruciating pain, unable to do any normal activities, unable to walk.      Moderate.   Happening on a daily basis most of the time. 4. WORK OR EXERCISE: "Has there been any recent work or exercise that involved this part of  the body?"      No 5. CAUSE: "What do you think is causing the foot pain?"     "I don't know" 6. OTHER SYMPTOMS: "Do you have any other symptoms?" (e.g., leg pain, rash, fever, numbness)     No 7. PREGNANCY: "Is there any chance you are pregnant?" "When was your last menstrual period?"     N/A due to age  Protocols used: LEG PAIN-A-AH, FOOT PAIN-A-AH

## 2020-08-15 NOTE — Telephone Encounter (Signed)
Pt stated she did not want to reschedule apt with her PCP asking if Dr.Johnson would take her on as a patient. Please advise.

## 2020-08-15 NOTE — Telephone Encounter (Signed)
Lvm to r/s 5/3 appt to Dr Charlotta Newton

## 2020-08-22 ENCOUNTER — Ambulatory Visit: Payer: 59 | Admitting: Family Medicine

## 2020-08-24 ENCOUNTER — Encounter: Payer: Self-pay | Admitting: Family Medicine

## 2020-08-29 ENCOUNTER — Ambulatory Visit: Payer: 59 | Admitting: Family Medicine

## 2020-08-29 ENCOUNTER — Encounter: Payer: Self-pay | Admitting: Family Medicine

## 2020-08-29 ENCOUNTER — Other Ambulatory Visit: Payer: Self-pay

## 2020-08-29 VITALS — BP 141/83 | HR 84 | Temp 99.1°F | Wt 191.0 lb

## 2020-08-29 DIAGNOSIS — R609 Edema, unspecified: Secondary | ICD-10-CM | POA: Diagnosis not present

## 2020-08-29 DIAGNOSIS — I10 Essential (primary) hypertension: Secondary | ICD-10-CM | POA: Diagnosis not present

## 2020-08-29 DIAGNOSIS — E1169 Type 2 diabetes mellitus with other specified complication: Secondary | ICD-10-CM

## 2020-08-29 DIAGNOSIS — J4530 Mild persistent asthma, uncomplicated: Secondary | ICD-10-CM | POA: Diagnosis not present

## 2020-08-29 DIAGNOSIS — E559 Vitamin D deficiency, unspecified: Secondary | ICD-10-CM

## 2020-08-29 DIAGNOSIS — F339 Major depressive disorder, recurrent, unspecified: Secondary | ICD-10-CM

## 2020-08-29 DIAGNOSIS — F3342 Major depressive disorder, recurrent, in full remission: Secondary | ICD-10-CM

## 2020-08-29 DIAGNOSIS — N3946 Mixed incontinence: Secondary | ICD-10-CM

## 2020-08-29 DIAGNOSIS — E785 Hyperlipidemia, unspecified: Secondary | ICD-10-CM

## 2020-08-29 LAB — BAYER DCA HB A1C WAIVED: HB A1C (BAYER DCA - WAIVED): 5.7 % (ref <7.0)

## 2020-08-29 MED ORDER — TRELEGY ELLIPTA 100-62.5-25 MCG/INH IN AEPB: 1.0000 | INHALATION_SPRAY | Freq: Every day | RESPIRATORY_TRACT | 4 refills | Status: DC

## 2020-08-29 MED ORDER — ALBUTEROL SULFATE (2.5 MG/3ML) 0.083% IN NEBU: INHALATION_SOLUTION | RESPIRATORY_TRACT | 1 refills | Status: DC

## 2020-08-29 MED ORDER — BENZONATATE 200 MG PO CAPS: 200.0000 mg | ORAL_CAPSULE | Freq: Two times a day (BID) | ORAL | 0 refills | Status: DC | PRN

## 2020-08-29 MED ORDER — ALBUTEROL SULFATE HFA 108 (90 BASE) MCG/ACT IN AERS: 2.0000 | INHALATION_SPRAY | Freq: Four times a day (QID) | RESPIRATORY_TRACT | 2 refills | Status: DC | PRN

## 2020-08-29 MED ORDER — BUPROPION HCL ER (SR) 150 MG PO TB12: 150.0000 mg | ORAL_TABLET | Freq: Three times a day (TID) | ORAL | 1 refills | Status: DC

## 2020-08-29 MED ORDER — MYRBETRIQ 50 MG PO TB24: 50.0000 mg | ORAL_TABLET | Freq: Every day | ORAL | 1 refills | Status: DC

## 2020-08-29 MED ORDER — OMEPRAZOLE 40 MG PO CPDR: 40.0000 mg | DELAYED_RELEASE_CAPSULE | Freq: Every day | ORAL | 1 refills | Status: DC

## 2020-08-29 MED ORDER — HYOSCYAMINE SULFATE 0.125 MG SL SUBL: SUBLINGUAL_TABLET | SUBLINGUAL | 1 refills | Status: DC

## 2020-08-29 MED ORDER — FLUTICASONE PROPIONATE 50 MCG/ACT NA SUSP: 2.0000 | Freq: Two times a day (BID) | NASAL | 4 refills | Status: DC

## 2020-08-29 MED ORDER — LORAZEPAM 1 MG PO TABS: ORAL_TABLET | ORAL | 0 refills | Status: DC

## 2020-08-29 MED ORDER — MELOXICAM 15 MG PO TABS: 1.0000 | ORAL_TABLET | Freq: Every day | ORAL | 1 refills | Status: DC

## 2020-08-29 MED ORDER — ROSUVASTATIN CALCIUM 10 MG PO TABS: 5.0000 mg | ORAL_TABLET | ORAL | 0 refills | Status: DC

## 2020-08-29 MED ORDER — HYDROCHLOROTHIAZIDE 50 MG PO TABS: 50.0000 mg | ORAL_TABLET | Freq: Every day | ORAL | 1 refills | Status: DC

## 2020-08-29 NOTE — Patient Instructions (Addendum)
Call to schedule your mammogram and bone density [facility]  Address: [address], [place], [place] [address]  Phone: [phone]  

## 2020-08-29 NOTE — Progress Notes (Signed)
BP (!) 141/83   Pulse 84   Temp 99.1 F (37.3 C)   Wt 191 lb (86.6 kg)   SpO2 98%   BMI 40.62 kg/m    Subjective:    Patient ID: [name], female    DOB: [birth year], 65 y.o.   MRN: [id number]  HPI: [name] is a 65 y.o. female  Chief Complaint  Patient presents with  . Diabetes  . Hypertension  . Hyperlipidemia  . Depression  . Anxiety  . bladder leakage    Patient needs refill on medication. Also would like to see about increasing dosage.   . Numbness    Patient states her feet feel tingling and numb, turn purple and swell at night time. Patient states she also is getting leg cramps at night    Has been having some numbness, swelling and color change to purple in her feet since about February or March after she had COVID for the 2nd time. Happening every day in the evening. Goes away when she puts her feet up. Still happens when she keeps her feet up. Has not tried and  Not painful. Tingle a little. Toes to the arch of her foot turn purple. Seems to be happening more when she's at the beach and when she's on her feet the whole day. No SOB. No heat.   DIABETES Hypoglycemic episodes:no Polydipsia/polyuria: no Visual disturbance: no Chest pain: no Paresthesias: yes- all the time Glucose Monitoring: no  Accucheck frequency: Not Checking Taking Insulin?: no Blood Pressure Monitoring: not checking Retinal Examination: Up to Date Foot Exam: Up to Date Diabetic Education: Completed Pneumovax: Up to Date Influenza: Up to Date Aspirin: no  HYPERTENSION / HYPERLIPIDEMIA Satisfied with current treatment? yes Duration of hypertension: chronic BP monitoring frequency: not checking BP medication side effects: no Past BP meds: HCTZ Duration of hyperlipidemia: chronic Cholesterol medication side effects: no Cholesterol supplements: none Past cholesterol medications: crestor Medication compliance: fair compliance Aspirin: no Recent stressors:  no Recurrent headaches: no Visual changes: no Palpitations: no Dyspnea: no Chest pain: no Lower extremity edema: yes Dizzy/lightheaded: no  DEPRESSION Mood status: controlled Satisfied with current treatment?: yes Symptom severity: mild  Duration of current treatment : chronic Side effects: no Medication compliance: excellent compliance Psychotherapy/counseling: no  Previous psychiatric medications: wellbutrin Depressed mood: no Anxious mood: no Anhedonia: no Significant weight loss or gain: no Insomnia: no  Fatigue: no Feelings of worthlessness or guilt: no Impaired concentration/indecisiveness: no Suicidal ideations: no Hopelessness: no Crying spells: no Depression screen [facility] 2/9 08/29/2020 02/23/2020 10/22/2019 09/10/2019 06/09/2019  Decreased Interest 0 0 0 1 0  Down, Depressed, Hopeless 0 0 0 1 0  PHQ - 2 Score 0 0 0 2 0  Altered sleeping 0 0 0 1 0  Tired, decreased energy 0 0 0 1 0  Change in appetite 0 0 0 1 0  Feeling bad or failure about yourself  0 0 0 1 0  Trouble concentrating 0 0 0 0 0  Moving slowly or fidgety/restless 0 0 0 0 0  Suicidal thoughts 0 0 0 0 0  PHQ-9 Score 0 0 0 6 0  Difficult doing work/chores - Not difficult at all - Somewhat difficult Not difficult at all  Some recent data might be hidden    Relevant past medical, surgical, family and social history reviewed and updated as indicated. Interim medical history since our last visit reviewed. Allergies and medications reviewed and updated.  Review of Systems  Constitutional:  Negative.   Respiratory: Negative.   Cardiovascular: Negative.   Gastrointestinal: Negative.   Musculoskeletal: Negative.   Neurological: Negative.   Psychiatric/Behavioral: Negative.     Per HPI unless specifically indicated above     Objective:    BP (!) 141/83   Pulse 84   Temp 99.1 F (37.3 C)   Wt 191 lb (86.6 kg)   SpO2 98%   BMI 40.62 kg/m   Wt Readings from Last 3 Encounters:  08/29/20 191 lb  (86.6 kg)  02/23/20 196 lb 9.6 oz (89.2 kg)  12/02/19 195 lb (88.5 kg)    Physical Exam Vitals and nursing note reviewed.  Constitutional:      General: She is not in acute distress.    Appearance: Normal appearance. She is not ill-appearing, toxic-appearing or diaphoretic.  HENT:     Head: Normocephalic and atraumatic.     Right Ear: External ear normal.     Left Ear: External ear normal.     Nose: Nose normal.     Mouth/Throat:     Mouth: Mucous membranes are moist.     Pharynx: Oropharynx is clear.  Eyes:     General: No scleral icterus.       Right eye: No discharge.        Left eye: No discharge.     Extraocular Movements: Extraocular movements intact.     Conjunctiva/sclera: Conjunctivae normal.     Pupils: Pupils are equal, round, and reactive to light.  Cardiovascular:     Rate and Rhythm: Normal rate and regular rhythm.     Pulses: Normal pulses.     Heart sounds: Normal heart sounds. No murmur heard. No friction rub. No gallop.   Pulmonary:     Effort: Pulmonary effort is normal. No respiratory distress.     Breath sounds: Normal breath sounds. No stridor. No wheezing, rhonchi or rales.  Chest:     Chest wall: No tenderness.  Musculoskeletal:        General: Normal range of motion.     Cervical back: Normal range of motion and neck supple.  Skin:    General: Skin is warm and dry.     Capillary Refill: Capillary refill takes less than 2 seconds.     Coloration: Skin is not jaundiced or pale.     Findings: No bruising, erythema, lesion or rash.  Neurological:     General: No focal deficit present.     Mental Status: She is alert and oriented to person, place, and time. Mental status is at baseline.  Psychiatric:        Mood and Affect: Mood normal.        Behavior: Behavior normal.        Thought Content: Thought content normal.        Judgment: Judgment normal.     Results for orders placed or performed in visit on 08/29/20  Bayer DCA Hb A1c Waived   Result Value Ref Range   HB A1C (BAYER DCA - WAIVED) 5.7 <7.0 %  CBC with Differential/Platelet  Result Value Ref Range   WBC 6.2 3.4 - 10.8 x10E3/uL   RBC 4.51 3.77 - 5.28 x10E6/uL   Hemoglobin 14.1 11.1 - 15.9 g/dL   Hematocrit 41.1 34.0 - 46.6 %   MCV 91 79 - 97 fL   MCH 31.3 26.6 - 33.0 pg   MCHC 34.3 31.5 - 35.7 g/dL   RDW 11.9 11.7 - 15.4 %   Platelets 270  150 - 450 x10E3/uL   Neutrophils 51 Not Estab. %   Lymphs 35 Not Estab. %   Monocytes 6 Not Estab. %   Eos 6 Not Estab. %   Basos 1 Not Estab. %   Neutrophils Absolute 3.3 1.4 - 7.0 x10E3/uL   Lymphocytes Absolute 2.2 0.7 - 3.1 x10E3/uL   Monocytes Absolute 0.4 0.1 - 0.9 x10E3/uL   EOS (ABSOLUTE) 0.4 0.0 - 0.4 x10E3/uL   Basophils Absolute 0.0 0.0 - 0.2 x10E3/uL   Immature Granulocytes 1 Not Estab. %   Immature Grans (Abs) 0.0 0.0 - 0.1 x10E3/uL  Comprehensive metabolic panel  Result Value Ref Range   Glucose 92 65 - 99 mg/dL   BUN 10 8 - 27 mg/dL   Creatinine, Ser 0.71 0.57 - 1.00 mg/dL   eGFR 94 >59 mL/min/1.73   BUN/Creatinine Ratio 14 12 - 28   Sodium 140 134 - 144 mmol/L   Potassium 3.8 3.5 - 5.2 mmol/L   Chloride 98 96 - 106 mmol/L   CO2 27 20 - 29 mmol/L   Calcium 9.7 8.7 - 10.3 mg/dL   Total Protein 6.9 6.0 - 8.5 g/dL   Albumin 4.5 3.8 - 4.8 g/dL   Globulin, Total 2.4 1.5 - 4.5 g/dL   Albumin/Globulin Ratio 1.9 1.2 - 2.2   Bilirubin Total 0.4 0.0 - 1.2 mg/dL   Alkaline Phosphatase 78 44 - 121 IU/L   AST 16 0 - 40 IU/L   ALT 20 0 - 32 IU/L  Lipid Panel w/o Chol/HDL Ratio  Result Value Ref Range   Cholesterol, Total 195 100 - 199 mg/dL   Triglycerides 176 (H) 0 - 149 mg/dL   HDL 66 >39 mg/dL   VLDL Cholesterol Cal 30 5 - 40 mg/dL   LDL Chol Calc (NIH) 99 0 - 99 mg/dL      Assessment & Plan:   Problem List Items Addressed This Visit      Cardiovascular and Mediastinum   Essential hypertension - Primary    Running slightly high today. Continue current regimen and watch salt intake. Call with  any concerns. Continue to monitor. Refills given.       Relevant Medications   rosuvastatin (CRESTOR) 10 MG tablet   hydrochlorothiazide (HYDRODIURIL) 50 MG tablet   Other Relevant Orders   CBC with Differential/Platelet (Completed)   Comprehensive metabolic panel (Completed)     Respiratory   Asthma    Under good control on current regimen. Continue current regimen. Continue to monitor. Call with any concerns. Refills given. Labs drawn today.        Relevant Medications   Fluticasone-Umeclidin-Vilant (TRELEGY ELLIPTA) 100-62.5-25 MCG/INH AEPB   albuterol (PROVENTIL) (2.5 MG/3ML) 0.083% nebulizer solution   albuterol (PROAIR HFA) 108 (90 Base) MCG/ACT inhaler   Other Relevant Orders   CBC with Differential/Platelet (Completed)   Comprehensive metabolic panel (Completed)     Endocrine   Diabetes mellitus associated with hormonal etiology ([place])    Doing well with A1c 5.7. Continue current regimen. Continue to monitor. Call with any concerns.       Relevant Medications   rosuvastatin (CRESTOR) 10 MG tablet   Other Relevant Orders   Bayer DCA Hb A1c Waived (Completed)   CBC with Differential/Platelet (Completed)   Comprehensive metabolic panel (Completed)   Hyperlipidemia associated with type 2 diabetes mellitus ([place])    Under good control on current regimen. Continue current regimen. Continue to monitor. Call with any concerns. Refills given. Labs drawn today.  Relevant Medications   rosuvastatin (CRESTOR) 10 MG tablet   Other Relevant Orders   CBC with Differential/Platelet (Completed)   Comprehensive metabolic panel (Completed)   Lipid Panel w/o Chol/HDL Ratio (Completed)     Other   Depression, recurrent (HCC)    Under good control on current regimen. Continue current regimen. Continue to monitor. Call with any concerns. Refills given. Labs drawn today.        Relevant Medications   buPROPion (WELLBUTRIN SR) 150 MG 12 hr tablet   LORazepam (ATIVAN) 1 MG  tablet   Other Relevant Orders   CBC with Differential/Platelet (Completed)   Comprehensive metabolic panel (Completed)   Mixed stress and urge urinary incontinence    Under good control on current regimen. Continue current regimen. Continue to monitor. Call with any concerns. Refills given. Labs drawn today.        Relevant Medications   MYRBETRIQ 50 MG TB24 tablet   Vitamin D deficiency    Rechecking labs today. Await results. Treat as needed.       Relevant Orders   CBC with Differential/Platelet (Completed)   Comprehensive metabolic panel (Completed)    Other Visit Diagnoses    Peripheral edema       Concern about color change. Will get her into vascular. Call with any concerns. Good pulses today.   Relevant Orders   Ambulatory referral to Vascular Surgery   Recurrent major depressive disorder, in full remission ([place])       Relevant Medications   buPROPion (WELLBUTRIN SR) 150 MG 12 hr tablet   LORazepam (ATIVAN) 1 MG tablet       Follow up plan: Return in about 6 months (around 03/01/2021).

## 2020-08-30 LAB — LIPID PANEL W/O CHOL/HDL RATIO
Cholesterol, Total: 195 mg/dL (ref 100–199)
HDL: 66 mg/dL (ref >39)
LDL Chol Calc (NIH): 99 mg/dL (ref 0–99)
Triglycerides: 176 mg/dL — ABNORMAL HIGH (ref 0–149)
VLDL Cholesterol Cal: 30 mg/dL (ref 5–40)

## 2020-08-30 LAB — COMPREHENSIVE METABOLIC PANEL
ALT: 20 IU/L (ref 0–32)
AST: 16 IU/L (ref 0–40)
Albumin/Globulin Ratio: 1.9 (ref 1.2–2.2)
Albumin: 4.5 g/dL (ref 3.8–4.8)
Alkaline Phosphatase: 78 IU/L (ref 44–121)
BUN/Creatinine Ratio: 14 (ref 12–28)
BUN: 10 mg/dL (ref 8–27)
Bilirubin Total: 0.4 mg/dL (ref 0.0–1.2)
CO2: 27 mmol/L (ref 20–29)
Calcium: 9.7 mg/dL (ref 8.7–10.3)
Chloride: 98 mmol/L (ref 96–106)
Creatinine, Ser: 0.71 mg/dL (ref 0.57–1.00)
Globulin, Total: 2.4 g/dL (ref 1.5–4.5)
Glucose: 92 mg/dL (ref 65–99)
Potassium: 3.8 mmol/L (ref 3.5–5.2)
Sodium: 140 mmol/L (ref 134–144)
Total Protein: 6.9 g/dL (ref 6.0–8.5)
eGFR: 94 mL/min/{1.73_m2} (ref >59)

## 2020-08-30 LAB — CBC WITH DIFFERENTIAL/PLATELET
Basophils Absolute: 0 10*3/uL (ref 0.0–0.2)
Basos: 1 %
EOS (ABSOLUTE): 0.4 10*3/uL (ref 0.0–0.4)
Eos: 6 %
Hematocrit: 41.1 % (ref 34.0–46.6)
Hemoglobin: 14.1 g/dL (ref 11.1–15.9)
Immature Grans (Abs): 0 10*3/uL (ref 0.0–0.1)
Immature Granulocytes: 1 %
Lymphocytes Absolute: 2.2 10*3/uL (ref 0.7–3.1)
Lymphs: 35 %
MCH: 31.3 pg (ref 26.6–33.0)
MCHC: 34.3 g/dL (ref 31.5–35.7)
MCV: 91 fL (ref 79–97)
Monocytes Absolute: 0.4 10*3/uL (ref 0.1–0.9)
Monocytes: 6 %
Neutrophils Absolute: 3.3 10*3/uL (ref 1.4–7.0)
Neutrophils: 51 %
Platelets: 270 10*3/uL (ref 150–450)
RBC: 4.51 x10E6/uL (ref 3.77–5.28)
RDW: 11.9 % (ref 11.7–15.4)
WBC: 6.2 10*3/uL (ref 3.4–10.8)

## 2020-09-03 ENCOUNTER — Encounter: Payer: Self-pay | Admitting: Family Medicine

## 2020-09-03 NOTE — Assessment & Plan Note (Signed)
Rechecking labs today. Await results. Treat as needed.  °

## 2020-09-03 NOTE — Assessment & Plan Note (Signed)
Doing well with A1c 5.7. Continue current regimen. Continue to monitor. Call with any concerns.

## 2020-09-03 NOTE — Assessment & Plan Note (Signed)
Under good control on current regimen. Continue current regimen. Continue to monitor. Call with any concerns. Refills given. Labs drawn today.   

## 2020-09-03 NOTE — Assessment & Plan Note (Signed)
Running slightly high today. Continue current regimen and watch salt intake. Call with any concerns. Continue to monitor. Refills given.

## 2020-09-04 ENCOUNTER — Encounter: Payer: Self-pay | Admitting: Family Medicine

## 2020-09-11 ENCOUNTER — Telehealth: Payer: Self-pay

## 2020-09-11 ENCOUNTER — Telehealth (INDEPENDENT_AMBULATORY_CARE_PROVIDER_SITE_OTHER): Payer: Self-pay

## 2020-09-11 NOTE — Telephone Encounter (Signed)
Documentation only.

## 2020-09-11 NOTE — Telephone Encounter (Signed)
PA initiated for Myrbetriq 50 MG tab via CoverMyMeds. Waiting on determination. KEY: [id number]

## 2020-09-14 ENCOUNTER — Telehealth: Payer: Self-pay

## 2020-09-14 ENCOUNTER — Encounter (INDEPENDENT_AMBULATORY_CARE_PROVIDER_SITE_OTHER): Payer: Self-pay | Admitting: Vascular Surgery

## 2020-09-14 ENCOUNTER — Other Ambulatory Visit: Payer: Self-pay

## 2020-09-14 ENCOUNTER — Ambulatory Visit (INDEPENDENT_AMBULATORY_CARE_PROVIDER_SITE_OTHER): Payer: 59 | Admitting: Vascular Surgery

## 2020-09-14 VITALS — BP 148/83 | HR 68 | Resp 16 | Ht 60.0 in | Wt 190.6 lb

## 2020-09-14 DIAGNOSIS — J4530 Mild persistent asthma, uncomplicated: Secondary | ICD-10-CM

## 2020-09-14 DIAGNOSIS — I10 Essential (primary) hypertension: Secondary | ICD-10-CM | POA: Diagnosis not present

## 2020-09-14 DIAGNOSIS — I7300 Raynaud's syndrome without gangrene: Secondary | ICD-10-CM

## 2020-09-14 DIAGNOSIS — M7989 Other specified soft tissue disorders: Secondary | ICD-10-CM

## 2020-09-14 DIAGNOSIS — E782 Mixed hyperlipidemia: Secondary | ICD-10-CM

## 2020-09-14 DIAGNOSIS — M79669 Pain in unspecified lower leg: Secondary | ICD-10-CM

## 2020-09-14 DIAGNOSIS — E1169 Type 2 diabetes mellitus with other specified complication: Secondary | ICD-10-CM

## 2020-09-14 NOTE — Telephone Encounter (Signed)
PA for Myrbetriq 50MG  er Tablets denied.

## 2020-09-18 ENCOUNTER — Encounter (INDEPENDENT_AMBULATORY_CARE_PROVIDER_SITE_OTHER): Payer: Self-pay | Admitting: Vascular Surgery

## 2020-09-18 DIAGNOSIS — M7989 Other specified soft tissue disorders: Secondary | ICD-10-CM | POA: Insufficient documentation

## 2020-09-18 DIAGNOSIS — M79669 Pain in unspecified lower leg: Secondary | ICD-10-CM | POA: Insufficient documentation

## 2020-09-18 DIAGNOSIS — E785 Hyperlipidemia, unspecified: Secondary | ICD-10-CM | POA: Insufficient documentation

## 2020-09-18 DIAGNOSIS — I7300 Raynaud's syndrome without gangrene: Secondary | ICD-10-CM | POA: Insufficient documentation

## 2020-09-18 NOTE — Progress Notes (Signed)
MRN : [id number]  [name] is a 65 y.o. (June 19, 1955) female who presents with chief complaint of  Chief Complaint  Patient presents with  . New Patient (Initial Visit)    Ref [name] peripheral edema consult  .  History of Present Illness:  Patient is seen for evaluation of leg swelling. The patient first noticed the swelling remotely but is now concerned because of a significant increase in the overall edema. The swelling is associated with pain and discoloration. The patient notes that in the morning the legs are significantly improved but they steadily worsened throughout the course of the day. Elevation makes the legs better, dependency makes them much worse.   There is no history of ulcerations associated with the swelling.   The patient is also noting her toes are associated with Raynaud's changes. The patient notes her toes turned blue and become uncomfortable. Exposure to cold environments makes the symptoms much worse. The patient has not been taking Norvasc  There is no history of malignancy or autoimmune disease.  The patient denies any recent changes in their medications.  The patient has not been wearing graduated compression.  The patient has no had any past angiography, interventions or vascular surgery.  The patient denies a history of DVT or PE. There is no prior history of phlebitis. There is no history of primary lymphedema.  There is no history of radiation treatment to the groin or pelvis No history of malignancies. No history of trauma or groin or pelvic surgery. No history of foreign travel or parasitic infections area     Current Meds  Medication Sig  . albuterol (PROAIR HFA) 108 (90 Base) MCG/ACT inhaler Inhale 2 puffs into the lungs every 6 (six) hours as needed for wheezing or shortness of breath.  Marland Kitchen albuterol (PROVENTIL) (2.5 MG/3ML) 0.083% nebulizer solution USE 1 VIAL VIA NEBULIZER 1 TO 2 TIMES A DAY AS DIRECTED  . benzonatate  (TESSALON) 200 MG capsule Take 1 capsule (200 mg total) by mouth 2 (two) times daily as needed for cough.  Marland Kitchen buPROPion (WELLBUTRIN SR) 150 MG 12 hr tablet Take 1 tablet (150 mg total) by mouth 3 (three) times daily.  . diphenoxylate-atropine (LOMOTIL) 2.5-0.025 MG tablet diphenoxylate-atropine 2.5 mg-0.025 mg tablet  . fluticasone (FLONASE) 50 MCG/ACT nasal spray Place 2 sprays into both nostrils 2 (two) times daily.  . Fluticasone-Umeclidin-Vilant (TRELEGY ELLIPTA) 100-62.5-25 MCG/INH AEPB Inhale 1 puff into the lungs daily.  Marland Kitchen guaiFENesin (MUCINEX) 600 MG 12 hr tablet Take 1 tablet (600 mg total) by mouth 2 (two) times daily as needed for cough or to loosen phlegm.  . hydrochlorothiazide (HYDRODIURIL) 50 MG tablet Take 1 tablet (50 mg total) by mouth daily.  . hyoscyamine (LEVSIN SL) 0.125 MG SL tablet TAKE ONE TABLET BY MOUTH EVERY 4 HOURS AS NEEDED  . LORazepam (ATIVAN) 1 MG tablet TAKE 1/2 TO 1 TABLET AS NEEDED FOR ANXIETY  . meloxicam (MOBIC) 15 MG tablet Take 1 tablet (15 mg total) by mouth daily.  . methocarbamol (ROBAXIN) 500 MG tablet methocarbamol 500 mg tablet  TAKE 1 TABLET BY MOUTH EVERY 6 HOURS AS NEEDED FOR MUSCLE SPASMS  . MYRBETRIQ 50 MG TB24 tablet Take 1 tablet (50 mg total) by mouth daily.  Marland Kitchen omeprazole (PRILOSEC) 40 MG capsule Take 1 capsule (40 mg total) by mouth daily.  . ondansetron (ZOFRAN) 8 MG tablet Take 1 tablet (8 mg total) by mouth every 8 (eight) hours as needed for nausea or vomiting.  Marland Kitchen  rosuvastatin (CRESTOR) 10 MG tablet Take 0.5 tablets (5 mg total) by mouth once a week.  . TRULICITY 3 GU/4.4IH SOPN INJECT 3MG AS DIRECTED ONCE A WEEK  . Vitamin D, Ergocalciferol, (DRISDOL) 1.25 MG (50000 UNIT) CAPS capsule TAKE 1 CAPSULE EVERY 7 DAYS    Past Medical History:  Diagnosis Date  . Asthma   . Depression   . Diabetes mellitus without complication ([place])   . Hyperlipidemia   . Kidney stones   . Migraine headache     Past Surgical History:  Procedure  Laterality Date  . ABDOMINAL HYSTERECTOMY    . BREAST CYST ASPIRATION Right 1981   benign  . BREAST CYST ASPIRATION Right 1984   benign  . CHOLECYSTECTOMY    . COLONOSCOPY  March 2016  . FLEXIBLE BRONCHOSCOPY N/A 01/26/2018   Procedure: FLEXIBLE BRONCHOSCOPY;  Surgeon: Laverle Hobby, MD;  Location: [facility] ORS;  Service: Pulmonary;  Laterality: N/A;  . MENISCUS REPAIR Left 09/18/2018  . OOPHORECTOMY    . PARTIAL KNEE ARTHROPLASTY Left 02/2019    Social History Social History   Tobacco Use  . Smoking status: Never Smoker  . Smokeless tobacco: Never Used  Vaping Use  . Vaping Use: Never used  Substance Use Topics  . Alcohol use: Yes    Comment: social  . Drug use: No    Family History Family History  Problem Relation Age of Onset  . Hypertension Mother   . Cancer Mother        breast  . Arthritis Mother   . Alzheimer's disease Mother   . Parkinson's disease Mother   . Breast cancer Mother 78  . Hypertension Father   . Diabetes Father   . Kidney cancer Father   . Breast cancer Cousin 67  . Breast cancer Maternal Aunt   . Breast cancer Maternal Grandmother 74  No family history of bleeding/clotting disorders, porphyria or autoimmune disease   Allergies  Allergen Reactions  . Cymbalta [Duloxetine Hcl] Other (See Comments)    Hallucinations  . Amoxicillin Hives  . Atorvastatin Other (See Comments)    myalgias     REVIEW OF SYSTEMS (Negative unless checked)  Constitutional: _0 Weight loss  _1 Fever  _2 Chills Cardiac: _3 Chest pain   _4 Chest pressure   _5 Palpitations   _6 Shortness of breath when laying flat   _7 Shortness of breath with exertion. Vascular:  _8 Pain in legs with walking   _9 Pain in legs at rest  _10 History of DVT   _11 Phlebitis   _12 Swelling in legs   _13 Varicose veins   _14 Non-healing ulcers Pulmonary:   _15 Uses home oxygen   _16 Productive cough   _17 Hemoptysis   _18 Wheeze  _19 COPD   _20 Asthma Neurologic:  _21 Dizziness   _22 Seizures   _23 History of stroke    _24 History of TIA  _25 Aphasia   _26 Vissual changes   _27 Weakness or numbness in arm   _28 Weakness or numbness in leg Musculoskeletal:   _29 Joint swelling   _30 Joint pain   _31 Low back pain Hematologic:  _32 Easy bruising  _33 Easy bleeding   _34 Hypercoagulable state   _35 Anemic Gastrointestinal:  _36 Diarrhea   _37 Vomiting  _38 Gastroesophageal reflux/heartburn   _39 Difficulty swallowing. Genitourinary:  _40 Chronic kidney disease   _41 Difficult urination  _42 Frequent urination   _43 Blood in urine Skin:  _44 Rashes   _45 Ulcers  Psychological:  _46 History of anxiety   _47  History of major depression.  Physical Examination  Vitals:   09/14/20 1504  BP: (!) 148/83  Pulse: 68  Resp: 16  Weight: 190 lb 9.6 oz (86.5 kg)  Height: 5' (1.524 m)   Body mass index is 37.22 kg/m. Gen: WD/WN, NAD Head: [place]/AT, No temporalis wasting.  Ear/Nose/Throat: Hearing grossly intact, nares w/o erythema or drainage, poor dentition Eyes: PER, EOMI, sclera nonicteric.  Neck: Supple, no masses.  No bruit or JVD.  Pulmonary:  Good air movement, clear to auscultation bilaterally, no use of accessory muscles.  Cardiac: RRR, normal S1, S2, no Murmurs. Vascular: 2-3+ edema bilateral feet and ankles more pronounced on the dorsum of the feet. Cyanosis of all toes bilaterally Vessel Right Left  Radial Palpable Palpable  PT Palpable Palpable  DP Palpable Palpable  Gastrointestinal: soft, non-distended. No guarding/no peritoneal signs.  Musculoskeletal: M/S 5/5 throughout.  No deformity or atrophy.  Neurologic: CN 2-12 intact. Pain and light touch intact in extremities.  Symmetrical.  Speech is fluent. Motor exam as listed above. Psychiatric: Judgment intact, Mood & affect appropriate for pt's clinical situation. Dermatologic: No rashes or ulcers noted.  No changes consistent with cellulitis. Lymph : No lichenification or skin changes of chronic lymphedema.  CBC Lab Results  Component Value Date   WBC 6.2 08/29/2020   HGB 14.1 08/29/2020    HCT 41.1 08/29/2020   MCV 91 08/29/2020   PLT 270 08/29/2020    BMET    Component Value Date/Time   NA 140 08/29/2020 1431   NA 138 08/30/2013 0405   K 3.8 08/29/2020 1431   K 3.6 08/30/2013 0405   CL 98 08/29/2020 1431   CL 103 08/30/2013 0405   CO2 27 08/29/2020 1431   CO2 32 08/30/2013 0405   GLUCOSE 92 08/29/2020 1431   GLUCOSE 139 (H) 01/18/2018 2216   GLUCOSE 113 (H) 08/30/2013 0405   BUN 10 08/29/2020 1431   BUN 12 08/30/2013 0405   CREATININE 0.71 08/29/2020 1431   CREATININE 0.64 08/30/2013 0405   CALCIUM 9.7 08/29/2020 1431   CALCIUM 9.1 08/30/2013 0405   GFRNONAA 84 02/23/2020 0824   GFRNONAA >60 08/30/2013 0405   GFRAA 97 02/23/2020 0824   GFRAA >60 08/30/2013 0405   Estimated Creatinine Clearance: 68.5 mL/min (by C-G formula based on SCr of 0.71 mg/dL).  COAG No results found for: INR, PROTIME  Radiology No results found.   Assessment/Plan 1. Pain and swelling of lower leg, unspecified laterality I have had a long discussion with the patient regarding swelling and why it  causes symptoms.  Patient will begin wearing graduated compression stockings class 1 (20-30 mmHg) on a daily basis a prescription was given. The patient will  beginning wearing the stockings first thing in the morning and removing them in the evening. The patient is instructed specifically not to sleep in the stockings.   In addition, behavioral modification will be initiated.  This will include frequent elevation, use of over the counter pain medications and exercise such as walking.  I have reviewed systemic causes for chronic edema such as liver, kidney and cardiac etiologies.  The patient denies problems with these organ systems.    Patient should undergo duplex ultrasound of the venous system to ensure that DVT or reflux is not present.  The patient will follow-up with me after the ultrasound.   - VAS [place] ABI WITH/WO TBI; Future - VAS [place] LOWER EXTREMITY VENOUS REFLUX;  Future  2. Raynaud's phenomenon without gangrene Recommend:  The patient is currently tolerating the Raynaud's changes fairly well. Lengthy discussion regarding keeping the feet and the hands warm; gloves, washing with only warm water (especially avoiding cold water immersion) and  using wool socks, specifically Smartwool was recommended.  Possibility of using Norvasc was discussed but it was decided to hold off for now until the benefits of conservative therapy is assessed.  The use of Pletal was also reviewed as well as the fact that it is an off label use which is typically reserved for failures of Norvasc and conservative therapy.  Also it is found to be useful in patients that have ulcerated.  - VAS [place] ABI WITH/WO TBI; Future  3. Essential hypertension Continue antihypertensive medications as already ordered, these medications have been reviewed and there are no changes at this time.   4. Mild persistent asthma without complication Continue pulmonary medications and aerosols as already ordered, these medications have been reviewed and there are no changes at this time.    5. Diabetes mellitus associated with hormonal etiology ([place]) Continue hypoglycemic medications as already ordered, these medications have been reviewed and there are no changes at this time.  Hgb A1C to be monitored as already arranged by primary service   6. Mixed hyperlipidemia Continue statin as ordered and reviewed, no changes at this time     Hortencia Pilar, MD  09/18/2020 12:28 PM

## 2020-09-20 ENCOUNTER — Telehealth: Payer: Self-pay

## 2020-09-20 NOTE — Telephone Encounter (Signed)
error 

## 2020-09-21 ENCOUNTER — Telehealth: Payer: Self-pay

## 2020-09-21 NOTE — Telephone Encounter (Signed)
PA for Myrbetriq 50MG  ER tablets denied.

## 2020-09-29 NOTE — Telephone Encounter (Signed)
Nothing is scanned- can we check to see what is covered? I'm happy to send something else in.

## 2020-10-04 MED ORDER — OXYBUTYNIN CHLORIDE ER 10 MG PO TB24: 10.0000 mg | ORAL_TABLET | Freq: Every day | ORAL | 1 refills | Status: DC

## 2020-10-05 ENCOUNTER — Encounter (INDEPENDENT_AMBULATORY_CARE_PROVIDER_SITE_OTHER): Payer: Self-pay | Admitting: Vascular Surgery

## 2020-10-05 ENCOUNTER — Ambulatory Visit (INDEPENDENT_AMBULATORY_CARE_PROVIDER_SITE_OTHER): Payer: 59 | Admitting: Vascular Surgery

## 2020-10-05 ENCOUNTER — Other Ambulatory Visit: Payer: Self-pay

## 2020-10-05 ENCOUNTER — Ambulatory Visit (INDEPENDENT_AMBULATORY_CARE_PROVIDER_SITE_OTHER): Payer: 59

## 2020-10-05 VITALS — BP 111/76 | HR 60 | Ht 60.0 in | Wt 196.0 lb

## 2020-10-05 DIAGNOSIS — I7300 Raynaud's syndrome without gangrene: Secondary | ICD-10-CM

## 2020-10-05 DIAGNOSIS — M79669 Pain in unspecified lower leg: Secondary | ICD-10-CM | POA: Diagnosis not present

## 2020-10-05 DIAGNOSIS — I872 Venous insufficiency (chronic) (peripheral): Secondary | ICD-10-CM

## 2020-10-05 DIAGNOSIS — J4530 Mild persistent asthma, uncomplicated: Secondary | ICD-10-CM

## 2020-10-05 DIAGNOSIS — I10 Essential (primary) hypertension: Secondary | ICD-10-CM | POA: Diagnosis not present

## 2020-10-05 DIAGNOSIS — M7989 Other specified soft tissue disorders: Secondary | ICD-10-CM

## 2020-10-05 DIAGNOSIS — E1169 Type 2 diabetes mellitus with other specified complication: Secondary | ICD-10-CM

## 2020-10-05 NOTE — Progress Notes (Signed)
MRN : [id number]  Auden Wettstein Gaetano is a 65 y.o. (15-Sep-1955) female who presents with chief complaint of  Chief Complaint  Patient presents with   Follow-up    Pt conv . [place]. [facility]   .  History of Present Illness:  The patient returns to the office for followup and review of the noninvasive studies. There have been no interval changes in lower extremity symptoms.  She continues to have bluish discoloration of her feet and toes as well as some discomfort.  No new ulcers or wounds have occurred since the last visit.  She does have a history of significant DJD and is status post left total knee replacement.  There have been no significant changes to the patient's overall health care.  The patient denies amaurosis fugax or recent TIA symptoms. There are no recent neurological changes noted. The patient denies history of DVT, PE or superficial thrombophlebitis. The patient denies recent episodes of angina or shortness of breath.   ABI Rt=1.15 and Lt=1.17 triphasic signals bilaterally     Current Meds  Medication Sig   albuterol (PROAIR HFA) 108 (90 Base) MCG/ACT inhaler Inhale 2 puffs into the lungs every 6 (six) hours as needed for wheezing or shortness of breath.   albuterol (PROVENTIL) (2.5 MG/3ML) 0.083% nebulizer solution USE 1 VIAL VIA NEBULIZER 1 TO 2 TIMES A DAY AS DIRECTED   benzonatate (TESSALON) 200 MG capsule Take 1 capsule (200 mg total) by mouth 2 (two) times daily as needed for cough.   buPROPion (WELLBUTRIN SR) 150 MG 12 hr tablet Take 1 tablet (150 mg total) by mouth 3 (three) times daily.   diphenoxylate-atropine (LOMOTIL) 2.5-0.025 MG tablet diphenoxylate-atropine 2.5 mg-0.025 mg tablet   fluticasone (FLONASE) 50 MCG/ACT nasal spray Place 2 sprays into both nostrils 2 (two) times daily.   Fluticasone-Umeclidin-Vilant (TRELEGY ELLIPTA) 100-62.5-25 MCG/INH AEPB Inhale 1 puff into the lungs daily.   guaiFENesin (MUCINEX) 600 MG 12 hr tablet Take 1 tablet (600 mg  total) by mouth 2 (two) times daily as needed for cough or to loosen phlegm.   hydrochlorothiazide (HYDRODIURIL) 50 MG tablet Take 1 tablet (50 mg total) by mouth daily.   hyoscyamine (LEVSIN SL) 0.125 MG SL tablet TAKE ONE TABLET BY MOUTH EVERY 4 HOURS AS NEEDED   LORazepam (ATIVAN) 1 MG tablet TAKE 1/2 TO 1 TABLET AS NEEDED FOR ANXIETY   meloxicam (MOBIC) 15 MG tablet Take 1 tablet (15 mg total) by mouth daily.   methocarbamol (ROBAXIN) 500 MG tablet methocarbamol 500 mg tablet  TAKE 1 TABLET BY MOUTH EVERY 6 HOURS AS NEEDED FOR MUSCLE SPASMS   omeprazole (PRILOSEC) 40 MG capsule Take 1 capsule (40 mg total) by mouth daily.   ondansetron (ZOFRAN) 8 MG tablet Take 1 tablet (8 mg total) by mouth every 8 (eight) hours as needed for nausea or vomiting.   oxybutynin (DITROPAN XL) 10 MG 24 hr tablet Take 1 tablet (10 mg total) by mouth at bedtime.   rosuvastatin (CRESTOR) 10 MG tablet Take 0.5 tablets (5 mg total) by mouth once a week.   TRULICITY 3 AY/0.4HT SOPN INJECT 3MG AS DIRECTED ONCE A WEEK   Vitamin D, Ergocalciferol, (DRISDOL) 1.25 MG (50000 UNIT) CAPS capsule TAKE 1 CAPSULE EVERY 7 DAYS    Past Medical History:  Diagnosis Date   Asthma    Depression    Diabetes mellitus without complication (HCC)    Hyperlipidemia    Kidney stones    Migraine headache  Past Surgical History:  Procedure Laterality Date   ABDOMINAL HYSTERECTOMY     BREAST CYST ASPIRATION Right 1981   benign   BREAST CYST ASPIRATION Right 1984   benign   CHOLECYSTECTOMY     COLONOSCOPY  March 2016   FLEXIBLE BRONCHOSCOPY N/A 01/26/2018   Procedure: FLEXIBLE BRONCHOSCOPY;  Surgeon: Laverle Hobby, MD;  Location: [facility] ORS;  Service: Pulmonary;  Laterality: N/A;   MENISCUS REPAIR Left 09/18/2018   OOPHORECTOMY     PARTIAL KNEE ARTHROPLASTY Left 02/2019    Social History Social History   Tobacco Use   Smoking status: Never   Smokeless tobacco: Never  Vaping Use   Vaping Use: Never used   Substance Use Topics   Alcohol use: Yes    Comment: social   Drug use: No    Family History Family History  Problem Relation Age of Onset   Hypertension Mother    Cancer Mother        breast   Arthritis Mother    Alzheimer's disease Mother    Parkinson's disease Mother    Breast cancer Mother 29   Hypertension Father    Diabetes Father    Kidney cancer Father    Breast cancer Cousin 35   Breast cancer Maternal Aunt    Breast cancer Maternal Grandmother 84    Allergies  Allergen Reactions   Cymbalta [Duloxetine Hcl] Other (See Comments)    Hallucinations   Amoxicillin Hives   Atorvastatin Other (See Comments)    myalgias     REVIEW OF SYSTEMS (Negative unless checked)  Constitutional: _0 Weight loss  _1 Fever  _2 Chills Cardiac: _3 Chest pain   _4 Chest pressure   _5 Palpitations   _6 Shortness of breath when laying flat   _7 Shortness of breath with exertion. Vascular:  _8 Pain in legs with walking   _9 Pain in legs at rest  _10 History of DVT   _11 Phlebitis   _12 Swelling in legs   _13 Varicose veins   _14 Non-healing ulcers Pulmonary:   _15 Uses home oxygen   _16 Productive cough   _17 Hemoptysis   _18 Wheeze  _19 COPD   _20 Asthma Neurologic:  _21 Dizziness   _22 Seizures   _23 History of stroke   _24 History of TIA  _25 Aphasia   _26 Vissual changes   _27 Weakness or numbness in arm   _28 Weakness or numbness in leg Musculoskeletal:   _29 Joint swelling   _30 Joint pain   _31 Low back pain Hematologic:  _32 Easy bruising  _33 Easy bleeding   _34 Hypercoagulable state   _35 Anemic Gastrointestinal:  _36 Diarrhea   _37 Vomiting  _38 Gastroesophageal reflux/heartburn   _39 Difficulty swallowing. Genitourinary:  _40 Chronic kidney disease   _41 Difficult urination  _42 Frequent urination   _43 Blood in urine Skin:  _44 Rashes   _45 Ulcers  Psychological:  _46 History of anxiety   _47  History of major depression.  Physical Examination  Vitals:   10/05/20 0839  BP: 111/76  Pulse: 60  Weight: 196 lb (88.9 kg)  Height: 5' (1.524 m)   Body  mass index is 38.28 kg/m. Gen: WD/WN, NAD Head: [place]/AT, No temporalis wasting.  Ear/Nose/Throat: Hearing grossly intact, nares w/o erythema or drainage, poor dentition Eyes: PER, EOMI, sclera nonicteric.  Neck: Supple, no masses.  No bruit or JVD.  Pulmonary:  Good air movement, clear to auscultation bilaterally, no use of accessory muscles.  Cardiac: RRR, normal S1, S2, no Murmurs. Vascular: scattered varicosities present bilaterally.  Mild venous stasis changes to the legs bilaterally.  trace soft pitting edema  Vessel Right Left  Radial Palpable Palpable  Gastrointestinal: soft, non-distended. No guarding/no peritoneal signs.  Musculoskeletal: M/S 5/5 throughout.  No deformity or atrophy.  Neurologic: CN 2-12 intact. Pain and light touch intact in extremities.  Symmetrical.  Speech is fluent. Motor exam as listed above. Psychiatric: Judgment intact, Mood & affect appropriate for pt's clinical situation. Dermatologic: Mild venous rashes no ulcers noted.  No changes consistent with cellulitis. Lymph : No lichenification or skin changes of chronic lymphedema.  CBC Lab Results  Component Value Date   WBC 6.2 08/29/2020   HGB 14.1 08/29/2020   HCT 41.1 08/29/2020   MCV 91 08/29/2020   PLT 270 08/29/2020    BMET    Component Value Date/Time   NA 140 08/29/2020 1431   NA 138 08/30/2013 0405   K 3.8 08/29/2020 1431   K 3.6 08/30/2013 0405   CL 98 08/29/2020 1431   CL 103 08/30/2013 0405   CO2 27 08/29/2020 1431   CO2 32 08/30/2013 0405   GLUCOSE 92 08/29/2020 1431   GLUCOSE 139 (H) 01/18/2018 2216   GLUCOSE 113 (H) 08/30/2013 0405   BUN 10 08/29/2020 1431   BUN 12 08/30/2013 0405   CREATININE 0.71 08/29/2020 1431   CREATININE 0.64 08/30/2013 0405   CALCIUM 9.7 08/29/2020 1431   CALCIUM 9.1 08/30/2013 0405   GFRNONAA 84 02/23/2020 0824   GFRNONAA >60 08/30/2013 0405   GFRAA 97 02/23/2020 0824   GFRAA >60 08/30/2013 0405   CrCl cannot be calculated (Patient's most recent  lab result is older than the maximum 21 days allowed.).  COAG No results found for: INR, PROTIME  Radiology No results found.   Assessment/Plan 1. Chronic venous insufficiency No surgery or intervention at this point in time.  I have reviewed my discussion with the patient regarding venous insufficiency and why it causes symptoms. I have discussed with the patient the chronic skin changes that accompany venous insufficiency and the long term sequela such as ulceration. Patient will contnue wearing graduated compression stockings on a daily basis, as this has provided excellent control of his edema. The patient will put the stockings on first thing in the morning and removing them in the evening. The patient is reminded not to sleep in the stockings.  In addition, behavioral modification including elevation during the day will be initiated. Exercise is strongly encouraged.  ABI's are normal  Given the patient's good control and lack of any problems regarding the venous insufficiency a lymph pump in not need at this time.  The patient will follow up with me PRN should anything change.  The patient voices agreement with this plan.   2. Essential hypertension Continue antihypertensive medications as already ordered, these medications have been reviewed and there are no changes at this time.   3. Mild persistent asthma without complication Continue pulmonary medications and aerosols as already ordered, these medications have been reviewed and there are no changes at this time.    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  Hgb A1C to be monitored as already arranged by primary service     Hortencia Pilar, MD  10/05/2020 8:51 AM

## 2020-11-21 ENCOUNTER — Other Ambulatory Visit: Payer: Self-pay | Admitting: Family Medicine

## 2020-11-21 ENCOUNTER — Other Ambulatory Visit: Payer: Self-pay | Admitting: Internal Medicine

## 2020-11-21 DIAGNOSIS — Z1231 Encounter for screening mammogram for malignant neoplasm of breast: Secondary | ICD-10-CM

## 2020-11-30 ENCOUNTER — Ambulatory Visit
Admission: RE | Admit: 2020-11-30 | Discharge: 2020-11-30 | Disposition: A | Discharge status: Home / Self Care | Payer: 59 | Source: Ambulatory Visit | Attending: Internal Medicine | Admitting: Internal Medicine

## 2020-11-30 ENCOUNTER — Ambulatory Visit
Admission: RE | Admit: 2020-11-30 | Discharge: 2020-11-30 | Disposition: A | Discharge status: Home / Self Care | Payer: 59 | Source: Home / Self Care | Attending: Internal Medicine | Admitting: Internal Medicine

## 2020-11-30 ENCOUNTER — Ambulatory Visit: Payer: 59 | Admitting: Internal Medicine

## 2020-11-30 ENCOUNTER — Encounter: Payer: Self-pay | Admitting: Internal Medicine

## 2020-11-30 ENCOUNTER — Other Ambulatory Visit: Payer: Self-pay

## 2020-11-30 VITALS — BP 125/86 | HR 60 | Temp 98.1°F | Ht 59.84 in | Wt 196.8 lb

## 2020-11-30 DIAGNOSIS — R0602 Shortness of breath: Secondary | ICD-10-CM | POA: Insufficient documentation

## 2020-11-30 DIAGNOSIS — F3342 Major depressive disorder, recurrent, in full remission: Secondary | ICD-10-CM

## 2020-11-30 DIAGNOSIS — N39498 Other specified urinary incontinence: Secondary | ICD-10-CM | POA: Diagnosis not present

## 2020-11-30 DIAGNOSIS — R32 Unspecified urinary incontinence: Secondary | ICD-10-CM | POA: Insufficient documentation

## 2020-11-30 MED ORDER — FLUTICASONE PROPIONATE 50 MCG/ACT NA SUSP: 2.0000 | Freq: Two times a day (BID) | NASAL | 4 refills | Status: DC

## 2020-11-30 MED ORDER — METHYLPREDNISOLONE 4 MG PO TBPK: ORAL_TABLET | ORAL | 1 refills | Status: DC

## 2020-11-30 MED ORDER — BUPROPION HCL ER (SR) 150 MG PO TB12
150.0000 mg | ORAL_TABLET | Freq: Three times a day (TID) | ORAL | 1 refills | Status: DC
Start: 2020-11-30 — End: 2021-09-11

## 2020-11-30 MED ORDER — ONDANSETRON HCL 8 MG PO TABS: 8.0000 mg | ORAL_TABLET | Freq: Three times a day (TID) | ORAL | 0 refills | Status: DC | PRN

## 2020-11-30 MED ORDER — ALBUTEROL SULFATE HFA 108 (90 BASE) MCG/ACT IN AERS: 2.0000 | INHALATION_SPRAY | Freq: Four times a day (QID) | RESPIRATORY_TRACT | 2 refills | Status: DC | PRN

## 2020-11-30 MED ORDER — ROSUVASTATIN CALCIUM 10 MG PO TABS: 10.0000 mg | ORAL_TABLET | ORAL | 3 refills | Status: DC

## 2020-11-30 MED ORDER — SEMAGLUTIDE (2 MG/DOSE) 8 MG/3ML ~~LOC~~ SOPN: 2.0000 mg | PEN_INJECTOR | SUBCUTANEOUS | 5 refills | Status: DC

## 2020-11-30 MED ORDER — HYOSCYAMINE SULFATE 0.125 MG SL SUBL
SUBLINGUAL_TABLET | SUBLINGUAL | 1 refills | Status: DC
Start: 2020-11-30 — End: 2021-10-05

## 2020-11-30 MED ORDER — TRELEGY ELLIPTA 100-62.5-25 MCG/INH IN AEPB
1.0000 | INHALATION_SPRAY | Freq: Every day | RESPIRATORY_TRACT | 4 refills | Status: DC
Start: 2020-11-30 — End: 2021-10-05

## 2020-11-30 MED ORDER — METHOCARBAMOL 500 MG PO TABS: ORAL_TABLET | ORAL | 1 refills | Status: DC

## 2020-11-30 MED ORDER — ALBUTEROL SULFATE (2.5 MG/3ML) 0.083% IN NEBU
INHALATION_SOLUTION | RESPIRATORY_TRACT | 1 refills | Status: DC
Start: 2020-11-30 — End: 2022-10-17

## 2020-11-30 MED ORDER — OMEPRAZOLE 40 MG PO CPDR: 40.0000 mg | DELAYED_RELEASE_CAPSULE | Freq: Every day | ORAL | 1 refills | Status: DC

## 2020-11-30 NOTE — Progress Notes (Signed)
Please let pt know this showed Bronchitis, meds sent in should help!

## 2020-11-30 NOTE — Addendum Note (Signed)
Addended by: Loura Pardon on: 11/30/2020 12:44 PM   Modules accepted: Level of Service

## 2020-11-30 NOTE — Progress Notes (Signed)
BP 125/86   Pulse 60   Temp 98.1 F (36.7 C) (Oral)   Ht 4' 11.84" (1.52 m)   Wt 196 lb 12.8 oz (89.3 kg)   SpO2 98%   BMI 38.64 kg/m    Subjective:    Patient ID: [name], female    DOB: April 18, 1956, 65 y.o.   MRN: [id number]  Chief Complaint  Patient presents with   Diabetes   Hyperlipidemia   Anxiety   Depression   Asthma    Patient states that her asthma has gotten worse since having Covid twice.     HPI: JUNIA NYGREN is a 65 y.o. female  Feels wheezing is worse wakes up in the middle of the night with wheezing and Sob x 1 month. No nasal d/c , no fever no chills no Cough no purulent phlegm  .worse since she had COVID in feb has had covid twice   Weight gain 196 lbs today taking trulicity 3 mg would like to change to a different one 10 lbs since being on truliticity, working out with the trainer x 3 days a week.   Incontinence all the time , needs to wear a pad all the time. Had 4 kids. Has a problem with sneezing and coughing. Has urge incontinence.   Hyperlipidemia This is a chronic problem. Exacerbating diseases include hypothyroidism. Associated symptoms include shortness of breath.  Asthma She complains of chest tightness, cough, frequent throat clearing, shortness of breath and wheezing. There is no difficulty breathing, hemoptysis, hoarse voice or sputum production. Her past medical history is significant for asthma.   Chief Complaint  Patient presents with   Diabetes   Hyperlipidemia   Anxiety   Depression   Asthma    Patient states that her asthma has gotten worse since having Covid twice.     Relevant past medical, surgical, family and social history reviewed and updated as indicated. Interim medical history since our last visit reviewed. Allergies and medications reviewed and updated.  Review of Systems  HENT:  Negative for hoarse voice.   Respiratory:  Positive for cough, shortness of breath and wheezing. Negative for hemoptysis  and sputum production.    Per HPI unless specifically indicated above     Objective:    BP 125/86   Pulse 60   Temp 98.1 F (36.7 C) (Oral)   Ht 4' 11.84" (1.52 m)   Wt 196 lb 12.8 oz (89.3 kg)   SpO2 98%   BMI 38.64 kg/m   Wt Readings from Last 3 Encounters:  11/30/20 196 lb 12.8 oz (89.3 kg)  10/05/20 196 lb (88.9 kg)  09/14/20 190 lb 9.6 oz (86.5 kg)    Physical Exam Vitals and nursing note reviewed.  Constitutional:      General: She is not in acute distress.    Appearance: Normal appearance. She is not ill-appearing or diaphoretic.  Eyes:     Conjunctiva/sclera: Conjunctivae normal.  Pulmonary:     Breath sounds: No rhonchi.  Abdominal:     General: Abdomen is flat. Bowel sounds are normal. There is no distension.     Palpations: Abdomen is soft. There is no mass.     Tenderness: There is no abdominal tenderness. There is no guarding.  Skin:    General: Skin is warm and dry.     Coloration: Skin is not jaundiced.     Findings: No erythema.  Neurological:     Mental Status: She is alert.  Results for orders placed or performed in visit on 08/29/20  Bayer DCA Hb A1c Waived  Result Value Ref Range   HB A1C (BAYER DCA - WAIVED) 5.7 <7.0 %  CBC with Differential/Platelet  Result Value Ref Range   WBC 6.2 3.4 - 10.8 x10E3/uL   RBC 4.51 3.77 - 5.28 x10E6/uL   Hemoglobin 14.1 11.1 - 15.9 g/dL   Hematocrit 41.1 34.0 - 46.6 %   MCV 91 79 - 97 fL   MCH 31.3 26.6 - 33.0 pg   MCHC 34.3 31.5 - 35.7 g/dL   RDW 11.9 11.7 - 15.4 %   Platelets 270 150 - 450 x10E3/uL   Neutrophils 51 Not Estab. %   Lymphs 35 Not Estab. %   Monocytes 6 Not Estab. %   Eos 6 Not Estab. %   Basos 1 Not Estab. %   Neutrophils Absolute 3.3 1.4 - 7.0 x10E3/uL   Lymphocytes Absolute 2.2 0.7 - 3.1 x10E3/uL   Monocytes Absolute 0.4 0.1 - 0.9 x10E3/uL   EOS (ABSOLUTE) 0.4 0.0 - 0.4 x10E3/uL   Basophils Absolute 0.0 0.0 - 0.2 x10E3/uL   Immature Granulocytes 1 Not Estab. %   Immature Grans  (Abs) 0.0 0.0 - 0.1 x10E3/uL  Comprehensive metabolic panel  Result Value Ref Range   Glucose 92 65 - 99 mg/dL   BUN 10 8 - 27 mg/dL   Creatinine, Ser 0.71 0.57 - 1.00 mg/dL   eGFR 94 >59 mL/min/1.73   BUN/Creatinine Ratio 14 12 - 28   Sodium 140 134 - 144 mmol/L   Potassium 3.8 3.5 - 5.2 mmol/L   Chloride 98 96 - 106 mmol/L   CO2 27 20 - 29 mmol/L   Calcium 9.7 8.7 - 10.3 mg/dL   Total Protein 6.9 6.0 - 8.5 g/dL   Albumin 4.5 3.8 - 4.8 g/dL   Globulin, Total 2.4 1.5 - 4.5 g/dL   Albumin/Globulin Ratio 1.9 1.2 - 2.2   Bilirubin Total 0.4 0.0 - 1.2 mg/dL   Alkaline Phosphatase 78 44 - 121 IU/L   AST 16 0 - 40 IU/L   ALT 20 0 - 32 IU/L  Lipid Panel w/o Chol/HDL Ratio  Result Value Ref Range   Cholesterol, Total 195 100 - 199 mg/dL   Triglycerides 176 (H) 0 - 149 mg/dL   HDL 66 >39 mg/dL   VLDL Cholesterol Cal 30 5 - 40 mg/dL   LDL Chol Calc (NIH) 99 0 - 99 mg/dL        Current Outpatient Medications:    albuterol (PROAIR HFA) 108 (90 Base) MCG/ACT inhaler, Inhale 2 puffs into the lungs every 6 (six) hours as needed for wheezing or shortness of breath., Disp: 8.5 g, Rfl: 2   albuterol (PROVENTIL) (2.5 MG/3ML) 0.083% nebulizer solution, USE 1 VIAL VIA NEBULIZER 1 TO 2 TIMES A DAY AS DIRECTED, Disp: 150 mL, Rfl: 1   buPROPion (WELLBUTRIN SR) 150 MG 12 hr tablet, Take 1 tablet (150 mg total) by mouth 3 (three) times daily., Disp: 270 tablet, Rfl: 1   diphenoxylate-atropine (LOMOTIL) 2.5-0.025 MG tablet, diphenoxylate-atropine 2.5 mg-0.025 mg tablet, Disp: , Rfl:    fluticasone (FLONASE) 50 MCG/ACT nasal spray, Place 2 sprays into both nostrils 2 (two) times daily., Disp: 54 mL, Rfl: 4   Fluticasone-Umeclidin-Vilant (TRELEGY ELLIPTA) 100-62.5-25 MCG/INH AEPB, Inhale 1 puff into the lungs daily., Disp: 3 each, Rfl: 4   hydrochlorothiazide (HYDRODIURIL) 50 MG tablet, Take 1 tablet (50 mg total) by mouth daily., Disp: 90 tablet,  Rfl: 1   hyoscyamine (LEVSIN SL) 0.125 MG SL tablet, TAKE  ONE TABLET BY MOUTH EVERY 4 HOURS AS NEEDED, Disp: 540 tablet, Rfl: 1   LORazepam (ATIVAN) 1 MG tablet, TAKE 1/2 TO 1 TABLET AS NEEDED FOR ANXIETY, Disp: 30 tablet, Rfl: 0   meloxicam (MOBIC) 15 MG tablet, Take 1 tablet (15 mg total) by mouth daily., Disp: 90 tablet, Rfl: 1   methocarbamol (ROBAXIN) 500 MG tablet, methocarbamol 500 mg tablet  TAKE 1 TABLET BY MOUTH EVERY 6 HOURS AS NEEDED FOR MUSCLE SPASMS, Disp: , Rfl:    omeprazole (PRILOSEC) 40 MG capsule, Take 1 capsule (40 mg total) by mouth daily., Disp: 90 capsule, Rfl: 1   ondansetron (ZOFRAN) 8 MG tablet, Take 1 tablet (8 mg total) by mouth every 8 (eight) hours as needed for nausea or vomiting., Disp: 30 tablet, Rfl: 0   oxybutynin (DITROPAN XL) 10 MG 24 hr tablet, Take 1 tablet (10 mg total) by mouth at bedtime. (Patient not taking: Reported on 11/30/2020), Disp: 90 tablet, Rfl: 1   rosuvastatin (CRESTOR) 10 MG tablet, Take 0.5 tablets (5 mg total) by mouth once a week., Disp: 26 tablet, Rfl: 0   TRULICITY 3 DG/7.3HQ SOPN, INJECT 3MG AS DIRECTED ONCE A WEEK, Disp: 2 mL, Rfl: 4   Vitamin D, Ergocalciferol, (DRISDOL) 1.25 MG (50000 UNIT) CAPS capsule, TAKE 1 CAPSULE EVERY 7 DAYS (Patient not taking: Reported on 11/30/2020), Disp: 12 capsule, Rfl: 0    Assessment & Plan:  HLD :  recheck FLP, check LFT's work on diet, SE of meds explained to pt. low fat and high fiber diet explained to pt.  2. Asthma: Will need to start pt on a medrol dose pak. Continue trelegy, albuterol.  3. DM is on trulicity will change to ozempic per pts request. Has lost weight.  check HbA1c,  urine  microalbumin  diabetic diet plan given to pt  adviced regarding hypoglycemia and instructions given to pt today on how to prevent and treat the same if it were to occur. pt acknowledges the plan and voices understanding of the same.  exercise plan given and encouraged.   advice diabetic yearly podiatry, ophthalmology , nutritionist , dental check q 6 months  4.  Incontinence will need to refer to urology for such. Was on myrbetriq for such  Consider oxybutynin for such    Problem List Items Addressed This Visit   None    No orders of the defined types were placed in this encounter.    No orders of the defined types were placed in this encounter.    Follow up plan: No follow-ups on file.

## 2020-12-08 MED ORDER — OXYBUTYNIN CHLORIDE ER 10 MG PO TB24: 10.0000 mg | ORAL_TABLET | Freq: Every day | ORAL | 1 refills | Status: DC

## 2020-12-14 ENCOUNTER — Other Ambulatory Visit: Payer: 59

## 2020-12-20 ENCOUNTER — Other Ambulatory Visit: Payer: Self-pay | Admitting: Family Medicine

## 2020-12-20 ENCOUNTER — Ambulatory Visit
Admission: RE | Admit: 2020-12-20 | Discharge: 2020-12-20 | Disposition: A | Discharge status: Home / Self Care | Payer: 59 | Source: Ambulatory Visit | Attending: Family Medicine | Admitting: Family Medicine

## 2020-12-20 ENCOUNTER — Other Ambulatory Visit: Payer: Self-pay

## 2020-12-20 DIAGNOSIS — R928 Other abnormal and inconclusive findings on diagnostic imaging of breast: Secondary | ICD-10-CM

## 2020-12-20 DIAGNOSIS — N6320 Unspecified lump in the left breast, unspecified quadrant: Secondary | ICD-10-CM

## 2020-12-20 DIAGNOSIS — Z13820 Encounter for screening for osteoporosis: Secondary | ICD-10-CM | POA: Insufficient documentation

## 2020-12-20 DIAGNOSIS — Z1231 Encounter for screening mammogram for malignant neoplasm of breast: Secondary | ICD-10-CM | POA: Diagnosis present

## 2020-12-22 ENCOUNTER — Ambulatory Visit
Admission: RE | Admit: 2020-12-22 | Discharge: 2020-12-22 | Disposition: A | Discharge status: Home / Self Care | Payer: 59 | Source: Ambulatory Visit | Attending: Family Medicine | Admitting: Family Medicine

## 2020-12-22 ENCOUNTER — Encounter: Payer: Self-pay | Admitting: Nurse Practitioner

## 2020-12-22 ENCOUNTER — Other Ambulatory Visit: Payer: Self-pay

## 2020-12-22 ENCOUNTER — Ambulatory Visit: Payer: 59 | Admitting: Nurse Practitioner

## 2020-12-22 VITALS — BP 134/84 | HR 80 | Temp 98.3°F | Ht <= 58 in | Wt 196.2 lb

## 2020-12-22 DIAGNOSIS — R928 Other abnormal and inconclusive findings on diagnostic imaging of breast: Secondary | ICD-10-CM | POA: Insufficient documentation

## 2020-12-22 DIAGNOSIS — N6320 Unspecified lump in the left breast, unspecified quadrant: Secondary | ICD-10-CM | POA: Diagnosis not present

## 2020-12-22 DIAGNOSIS — R0602 Shortness of breath: Secondary | ICD-10-CM

## 2020-12-22 MED ORDER — MONTELUKAST SODIUM 10 MG PO TABS: 10.0000 mg | ORAL_TABLET | Freq: Every day | ORAL | 1 refills | Status: DC

## 2020-12-22 MED ORDER — PREDNISONE 10 MG PO TABS: ORAL_TABLET | ORAL | 0 refills | Status: DC

## 2020-12-22 MED ORDER — DOXYCYCLINE HYCLATE 100 MG PO TABS: 100.0000 mg | ORAL_TABLET | Freq: Two times a day (BID) | ORAL | 0 refills | Status: DC

## 2020-12-22 NOTE — Progress Notes (Signed)
Established Patient Office Visit  Subjective:  Patient ID: [name], female    DOB: 1955/08/24  Age: 66 y.o. MRN: [id number]  CC:  Chief Complaint  Patient presents with   Cough    Wheezing and sob, especially at night    HPI [name] presents for follow up on wheezing and shortness of breath. She states that since she had covid-19 this last time, she has been having more trouble with her breathing. She was placed on medrol dose pak in August along with IM kenalog in office. This helped her symptoms until she finished the medrol dose pak. Since then her symptoms have worsened, especially at night. She has been needing to use her albuterol 2-3 times per day. She is using her flonase and trelegy ellipta daily. She has not seen pulmonology recently. She was taking singulair in the past, however does not know why she stopped taking it, or if she just ran out and never got it refilled. She endorses nasal congestion, post nasal drip. Denies fevers and chest pain.   Past Medical History:  Diagnosis Date   Asthma    Depression    Diabetes mellitus without complication ([place])    Hyperlipidemia    Kidney stones    Migraine headache     Past Surgical History:  Procedure Laterality Date   ABDOMINAL HYSTERECTOMY     BREAST CYST ASPIRATION Right 1981   benign   BREAST CYST ASPIRATION Right 1984   benign   CHOLECYSTECTOMY     COLONOSCOPY  March 2016   FLEXIBLE BRONCHOSCOPY N/A 01/26/2018   Procedure: FLEXIBLE BRONCHOSCOPY;  Surgeon: Laverle Hobby, MD;  Location: [facility] ORS;  Service: Pulmonary;  Laterality: N/A;   MENISCUS REPAIR Left 09/18/2018   OOPHORECTOMY     PARTIAL KNEE ARTHROPLASTY Left 02/2019    Family History  Problem Relation Age of Onset   Hypertension Mother    Cancer Mother        breast   Arthritis Mother    Alzheimer's disease Mother    Parkinson's disease Mother    Breast cancer Mother 28   Hypertension Father    Diabetes Father     Kidney cancer Father    Breast cancer Cousin 2   Breast cancer Maternal Aunt    Breast cancer Maternal Grandmother 34    Social History   Socioeconomic History   Marital status: Single    Spouse name: Not on file   Number of children: Not on file   Years of education: Not on file   Highest education level: Not on file  Occupational History   Not on file  Tobacco Use   Smoking status: Never   Smokeless tobacco: Never  Vaping Use   Vaping Use: Never used  Substance and Sexual Activity   Alcohol use: Yes    Comment: social   Drug use: No   Sexual activity: Not on file  Other Topics Concern   Not on file  Social History Narrative   Not on file   Social Determinants of Health   Financial Resource Strain: Not on file  Food Insecurity: Not on file  Transportation Needs: Not on file  Physical Activity: Not on file  Stress: Not on file  Social Connections: Not on file  Intimate Partner Violence: Not on file    Outpatient Medications Prior to Visit  Medication Sig Dispense Refill   albuterol (PROAIR HFA) 108 (90 Base) MCG/ACT inhaler Inhale 2 puffs into the lungs  every 6 (six) hours as needed for wheezing or shortness of breath. 8.5 g 2   albuterol (PROVENTIL) (2.5 MG/3ML) 0.083% nebulizer solution USE 1 VIAL VIA NEBULIZER 1 TO 2 TIMES A DAY AS DIRECTED 150 mL 1   buPROPion (WELLBUTRIN SR) 150 MG 12 hr tablet Take 1 tablet (150 mg total) by mouth 3 (three) times daily. 270 tablet 1   diphenoxylate-atropine (LOMOTIL) 2.5-0.025 MG tablet diphenoxylate-atropine 2.5 mg-0.025 mg tablet     fluticasone (FLONASE) 50 MCG/ACT nasal spray Place 2 sprays into both nostrils 2 (two) times daily. 54 mL 4   Fluticasone-Umeclidin-Vilant (TRELEGY ELLIPTA) 100-62.5-25 MCG/INH AEPB Inhale 1 puff into the lungs daily. 3 each 4   hydrochlorothiazide (HYDRODIURIL) 50 MG tablet Take 1 tablet (50 mg total) by mouth daily. 90 tablet 1   hyoscyamine (LEVSIN SL) 0.125 MG SL tablet TAKE ONE TABLET BY  MOUTH EVERY 4 HOURS AS NEEDED 540 tablet 1   LORazepam (ATIVAN) 1 MG tablet TAKE 1/2 TO 1 TABLET AS NEEDED FOR ANXIETY 30 tablet 0   meloxicam (MOBIC) 15 MG tablet Take 1 tablet (15 mg total) by mouth daily. 90 tablet 1   methocarbamol (ROBAXIN) 500 MG tablet methocarbamol 500 mg tablet  TAKE 1 TABLET BY MOUTH EVERY 6 HOURS AS NEEDED FOR MUSCLE SPASMS 20 tablet 1   omeprazole (PRILOSEC) 40 MG capsule Take 1 capsule (40 mg total) by mouth daily. 90 capsule 1   ondansetron (ZOFRAN) 8 MG tablet Take 1 tablet (8 mg total) by mouth every 8 (eight) hours as needed for nausea or vomiting. 30 tablet 0   oxybutynin (DITROPAN XL) 10 MG 24 hr tablet Take 1 tablet (10 mg total) by mouth at bedtime. 90 tablet 1   rosuvastatin (CRESTOR) 10 MG tablet Take 1 tablet (10 mg total) by mouth once a week. 30 tablet 3   Semaglutide, 2 MG/DOSE, 8 MG/3ML SOPN Inject 2 mg as directed once a week. 3 mL 5   Vitamin D, Ergocalciferol, (DRISDOL) 1.25 MG (50000 UNIT) CAPS capsule TAKE 1 CAPSULE EVERY 7 DAYS (Patient not taking: Reported on 11/30/2020) 12 capsule 0   methylPREDNISolone (MEDROL DOSEPAK) 4 MG TBPK tablet Use as needed 1 each 1   No facility-administered medications prior to visit.    Allergies  Allergen Reactions   Cymbalta [Duloxetine Hcl] Other (See Comments)    Hallucinations   Amoxicillin Hives   Atorvastatin Other (See Comments)    myalgias    ROS Review of Systems  Constitutional:  Positive for fatigue. Negative for fever.  HENT:  Positive for congestion, postnasal drip and sore throat.   Respiratory:  Positive for cough, shortness of breath and wheezing.   Cardiovascular: Negative.   Gastrointestinal: Negative.   Genitourinary: Negative.   Musculoskeletal: Negative.   Skin: Negative.   Neurological: Negative.      Objective:    Physical Exam Vitals and nursing note reviewed.  Constitutional:      General: She is not in acute distress.    Appearance: Normal appearance.  HENT:      Head: Normocephalic and atraumatic.  Eyes:     Conjunctiva/sclera: Conjunctivae normal.  Cardiovascular:     Rate and Rhythm: Normal rate and regular rhythm.     Pulses: Normal pulses.     Heart sounds: Normal heart sounds.  Pulmonary:     Effort: Pulmonary effort is normal.     Breath sounds: Rhonchi (RLL) present.  Musculoskeletal:     Cervical back: Normal range of motion.  Skin:    General: Skin is warm and dry.  Neurological:     General: No focal deficit present.     Mental Status: She is alert and oriented to person, place, and time.  Psychiatric:        Mood and Affect: Mood normal.        Behavior: Behavior normal.        Thought Content: Thought content normal.        Judgment: Judgment normal.    BP 134/84   Pulse 80   Temp 98.3 F (36.8 C)   Ht 4' 9.44" (1.459 m)   Wt 196 lb 4 oz (89 kg)   SpO2 96%   BMI 41.82 kg/m  Wt Readings from Last 3 Encounters:  12/22/20 196 lb 4 oz (89 kg)  11/30/20 196 lb 12.8 oz (89.3 kg)  10/05/20 196 lb (88.9 kg)     Health Maintenance Due  Topic Date Due   OPHTHALMOLOGY EXAM  10/18/2015   COVID-19 Vaccine (3 - Booster for Pfizer series) 01/08/2020   Zoster Vaccines- Shingrix (2 of 2) 04/19/2020   FOOT EXAM  09/08/2020   INFLUENZA VACCINE  Never done    There are no preventive care reminders to display for this patient.  Lab Results  Component Value Date   TSH 1.610 02/23/2020   Lab Results  Component Value Date   WBC 6.2 08/29/2020   HGB 14.1 08/29/2020   HCT 41.1 08/29/2020   MCV 91 08/29/2020   PLT 270 08/29/2020   Lab Results  Component Value Date   NA 140 08/29/2020   K 3.8 08/29/2020   CO2 27 08/29/2020   GLUCOSE 92 08/29/2020   BUN 10 08/29/2020   CREATININE 0.71 08/29/2020   BILITOT 0.4 08/29/2020   ALKPHOS 78 08/29/2020   AST 16 08/29/2020   ALT 20 08/29/2020   PROT 6.9 08/29/2020   ALBUMIN 4.5 08/29/2020   CALCIUM 9.7 08/29/2020   ANIONGAP 9 01/18/2018   EGFR 94 08/29/2020   Lab Results   Component Value Date   CHOL 195 08/29/2020   Lab Results  Component Value Date   HDL 66 08/29/2020   Lab Results  Component Value Date   LDLCALC 99 08/29/2020   Lab Results  Component Value Date   TRIG 176 (H) 08/29/2020   Lab Results  Component Value Date   CHOLHDL 2.4 05/02/2016   Lab Results  Component Value Date   HGBA1C 5.7 08/29/2020      Assessment & Plan:   Problem List Items Addressed This Visit       Other   SOB (shortness of breath) - Primary    With worsening symptoms, and rhonchi in the right lower lobe, will treat with doxycyline and prednisone taper. Will re-start her singulair. Continue trelegy daily and albuterol as needed. Since she has been having ongoing symptoms, will refer her back to pulmonology. She has not seen them in the last several years. Keep appointment with PCP at the end of this month.        Meds ordered this encounter  Medications   montelukast (SINGULAIR) 10 MG tablet    Sig: Take 1 tablet (10 mg total) by mouth at bedtime.    Dispense:  90 tablet    Refill:  1   doxycycline (VIBRA-TABS) 100 MG tablet    Sig: Take 1 tablet (100 mg total) by mouth 2 (two) times daily.    Dispense:  20 tablet  Refill:  0   predniSONE (DELTASONE) 10 MG tablet    Sig: Take 6 tablets today, then 5 tablets tomorrow, then decrease by 1 tablet every day until gone    Dispense:  21 tablet    Refill:  0    Follow-up: Return if symptoms worsen or fail to improve.    Charyl Dancer, NP

## 2020-12-22 NOTE — Assessment & Plan Note (Addendum)
With worsening symptoms, and rhonchi in the right lower lobe, will treat with doxycyline and prednisone taper. Will re-start her singulair. Continue trelegy daily and albuterol as needed. Since she has been having ongoing symptoms, will refer her back to pulmonology. She has not seen them in the last several years. Keep appointment with PCP at the end of this month.

## 2021-01-02 ENCOUNTER — Encounter: Payer: Self-pay | Admitting: Internal Medicine

## 2021-01-05 NOTE — Telephone Encounter (Signed)
Spoke with pt to get her scheduled. She states she is at the beach and won't be back until the 26th. She said she prefers to talk about it during her appt on 9/30.

## 2021-01-11 ENCOUNTER — Telehealth: Payer: Self-pay | Admitting: Internal Medicine

## 2021-01-11 NOTE — Telephone Encounter (Signed)
[facility] calling to advise  Semaglutide, 2 MG/DOSE, 8 MG/3ML SOPN Is on a long term backorder.  Not even sure it will be available.   Would like to know what medication the dr would like to change this to?  [facility] - [place], [place] - [address]

## 2021-01-12 NOTE — Telephone Encounter (Signed)
Routing to provider to advise.  

## 2021-01-12 NOTE — Telephone Encounter (Signed)
Please let her know that we have samples available- she can come get one. OK to sign out

## 2021-01-15 ENCOUNTER — Encounter: Payer: Self-pay | Admitting: Internal Medicine

## 2021-01-15 NOTE — Telephone Encounter (Signed)
thnx

## 2021-01-15 NOTE — Telephone Encounter (Signed)
Can she do a virtual pl thnx.

## 2021-01-15 NOTE — Telephone Encounter (Signed)
Advised patient she can come pick up samples of Semaglutide per Dr Laural Benes.

## 2021-01-16 ENCOUNTER — Encounter: Payer: Self-pay | Admitting: Internal Medicine

## 2021-01-16 ENCOUNTER — Other Ambulatory Visit: Payer: Self-pay

## 2021-01-16 ENCOUNTER — Telehealth (INDEPENDENT_AMBULATORY_CARE_PROVIDER_SITE_OTHER): Payer: 59 | Admitting: Internal Medicine

## 2021-01-16 DIAGNOSIS — E1169 Type 2 diabetes mellitus with other specified complication: Secondary | ICD-10-CM | POA: Diagnosis not present

## 2021-01-16 DIAGNOSIS — R0602 Shortness of breath: Secondary | ICD-10-CM | POA: Diagnosis not present

## 2021-01-16 MED ORDER — AZITHROMYCIN 250 MG PO TABS: ORAL_TABLET | ORAL | 0 refills | Status: AC

## 2021-01-16 NOTE — Progress Notes (Addendum)
There were no vitals taken for this visit.   Subjective:    Patient ID: [name], female    DOB: 16-May-1955, 65 y.o.   MRN: [id number]  Chief Complaint  Patient presents with   Cough    Had a virtual visit on Saturday for cough, and was seen on 9/2 with Lauren for cough, ws given prednisone, cough Perles, still having cough, patient states cough is getting worse.    HPI: [name] is a 65 y.o. female  Cough This is a new (was seen on 9/2 here, had steorids 20 mg daily bid x 7 days. has been SOBOE , has COPD on trelegy , used her nebs) problem. The current episode started in the past 7 days. Associated symptoms include ear congestion, nasal congestion, shortness of breath and wheezing. Pertinent negatives include no chest pain, chills, ear pain, fever, headaches, heartburn, hemoptysis, myalgias, postnasal drip, rash, rhinorrhea, sore throat, sweats or weight loss.   Chief Complaint  Patient presents with   Cough    Had a virtual visit on Saturday for cough, and was seen on 9/2 with Lauren for cough, ws given prednisone, cough Perles, still having cough, patient states cough is getting worse.    Relevant past medical, surgical, family and social history reviewed and updated as indicated. Interim medical history since our last visit reviewed. Allergies and medications reviewed and updated.  Review of Systems  Constitutional:  Negative for chills, fever and weight loss.  HENT:  Negative for ear pain, postnasal drip, rhinorrhea and sore throat.   Respiratory:  Positive for cough, shortness of breath and wheezing. Negative for hemoptysis.   Cardiovascular:  Negative for chest pain.  Gastrointestinal:  Negative for heartburn.  Musculoskeletal:  Negative for myalgias.  Skin:  Negative for rash.  Neurological:  Negative for headaches.   Per HPI unless specifically indicated above     Objective:    There were no vitals taken for this visit.  Wt Readings from Last  3 Encounters:  12/22/20 196 lb 4 oz (89 kg)  11/30/20 196 lb 12.8 oz (89.3 kg)  10/05/20 196 lb (88.9 kg)    Physical Exam  Unable to peform sec to virtual visit.   Results for orders placed or performed in visit on 08/29/20  Bayer DCA Hb A1c Waived  Result Value Ref Range   HB A1C (BAYER DCA - WAIVED) 5.7 <7.0 %  CBC with Differential/Platelet  Result Value Ref Range   WBC 6.2 3.4 - 10.8 x10E3/uL   RBC 4.51 3.77 - 5.28 x10E6/uL   Hemoglobin 14.1 11.1 - 15.9 g/dL   Hematocrit 41.1 34.0 - 46.6 %   MCV 91 79 - 97 fL   MCH 31.3 26.6 - 33.0 pg   MCHC 34.3 31.5 - 35.7 g/dL   RDW 11.9 11.7 - 15.4 %   Platelets 270 150 - 450 x10E3/uL   Neutrophils 51 Not Estab. %   Lymphs 35 Not Estab. %   Monocytes 6 Not Estab. %   Eos 6 Not Estab. %   Basos 1 Not Estab. %   Neutrophils Absolute 3.3 1.4 - 7.0 x10E3/uL   Lymphocytes Absolute 2.2 0.7 - 3.1 x10E3/uL   Monocytes Absolute 0.4 0.1 - 0.9 x10E3/uL   EOS (ABSOLUTE) 0.4 0.0 - 0.4 x10E3/uL   Basophils Absolute 0.0 0.0 - 0.2 x10E3/uL   Immature Granulocytes 1 Not Estab. %   Immature Grans (Abs) 0.0 0.0 - 0.1 x10E3/uL  Comprehensive metabolic panel  Result Value Ref Range   Glucose 92 65 - 99 mg/dL   BUN 10 8 - 27 mg/dL   Creatinine, Ser 0.71 0.57 - 1.00 mg/dL   eGFR 94 >59 mL/min/1.73   BUN/Creatinine Ratio 14 12 - 28   Sodium 140 134 - 144 mmol/L   Potassium 3.8 3.5 - 5.2 mmol/L   Chloride 98 96 - 106 mmol/L   CO2 27 20 - 29 mmol/L   Calcium 9.7 8.7 - 10.3 mg/dL   Total Protein 6.9 6.0 - 8.5 g/dL   Albumin 4.5 3.8 - 4.8 g/dL   Globulin, Total 2.4 1.5 - 4.5 g/dL   Albumin/Globulin Ratio 1.9 1.2 - 2.2   Bilirubin Total 0.4 0.0 - 1.2 mg/dL   Alkaline Phosphatase 78 44 - 121 IU/L   AST 16 0 - 40 IU/L   ALT 20 0 - 32 IU/L  Lipid Panel w/o Chol/HDL Ratio  Result Value Ref Range   Cholesterol, Total 195 100 - 199 mg/dL   Triglycerides 176 (H) 0 - 149 mg/dL   HDL 66 >39 mg/dL   VLDL Cholesterol Cal 30 5 - 40 mg/dL   LDL Chol Calc  (NIH) 99 0 - 99 mg/dL        Current Outpatient Medications:    albuterol (PROAIR HFA) 108 (90 Base) MCG/ACT inhaler, Inhale 2 puffs into the lungs every 6 (six) hours as needed for wheezing or shortness of breath., Disp: 8.5 g, Rfl: 2   albuterol (PROVENTIL) (2.5 MG/3ML) 0.083% nebulizer solution, USE 1 VIAL VIA NEBULIZER 1 TO 2 TIMES A DAY AS DIRECTED, Disp: 150 mL, Rfl: 1   buPROPion (WELLBUTRIN SR) 150 MG 12 hr tablet, Take 1 tablet (150 mg total) by mouth 3 (three) times daily., Disp: 270 tablet, Rfl: 1   diphenoxylate-atropine (LOMOTIL) 2.5-0.025 MG tablet, diphenoxylate-atropine 2.5 mg-0.025 mg tablet, Disp: , Rfl:    fluticasone (FLONASE) 50 MCG/ACT nasal spray, Place 2 sprays into both nostrils 2 (two) times daily., Disp: 54 mL, Rfl: 4   Fluticasone-Umeclidin-Vilant (TRELEGY ELLIPTA) 100-62.5-25 MCG/INH AEPB, Inhale 1 puff into the lungs daily., Disp: 3 each, Rfl: 4   hydrochlorothiazide (HYDRODIURIL) 50 MG tablet, Take 1 tablet (50 mg total) by mouth daily., Disp: 90 tablet, Rfl: 1   hyoscyamine (LEVSIN SL) 0.125 MG SL tablet, TAKE ONE TABLET BY MOUTH EVERY 4 HOURS AS NEEDED, Disp: 540 tablet, Rfl: 1   LORazepam (ATIVAN) 1 MG tablet, TAKE 1/2 TO 1 TABLET AS NEEDED FOR ANXIETY, Disp: 30 tablet, Rfl: 0   methocarbamol (ROBAXIN) 500 MG tablet, methocarbamol 500 mg tablet  TAKE 1 TABLET BY MOUTH EVERY 6 HOURS AS NEEDED FOR MUSCLE SPASMS, Disp: 20 tablet, Rfl: 1   montelukast (SINGULAIR) 10 MG tablet, Take 1 tablet (10 mg total) by mouth at bedtime., Disp: 90 tablet, Rfl: 1   omeprazole (PRILOSEC) 40 MG capsule, Take 1 capsule (40 mg total) by mouth daily., Disp: 90 capsule, Rfl: 1   ondansetron (ZOFRAN) 8 MG tablet, Take 1 tablet (8 mg total) by mouth every 8 (eight) hours as needed for nausea or vomiting., Disp: 30 tablet, Rfl: 0   oxybutynin (DITROPAN XL) 10 MG 24 hr tablet, Take 1 tablet (10 mg total) by mouth at bedtime., Disp: 90 tablet, Rfl: 1   rosuvastatin (CRESTOR) 10 MG tablet,  Take 1 tablet (10 mg total) by mouth once a week., Disp: 30 tablet, Rfl: 3   Semaglutide, 2 MG/DOSE, 8 MG/3ML SOPN, Inject 2 mg as directed once a week., Disp:  3 mL, Rfl: 5   meloxicam (MOBIC) 15 MG tablet, Take 1 tablet (15 mg total) by mouth daily. (Patient not taking: Reported on 01/16/2021), Disp: 90 tablet, Rfl: 1   predniSONE (DELTASONE) 20 MG tablet, Take 20 mg by mouth 2 (two) times daily., Disp: , Rfl:    Vitamin D, Ergocalciferol, (DRISDOL) 1.25 MG (50000 UNIT) CAPS capsule, TAKE 1 CAPSULE EVERY 7 DAYS (Patient not taking: No sig reported), Disp: 12 capsule, Rfl: 0    Assessment & Plan:  Persistent cough : Check CXR  PCR for COVID  Home test -ve Start zpak Continue allegra and flonase.     Problem List Items Addressed This Visit   None    Orders Placed This Encounter  Procedures   DG Chest 2 View   [facility] Hb A1c Waived   Lipid panel   Vitamin B12   Urinalysis, Routine w reflex microscopic   Comprehensive metabolic panel     Meds ordered this encounter  Medications   azithromycin (ZITHROMAX) 250 MG tablet    Sig: Take 2 tablets on day 1, then 1 tablet daily on days 2 through 5    Dispense:  6 tablet    Refill:  0     Follow up plan: No follow-ups on file.   This visit was completed via telephone due to the restrictions of the COVID-19 pandemic. All issues as above were discussed and addressed but no physical exam was performed. If it was felt that the patient should be evaluated in the office, they were directed there. The patient verbally consented to this visit. Patient was unable to complete an audio/visual visit due to Technical difficulties", "Lack of internet. Due to the catastrophic nature of the COVID-19 pandemic, this visit was done through audio contact only. Location of the patient: home Location of the provider: work Those involved with this call:  Provider: Charlynne Cousins, MD CMA: Frazier Butt, [place] Desk/Registration: Myrlene Broker  Time  spent on call:  10 minutes on the phone discussing health concerns. 10 minutes total spent in review of patient's record and preparation of their chart.

## 2021-01-17 ENCOUNTER — Ambulatory Visit
Admission: RE | Admit: 2021-01-17 | Discharge: 2021-01-17 | Disposition: A | Discharge status: Home / Self Care | Payer: 59 | Source: Ambulatory Visit | Attending: Internal Medicine | Admitting: Internal Medicine

## 2021-01-17 ENCOUNTER — Other Ambulatory Visit: Payer: 59

## 2021-01-17 ENCOUNTER — Encounter: Payer: Self-pay | Admitting: Internal Medicine

## 2021-01-17 ENCOUNTER — Ambulatory Visit
Admission: RE | Admit: 2021-01-17 | Discharge: 2021-01-17 | Disposition: A | Discharge status: Home / Self Care | Payer: 59 | Attending: Internal Medicine | Admitting: Internal Medicine

## 2021-01-17 ENCOUNTER — Other Ambulatory Visit: Payer: Self-pay

## 2021-01-17 DIAGNOSIS — R0602 Shortness of breath: Secondary | ICD-10-CM | POA: Diagnosis not present

## 2021-01-17 DIAGNOSIS — E785 Hyperlipidemia, unspecified: Secondary | ICD-10-CM

## 2021-01-17 DIAGNOSIS — E1169 Type 2 diabetes mellitus with other specified complication: Secondary | ICD-10-CM

## 2021-01-17 DIAGNOSIS — N39498 Other specified urinary incontinence: Secondary | ICD-10-CM

## 2021-01-17 DIAGNOSIS — I10 Essential (primary) hypertension: Secondary | ICD-10-CM

## 2021-01-17 LAB — URINALYSIS, ROUTINE W REFLEX MICROSCOPIC
Bilirubin, UA: NEGATIVE
Glucose, UA: NEGATIVE
Ketones, UA: NEGATIVE
Leukocytes,UA: NEGATIVE
Nitrite, UA: NEGATIVE
Protein,UA: NEGATIVE
RBC, UA: NEGATIVE
Specific Gravity, UA: 1.015 (ref 1.005–1.030)
Urobilinogen, Ur: 1 mg/dL (ref 0.2–1.0)
pH, UA: 7 (ref 5.0–7.5)

## 2021-01-17 LAB — BAYER DCA HB A1C WAIVED: HB A1C (BAYER DCA - WAIVED): 5.3 % (ref 4.8–5.6)

## 2021-01-17 NOTE — Progress Notes (Signed)
Please let pt know this was normal.

## 2021-01-18 LAB — CMP14+EGFR
ALT: 20 IU/L (ref 0–32)
AST: 10 IU/L (ref 0–40)
Albumin/Globulin Ratio: 2.2 (ref 1.2–2.2)
Albumin: 4.3 g/dL (ref 3.8–4.8)
Alkaline Phosphatase: 69 IU/L (ref 44–121)
BUN/Creatinine Ratio: 19 (ref 12–28)
BUN: 15 mg/dL (ref 8–27)
Bilirubin Total: 0.4 mg/dL (ref 0.0–1.2)
CO2: 30 mmol/L — ABNORMAL HIGH (ref 20–29)
Calcium: 9 mg/dL (ref 8.7–10.3)
Chloride: 99 mmol/L (ref 96–106)
Creatinine, Ser: 0.77 mg/dL (ref 0.57–1.00)
Globulin, Total: 2 g/dL (ref 1.5–4.5)
Glucose: 80 mg/dL (ref 70–99)
Potassium: 3.4 mmol/L — ABNORMAL LOW (ref 3.5–5.2)
Sodium: 142 mmol/L (ref 134–144)
Total Protein: 6.3 g/dL (ref 6.0–8.5)
eGFR: 85 mL/min/{1.73_m2} (ref >59)

## 2021-01-18 LAB — VITAMIN B12: Vitamin B-12: 1616 pg/mL — ABNORMAL HIGH (ref 232–1245)

## 2021-01-19 ENCOUNTER — Telehealth: Payer: 59 | Admitting: Internal Medicine

## 2021-01-19 ENCOUNTER — Encounter: Payer: Self-pay | Admitting: Internal Medicine

## 2021-01-19 DIAGNOSIS — E785 Hyperlipidemia, unspecified: Secondary | ICD-10-CM

## 2021-01-19 DIAGNOSIS — K219 Gastro-esophageal reflux disease without esophagitis: Secondary | ICD-10-CM

## 2021-01-19 DIAGNOSIS — J209 Acute bronchitis, unspecified: Secondary | ICD-10-CM

## 2021-01-19 DIAGNOSIS — J440 Chronic obstructive pulmonary disease with acute lower respiratory infection: Secondary | ICD-10-CM

## 2021-01-19 DIAGNOSIS — J449 Chronic obstructive pulmonary disease, unspecified: Secondary | ICD-10-CM | POA: Insufficient documentation

## 2021-01-19 MED ORDER — ALBUTEROL SULFATE HFA 108 (90 BASE) MCG/ACT IN AERS: 2.0000 | INHALATION_SPRAY | Freq: Four times a day (QID) | RESPIRATORY_TRACT | 6 refills | Status: DC | PRN

## 2021-01-19 MED ORDER — SEMAGLUTIDE (2 MG/DOSE) 8 MG/3ML ~~LOC~~ SOPN: 2.0000 mg | PEN_INJECTOR | SUBCUTANEOUS | 5 refills | Status: DC

## 2021-01-19 NOTE — Progress Notes (Signed)
 There were no vitals taken for this visit.   Subjective:    Patient ID: [name], female    DOB: 06/18/1955, 65 y.o.   MRN: [id number]  Chief Complaint  Patient presents with   Shortness of Breath    SOB has been a little better.     HPI: [name] is a 65 y.o. 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 The current episode started in the past 7 days. Associated symptoms comments: Feeling better today no cough, no fever, some SOBOE left over from her infection.  Hyperlipidemia This is a chronic problem. The problem is controlled. Associated symptoms include shortness of breath.   Chief Complaint  Patient presents with   Shortness of Breath    SOB has been a little better.     Relevant past medical, surgical, family and social history reviewed and updated as indicated. Interim medical history since our last visit reviewed. Allergies and medications reviewed and updated.  Review of Systems  Respiratory:  Positive for shortness of breath.    Per HPI unless specifically indicated above     Objective:    There were no vitals taken for this visit.  Wt Readings from Last 3 Encounters:  12/22/20 196 lb 4 oz (89 kg)  11/30/20 196 lb 12.8 oz (89.3 kg)  10/05/20 196 lb (88.9 kg)    Physical Exam Vitals and nursing note reviewed.  Constitutional:      General: She is not in acute distress.    Appearance: Normal appearance. She is not ill-appearing or diaphoretic.  Eyes:     Conjunctiva/sclera: Conjunctivae normal.  Abdominal:     General: Abdomen is flat. There is no distension.  Skin:    General: Skin is warm and dry.     Coloration: Skin is not jaundiced.  Neurological:     Mental Status: She is alert.  Psychiatric:         Mood and Affect: Mood normal.        Behavior: Behavior normal.    Results for orders placed or performed in visit on 01/17/21  Urinalysis, Routine w reflex microscopic  Result Value Ref Range   Specific Gravity, UA 1.015 1.005 - 1.030   pH, UA 7.0 5.0 - 7.5   Color, UA Yellow Yellow   Appearance Ur Clear Clear   Leukocytes,UA Negative Negative   Protein,UA Negative Negative/Trace   Glucose, UA Negative Negative   Ketones, UA Negative Negative   RBC, UA Negative Negative   Bilirubin, UA Negative Negative   Urobilinogen, Ur 1.0 0.2 - 1.0 mg/dL   Nitrite, UA Negative Negative  Vitamin B12  Result Value Ref Range   Vitamin B-12 1,616 (H) 232 - 1,245 pg/mL  CMP14+EGFR  Result Value Ref Range   Glucose 80 70 - 99 mg/dL   BUN 15 8 - 27 mg/dL   Creatinine, Ser 0.77 0.57 - 1.00 mg/dL   eGFR 85 >59 mL/min/1.73   BUN/Creatinine Ratio 19 12 - 28   Sodium 142 134 - 144 mmol/L   Potassium 3.4 (L) 3.5 - 5.2 mmol/L   Chloride 99 96 - 106 mmol/L   CO2 30 (H) 20 - 29 mmol/L   Calcium 9.0 8.7 -   10.3 mg/dL   Total Protein 6.3 6.0 - 8.5 g/dL   Albumin 4.3 3.8 - 4.8 g/dL   Globulin, Total 2.0 1.5 - 4.5 g/dL   Albumin/Globulin Ratio 2.2 1.2 - 2.2   Bilirubin Total 0.4 0.0 - 1.2 mg/dL   Alkaline Phosphatase 69 44 - 121 IU/L   AST 10 0 - 40 IU/L   ALT 20 0 - 32 IU/L  Bayer DCA Hb A1c Waived  Result Value Ref Range   HB A1C (BAYER DCA - WAIVED) 5.3 4.8 - 5.6 %        Current Outpatient Medications:    albuterol (PROAIR HFA) 108 (90 Base) MCG/ACT inhaler, Inhale 2 puffs into the lungs every 6 (six) hours as needed for wheezing or shortness of breath., Disp: 8.5 g, Rfl: 2   albuterol (PROVENTIL) (2.5 MG/3ML) 0.083% nebulizer solution, USE 1 VIAL VIA NEBULIZER 1 TO 2 TIMES A DAY AS DIRECTED, Disp: 150 mL, Rfl: 1   azithromycin (ZITHROMAX) 250 MG tablet, Take 2 tablets on day 1, then 1 tablet daily on days 2 through 5, Disp: 6 tablet, Rfl: 0   buPROPion (WELLBUTRIN SR) 150 MG 12 hr tablet,  Take 1 tablet (150 mg total) by mouth 3 (three) times daily., Disp: 270 tablet, Rfl: 1   diphenoxylate-atropine (LOMOTIL) 2.5-0.025 MG tablet, diphenoxylate-atropine 2.5 mg-0.025 mg tablet, Disp: , Rfl:    fluticasone (FLONASE) 50 MCG/ACT nasal spray, Place 2 sprays into both nostrils 2 (two) times daily., Disp: 54 mL, Rfl: 4   Fluticasone-Umeclidin-Vilant (TRELEGY ELLIPTA) 100-62.5-25 MCG/INH AEPB, Inhale 1 puff into the lungs daily., Disp: 3 each, Rfl: 4   hydrochlorothiazide (HYDRODIURIL) 50 MG tablet, Take 1 tablet (50 mg total) by mouth daily., Disp: 90 tablet, Rfl: 1   hyoscyamine (LEVSIN SL) 0.125 MG SL tablet, TAKE ONE TABLET BY MOUTH EVERY 4 HOURS AS NEEDED, Disp: 540 tablet, Rfl: 1   LORazepam (ATIVAN) 1 MG tablet, TAKE 1/2 TO 1 TABLET AS NEEDED FOR ANXIETY, Disp: 30 tablet, Rfl: 0   meloxicam (MOBIC) 15 MG tablet, Take 1 tablet (15 mg total) by mouth daily., Disp: 90 tablet, Rfl: 1   methocarbamol (ROBAXIN) 500 MG tablet, methocarbamol 500 mg tablet  TAKE 1 TABLET BY MOUTH EVERY 6 HOURS AS NEEDED FOR MUSCLE SPASMS, Disp: 20 tablet, Rfl: 1   montelukast (SINGULAIR) 10 MG tablet, Take 1 tablet (10 mg total) by mouth at bedtime., Disp: 90 tablet, Rfl: 1   omeprazole (PRILOSEC) 40 MG capsule, Take 1 capsule (40 mg total) by mouth daily., Disp: 90 capsule, Rfl: 1   ondansetron (ZOFRAN) 8 MG tablet, Take 1 tablet (8 mg total) by mouth every 8 (eight) hours as needed for nausea or vomiting., Disp: 30 tablet, Rfl: 0   oxybutynin (DITROPAN XL) 10 MG 24 hr tablet, Take 1 tablet (10 mg total) by mouth at bedtime., Disp: 90 tablet, Rfl: 1   predniSONE (DELTASONE) 20 MG tablet, Take 20 mg by mouth 2 (two) times daily., Disp: , Rfl:    rosuvastatin (CRESTOR) 10 MG tablet, Take 1 tablet (10 mg total) by mouth once a week., Disp: 30 tablet, Rfl: 3   Semaglutide, 2 MG/DOSE, 8 MG/3ML SOPN, Inject 2 mg as directed once a week., Disp: 3 mL, Rfl: 5   Vitamin D, Ergocalciferol, (DRISDOL) 1.25 MG (50000 UNIT)  CAPS capsule, TAKE 1 CAPSULE EVERY 7 DAYS, Disp: 12 capsule, Rfl: 0    Assessment & Plan:  COPD / Ac exac of such  Improved stable. Continue trelegy  and albuterol as needed Is on montelukast continue   HLD stable LDL much improved   HLD recheck FLP, check LFT's work on diet, SE of meds explained to pt. low fat and high fiber diet explained to pt.    HTN :  Continue current meds.  Medication compliance emphasised. pt advised to keep Bp logs. Pt verbalised understanding of the same. Pt to have a low salt diet . Exercise to reach a goal of at least 150 mins a week.  lifestyle modifications explained and pt understands importance of the above.  Obeisty continue ozempic  Lifestyle modifications advised to pt. A1c at 5.3  Portion control and avoiding high carb low fat diet advised.  Diet plan given to pt   exercise plan given and encouraged.  To increase exercise to 150 mins a week ie 21/2 hours a week. Pt verbalises understanding of the above.   Problem List Items Addressed This Visit   None    No orders of the defined types were placed in this encounter.    No orders of the defined types were placed in this encounter.    Follow up plan: No follow-ups on file.   

## 2021-01-22 ENCOUNTER — Ambulatory Visit: Payer: 59 | Admitting: Urology

## 2021-02-13 ENCOUNTER — Encounter: Payer: Self-pay | Admitting: Internal Medicine

## 2021-02-13 ENCOUNTER — Other Ambulatory Visit: Payer: Self-pay

## 2021-02-13 ENCOUNTER — Ambulatory Visit: Payer: 59 | Admitting: Internal Medicine

## 2021-02-13 VITALS — BP 144/88 | HR 91 | Temp 97.1°F | Ht 59.0 in | Wt 192.6 lb

## 2021-02-13 DIAGNOSIS — J4540 Moderate persistent asthma, uncomplicated: Secondary | ICD-10-CM | POA: Diagnosis not present

## 2021-02-13 DIAGNOSIS — G4719 Other hypersomnia: Secondary | ICD-10-CM | POA: Diagnosis not present

## 2021-02-13 DIAGNOSIS — J4520 Mild intermittent asthma, uncomplicated: Secondary | ICD-10-CM | POA: Diagnosis not present

## 2021-02-13 DIAGNOSIS — J42 Unspecified chronic bronchitis: Secondary | ICD-10-CM

## 2021-02-13 NOTE — Patient Instructions (Signed)
Continue Trelegy as prescribed Avoid allergens Avoid secondhand smoke Obtain pulmonary function testing Obtain home sleep test to assess for sleep apnea Continue weight loss regimen ENT referral for chronic sinusitis

## 2021-02-13 NOTE — Progress Notes (Signed)
[facility] Consultation     SYNOPSIS 65 year old female never smoker followed in our office for asthma, chronic bronchitis  PMH: Hyperlipidemia, diabetes, morbid obesity, anxiety Smoker/ Smoking History: Never smoker Maintenance: Symbicort 160 Previous Pt of: Dr. 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  She has dealt with recurrent episodes of bronchitis ever since childhood.  She is unclear as to whether or not she was born prematurely.  She developed shortness of breath and cough in 2019.  She underwent lung function testing, CT scanning of the chest and a bronchoscopy in September through October 2019.  CT scanning of the chest showed bilateral cystic change with little progression since a CT scan performed in 2006.   TESTS **Pulmonary function test 01/15/2018>> tracings personally reviewed.  FVC 72% predicted, there is no significant improvement bronchodilator.  FEV1 is 81% predicted, there is no significant improvement bronchodilator.  Ratio is 90%.  TLC is 80% protected, RV is 85%.  RV to TLC ratio is 110%.  Flow volume loop appears mildly restricted.  DLCO was not reported. -Overall this test shows normal pulmonary functions without evidence of COPD.  **ACE, ANCA, alpha-1 11/20/2017>> ACE level 31, ANCA negative, alpha-1 level 150. **CT chest 12/09/2017>> reduced lung volumes secondary to obesity, multiple bilateral small emphysematous blebs, evidence of air-trapping. **Absolute eosinophil count 10/07/2017>> 200 **IgE 01/16/16 @[facility] ; 31   Chest imaging: September 2019 CT chest  showing diffuse groundglass with patchy air trapping, there are multiple cysts more predominant in the bases of the lungs.   >>>Would compared to images going back to 2017 and even further back in 2006 there is no significant change in what appears to be chronic groundglass and cystic change.   Labs: June 2019 IgE within normal  limits, total eosinophil count 100 August 2019 alpha-1 phenotype MM, total level 150, August 2019 C-ANCA, P-ANCA both normal October 2019 immunoglobulin level: IgG slightly low at 651, IgM, IgA normal  Path: October 2019 bronchoscopy endobronchial biopsy showed chronic bronchitis changes      CC  Follow up ASTHMA, chronic bronchitis   HPI:   Previously seen in our office 2 years ago for abnormal CT scan with blebs and cystic changes with air trapping Extensive work-up has been completed with alpha-1 IgE ANCA levels all being within normal limits and negative Pulmonary function testing in 2019 did not show any significant abnormalities  Patient has signs of chronic sinus congestion which leads to chest congestion cough and wheezing every other month Patient is currently on Flonase also on inhaled triple therapy with Trelegy Both seem to help  No exacerbation at this time No evidence of heart failure at this time No evidence or signs of infection at this time No respiratory distress No fevers, chills, nausea, vomiting, diarrhea No evidence of lower extremity edema No evidence hemoptysis      Patient is seen today for problems and issues with sleep related to excessive daytime sleepiness Patient has been having extreme fatigue and tiredness, lack of energy +  very Loud snoring every night + struggling breathe at night and gasps for air   Discussed sleep data and reviewed with patient.  Encouraged proper weight management.  Discussed driving precautions and its relationship with hypersomnolence.  Discussed operating dangerous equipment and its relationship with hypersomnolence.  Discussed sleep hygiene, and benefits of a fixed sleep waked time.  The importance of getting eight or more hours of sleep discussed  with patient.  Discussed limiting the use of the computer and television before bedtime.  Decrease naps during the day, so night time sleep will become enhanced.   Limit caffeine, and sleep deprivation.  HTN, stroke, and heart failure are potential risk factors.   Discussed risk of untreated sleep apnea including cardiac arrhthymias, stroke, DM, pulm HTN.    EPWORTH SLEEP SCORE 0      Medication:    Current Outpatient Medications:    albuterol (PROAIR HFA) 108 (90 Base) MCG/ACT inhaler, Inhale 2 puffs into the lungs every 6 (six) hours as needed for wheezing or shortness of breath., Disp: 8.5 g, Rfl: 6   albuterol (PROVENTIL) (2.5 MG/3ML) 0.083% nebulizer solution, USE 1 VIAL VIA NEBULIZER 1 TO 2 TIMES A DAY AS DIRECTED, Disp: 150 mL, Rfl: 1   buPROPion (WELLBUTRIN SR) 150 MG 12 hr tablet, Take 1 tablet (150 mg total) by mouth 3 (three) times daily., Disp: 270 tablet, Rfl: 1   diphenoxylate-atropine (LOMOTIL) 2.5-0.025 MG tablet, diphenoxylate-atropine 2.5 mg-0.025 mg tablet, Disp: , Rfl:    fluticasone (FLONASE) 50 MCG/ACT nasal spray, Place 2 sprays into both nostrils 2 (two) times daily., Disp: 54 mL, Rfl: 4   Fluticasone-Umeclidin-Vilant (TRELEGY ELLIPTA) 100-62.5-25 MCG/INH AEPB, Inhale 1 puff into the lungs daily., Disp: 3 each, Rfl: 4   hydrochlorothiazide (HYDRODIURIL) 50 MG tablet, Take 1 tablet (50 mg total) by mouth daily., Disp: 90 tablet, Rfl: 1   hyoscyamine (LEVSIN SL) 0.125 MG SL tablet, TAKE ONE TABLET BY MOUTH EVERY 4 HOURS AS NEEDED, Disp: 540 tablet, Rfl: 1   meloxicam (MOBIC) 15 MG tablet, Take 1 tablet (15 mg total) by mouth daily., Disp: 90 tablet, Rfl: 1   methocarbamol (ROBAXIN) 500 MG tablet, methocarbamol 500 mg tablet  TAKE 1 TABLET BY MOUTH EVERY 6 HOURS AS NEEDED FOR MUSCLE SPASMS, Disp: 20 tablet, Rfl: 1   montelukast (SINGULAIR) 10 MG tablet, Take 1 tablet (10 mg total) by mouth at bedtime., Disp: 90 tablet, Rfl: 1   omeprazole (PRILOSEC) 40 MG capsule, Take 1 capsule (40 mg total) by mouth daily., Disp: 90 capsule, Rfl: 1   ondansetron (ZOFRAN) 8 MG tablet, Take 1 tablet (8 mg total) by mouth every 8 (eight) hours  as needed for nausea or vomiting., Disp: 30 tablet, Rfl: 0   oxybutynin (DITROPAN XL) 10 MG 24 hr tablet, Take 1 tablet (10 mg total) by mouth at bedtime., Disp: 90 tablet, Rfl: 1   predniSONE (DELTASONE) 20 MG tablet, Take 20 mg by mouth 2 (two) times daily., Disp: , Rfl:    rosuvastatin (CRESTOR) 10 MG tablet, Take 1 tablet (10 mg total) by mouth once a week., Disp: 30 tablet, Rfl: 3   Semaglutide, 2 MG/DOSE, 8 MG/3ML SOPN, Inject 2 mg as directed once a week., Disp: 3 mL, Rfl: 5   Vitamin D, Ergocalciferol, (DRISDOL) 1.25 MG (50000 UNIT) CAPS capsule, TAKE 1 CAPSULE EVERY 7 DAYS, Disp: 12 capsule, Rfl: 0   Allergies:  Cymbalta [duloxetine hcl], Amoxicillin, and Atorvastatin   Review of Systems:  Gen:  Denies  fever, sweats, chills weight loss  HEENT: Denies blurred vision, double vision, ear pain, eye pain, hearing loss, nose bleeds, sore throat Cardiac:  No dizziness, chest pain or heaviness, chest tightness,edema, No JVD Resp:   No cough, -sputum production, -shortness of breath,-wheezing, -hemoptysis,  Other:  All other systems negative  BP (!) 144/88 (BP Location: Left Arm, Patient Position: Sitting, Cuff Size: Large)   Pulse 91   Temp Marland Kitchen)  97.1 F (36.2 C) (Oral)   Ht 4\' 11"  (1.499 m)   Wt 192 lb 9.6 oz (87.4 kg)   SpO2 97%   BMI 38.90 kg/m     Physical Examination:   General Appearance: No distress  Neuro:without focal findings, mental status, speech normal, alert and oriented HEENT: PERRLA, EOM intact Pulmonary: No wheezing, No rales  CardiovascularNormal S1,S2.  No m/r/g.         Assessment and Plan:  65 year old pleasant white female seen today after several years for ongoing symptoms of recurrent asthma exacerbations every other month in the setting of morbid obesity with probable underlying sleep apnea  With a history of abnormal CT scan findings with cystic changes with bilateral airspace disease and groundglass opacifications in previous CT  scans  MODERATE PERSISTENT ASTHMA Continue Trelegy as prescribed Albuterol as needed   avoid allergens  Avoid secondhand smoke Obtain pulmonary function testing to assess lung function  Excessive daytime sleepiness with probable underlying sleep apnea Obtain home sleep test to assess for sleep apnea  Obesity -recommend significant weight loss -recommend changing diet  Deconditioned state -Recommend increased daily activity and exercise   Asthmatic chronic bronchitis After further evaluation and therapy with inhaled We will discuss the need for biological and immunological therapy and next visit  Recurrent sinus infection and sinus problems Referral to ENT needed    ABNORMAL CT CHEST  Emphysematous blebs. - Alpha-1 normal, PFT does not show evidence of emphysema. May consider repeat  CT chest  Allergic rhinitis. - Continue Flonase.   MEDICATION ADJUSTMENTS/LABS AND TESTS ORDERED: Continue Trelegy as prescribed Avoid allergens Avoid secondhand smoke Obtain pulmonary function testing Obtain home sleep test to assess for sleep apnea Continue weight loss regimen ENT referral for chronic sinusitis   CURRENT MEDICATIONS REVIEWED AT LENGTH WITH PATIENT TODAY   Patient  satisfied with Plan of action and management. All questions answered  Follow up  6 months  Total Time Spent 35 mins   [address] Bamberg, M.D.  [facility]  Medical Director [facility] Medical Director [facility] Cardio-Pulmonary Department

## 2021-02-19 ENCOUNTER — Telehealth: Payer: Self-pay | Admitting: Internal Medicine

## 2021-02-19 NOTE — Telephone Encounter (Signed)
Copied from CRM [id number]. Topic: General - Other >> Feb 16, 2021 10:17 AM Gaetana Michaelis A wrote: Reason for CRM: The patient's pharmacy has called to share that the patient's prescription for the medication Semaglutide, 2 MG/DOSE, 8 MG/3ML SOPN [[id number] is currently on backorder and unable to be filled   The pharmacy would like to be contacted with an alternative prescription or recommendation   Please contact when available

## 2021-02-20 ENCOUNTER — Telehealth: Payer: Self-pay

## 2021-02-20 NOTE — Telephone Encounter (Signed)
Routing to provider to advise on medication.

## 2021-02-20 NOTE — Telephone Encounter (Signed)
Called and LVM in regards to upcoming COVID test. Will try again later. Routing to [place] triage for follow up.

## 2021-02-21 NOTE — Telephone Encounter (Signed)
Patient is aware of date/time of covid test and voiced her understanding. Nothing further needed.   

## 2021-02-21 NOTE — Telephone Encounter (Signed)
Copied from CRM [id number]. Topic: General - Other >> Feb 20, 2021  3:47 PM Gaetana Michaelis A wrote: Reason for CRM: The patient's prescription for [facility], 2 MG/DOSE, 8 MG/3ML SOPN [[id number]]  is currently on long term backorder   Revonda Standard with the patient's pharmacy has called to request an alternative prescription   Please contact further when possible

## 2021-02-22 ENCOUNTER — Other Ambulatory Visit
Admission: RE | Admit: 2021-02-22 | Discharge: 2021-02-22 | Disposition: A | Discharge status: Home / Self Care | Payer: 59 | Source: Ambulatory Visit | Attending: Internal Medicine | Admitting: Internal Medicine

## 2021-02-22 DIAGNOSIS — Z20822 Contact with and (suspected) exposure to covid-19: Secondary | ICD-10-CM | POA: Insufficient documentation

## 2021-02-22 DIAGNOSIS — Z01812 Encounter for preprocedural laboratory examination: Secondary | ICD-10-CM | POA: Diagnosis not present

## 2021-02-23 ENCOUNTER — Other Ambulatory Visit: Payer: Self-pay

## 2021-02-23 ENCOUNTER — Ambulatory Visit: Payer: 59 | Attending: Internal Medicine

## 2021-02-23 DIAGNOSIS — J4540 Moderate persistent asthma, uncomplicated: Secondary | ICD-10-CM | POA: Diagnosis not present

## 2021-02-23 LAB — SARS CORONAVIRUS 2 (TAT 6-24 HRS): SARS Coronavirus 2: NEGATIVE

## 2021-02-23 MED ORDER — ALBUTEROL SULFATE (2.5 MG/3ML) 0.083% IN NEBU
2.5000 mg | INHALATION_SOLUTION | Freq: Once | RESPIRATORY_TRACT | Status: AC
  Administered 2021-02-23: 2.5 mg via RESPIRATORY_TRACT
  Filled 2021-02-23: qty 3

## 2021-02-26 ENCOUNTER — Encounter: Payer: Self-pay | Admitting: Internal Medicine

## 2021-02-26 ENCOUNTER — Telehealth: Payer: Self-pay | Admitting: Internal Medicine

## 2021-02-26 NOTE — Telephone Encounter (Signed)
Needs to d/w pulmonologist see their notes from last visit - can you find out if she is following up w ith them or with me for such. If with me need to go over these with her ? Virtually or inoffice.

## 2021-02-26 NOTE — Telephone Encounter (Signed)
Spoke to patient, who is requesting PFT results.  Dr. Kasa, please advise. Thanks 

## 2021-02-26 NOTE — Telephone Encounter (Signed)
Called patient and informed her that you have not yet seen the results and that she should follow up with Pulmonology about the results

## 2021-02-27 ENCOUNTER — Encounter: Payer: Self-pay | Admitting: Internal Medicine

## 2021-02-28 NOTE — Telephone Encounter (Signed)
Patient is aware of results and voiced her understanding.  Appt scheduled 03/06/2021 at 2:00. Nothing further needed.

## 2021-03-06 ENCOUNTER — Ambulatory Visit: Payer: 59 | Admitting: Internal Medicine

## 2021-03-06 ENCOUNTER — Encounter: Payer: Self-pay | Admitting: Internal Medicine

## 2021-03-06 ENCOUNTER — Other Ambulatory Visit: Payer: Self-pay

## 2021-03-06 VITALS — BP 126/78 | HR 81 | Temp 97.7°F | Ht 59.0 in | Wt 186.8 lb

## 2021-03-06 DIAGNOSIS — J329 Chronic sinusitis, unspecified: Secondary | ICD-10-CM | POA: Diagnosis not present

## 2021-03-06 DIAGNOSIS — Z0189 Encounter for other specified special examinations: Secondary | ICD-10-CM

## 2021-03-06 DIAGNOSIS — I25119 Atherosclerotic heart disease of native coronary artery with unspecified angina pectoris: Secondary | ICD-10-CM

## 2021-03-06 DIAGNOSIS — J4530 Mild persistent asthma, uncomplicated: Secondary | ICD-10-CM | POA: Diagnosis not present

## 2021-03-06 MED ORDER — PREDNISONE 10 MG PO TABS: 40.0000 mg | ORAL_TABLET | Freq: Every day | ORAL | 1 refills | Status: DC

## 2021-03-06 MED ORDER — TRELEGY ELLIPTA 200-62.5-25 MCG/ACT IN AEPB: 1.0000 | INHALATION_SPRAY | RESPIRATORY_TRACT | 10 refills | Status: AC

## 2021-03-06 NOTE — Progress Notes (Signed)
[name] Consultation     SYNOPSIS 65 year old female never smoker followed in our office for asthma, chronic bronchitis  PMH: Hyperlipidemia, diabetes, morbid obesity, anxiety Smoker/ Smoking History: Never smoker Maintenance: Symbicort 160 Previous Pt of: Dr. 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  She has dealt with recurrent episodes of bronchitis ever since childhood.  She is unclear as to whether or not she was born prematurely.  She developed shortness of breath and cough in 2019.  She underwent lung function testing, CT scanning of the chest and a bronchoscopy in September through October 2019.  CT scanning of the chest showed bilateral cystic change with little progression since a CT scan performed in 2006.   TESTS **Pulmonary function test 01/15/2018>> tracings personally reviewed.  FVC 72% predicted, there is no significant improvement bronchodilator.  FEV1 is 81% predicted, there is no significant improvement bronchodilator.  Ratio is 90%.  TLC is 80% protected, RV is 85%.  RV to TLC ratio is 110%.  Flow volume loop appears mildly restricted.  DLCO was not reported. -Overall this test shows normal pulmonary functions without evidence of COPD.  **ACE, ANCA, alpha-1 11/20/2017>> ACE level 31, ANCA negative, alpha-1 level 150. **CT chest 12/09/2017>> reduced lung volumes secondary to obesity, multiple bilateral small emphysematous blebs, evidence of air-trapping. 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    >>>Would compared to images going back to 2017 and even further back in 2006 there is no significant change in what appears to be chronic groundglass and cystic change.   Labs: June 2019 IgE within normal limits, total eosinophil count 100 August 2019 alpha-1 phenotype MM, total level 150, August 2019 C-ANCA, P-ANCA both normal October 2019 immunoglobulin level: IgG slightly low at 651, IgM, IgA normal  Path: October 2019 bronchoscopy endobronchial biopsy showed chronic bronchitis changes    CC  Follow up ASTHMA, chronic bronchitis   HPI:   Follow-up moderate persistent asthma Previously seen in our office 2 years ago for abnormal CT scan with blebs and cystic changes with air trapping Extensive work-up has been completed with alpha-1 IgE ANCA levels all being within normal limits and negative Pulmonary function testing in 2019 did not show any significant abnormalities Repeat pulmonary function testing November 2022  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 +Hyperinflation +reactive airways disease  Patient has signs of chronic sinus congestion which leads to chest congestion cough and wheezing every other month Patient is currently on Flonase also on inhaled triple therapy with Trelegy Will increase Trelegy to 200 to see if this will help with inflammation Also will give prednisone 10 mg daily for 14 days Both seem to help  Mild exacerbation at this time No evidence of heart failure at this time No evidence or signs of infection at this time No respiratory distress No fevers, chills, nausea, vomiting, diarrhea No evidence of lower extremity edema No evidence hemoptysis   Home sleep test and ENT follow-up pending   Medication:    Current Outpatient Medications:    albuterol (PROAIR HFA) 108 (90 Base) MCG/ACT inhaler, Inhale 2 puffs into the lungs  every 6 (six) hours as needed for wheezing or shortness of breath., Disp: 8.5 g,  Rfl: 6   albuterol (PROVENTIL) (2.5 MG/3ML) 0.083% nebulizer solution, USE 1 VIAL VIA NEBULIZER 1 TO 2 TIMES A DAY AS DIRECTED, Disp: 150 mL, Rfl: 1   buPROPion (WELLBUTRIN SR) 150 MG 12 hr tablet, Take 1 tablet (150 mg total) by mouth 3 (three) times daily., Disp: 270 tablet, Rfl: 1   diphenoxylate-atropine (LOMOTIL) 2.5-0.025 MG tablet, diphenoxylate-atropine 2.5 mg-0.025 mg tablet, Disp: , Rfl:    fluticasone (FLONASE) 50 MCG/ACT nasal spray, Place 2 sprays into both nostrils 2 (two) times daily., Disp: 54 mL, Rfl: 4   Fluticasone-Umeclidin-Vilant (TRELEGY ELLIPTA) 100-62.5-25 MCG/INH AEPB, Inhale 1 puff into the lungs daily., Disp: 3 each, Rfl: 4   hydrochlorothiazide (HYDRODIURIL) 50 MG tablet, Take 1 tablet (50 mg total) by mouth daily., Disp: 90 tablet, Rfl: 1   hyoscyamine (LEVSIN SL) 0.125 MG SL tablet, TAKE ONE TABLET BY MOUTH EVERY 4 HOURS AS NEEDED, Disp: 540 tablet, Rfl: 1   meloxicam (MOBIC) 15 MG tablet, Take 1 tablet (15 mg total) by mouth daily., Disp: 90 tablet, Rfl: 1   methocarbamol (ROBAXIN) 500 MG tablet, methocarbamol 500 mg tablet  TAKE 1 TABLET BY MOUTH EVERY 6 HOURS AS NEEDED FOR MUSCLE SPASMS, Disp: 20 tablet, Rfl: 1   montelukast (SINGULAIR) 10 MG tablet, Take 1 tablet (10 mg total) by mouth at bedtime., Disp: 90 tablet, Rfl: 1   omeprazole (PRILOSEC) 40 MG capsule, Take 1 capsule (40 mg total) by mouth daily., Disp: 90 capsule, Rfl: 1   ondansetron (ZOFRAN) 8 MG tablet, Take 1 tablet (8 mg total) by mouth every 8 (eight) hours as needed for nausea or vomiting., Disp: 30 tablet, Rfl: 0   oxybutynin (DITROPAN XL) 10 MG 24 hr tablet, Take 1 tablet (10 mg total) by mouth at bedtime., Disp: 90 tablet, Rfl: 1   predniSONE (DELTASONE) 20 MG tablet, Take 20 mg by mouth 2 (two) times daily., Disp: , Rfl:    rosuvastatin (CRESTOR) 10 MG tablet, Take 1 tablet (10 mg total) by mouth once a week., Disp: 30 tablet, Rfl: 3   Semaglutide, 2 MG/DOSE, 8 MG/3ML SOPN, Inject 2 mg as directed  once a week., Disp: 3 mL, Rfl: 5   Vitamin D, Ergocalciferol, (DRISDOL) 1.25 MG (50000 UNIT) CAPS capsule, TAKE 1 CAPSULE EVERY 7 DAYS, Disp: 12 capsule, Rfl: 0   Allergies:  Cymbalta [duloxetine hcl], Amoxicillin, and Atorvastatin   Review of Systems:  Gen:  Denies  fever, sweats, chills weight loss  HEENT: Denies blurred vision, double vision, ear pain, eye pain, hearing loss, nose bleeds, sore throat Cardiac:  No dizziness, chest pain or heaviness, chest tightness,edema, No JVD Resp:   No cough, -sputum production, -shortness of breath,-wheezing, -hemoptysis,  Other:  All other systems negative  BP 126/78 (BP Location: Left Arm, Patient Position: Sitting, Cuff Size: Normal)   Pulse 81   Temp 97.7 F (36.5 C) (Oral)   Ht 4\' 11"  (1.499 m)   Wt 186 lb 12.8 oz (84.7 kg)   SpO2 98%   BMI 37.73 kg/m     Physical Examination:   General Appearance: No distress  Neuro:without focal findings, mental status, speech normal, alert and oriented HEENT: PERRLA, EOM intact Pulmonary: No wheezing, No rales  CardiovascularNormal S1,S2.  No m/r/g.         Assessment and Plan:  65 year old pleasant white female seen today after several years for ongoing symptoms of recurrent asthma  exacerbations every other month in the setting of morbid obesity with probable underlying sleep apnea  With a history of abnormal CT scan findings with cystic changes with bilateral airspace disease and groundglass opacifications in previous CT scans  MODERATE PERSISTENT ASTHMA Increase Trelegy to 200 as prescribed Albuterol as needed  Avoid secondhand smoke  Excessive daytime sleepiness with probable underlying sleep apnea Obtain home sleep test to assess for sleep apnea Home sleep test pending  Obesity -recommend significant weight loss -recommend changing diet Recommended losing another 40 pounds  Deconditioned state -Recommend increased daily activity and exercise   Asthmatic chronic  bronchitis After further evaluation and therapy with inhaled Recommend increasing Trelegy to 200 and trying prednisone for 2 weeks We will discuss the need for biological and immunological therapy and next visit  Recurrent sinus infection and sinus problems Referral to ENT needed  Atypical chest pain need to consider underlying CAD Recommend cardiology referral   MEDICATION ADJUSTMENTS/LABS AND TESTS ORDERED: Continue Trelegy as prescribed  Obtain pulmonary function testing    CURRENT MEDICATIONS REVIEWED AT LENGTH WITH PATIENT TODAY   Patient  satisfied with Plan of action and management. All questions answered  Follow up  6 months  Total Time Spent 35 mins   Wallis Bamberg Santiago Glad, M.D.  [facility]  Medical Director [name] Medical Director [name] Cardio-Pulmonary Department

## 2021-03-06 NOTE — Patient Instructions (Addendum)
INCREASE TRELEGY to 200 PRED 10 mg daily for 14 days Obtain home sleep test to assess for sleep apnea Continue weight loss regimen ENT referral for chronic sinusitis Avoid allergens Avoid secondhand smoke  NEEDS NEW NEBULIZER MACHINE  CARDIOLOGY REFERRAL TO ASSESS FOR CAD   GREAT JOB WITH WEIGHT LOSS!

## 2021-03-12 ENCOUNTER — Telehealth: Payer: Self-pay | Admitting: Pulmonary Disease

## 2021-03-12 NOTE — Telephone Encounter (Signed)
Received below message from [place].   Lm for patient.    To: Durward Fortes, Barnabas Harries, Olean Ree has made several attempts to reach this patient by leaving voicemails, text, and also went by patient home with no contact. Please advise on what needs to be done.   Thank you

## 2021-03-13 ENCOUNTER — Other Ambulatory Visit: Payer: Self-pay | Admitting: Family Medicine

## 2021-03-13 NOTE — Telephone Encounter (Signed)
Spoke to patient and relayed below message.  Patient stated that she would contact [employer]. Number provided.  Nothing further needed.

## 2021-03-13 NOTE — Telephone Encounter (Signed)
Requested Prescriptions  Pending Prescriptions Disp Refills  . meloxicam (MOBIC) 15 MG tablet [Pharmacy Med Name: MELOXICAM 15 MG TAB] 90 tablet 1    Sig: TAKE 1 TABLET BY MOUTH ONCE DAILY     Analgesics:  COX2 Inhibitors Passed - 03/13/2021  8:51 PM      Passed - HGB in normal range and within 360 days    Hemoglobin  Date Value Ref Range Status  08/29/2020 14.1 11.1 - 15.9 g/dL Final         Passed - Cr in normal range and within 360 days    Creatinine  Date Value Ref Range Status  08/30/2013 0.64 0.60 - 1.30 mg/dL Final   Creatinine, Ser  Date Value Ref Range Status  01/17/2021 0.77 0.57 - 1.00 mg/dL Final         Passed - Patient is not pregnant      Passed - Valid encounter within last 12 months    Recent Outpatient Visits          1 month ago Morbid obesity (HCC)   [facility] Vigg, Avanti, MD   1 month ago SOB (shortness of breath)   [facility] Vigg, Avanti, MD   2 months ago SOB (shortness of breath)   [facility] McElwee, Lauren A, NP   3 months ago SOB (shortness of breath)   [facility] Vigg, Avanti, MD   6 months ago Essential hypertension   [facility] Loomis, [place], DO      Future Appointments            In 2 weeks Agbor-Etang, Arlys John, MD [facility]

## 2021-03-20 ENCOUNTER — Encounter: Payer: Self-pay | Admitting: Internal Medicine

## 2021-03-29 ENCOUNTER — Telehealth: Payer: Self-pay

## 2021-03-29 NOTE — Telephone Encounter (Signed)
Called and spoke to patient about ONO results, Per [facility] patient does not qualify for O2 nothing further needed. Would li8ke her to follow up to be evaluated for OSA.

## 2021-03-30 ENCOUNTER — Encounter: Payer: Self-pay | Admitting: Cardiology

## 2021-03-30 ENCOUNTER — Ambulatory Visit: Payer: 59 | Admitting: Cardiology

## 2021-03-30 ENCOUNTER — Other Ambulatory Visit: Payer: Self-pay

## 2021-03-30 VITALS — BP 140/74 | HR 69 | Ht 59.0 in | Wt 181.2 lb

## 2021-03-30 DIAGNOSIS — E7800 Pure hypercholesterolemia, unspecified: Secondary | ICD-10-CM

## 2021-03-30 DIAGNOSIS — R072 Precordial pain: Secondary | ICD-10-CM

## 2021-03-30 DIAGNOSIS — R0609 Other forms of dyspnea: Secondary | ICD-10-CM | POA: Diagnosis not present

## 2021-03-30 DIAGNOSIS — I10 Essential (primary) hypertension: Secondary | ICD-10-CM

## 2021-03-30 DIAGNOSIS — Z6836 Body mass index (BMI) 36.0-36.9, adult: Secondary | ICD-10-CM

## 2021-03-30 NOTE — Patient Instructions (Signed)
Medication Instructions:  Your physician recommends that you continue on your current medications as directed. Please refer to the Current Medication list given to you today.  *If you need a refill on your cardiac medications before your next appointment, please call your pharmacy*   Lab Work:  None ordered   Testing/Procedures:   Your physician has requested that you have an echocardiogram. Echocardiography is a painless test that uses sound waves to create images of your heart. It provides your doctor with information about the size and shape of your heart and how well your heart's chambers and valves are working. This procedure takes approximately one hour. There are no restrictions for this procedure.  2.   [facility] MYOVIEW       Your caregiver has ordered a Stress Test with nuclear imaging. The purpose of this test is to evaluate the blood supply to your heart muscle. This procedure is referred to as a "Non-Invasive Stress Test." This is because other than having an IV started in your vein, nothing is inserted or "invades" your body. Cardiac stress tests are done to find areas of poor blood flow to the heart by determining the extent of coronary artery disease (CAD). Some patients exercise on a treadmill, which naturally increases the blood flow to your heart, while others who are  unable to walk on a treadmill due to physical limitations have a pharmacologic/chemical stress agent called Lexiscan . This medicine will mimic walking on a treadmill by temporarily increasing your coronary blood flow.      PLEASE REPORT TO [facility] MEDICAL MALL ENTRANCE   THE VOLUNTEERS AT THE FIRST DESK WILL DIRECT YOU WHERE TO GO     *Please note: these test may take anywhere between 2-4 hours to complete       Date of Procedure:_____________________________________   Arrival Time for Procedure:______________________________    PLEASE NOTIFY THE OFFICE AT LEAST 24 HOURS IN ADVANCE IF YOU ARE UNABLE TO  KEEP YOUR APPOINTMENT.  [phone]  PLEASE NOTIFY NUCLEAR MEDICINE AT [facility] AT LEAST 24 HOURS IN ADVANCE IF YOU ARE UNABLE TO KEEP YOUR APPOINTMENT. [phone]         How to prepare for your Myoview test:        _XX___:  Hold medications as follows:   hydrochlorothiazide (HYDRODIURIL)     1. Do not eat or drink after midnight  2. No caffeine for 24 hours prior to test  3. No smoking 24 hours prior to test.  4. Unless instructed otherwise, Take your medication with a small sips of water.    5.         Ladies, please do not wear dresses. Skirts or pants are appropriate. Please wear a short sleeve shirt.  6. No perfume, cologne or lotion.  7. Wear comfortable walking shoes. No heels!      Follow-Up: At [facility], you and your health needs are our priority.  As part of our continuing mission to provide you with exceptional heart care, we have created designated Provider Care Teams.  These Care Teams include your primary Cardiologist (physician) and Advanced Practice Providers (APPs -  Physician Assistants and Nurse Practitioners) who all work together to provide you with the care you need, when you need it.  We recommend signing up for the patient portal called "MyChart".  Sign up information is provided on this After Visit Summary.  MyChart is used to connect with patients for Virtual Visits (Telemedicine).  Patients are able to view lab/test  results, encounter notes, upcoming appointments, etc.  Non-urgent messages can be sent to your provider as well.   To learn more about what you can do with MyChart, go to [email or web address].    Your next appointment:   Follow up after testing   The format for your next appointment:   In Person  Provider:   You may see Debbe Odea, MD or one of the following Advanced Practice Providers on your designated Care Team:   Nicolasa Ducking, NP Eula Listen, PA-C Cadence Fransico Michael, [place]    Other Instructions

## 2021-03-30 NOTE — Progress Notes (Signed)
Cardiology Office Note:    Date:  03/30/2021   ID:  [name], DOB [birth year], MRN [id number]  PCP:  Charlynne Cousins, MD   [facility] Providers Cardiologist:  None     Referring MD: Flora Lipps, MD   Chief Complaint  Patient presents with   New Patient (Initial Visit)    Patient has been having some Shortness of breath and she also has a history of COPD.    [name] is a 65 y.o. female who is being seen today for the evaluation of shortness of breath at the request of Kasa, Kurian, MD.   History of Present Illness:    [name] is a 65 y.o. female with a hx of hypertension, hyperlipidemia, obesity who presents due to shortness of breath and chest pain.  Patient endorsed having shortness of breath for over a year now which is worsened of late.  Symptoms of shortness of breath usually of course when she exerts herself such as going up stairs.  Saw pulmonary medicine, PFTs were obtained, diagnosed with COPD.  She is a never smoker.  Also has occasional chest pain sometimes associated with exertion.  Her father passed from an MI in his 7s.  She denies any personal history of heart disease or heart failure.  Takes all medications as prescribed.  Past Medical History:  Diagnosis Date   Asthma    Depression    Diabetes mellitus without complication ([place])    Hyperlipidemia    Kidney stones    Migraine headache     Past Surgical History:  Procedure Laterality Date   ABDOMINAL HYSTERECTOMY     BREAST CYST ASPIRATION Right 1981   benign   BREAST CYST ASPIRATION Right 1984   benign   CHOLECYSTECTOMY     COLONOSCOPY  March 2016   FLEXIBLE BRONCHOSCOPY N/A 01/26/2018   Procedure: FLEXIBLE BRONCHOSCOPY;  Surgeon: Laverle Hobby, MD;  Location: [facility] ORS;  Service: Pulmonary;  Laterality: N/A;   MENISCUS REPAIR Left 09/18/2018   OOPHORECTOMY     PARTIAL KNEE ARTHROPLASTY Left 02/2019    Current Medications: Current Meds  Medication Sig    albuterol (PROAIR HFA) 108 (90 Base) MCG/ACT inhaler Inhale 2 puffs into the lungs every 6 (six) hours as needed for wheezing or shortness of breath.   albuterol (PROVENTIL) (2.5 MG/3ML) 0.083% nebulizer solution USE 1 VIAL VIA NEBULIZER 1 TO 2 TIMES A DAY AS DIRECTED   buPROPion (WELLBUTRIN SR) 150 MG 12 hr tablet Take 1 tablet (150 mg total) by mouth 3 (three) times daily.   diphenoxylate-atropine (LOMOTIL) 2.5-0.025 MG tablet diphenoxylate-atropine 2.5 mg-0.025 mg tablet   fluticasone (FLONASE) 50 MCG/ACT nasal spray Place 2 sprays into both nostrils 2 (two) times daily.   Fluticasone-Umeclidin-Vilant (TRELEGY ELLIPTA) 100-62.5-25 MCG/INH AEPB Inhale 1 puff into the lungs daily.   hydrochlorothiazide (HYDRODIURIL) 50 MG tablet Take 1 tablet (50 mg total) by mouth daily.   hyoscyamine (LEVSIN SL) 0.125 MG SL tablet TAKE ONE TABLET BY MOUTH EVERY 4 HOURS AS NEEDED   meloxicam (MOBIC) 15 MG tablet TAKE 1 TABLET BY MOUTH ONCE DAILY   methocarbamol (ROBAXIN) 500 MG tablet methocarbamol 500 mg tablet  TAKE 1 TABLET BY MOUTH EVERY 6 HOURS AS NEEDED FOR MUSCLE SPASMS   montelukast (SINGULAIR) 10 MG tablet Take 1 tablet (10 mg total) by mouth at bedtime.   omeprazole (PRILOSEC) 40 MG capsule Take 1 capsule (40 mg total) by mouth daily.   ondansetron (ZOFRAN) 8 MG tablet Take  1 tablet (8 mg total) by mouth every 8 (eight) hours as needed for nausea or vomiting.   oxybutynin (DITROPAN XL) 10 MG 24 hr tablet Take 1 tablet (10 mg total) by mouth at bedtime.   predniSONE (DELTASONE) 10 MG tablet Take 4 tablets (40 mg total) by mouth daily with breakfast. 10 days   predniSONE (DELTASONE) 20 MG tablet Take 20 mg by mouth 2 (two) times daily.   rosuvastatin (CRESTOR) 10 MG tablet Take 1 tablet (10 mg total) by mouth once a week.   Semaglutide, 2 MG/DOSE, 8 MG/3ML SOPN Inject 2 mg as directed once a week.   Vitamin D, Ergocalciferol, (DRISDOL) 1.25 MG (50000 UNIT) CAPS capsule TAKE 1 CAPSULE EVERY 7 DAYS      Allergies:   Cymbalta [duloxetine hcl], Amoxicillin, and Atorvastatin   Social History   Socioeconomic History   Marital status: Single    Spouse name: Not on file   Number of children: Not on file   Years of education: Not on file   Highest education level: Not on file  Occupational History   Not on file  Tobacco Use   Smoking status: Never   Smokeless tobacco: Never  Vaping Use   Vaping Use: Never used  Substance and Sexual Activity   Alcohol use: Yes    Comment: social   Drug use: No   Sexual activity: Not on file  Other Topics Concern   Not on file  Social History Narrative   Not on file   Social Determinants of Health   Financial Resource Strain: Not on file  Food Insecurity: Not on file  Transportation Needs: Not on file  Physical Activity: Not on file  Stress: Not on file  Social Connections: Not on file     Family History: The patient's family history includes Alzheimer's disease in her mother; Arthritis in her mother; Breast cancer in her maternal aunt; Breast cancer (age of onset: 69) in her cousin; Breast cancer (age of onset: 80) in her mother; Breast cancer (age of onset: 39) in her maternal grandmother; Cancer in her mother; Diabetes in her father; Hypertension in her father and mother; Kidney cancer in her father; Parkinson's disease in her mother.  ROS:   Please see the history of present illness.     All other systems reviewed and are negative.  EKGs/Labs/Other Studies Reviewed:    The following studies were reviewed today:   EKG:  EKG is  ordered today.  The ekg ordered today demonstrates sinus rhythm, normal ECG  Recent Labs: 08/29/2020: Hemoglobin 14.1; Platelets 270 01/17/2021: ALT 20; BUN 15; Creatinine, Ser 0.77; Potassium 3.4; Sodium 142  Recent Lipid Panel    Component Value Date/Time   CHOL 195 08/29/2020 1431   CHOL 186 04/07/2018 1137   TRIG 176 (H) 08/29/2020 1431   TRIG 110 04/07/2018 1137   HDL 66 08/29/2020 1431   CHOLHDL  2.4 05/02/2016 1531   VLDL 22 04/07/2018 1137   LDLCALC 99 08/29/2020 1431     Risk Assessment/Calculations:          Physical Exam:    VS:  BP 140/74 (BP Location: Left Arm)   Pulse 69   Ht _0  (1.499 m)   Wt 181 lb 3.2 oz (82.2 kg)   SpO2 99%   BMI 36.60 kg/m     Wt Readings from Last 3 Encounters:  03/30/21 181 lb 3.2 oz (82.2 kg)  03/06/21 186 lb 12.8 oz (84.7 kg)  02/13/21 192  lb 9.6 oz (87.4 kg)     GEN:  Well nourished, well developed in no acute distress HEENT: Normal NECK: No JVD; No carotid bruits LYMPHATICS: No lymphadenopathy CARDIAC: RRR, no murmurs, rubs, gallops RESPIRATORY: Diminished breath sounds, clear lungs no wheezing. ABDOMEN: Soft, non-tender, non-distended MUSCULOSKELETAL:  No edema; No deformity  SKIN: Warm and dry NEUROLOGIC:  Alert and oriented x 3 PSYCHIATRIC:  Normal affect   ASSESSMENT:    1. Precordial pain   2. Dyspnea on exertion   3. Primary hypertension   4. Pure hypercholesterolemia   5. BMI 36.0-36.9,adult    PLAN:    In order of problems listed above:  Chest pain, appears atypical.  Risk factors hypertension, hyperlipidemia.  Get echo and Lexiscan Myoview to evaluate for ischemia. Dyspnea on exertion, cardiac work-up with echo and Lexiscan as above.  Etiology possibly COPD and obesity/deconditioning. Hypertension, BP slightly elevated today, usually controlled.  Continue current meds. Hyperlipidemia, cholesterol controlled.  Continue Crestor 10 mg. Obesity, low-calorie diet, weight loss recommended.  Follow-up after echo and Myoview.      Shared Decision Making/Informed Consent The risks [chest pain, shortness of breath, cardiac arrhythmias, dizziness, blood pressure fluctuations, myocardial infarction, stroke/transient ischemic attack, nausea, vomiting, allergic reaction, radiation exposure, metallic taste sensation and life-threatening complications (estimated to be 1 in 10,000)], benefits (risk stratification,  diagnosing coronary artery disease, treatment guidance) and alternatives of a nuclear stress test were discussed in detail with Ms. Scheuermann and she agrees to proceed.    Medication Adjustments/Labs and Tests Ordered: Current medicines are reviewed at length with the patient today.  Concerns regarding medicines are outlined above.  Orders Placed This Encounter  Procedures   NM Myocar Multi W/Spect W/Wall Motion / EF   EKG 12-Lead   ECHOCARDIOGRAM COMPLETE    No orders of the defined types were placed in this encounter.   Patient Instructions  Medication Instructions:  Your physician recommends that you continue on your current medications as directed. Please refer to the Current Medication list given to you today.  *If you need a refill on your cardiac medications before your next appointment, please call your pharmacy*   Lab Work:  None ordered   Testing/Procedures:   Your physician has requested that you have an echocardiogram. Echocardiography is a painless test that uses sound waves to create images of your heart. It provides your doctor with information about the size and shape of your heart and how well your heart's chambers and valves are working. This procedure takes approximately one hour. There are no restrictions for this procedure.  2.   [place]       Your caregiver has ordered a Stress Test with nuclear imaging. The purpose of this test is to evaluate the blood supply to your heart muscle. This procedure is referred to as a "Non-Invasive Stress Test." This is because other than having an IV started in your vein, nothing is inserted or "invades" your body. Cardiac stress tests are done to find areas of poor blood flow to the heart by determining the extent of coronary artery disease (CAD). Some patients exercise on a treadmill, which naturally increases the blood flow to your heart, while others who are  unable to walk on a treadmill due to physical limitations  have a pharmacologic/chemical stress agent called Lexiscan . This medicine will mimic walking on a treadmill by temporarily increasing your coronary blood flow.      PLEASE REPORT TO [facility] MEDICAL MALL ENTRANCE   THE  VOLUNTEERS AT THE FIRST DESK WILL DIRECT YOU WHERE TO GO     *Please note: these test may take anywhere between 2-4 hours to complete       Date of Procedure:_____________________________________   Arrival Time for Procedure:______________________________    PLEASE NOTIFY THE OFFICE AT LEAST 24 HOURS IN ADVANCE IF YOU ARE UNABLE TO KEEP YOUR APPOINTMENT.  [place] 24 HOURS IN ADVANCE IF YOU ARE UNABLE TO KEEP YOUR APPOINTMENT. [phone]         How to prepare for your Myoview test:        _XX___:  Hold medications as follows:   hydrochlorothiazide (HYDRODIURIL)     1. Do not eat or drink after midnight  2. No caffeine for 24 hours prior to test  3. No smoking 24 hours prior to test.  4. Unless instructed otherwise, Take your medication with a small sips of water.    5.         Ladies, please do not wear dresses. Skirts or pants are appropriate. Please wear a short sleeve shirt.  6. No perfume, cologne or lotion.  7. Wear comfortable walking shoes. No heels!      Follow-Up: At [facility], you and your health needs are our priority.  As part of our continuing mission to provide you with exceptional heart care, we have created designated Provider Care Teams.  These Care Teams include your primary Cardiologist (physician) and Advanced Practice Providers (APPs -  Physician Assistants and Nurse Practitioners) who all work together to provide you with the care you need, when you need it.  We recommend signing up for the patient portal called "MyChart".  Sign up information is provided on this After Visit Summary.  MyChart is used to connect with patients for Virtual Visits (Telemedicine).  Patients are able  to view lab/test results, encounter notes, upcoming appointments, etc.  Non-urgent messages can be sent to your provider as well.   To learn more about what you can do with MyChart, go to [email or web address].    Your next appointment:   Follow up after testing   The format for your next appointment:   In Person  Provider:   You may see Kate Sable, MD or one of the following Advanced Practice Providers on your designated Care Team:   Murray Hodgkins, NP Christell Faith, PA-C Cadence Kathlen Mody, [place]    Other Instructions    Signed, Kate Sable, MD  03/30/2021 12:37 PM    [place]

## 2021-04-05 ENCOUNTER — Telehealth: Payer: Self-pay | Admitting: Cardiology

## 2021-04-05 ENCOUNTER — Other Ambulatory Visit: Payer: Self-pay

## 2021-04-05 ENCOUNTER — Ambulatory Visit
Admission: RE | Admit: 2021-04-05 | Discharge: 2021-04-05 | Disposition: A | Discharge status: Home / Self Care | Payer: 59 | Source: Ambulatory Visit | Attending: Cardiology | Admitting: Cardiology

## 2021-04-05 DIAGNOSIS — R072 Precordial pain: Secondary | ICD-10-CM

## 2021-04-05 MED ORDER — REGADENOSON 0.4 MG/5ML IV SOLN
0.4000 mg | Freq: Once | INTRAVENOUS | Status: AC
  Administered 2021-04-05: 0.4 mg via INTRAVENOUS
  Filled 2021-04-05: qty 5

## 2021-04-05 MED ORDER — NITROGLYCERIN 0.4 MG SL SUBL
0.4000 mg | SUBLINGUAL_TABLET | SUBLINGUAL | Status: DC | PRN
  Administered 2021-04-05: 0.4 mg via SUBLINGUAL
  Filled 2021-04-05: qty 25

## 2021-04-05 MED ORDER — TECHNETIUM TC 99M TETROFOSMIN IV KIT
10.0000 | PACK | Freq: Once | INTRAVENOUS | Status: AC | PRN
  Administered 2021-04-05: 10.9 via INTRAVENOUS

## 2021-04-05 MED ORDER — TECHNETIUM TC 99M TETROFOSMIN IV KIT
30.0000 | PACK | Freq: Once | INTRAVENOUS | Status: AC | PRN
  Administered 2021-04-05: 32.7 via INTRAVENOUS

## 2021-04-05 NOTE — Telephone Encounter (Signed)
Called patient and left a VM.

## 2021-04-05 NOTE — Telephone Encounter (Signed)
°  Patient calling to discuss recent testing results   Please call asap per patient request as she was having active cp during test and had to take nitro she is very worried

## 2021-04-05 NOTE — Telephone Encounter (Signed)
Pt c/o of Chest Pain: STAT if CP now or developed within 24 hours  1. Are you having CP right now? Yes same pain location as with Alycia Rossetti but an aching now   2. Are you experiencing any other symptoms (ex. SOB, nausea, vomiting, sweating)? No   3. How long have you been experiencing CP? Since reporting to Alycia Rossetti this morning   4. Is your CP continuous or coming and going? Changed to aching pain now   5. Have you taken Nitroglycerin? Yes but that was this morning with provider during test  ?

## 2021-04-06 LAB — NM MYOCAR MULTI W/SPECT W/WALL MOTION / EF
LV dias vol: 78 mL (ref 46–106)
LV sys vol: 30 mL
Nuc Stress EF: 62 %
Peak HR: 83 {beats}/min
Percent HR: 53 %
Rest HR: 62 {beats}/min
Rest Nuclear Isotope Dose: 10.9 mCi
SDS: 0
SRS: 1
SSS: 4
ST Depression (mm): 0 mm
Stress Nuclear Isotope Dose: 32.7 mCi
TID: 1

## 2021-04-08 ENCOUNTER — Encounter: Payer: Self-pay | Admitting: Internal Medicine

## 2021-04-09 ENCOUNTER — Other Ambulatory Visit: Payer: Self-pay

## 2021-04-09 ENCOUNTER — Ambulatory Visit
Admission: RE | Admit: 2021-04-09 | Discharge: 2021-04-09 | Disposition: A | Discharge status: Home / Self Care | Payer: 59 | Source: Ambulatory Visit | Attending: Cardiology | Admitting: Cardiology

## 2021-04-09 ENCOUNTER — Ambulatory Visit: Payer: 59

## 2021-04-09 DIAGNOSIS — E119 Type 2 diabetes mellitus without complications: Secondary | ICD-10-CM | POA: Insufficient documentation

## 2021-04-09 DIAGNOSIS — R0609 Other forms of dyspnea: Secondary | ICD-10-CM

## 2021-04-09 DIAGNOSIS — R079 Chest pain, unspecified: Secondary | ICD-10-CM | POA: Insufficient documentation

## 2021-04-09 DIAGNOSIS — I10 Essential (primary) hypertension: Secondary | ICD-10-CM | POA: Insufficient documentation

## 2021-04-09 DIAGNOSIS — R072 Precordial pain: Secondary | ICD-10-CM | POA: Diagnosis not present

## 2021-04-09 LAB — ECHOCARDIOGRAM COMPLETE
AR max vel: 3.12 cm2
AV Area VTI: 3.7 cm2
AV Area mean vel: 3.39 cm2
AV Mean grad: 3 mmHg
AV Peak grad: 6.2 mmHg
Ao pk vel: 1.24 m/s
Area-P 1/2: 2.53 cm2
MV VTI: 3.24 cm2
S' Lateral: 2.3 cm

## 2021-04-09 NOTE — Telephone Encounter (Signed)
Needs an appt

## 2021-04-09 NOTE — Telephone Encounter (Signed)
Pt is scheduled for Jan 4th, pt is asking if you could call in a small supply to last her until then.

## 2021-04-09 NOTE — Telephone Encounter (Signed)
Patient made aware of results and verbalized understanding.  Patient has appt on 04/25/21

## 2021-04-09 NOTE — Progress Notes (Signed)
*  PRELIMINARY RESULTS* Echocardiogram 2D Echocardiogram has been performed.  Cristela Blue 04/09/2021, 9:37 AM

## 2021-04-18 ENCOUNTER — Telehealth: Payer: Self-pay | Admitting: Cardiology

## 2021-04-18 NOTE — Telephone Encounter (Signed)
Patient calling to check the status of echo and stress test results

## 2021-04-18 NOTE — Telephone Encounter (Signed)
Called patient back and gave her both her Echo and Myoview results. Patient was grateful for the follow up.

## 2021-04-25 ENCOUNTER — Encounter: Payer: Self-pay | Admitting: Internal Medicine

## 2021-04-25 ENCOUNTER — Telehealth (INDEPENDENT_AMBULATORY_CARE_PROVIDER_SITE_OTHER): Payer: 59 | Admitting: Internal Medicine

## 2021-04-25 DIAGNOSIS — F419 Anxiety disorder, unspecified: Secondary | ICD-10-CM | POA: Diagnosis not present

## 2021-04-25 MED ORDER — SEMAGLUTIDE (2 MG/DOSE) 8 MG/3ML ~~LOC~~ SOPN: 2.0000 mg | PEN_INJECTOR | SUBCUTANEOUS | 5 refills | Status: DC

## 2021-04-25 MED ORDER — LORAZEPAM 0.5 MG PO TABS: 0.5000 mg | ORAL_TABLET | ORAL | 0 refills | Status: DC | PRN

## 2021-04-25 NOTE — Progress Notes (Signed)
There were no vitals taken for this visit.   Subjective:    Patient ID: [name], female    DOB: 09-07-1955, 66 y.o.   MRN: [id number]  No chief complaint on file.   HPI: [name] is a 66 y.o. female   This visit was completed via telephone due to the restrictions of the COVID-19 pandemic. All issues as above were discussed and addressed but no physical exam was performed. If it was felt that the patient should be evaluated in the office, they were directed there. The patient verbally consented to this visit. Patient was unable to complete an audio/visual visit due to Technical difficulties. Due to the catastrophic nature of the COVID-19 pandemic, this visit was done through audio contact only. Location of the patient: home Location of the provider: work Those involved with this call:  Provider: Charlynne Cousins, MD CMA: Frazier Butt, [place] Desk/Registration: [employer]  Time spent on call: 10 minutes on the phone discussing health concerns. 10 minutes total spent in review of patient's record and preparation of their chart.    Anxiety Presents for initial (has an alcoholic son and sometimes she says she needs once every two weeks she thinks she needs. had a rx for such and this ran out.) visit. Onset was 1 to 5 years ago (has had a prn rx for 3-4 years - has been using this prn once a month or so per pt. rx ran out and she didnt use it at all.). The problem has been waxing and waning. Symptoms include nervous/anxious behavior. Patient reports no chest pain, feeling of choking, irritability, malaise or muscle tension. Primary symptoms comment: she feels anxious and starts having palpitations to calm her down and to help her handle her son. she feels she can respond better.     No chief complaint on file.   Relevant past medical, surgical, family and social history reviewed and updated as indicated. Interim medical history since our last visit  reviewed. Allergies and medications reviewed and updated.  Review of Systems  Constitutional:  Negative for irritability.  Cardiovascular:  Negative for chest pain.  Psychiatric/Behavioral:  The patient is nervous/anxious.    Per HPI unless specifically indicated above     Objective:    There were no vitals taken for this visit.  Wt Readings from Last 3 Encounters:  03/30/21 181 lb 3.2 oz (82.2 kg)  03/06/21 186 lb 12.8 oz (84.7 kg)  02/13/21 192 lb 9.6 oz (87.4 kg)    Physical Exam Unable to peform sec to virtual visit.   Results for orders placed or performed during the hospital encounter of 04/09/21  ECHOCARDIOGRAM COMPLETE  Result Value Ref Range   Ao pk vel 1.24 m/s   AV Area VTI 3.70 cm2   AR max vel 3.12 cm2   AV Mean grad 3.0 mmHg   AV Peak grad 6.2 mmHg   S' Lateral 2.30 cm   AV Area mean vel 3.39 cm2   Area-P 1/2 2.53 cm2   MV VTI 3.24 cm2        Current Outpatient Medications:    LORazepam (ATIVAN) 0.5 MG tablet, Take 1 tablet (0.5 mg total) by mouth as needed for anxiety., Disp: 15 tablet, Rfl: 0   albuterol (PROAIR HFA) 108 (90 Base) MCG/ACT inhaler, Inhale 2 puffs into the lungs every 6 (six) hours as needed for wheezing or shortness of breath., Disp: 8.5 g, Rfl: 6   albuterol (PROVENTIL) (2.5 MG/3ML) 0.083%  nebulizer solution, USE 1 VIAL VIA NEBULIZER 1 TO 2 TIMES A DAY AS DIRECTED, Disp: 150 mL, Rfl: 1   buPROPion (WELLBUTRIN SR) 150 MG 12 hr tablet, Take 1 tablet (150 mg total) by mouth 3 (three) times daily., Disp: 270 tablet, Rfl: 1   diphenoxylate-atropine (LOMOTIL) 2.5-0.025 MG tablet, diphenoxylate-atropine 2.5 mg-0.025 mg tablet, Disp: , Rfl:    fluticasone (FLONASE) 50 MCG/ACT nasal spray, Place 2 sprays into both nostrils 2 (two) times daily., Disp: 54 mL, Rfl: 4   Fluticasone-Umeclidin-Vilant (TRELEGY ELLIPTA) 100-62.5-25 MCG/INH AEPB, Inhale 1 puff into the lungs daily., Disp: 3 each, Rfl: 4   hydrochlorothiazide (HYDRODIURIL) 50 MG tablet,  Take 1 tablet (50 mg total) by mouth daily., Disp: 90 tablet, Rfl: 1   hyoscyamine (LEVSIN SL) 0.125 MG SL tablet, TAKE ONE TABLET BY MOUTH EVERY 4 HOURS AS NEEDED, Disp: 540 tablet, Rfl: 1   meloxicam (MOBIC) 15 MG tablet, TAKE 1 TABLET BY MOUTH ONCE DAILY, Disp: 90 tablet, Rfl: 1   methocarbamol (ROBAXIN) 500 MG tablet, methocarbamol 500 mg tablet  TAKE 1 TABLET BY MOUTH EVERY 6 HOURS AS NEEDED FOR MUSCLE SPASMS, Disp: 20 tablet, Rfl: 1   montelukast (SINGULAIR) 10 MG tablet, Take 1 tablet (10 mg total) by mouth at bedtime., Disp: 90 tablet, Rfl: 1   omeprazole (PRILOSEC) 40 MG capsule, Take 1 capsule (40 mg total) by mouth daily., Disp: 90 capsule, Rfl: 1   ondansetron (ZOFRAN) 8 MG tablet, Take 1 tablet (8 mg total) by mouth every 8 (eight) hours as needed for nausea or vomiting., Disp: 30 tablet, Rfl: 0   oxybutynin (DITROPAN XL) 10 MG 24 hr tablet, Take 1 tablet (10 mg total) by mouth at bedtime., Disp: 90 tablet, Rfl: 1   predniSONE (DELTASONE) 10 MG tablet, Take 4 tablets (40 mg total) by mouth daily with breakfast. 10 days, Disp: 14 tablet, Rfl: 1   predniSONE (DELTASONE) 20 MG tablet, Take 20 mg by mouth 2 (two) times daily., Disp: , Rfl:    rosuvastatin (CRESTOR) 10 MG tablet, Take 1 tablet (10 mg total) by mouth once a week., Disp: 30 tablet, Rfl: 3   Semaglutide, 2 MG/DOSE, 8 MG/3ML SOPN, Inject 2 mg as directed once a week., Disp: 3 mL, Rfl: 5   Vitamin D, Ergocalciferol, (DRISDOL) 1.25 MG (50000 UNIT) CAPS capsule, TAKE 1 CAPSULE EVERY 7 DAYS, Disp: 12 capsule, Rfl: 0    Assessment & Plan:  Obesity :  Has been on ozempic x 3 months and has dropped weight now is at 179 lbs today per her verbal record.  Anxiety : will start pt on lorazepam 0.5 mg prn uses this once a month # 15 sent in to the pharmacy Pt has been on this for 4-5 years.   Problem List Items Addressed This Visit   None    No orders of the defined types were placed in this encounter.    Meds ordered this  encounter  Medications   LORazepam (ATIVAN) 0.5 MG tablet    Sig: Take 1 tablet (0.5 mg total) by mouth as needed for anxiety.    Dispense:  15 tablet    Refill:  0   Semaglutide, 2 MG/DOSE, 8 MG/3ML SOPN    Sig: Inject 2 mg as directed once a week.    Dispense:  3 mL    Refill:  5     Follow up plan: Return in about 6 weeks (around 06/06/2021).

## 2021-05-14 ENCOUNTER — Other Ambulatory Visit: Payer: Self-pay | Admitting: Family Medicine

## 2021-05-15 NOTE — Telephone Encounter (Signed)
Requested medications are due for refill today.  yes  Requested medications are on the active medications list.  yes  Last refill. 08/29/2020  Future visit scheduled.   no  Notes to clinic.  Failed protocol d/t abnormal labs.    Requested Prescriptions  Pending Prescriptions Disp Refills   hydrochlorothiazide (HYDRODIURIL) 50 MG tablet [Pharmacy Med Name: HYDROCHLOROTHIAZIDE 50 MG TAB] 90 tablet 1    Sig: TAKE 1 TABLET BY MOUTH ONCE DAILY     Cardiovascular: Diuretics - Thiazide Failed - 05/14/2021  5:20 PM      Failed - K in normal range and within 360 days    Potassium  Date Value Ref Range Status  01/17/2021 3.4 (L) 3.5 - 5.2 mmol/L Final  08/30/2013 3.6 3.5 - 5.1 mmol/L Final          Failed - Last BP in normal range    BP Readings from Last 1 Encounters:  03/30/21 140/74          Passed - Ca in normal range and within 360 days    Calcium  Date Value Ref Range Status  01/17/2021 9.0 8.7 - 10.3 mg/dL Final   Calcium, Total  Date Value Ref Range Status  08/30/2013 9.1 8.5 - 10.1 mg/dL Final          Passed - Cr in normal range and within 360 days    Creatinine  Date Value Ref Range Status  08/30/2013 0.64 0.60 - 1.30 mg/dL Final   Creatinine, Ser  Date Value Ref Range Status  01/17/2021 0.77 0.57 - 1.00 mg/dL Final          Passed - Na in normal range and within 360 days    Sodium  Date Value Ref Range Status  01/17/2021 142 134 - 144 mmol/L Final  08/30/2013 138 136 - 145 mmol/L Final          Passed - Valid encounter within last 6 months    Recent Outpatient Visits           2 weeks ago Morbid obesity ([place])   [facility] Vigg, Avanti, MD   3 months ago Morbid obesity ([place])   [facility] Vigg, Avanti, MD   3 months ago SOB (shortness of breath)   [facility] Vigg, Avanti, MD   4 months ago SOB (shortness of breath)   [place], Lauren A, NP   5 months ago SOB (shortness of breath)    [place], Avanti, MD       Future Appointments             In 3 days Agbor-Etang, Aaron Edelman, MD [id number]

## 2021-05-18 ENCOUNTER — Encounter: Payer: Self-pay | Admitting: Cardiology

## 2021-05-18 ENCOUNTER — Ambulatory Visit: Payer: 59 | Admitting: Cardiology

## 2021-05-18 ENCOUNTER — Other Ambulatory Visit: Payer: Self-pay

## 2021-05-18 VITALS — BP 124/68 | HR 81 | Ht 59.0 in | Wt 178.0 lb

## 2021-05-18 DIAGNOSIS — R0609 Other forms of dyspnea: Secondary | ICD-10-CM | POA: Diagnosis not present

## 2021-05-18 DIAGNOSIS — I10 Essential (primary) hypertension: Secondary | ICD-10-CM | POA: Diagnosis not present

## 2021-05-18 DIAGNOSIS — R072 Precordial pain: Secondary | ICD-10-CM | POA: Diagnosis not present

## 2021-05-18 DIAGNOSIS — Z6835 Body mass index (BMI) 35.0-35.9, adult: Secondary | ICD-10-CM | POA: Diagnosis not present

## 2021-05-18 NOTE — Patient Instructions (Signed)

## 2021-05-18 NOTE — Progress Notes (Signed)
Cardiology Office Note:    Date:  05/18/2021   ID:  [name], DOB 1955-09-30, MRN [id number]  PCP:  Charlynne Cousins, MD   [facility] Providers Cardiologist:  None     Referring MD: Charlynne Cousins, MD   Chief Complaint  Patient presents with   OTher    Follow up post testing. Meds reviewed verbally with patient.      History of Present Illness:    [name] is a 66 y.o. female with a hx of hypertension, hyperlipidemia, obesity who presents for follow-up.  She was previously seen due to shortness of breath and chest pain.  Cardiac work-up with echocardiogram and Lexi scan Myoview was performed.  She feels okay, trying to exercise and eat healthier.  She has lost some weight since the last visit.  States feeling much better with weight loss.  Has some chest tenderness with palpation.  She otherwise feels well, has no concerns at this time.    Prior notes Her father passed from an MI in his 24s.   Past Medical History:  Diagnosis Date   Asthma    Depression    Diabetes mellitus without complication ([place])    Hyperlipidemia    Kidney stones    Migraine headache     Past Surgical History:  Procedure Laterality Date   ABDOMINAL HYSTERECTOMY     BREAST CYST ASPIRATION Right 1981   benign   BREAST CYST ASPIRATION Right 1984   benign   CHOLECYSTECTOMY     COLONOSCOPY  March 2016   FLEXIBLE BRONCHOSCOPY N/A 01/26/2018   Procedure: FLEXIBLE BRONCHOSCOPY;  Surgeon: Laverle Hobby, MD;  Location: [facility] ORS;  Service: Pulmonary;  Laterality: N/A;   MENISCUS REPAIR Left 09/18/2018   OOPHORECTOMY     PARTIAL KNEE ARTHROPLASTY Left 02/2019    Current Medications: Current Meds  Medication Sig   albuterol (PROAIR HFA) 108 (90 Base) MCG/ACT inhaler Inhale 2 puffs into the lungs every 6 (six) hours as needed for wheezing or shortness of breath.   albuterol (PROVENTIL) (2.5 MG/3ML) 0.083% nebulizer solution USE 1 VIAL VIA NEBULIZER 1 TO 2 TIMES A DAY AS  DIRECTED   buPROPion (WELLBUTRIN SR) 150 MG 12 hr tablet Take 1 tablet (150 mg total) by mouth 3 (three) times daily.   diphenoxylate-atropine (LOMOTIL) 2.5-0.025 MG tablet diphenoxylate-atropine 2.5 mg-0.025 mg tablet   fluticasone (FLONASE) 50 MCG/ACT nasal spray Place 2 sprays into both nostrils 2 (two) times daily.   Fluticasone-Umeclidin-Vilant (TRELEGY ELLIPTA) 100-62.5-25 MCG/INH AEPB Inhale 1 puff into the lungs daily.   hydrochlorothiazide (HYDRODIURIL) 50 MG tablet TAKE 1 TABLET BY MOUTH ONCE DAILY   hyoscyamine (LEVSIN SL) 0.125 MG SL tablet TAKE ONE TABLET BY MOUTH EVERY 4 HOURS AS NEEDED   LORazepam (ATIVAN) 0.5 MG tablet Take 1 tablet (0.5 mg total) by mouth as needed for anxiety.   meloxicam (MOBIC) 15 MG tablet TAKE 1 TABLET BY MOUTH ONCE DAILY   methocarbamol (ROBAXIN) 500 MG tablet methocarbamol 500 mg tablet  TAKE 1 TABLET BY MOUTH EVERY 6 HOURS AS NEEDED FOR MUSCLE SPASMS   montelukast (SINGULAIR) 10 MG tablet Take 1 tablet (10 mg total) by mouth at bedtime.   omeprazole (PRILOSEC) 40 MG capsule Take 1 capsule (40 mg total) by mouth daily.   ondansetron (ZOFRAN) 8 MG tablet Take 1 tablet (8 mg total) by mouth every 8 (eight) hours as needed for nausea or vomiting.   Semaglutide, 2 MG/DOSE, 8 MG/3ML SOPN Inject 2 mg as directed  once a week.   Vitamin D, Ergocalciferol, (DRISDOL) 1.25 MG (50000 UNIT) CAPS capsule TAKE 1 CAPSULE EVERY 7 DAYS     Allergies:   Cymbalta [duloxetine hcl], Amoxicillin, and Atorvastatin   Social History   Socioeconomic History   Marital status: Single    Spouse name: Not on file   Number of children: Not on file   Years of education: Not on file   Highest education level: Not on file  Occupational History   Not on file  Tobacco Use   Smoking status: Never   Smokeless tobacco: Never  Vaping Use   Vaping Use: Never used  Substance and Sexual Activity   Alcohol use: Yes    Comment: social   Drug use: No   Sexual activity: Not on file   Other Topics Concern   Not on file  Social History Narrative   Not on file   Social Determinants of Health   Financial Resource Strain: Not on file  Food Insecurity: Not on file  Transportation Needs: Not on file  Physical Activity: Not on file  Stress: Not on file  Social Connections: Not on file     Family History: The patient's family history includes Alzheimer's disease in her mother; Arthritis in her mother; Breast cancer in her maternal aunt; Breast cancer (age of onset: 66) in her cousin; Breast cancer (age of onset: 41) in her mother; Breast cancer (age of onset: 94) in her maternal grandmother; Cancer in her mother; Diabetes in her father; Hypertension in her father and mother; Kidney cancer in her father; Parkinson's disease in her mother.  ROS:   Please see the history of present illness.     All other systems reviewed and are negative.  EKGs/Labs/Other Studies Reviewed:    The following studies were reviewed today:   EKG:  EKG not ordered today.   Recent Labs: 08/29/2020: Hemoglobin 14.1; Platelets 270 01/17/2021: ALT 20; BUN 15; Creatinine, Ser 0.77; Potassium 3.4; Sodium 142  Recent Lipid Panel    Component Value Date/Time   CHOL 195 08/29/2020 1431   CHOL 186 04/07/2018 1137   TRIG 176 (H) 08/29/2020 1431   TRIG 110 04/07/2018 1137   HDL 66 08/29/2020 1431   CHOLHDL 2.4 05/02/2016 1531   VLDL 22 04/07/2018 1137   LDLCALC 99 08/29/2020 1431     Risk Assessment/Calculations:          Physical Exam:    VS:  BP 124/68 (BP Location: Left Arm, Patient Position: Sitting, Cuff Size: Normal)    Pulse 81    Ht _0  (1.499 m)    Wt 178 lb (80.7 kg)    SpO2 97%    BMI 35.95 kg/m     Wt Readings from Last 3 Encounters:  05/18/21 178 lb (80.7 kg)  03/30/21 181 lb 3.2 oz (82.2 kg)  03/06/21 186 lb 12.8 oz (84.7 kg)     GEN:  Well nourished, well developed in no acute distress HEENT: Normal NECK: No JVD; No carotid bruits CARDIAC: RRR, no murmurs,  rubs, gallops RESPIRATORY: Diminished breath sounds, clear lungs no wheezing. ABDOMEN: Soft, non-tender, non-distended MUSCULOSKELETAL:  No edema; left chest tenderness with palpation. SKIN: Warm and dry NEUROLOGIC:  Alert and oriented x 3 PSYCHIATRIC:  Normal affect   ASSESSMENT:    1. Precordial pain   2. Dyspnea on exertion   3. Primary hypertension   4. BMI 35.0-35.9,adult     PLAN:    In order of problems listed  above:  Chest pain, echo with normal systolic function, EF 60 to 65%.  [place] with no ischemia.  Patient made aware of results.  Etiology appears musculoskeletal.  Trial of NSAIDs advised.  Follow-up with PCP. Dyspnea on exertion, normal EF, no ischemia on Lexiscan.  Etiology likely COPD and obesity/deconditioning. Hypertension, BP  controlled.  Continue current meds. Obesity, lost some weight since last visit.  Patient encouraged on weight loss process.  Advised to continue eating healthy and exercising.  Follow-up as needed      Medication Adjustments/Labs and Tests Ordered: Current medicines are reviewed at length with the patient today.  Concerns regarding medicines are outlined above.  No orders of the defined types were placed in this encounter.   No orders of the defined types were placed in this encounter.    Patient Instructions  Medication Instructions:   Your physician recommends that you continue on your current medications as directed. Please refer to the Current Medication list given to you today.  *If you need a refill on your cardiac medications before your next appointment, please call your pharmacy*   Lab Work: None ordered If you have labs (blood work) drawn today and your tests are completely normal, you will receive your results only by: [place] (if you have MyChart) OR A paper copy in the mail If you have any lab test that is abnormal or we need to change your treatment, we will call you to review the  results.   Testing/Procedures: None ordered   Follow-Up: At [facility], you and your health needs are our priority.  As part of our continuing mission to provide you with exceptional heart care, we have created designated Provider Care Teams.  These Care Teams include your primary Cardiologist (physician) and Advanced Practice Providers (APPs -  Physician Assistants and Nurse Practitioners) who all work together to provide you with the care you need, when you need it.  We recommend signing up for the patient portal called "MyChart".  Sign up information is provided on this After Visit Summary.  MyChart is used to connect with patients for Virtual Visits (Telemedicine).  Patients are able to view lab/test results, encounter notes, upcoming appointments, etc.  Non-urgent messages can be sent to your provider as well.   To learn more about what you can do with MyChart, go to [email or web address].    Your next appointment:   Follow up as needed   The format for your next appointment:   In Person  Provider:   You may see Kate Sable, MD or one of the following Advanced Practice Providers on your designated Care Team:   Murray Hodgkins, NP Christell Faith, PA-C Cadence Kathlen Mody, [place]    Other Instructions     Signed, Kate Sable, MD  05/18/2021 10:59 AM    [place]

## 2021-06-06 ENCOUNTER — Ambulatory Visit: Payer: 59 | Admitting: Internal Medicine

## 2021-06-11 ENCOUNTER — Other Ambulatory Visit: Payer: Self-pay | Admitting: Nurse Practitioner

## 2021-06-13 ENCOUNTER — Encounter: Payer: Self-pay | Admitting: Internal Medicine

## 2021-06-13 ENCOUNTER — Ambulatory Visit (INDEPENDENT_AMBULATORY_CARE_PROVIDER_SITE_OTHER): Payer: 59 | Admitting: Internal Medicine

## 2021-06-13 ENCOUNTER — Other Ambulatory Visit: Payer: Self-pay

## 2021-06-13 VITALS — BP 118/74 | HR 69 | Temp 98.1°F | Ht 59.02 in | Wt 177.4 lb

## 2021-06-13 DIAGNOSIS — I10 Essential (primary) hypertension: Secondary | ICD-10-CM | POA: Diagnosis not present

## 2021-06-13 DIAGNOSIS — E669 Obesity, unspecified: Secondary | ICD-10-CM | POA: Diagnosis not present

## 2021-06-13 DIAGNOSIS — N39498 Other specified urinary incontinence: Secondary | ICD-10-CM

## 2021-06-13 DIAGNOSIS — E782 Mixed hyperlipidemia: Secondary | ICD-10-CM | POA: Diagnosis not present

## 2021-06-13 LAB — URINALYSIS, ROUTINE W REFLEX MICROSCOPIC
Bilirubin, UA: NEGATIVE
Glucose, UA: NEGATIVE
Ketones, UA: NEGATIVE
Leukocytes,UA: NEGATIVE
Nitrite, UA: NEGATIVE
RBC, UA: NEGATIVE
Specific Gravity, UA: 1.02 (ref 1.005–1.030)
Urobilinogen, Ur: 0.2 mg/dL (ref 0.2–1.0)
pH, UA: 7 (ref 5.0–7.5)

## 2021-06-13 LAB — BAYER DCA HB A1C WAIVED: HB A1C (BAYER DCA - WAIVED): 5.2 % (ref 4.8–5.6)

## 2021-06-13 NOTE — Progress Notes (Signed)
BP 118/74    Pulse 69    Temp 98.1 F (36.7 C) (Oral)    Ht 4' 11.02" (1.499 m)    Wt 177 lb 6.4 oz (80.5 kg)    SpO2 98%    BMI 35.81 kg/m    Subjective:    Patient ID: [name], female    DOB: [birth year], 66 y.o.   MRN: [id number]  Chief Complaint  Patient presents with   Anxiety    HPI: [name] is a 66 y.o. female  Weight loss of 20 lbs started with diet changes and exercising regularly @ trainer at a gym and cardio elleptical 40 mins a day Seen cards since last time , had a echo and stress test.   Pharmacological myocardial perfusion imaging study with no significant  ischemia Normal wall motion, EF estimated at 93% No EKG changes concerning for ischemia at peak stress or in recovery. CT attenuation correction images with very mild atherosclerosis in the aortic arch, no significant coronary calcification Low risk scan    Anxiety Presents for follow-up (son is alcoholic and uses ativan for anxiety) visit. Patient reports no chest pain, compulsions, feeling of choking, irritability, malaise or muscle tension.     Chief Complaint  Patient presents with   Anxiety    Relevant past medical, surgical, family and social history reviewed and updated as indicated. Interim medical history since our last visit reviewed. Allergies and medications reviewed and updated.  Review of Systems  Constitutional:  Negative for irritability.  Cardiovascular:  Negative for chest pain.   Per HPI unless specifically indicated above     Objective:    BP 118/74    Pulse 69    Temp 98.1 F (36.7 C) (Oral)    Ht 4' 11.02" (1.499 m)    Wt 177 lb 6.4 oz (80.5 kg)    SpO2 98%    BMI 35.81 kg/m   Wt Readings from Last 3 Encounters:  06/13/21 177 lb 6.4 oz (80.5 kg)  05/18/21 178 lb (80.7 kg)  03/30/21 181 lb 3.2 oz (82.2 kg)    Physical Exam Vitals and nursing note reviewed.  Constitutional:      General: She is not in acute distress.    Appearance: Normal  appearance. She is not ill-appearing or diaphoretic.  Eyes:     Conjunctiva/sclera: Conjunctivae normal.  Cardiovascular:     Rate and Rhythm: Normal rate and regular rhythm.     Heart sounds: No murmur heard. Pulmonary:     Effort: No respiratory distress.     Breath sounds: No stridor. No wheezing or rhonchi.  Abdominal:     General: Abdomen is flat. Bowel sounds are normal. There is no distension.     Palpations: Abdomen is soft. There is no mass.     Tenderness: There is no abdominal tenderness. There is no guarding.  Skin:    General: Skin is warm and dry.     Coloration: Skin is not jaundiced.     Findings: No bruising or erythema.  Neurological:     General: No focal deficit present.     Mental Status: She is alert.     Cranial Nerves: No cranial nerve deficit.     Sensory: No sensory deficit.  Psychiatric:        Judgment: Judgment normal.   Results for orders placed or performed during the hospital encounter of 04/09/21  ECHOCARDIOGRAM COMPLETE  Result Value Ref Range   Ao pk vel  1.24 m/s   AV Area VTI 3.70 cm2   AR max vel 3.12 cm2   AV Mean grad 3.0 mmHg   AV Peak grad 6.2 mmHg   S' Lateral 2.30 cm   AV Area mean vel 3.39 cm2   Area-P 1/2 2.53 cm2   MV VTI 3.24 cm2        Current Outpatient Medications:    albuterol (PROAIR HFA) 108 (90 Base) MCG/ACT inhaler, Inhale 2 puffs into the lungs every 6 (six) hours as needed for wheezing or shortness of breath., Disp: 8.5 g, Rfl: 6   albuterol (PROVENTIL) (2.5 MG/3ML) 0.083% nebulizer solution, USE 1 VIAL VIA NEBULIZER 1 TO 2 TIMES A DAY AS DIRECTED, Disp: 150 mL, Rfl: 1   buPROPion (WELLBUTRIN SR) 150 MG 12 hr tablet, Take 1 tablet (150 mg total) by mouth 3 (three) times daily., Disp: 270 tablet, Rfl: 1   diphenoxylate-atropine (LOMOTIL) 2.5-0.025 MG tablet, diphenoxylate-atropine 2.5 mg-0.025 mg tablet, Disp: , Rfl:    fluticasone (FLONASE) 50 MCG/ACT nasal spray, Place 2 sprays into both nostrils 2 (two)  times daily., Disp: 54 mL, Rfl: 4   Fluticasone-Umeclidin-Vilant (TRELEGY ELLIPTA) 100-62.5-25 MCG/INH AEPB, Inhale 1 puff into the lungs daily., Disp: 3 each, Rfl: 4   hydrochlorothiazide (HYDRODIURIL) 50 MG tablet, TAKE 1 TABLET BY MOUTH ONCE DAILY, Disp: 90 tablet, Rfl: 1   hyoscyamine (LEVSIN SL) 0.125 MG SL tablet, TAKE ONE TABLET BY MOUTH EVERY 4 HOURS AS NEEDED, Disp: 540 tablet, Rfl: 1   LORazepam (ATIVAN) 0.5 MG tablet, Take 1 tablet (0.5 mg total) by mouth as needed for anxiety., Disp: 15 tablet, Rfl: 0   meloxicam (MOBIC) 15 MG tablet, TAKE 1 TABLET BY MOUTH ONCE DAILY, Disp: 90 tablet, Rfl: 1   methocarbamol (ROBAXIN) 500 MG tablet, methocarbamol 500 mg tablet  TAKE 1 TABLET BY MOUTH EVERY 6 HOURS AS NEEDED FOR MUSCLE SPASMS, Disp: 20 tablet, Rfl: 1   montelukast (SINGULAIR) 10 MG tablet, TAKE 1 TABLET BY MOUTH AT BEDTIME., Disp: 90 tablet, Rfl: 1   omeprazole (PRILOSEC) 40 MG capsule, Take 1 capsule (40 mg total) by mouth daily., Disp: 90 capsule, Rfl: 1   ondansetron (ZOFRAN) 8 MG tablet, Take 1 tablet (8 mg total) by mouth every 8 (eight) hours as needed for nausea or vomiting., Disp: 30 tablet, Rfl: 0   Semaglutide, 2 MG/DOSE, 8 MG/3ML SOPN, Inject 2 mg as directed once a week., Disp: 3 mL, Rfl: 5   oxybutynin (DITROPAN XL) 10 MG 24 hr tablet, Take 1 tablet (10 mg total) by mouth at bedtime. (Patient not taking: Reported on 05/18/2021), Disp: 90 tablet, Rfl: 1   Vitamin D, Ergocalciferol, (DRISDOL) 1.25 MG (50000 UNIT) CAPS capsule, TAKE 1 CAPSULE EVERY 7 DAYS (Patient not taking: Reported on 06/13/2021), Disp: 12 capsule, Rfl: 0    Assessment & Plan:  Obesity : BMI ; lost 20 lbs of weight. Body mass index is 35.81 kg/m.  SOBOE sec to physical deconditioning sec to obesity  Stress test  / lexiscan : wnl.  Problem List Items Addressed This Visit       Cardiovascular and Mediastinum   Essential hypertension - Primary     Other   Hyperlipidemia     No orders of  the defined types were placed in this encounter.    No orders of the defined types were placed in this encounter.    Follow up plan: No follow-ups on file.

## 2021-06-14 LAB — CBC WITH DIFFERENTIAL/PLATELET
Basophils Absolute: 0 10*3/uL (ref 0.0–0.2)
Basos: 1 %
EOS (ABSOLUTE): 0.2 10*3/uL (ref 0.0–0.4)
Eos: 4 %
Hematocrit: 42.7 % (ref 34.0–46.6)
Hemoglobin: 14 g/dL (ref 11.1–15.9)
Immature Grans (Abs): 0 10*3/uL (ref 0.0–0.1)
Immature Granulocytes: 0 %
Lymphocytes Absolute: 1.9 10*3/uL (ref 0.7–3.1)
Lymphs: 34 %
MCH: 31.5 pg (ref 26.6–33.0)
MCHC: 32.8 g/dL (ref 31.5–35.7)
MCV: 96 fL (ref 79–97)
Monocytes Absolute: 0.4 10*3/uL (ref 0.1–0.9)
Monocytes: 7 %
Neutrophils Absolute: 3 10*3/uL (ref 1.4–7.0)
Neutrophils: 54 %
Platelets: 256 10*3/uL (ref 150–450)
RBC: 4.45 x10E6/uL (ref 3.77–5.28)
RDW: 11.9 % (ref 11.7–15.4)
WBC: 5.5 10*3/uL (ref 3.4–10.8)

## 2021-06-14 LAB — TSH: TSH: 2.21 u[IU]/mL (ref 0.450–4.500)

## 2021-06-14 LAB — COMPREHENSIVE METABOLIC PANEL
ALT: 18 IU/L (ref 0–32)
AST: 14 IU/L (ref 0–40)
Albumin/Globulin Ratio: 2.4 — ABNORMAL HIGH (ref 1.2–2.2)
Albumin: 4.4 g/dL (ref 3.8–4.8)
Alkaline Phosphatase: 89 IU/L (ref 44–121)
BUN/Creatinine Ratio: 14 (ref 12–28)
BUN: 10 mg/dL (ref 8–27)
Bilirubin Total: 0.4 mg/dL (ref 0.0–1.2)
CO2: 26 mmol/L (ref 20–29)
Calcium: 9.4 mg/dL (ref 8.7–10.3)
Chloride: 97 mmol/L (ref 96–106)
Creatinine, Ser: 0.71 mg/dL (ref 0.57–1.00)
Globulin, Total: 1.8 g/dL (ref 1.5–4.5)
Glucose: 100 mg/dL — ABNORMAL HIGH (ref 70–99)
Potassium: 3.8 mmol/L (ref 3.5–5.2)
Sodium: 137 mmol/L (ref 134–144)
Total Protein: 6.2 g/dL (ref 6.0–8.5)
eGFR: 94 mL/min/{1.73_m2} (ref >59)

## 2021-07-23 ENCOUNTER — Ambulatory Visit: Payer: 59 | Admitting: Urology

## 2021-07-23 ENCOUNTER — Encounter: Payer: Self-pay | Admitting: Urology

## 2021-08-13 ENCOUNTER — Other Ambulatory Visit: Payer: Self-pay | Admitting: Family Medicine

## 2021-08-15 NOTE — Telephone Encounter (Signed)
Requested medication (s) are due for refill today - no ? ?Requested medication (s) are on the active medication list -no ? ?Future visit scheduled -no ? ?Last refill: unsure ? ?Notes to clinic: Request RF: no longer on current medication list ? ?Requested Prescriptions  ?Pending Prescriptions Disp Refills  ? oxybutynin (DITROPAN-XL) 10 MG 24 hr tablet [Pharmacy Med Name: OXYBUTYNIN CHLORIDE ER 10 MG TAB] 90 tablet 1  ?  Sig: TAKE 1 TABLET BY MOUTH AT BEDTIME  ?  ? Urology:  Bladder Agents Passed - 08/13/2021  4:02 PM  ?  ?  Passed - Valid encounter within last 12 months  ?  Recent Outpatient Visits   ? ?      ? 2 months ago Essential hypertension  ? [facility] Vigg, Avanti, MD  ? 3 months ago Morbid obesity ([place])  ? [facility] Vigg, Avanti, MD  ? 6 months ago Morbid obesity ([place])  ? [place], MD  ? 7 months ago SOB (shortness of breath)  ? [place], MD  ? 7 months ago SOB (shortness of breath)  ? [place] McElwee, Lauren A, NP  ? ?  ?  ? ? ?  ?  ?  ? ? ? ?Requested Prescriptions  ?Pending Prescriptions Disp Refills  ? oxybutynin (DITROPAN-XL) 10 MG 24 hr tablet [Pharmacy Med Name: OXYBUTYNIN CHLORIDE ER 10 MG TAB] 90 tablet 1  ?  Sig: TAKE 1 TABLET BY MOUTH AT BEDTIME  ?  ? Urology:  Bladder Agents Passed - 08/13/2021  4:02 PM  ?  ?  Passed - Valid encounter within last 12 months  ?  Recent Outpatient Visits   ? ?      ? 2 months ago Essential hypertension  ? [facility] Vigg, Avanti, MD  ? 3 months ago Morbid obesity ([place])  ? [facility] Vigg, Avanti, MD  ? 6 months ago Morbid obesity ([place])  ? [place], MD  ? 7 months ago SOB (shortness of breath)  ? [place], MD  ? 7 months ago SOB (shortness of breath)  ? [place] McElwee, Lauren A, NP  ? ?  ?  ? ? ?  ?  ?  ? ? ? ?

## 2021-09-10 ENCOUNTER — Other Ambulatory Visit: Payer: Self-pay | Admitting: Family Medicine

## 2021-09-10 ENCOUNTER — Other Ambulatory Visit: Payer: Self-pay | Admitting: Internal Medicine

## 2021-09-10 DIAGNOSIS — F3342 Major depressive disorder, recurrent, in full remission: Secondary | ICD-10-CM

## 2021-09-11 NOTE — Telephone Encounter (Signed)
Requested Prescriptions  Pending Prescriptions Disp Refills  . omeprazole (PRILOSEC) 40 MG capsule [Pharmacy Med Name: OMEPRAZOLE DR 40 MG CAP] 90 capsule 2    Sig: TAKE 1 CAPSULE EVERY DAY     Gastroenterology: Proton Pump Inhibitors Passed - 09/10/2021  4:06 PM      Passed - Valid encounter within last 12 months    Recent Outpatient Visits          3 months ago Essential hypertension   [place] Vigg, Avanti, MD   4 months ago Morbid obesity ([place])   [facility] Vigg, Avanti, MD   7 months ago Morbid obesity ([place])   [facility] Vigg, Avanti, MD   7 months ago SOB (shortness of breath)   [facility] Vigg, Avanti, MD   8 months ago SOB (shortness of breath)   [place], Lauren A, NP             . meloxicam (MOBIC) 15 MG tablet [Pharmacy Med Name: MELOXICAM 15 MG TAB] 90 tablet 2    Sig: TAKE 1 TABLET BY MOUTH ONCE DAILY     Analgesics:  COX2 Inhibitors Failed - 09/10/2021  4:06 PM      Failed - Manual Review: Labs are only required if the patient has taken medication for more than 8 weeks.      Passed - HGB in normal range and within 360 days    Hemoglobin  Date Value Ref Range Status  06/13/2021 14.0 11.1 - 15.9 g/dL Final         Passed - Cr in normal range and within 360 days    Creatinine  Date Value Ref Range Status  08/30/2013 0.64 0.60 - 1.30 mg/dL Final   Creatinine, Ser  Date Value Ref Range Status  06/13/2021 0.71 0.57 - 1.00 mg/dL Final         Passed - HCT in normal range and within 360 days    Hematocrit  Date Value Ref Range Status  06/13/2021 42.7 34.0 - 46.6 % Final         Passed - AST in normal range and within 360 days    AST  Date Value Ref Range Status  06/13/2021 14 0 - 40 IU/L Final   AST (SGOT) Piccolo, Waived  Date Value Ref Range Status  04/07/2018 16 11 - 38 U/L Final         Passed - ALT in normal range and within 360 days    ALT  Date Value Ref  Range Status  06/13/2021 18 0 - 32 IU/L Final   ALT (SGPT) Piccolo, Waived  Date Value Ref Range Status  04/07/2018 30 10 - 47 U/L Final         Passed - eGFR is 30 or above and within 360 days    EGFR (African American)  Date Value Ref Range Status  08/30/2013 >60  Final   GFR calc Af Amer  Date Value Ref Range Status  02/23/2020 97 >59 mL/min/1.73 Final    Comment:    **In accordance with recommendations from the NKF-ASN Task force,**   [facility] is in the process of updating its eGFR calculation to the   2021 CKD-EPI creatinine equation that estimates kidney function   without a race variable.    EGFR (Non-African Amer.)  Date Value Ref Range Status  08/30/2013 >60  Final    Comment:    eGFR values <59m/min/1.73 m2 may be an  indication of chronic kidney disease (CKD). Calculated eGFR is useful in patients with stable renal function. The eGFR calculation will not be reliable in acutely ill patients when serum creatinine is changing rapidly. It is not useful in  patients on dialysis. The eGFR calculation may not be applicable to patients at the low and high extremes of body sizes, pregnant women, and vegetarians.    GFR calc non Af Amer  Date Value Ref Range Status  02/23/2020 84 >59 mL/min/1.73 Final   eGFR  Date Value Ref Range Status  06/13/2021 94 >59 mL/min/1.73 Final         Passed - Patient is not pregnant      Passed - Valid encounter within last 12 months    Recent Outpatient Visits          3 months ago Essential hypertension   [place] Vigg, Avanti, MD   4 months ago Morbid obesity ([place])   [facility] Vigg, Avanti, MD   7 months ago Morbid obesity ([place])   [facility] Vigg, Avanti, MD   7 months ago SOB (shortness of breath)   [facility] Vigg, Avanti, MD   8 months ago SOB (shortness of breath)   [place], Lauren A, NP             . buPROPion (WELLBUTRIN SR) 150  MG 12 hr tablet [Pharmacy Med Name: BUPROPION HCL ER (SR) 150 MG TAB] 270 tablet 0    Sig: TAKE ONE TABLET 3 TIMES DAILY     Psychiatry: Antidepressants - bupropion Passed - 09/10/2021  4:06 PM      Passed - Cr in normal range and within 360 days    Creatinine  Date Value Ref Range Status  08/30/2013 0.64 0.60 - 1.30 mg/dL Final   Creatinine, Ser  Date Value Ref Range Status  06/13/2021 0.71 0.57 - 1.00 mg/dL Final         Passed - AST in normal range and within 360 days    AST  Date Value Ref Range Status  06/13/2021 14 0 - 40 IU/L Final   AST (SGOT) Piccolo, Waived  Date Value Ref Range Status  04/07/2018 16 11 - 38 U/L Final         Passed - ALT in normal range and within 360 days    ALT  Date Value Ref Range Status  06/13/2021 18 0 - 32 IU/L Final   ALT (SGPT) Piccolo, Waived  Date Value Ref Range Status  04/07/2018 30 10 - 47 U/L Final         Passed - Completed PHQ-2 or PHQ-9 in the last 360 days      Passed - Last BP in normal range    BP Readings from Last 1 Encounters:  06/13/21 118/74         Passed - Valid encounter within last 6 months    Recent Outpatient Visits          3 months ago Essential hypertension   [place] Vigg, Avanti, MD   4 months ago Morbid obesity ([place])   [facility] Vigg, Avanti, MD   7 months ago Morbid obesity ([place])   [facility] Vigg, Avanti, MD   7 months ago SOB (shortness of breath)   [place] Vigg, Avanti, MD   8 months ago SOB (shortness of breath)   [place] McElwee, Scheryl Darter, NP

## 2021-09-11 NOTE — Telephone Encounter (Signed)
Requested medications are due for refill today.  no  Requested medications are on the active medications list.  no  Last refill. 10/04/2020 #90 1 refill  Future visit scheduled.   no  Notes to clinic.  Medication was discontinued 12/08/2020.    Requested Prescriptions  Pending Prescriptions Disp Refills   oxybutynin (DITROPAN-XL) 10 MG 24 hr tablet [Pharmacy Med Name: OXYBUTYNIN CHLORIDE ER 10 MG TAB] 90 tablet 1    Sig: TAKE 1 TABLET BY MOUTH AT BEDTIME     Urology:  Bladder Agents Passed - 09/10/2021  4:00 PM      Passed - Valid encounter within last 12 months    Recent Outpatient Visits           3 months ago Essential hypertension   [facility] Vigg, Avanti, MD   4 months ago Morbid obesity ([facility])   [facility] Vigg, Avanti, MD   7 months ago Morbid obesity (H[facility])   [facility] Vigg, Avanti, MD   7 months ago SOB (shortness of breath)   [facility] Vigg, Avanti, MD   8 months ago SOB (shortness of breath)   [facility] McElwee, Jake [place], NP

## 2021-10-01 ENCOUNTER — Encounter: Payer: Self-pay | Admitting: Family Medicine

## 2021-10-01 NOTE — Telephone Encounter (Signed)
Pt scheduled  

## 2021-10-01 NOTE — Telephone Encounter (Signed)
appt

## 2021-10-04 ENCOUNTER — Encounter: Payer: Self-pay | Admitting: Family Medicine

## 2021-10-04 ENCOUNTER — Telehealth: Payer: 59 | Admitting: Family Medicine

## 2021-10-04 VITALS — BP 131/72 | Wt 164.0 lb

## 2021-10-04 DIAGNOSIS — E1169 Type 2 diabetes mellitus with other specified complication: Secondary | ICD-10-CM

## 2021-10-04 DIAGNOSIS — E785 Hyperlipidemia, unspecified: Secondary | ICD-10-CM

## 2021-10-04 DIAGNOSIS — I10 Essential (primary) hypertension: Secondary | ICD-10-CM

## 2021-10-04 DIAGNOSIS — J449 Chronic obstructive pulmonary disease, unspecified: Secondary | ICD-10-CM

## 2021-10-04 DIAGNOSIS — F3342 Major depressive disorder, recurrent, in full remission: Secondary | ICD-10-CM

## 2021-10-04 DIAGNOSIS — E782 Mixed hyperlipidemia: Secondary | ICD-10-CM | POA: Diagnosis not present

## 2021-10-04 NOTE — Assessment & Plan Note (Signed)
Under good control on current regimen. Continue current regimen. Continue to monitor. Call with any concerns. Refills given. Will check labs at next visit.

## 2021-10-04 NOTE — Assessment & Plan Note (Addendum)
Under good control on current regimen. Continue current regimen. Continue to monitor. Call with any concerns. Refills given. Will check labs at next visit.   

## 2021-10-04 NOTE — Assessment & Plan Note (Addendum)
Would like to come off HCTZ due to issues with incontinence. Will change to low dose losartan and recheck with labs in the next couple of weeks.

## 2021-10-04 NOTE — Assessment & Plan Note (Signed)
Under good control on current regimen. Continue current regimen. Continue to monitor. Call with any concerns. Refills given.   

## 2021-10-04 NOTE — Progress Notes (Signed)
BP 131/72   Wt 164 lb (74.4 kg)   BMI 33.11 kg/m    Subjective:    Patient ID: [name], female    DOB: October 29, 1955, 67 y.o.   MRN: [id number]  HPI: [name] is a 66 y.o. female  Chief Complaint  Patient presents with  . Hypertension  . Diabetes  . Asthma  . Hyperlipidemia  . Depression   DIABETES Hypoglycemic episodes:no Polydipsia/polyuria: no Visual disturbance: no Chest pain: no Paresthesias: no Glucose Monitoring: no  Accucheck frequency: Not Checking Taking Insulin?: no Blood Pressure Monitoring: not checking Retinal Examination: Not up to Date Foot Exam: Not up to Date Diabetic Education: Completed Pneumovax: Up to Date Influenza: Up to Date Aspirin: no  HYPERTENSION / Virgie Satisfied with current treatment? yes Duration of hypertension: chronic BP monitoring frequency: not checking BP medication side effects: yes- stopped the HCTZ and no longer had any incontinence Past BP meds: hctz Duration of hyperlipidemia: chronic Cholesterol medication side effects: no Cholesterol supplements: none Past cholesterol medications: none Medication compliance: excellent compliance Aspirin: no Recent stressors: no Recurrent headaches: no Visual changes: no Palpitations: no Dyspnea: no Chest pain: no Lower extremity edema: no Dizzy/lightheaded: no  Relevant past medical, surgical, family and social history reviewed and updated as indicated. Interim medical history since our last visit reviewed. Allergies and medications reviewed and updated.  Review of Systems  Constitutional: Negative.   Respiratory: Negative.    Cardiovascular: Negative.   Gastrointestinal: Negative.   Genitourinary: Negative.   Musculoskeletal: Negative.   Neurological: Negative.   Psychiatric/Behavioral: Negative.      Per HPI unless specifically indicated above     Objective:    BP 131/72   Wt 164 lb (74.4 kg)   BMI 33.11 kg/m   Wt Readings from  Last 3 Encounters:  10/04/21 164 lb (74.4 kg)  06/13/21 177 lb 6.4 oz (80.5 kg)  05/18/21 178 lb (80.7 kg)    Physical Exam Vitals and nursing note reviewed.  Constitutional:      General: She is not in acute distress.    Appearance: Normal appearance. She is not ill-appearing, toxic-appearing or diaphoretic.  HENT:     Head: Normocephalic and atraumatic.     Right Ear: External ear normal.     Left Ear: External ear normal.     Nose: Nose normal.     Mouth/Throat:     Mouth: Mucous membranes are moist.     Pharynx: Oropharynx is clear.  Eyes:     General: No scleral icterus.       Right eye: No discharge.        Left eye: No discharge.     Conjunctiva/sclera: Conjunctivae normal.     Pupils: Pupils are equal, round, and reactive to light.  Pulmonary:     Effort: Pulmonary effort is normal. No respiratory distress.     Comments: Speaking in full sentences Musculoskeletal:        General: Normal range of motion.     Cervical back: Normal range of motion.  Skin:    Coloration: Skin is not jaundiced or pale.     Findings: No bruising, erythema, lesion or rash.  Neurological:     Mental Status: She is alert and oriented to person, place, and time. Mental status is at baseline.  Psychiatric:        Mood and Affect: Mood normal.        Behavior: Behavior normal.  Thought Content: Thought content normal.        Judgment: Judgment normal.    Results for orders placed or performed in visit on 06/13/21  CBC with Differential/Platelet  Result Value Ref Range   WBC 5.5 3.4 - 10.8 x10E3/uL   RBC 4.45 3.77 - 5.28 x10E6/uL   Hemoglobin 14.0 11.1 - 15.9 g/dL   Hematocrit 42.7 34.0 - 46.6 %   MCV 96 79 - 97 fL   MCH 31.5 26.6 - 33.0 pg   MCHC 32.8 31.5 - 35.7 g/dL   RDW 11.9 11.7 - 15.4 %   Platelets 256 150 - 450 x10E3/uL   Neutrophils 54 Not Estab. %   Lymphs 34 Not Estab. %   Monocytes 7 Not Estab. %   Eos 4 Not Estab. %   Basos 1 Not Estab. %   Neutrophils Absolute  3.0 1.4 - 7.0 x10E3/uL   Lymphocytes Absolute 1.9 0.7 - 3.1 x10E3/uL   Monocytes Absolute 0.4 0.1 - 0.9 x10E3/uL   EOS (ABSOLUTE) 0.2 0.0 - 0.4 x10E3/uL   Basophils Absolute 0.0 0.0 - 0.2 x10E3/uL   Immature Granulocytes 0 Not Estab. %   Immature Grans (Abs) 0.0 0.0 - 0.1 x10E3/uL  Comprehensive metabolic panel  Result Value Ref Range   Glucose 100 (H) 70 - 99 mg/dL   BUN 10 8 - 27 mg/dL   Creatinine, Ser 0.71 0.57 - 1.00 mg/dL   eGFR 94 >59 mL/min/1.73   BUN/Creatinine Ratio 14 12 - 28   Sodium 137 134 - 144 mmol/L   Potassium 3.8 3.5 - 5.2 mmol/L   Chloride 97 96 - 106 mmol/L   CO2 26 20 - 29 mmol/L   Calcium 9.4 8.7 - 10.3 mg/dL   Total Protein 6.2 6.0 - 8.5 g/dL   Albumin 4.4 3.8 - 4.8 g/dL   Globulin, Total 1.8 1.5 - 4.5 g/dL   Albumin/Globulin Ratio 2.4 (H) 1.2 - 2.2   Bilirubin Total 0.4 0.0 - 1.2 mg/dL   Alkaline Phosphatase 89 44 - 121 IU/L   AST 14 0 - 40 IU/L   ALT 18 0 - 32 IU/L  Urinalysis, Routine w reflex microscopic  Result Value Ref Range   Specific Gravity, UA 1.020 1.005 - 1.030   pH, UA 7.0 5.0 - 7.5   Color, UA Yellow Yellow   Appearance Ur Cloudy (A) Clear   Leukocytes,UA Negative Negative   Protein,UA Trace (A) Negative/Trace   Glucose, UA Negative Negative   Ketones, UA Negative Negative   RBC, UA Negative Negative   Bilirubin, UA Negative Negative   Urobilinogen, Ur 0.2 0.2 - 1.0 mg/dL   Nitrite, UA Negative Negative  TSH  Result Value Ref Range   TSH 2.210 0.450 - 4.500 uIU/mL  Bayer DCA Hb A1c Waived (STAT)  Result Value Ref Range   HB A1C (BAYER DCA - WAIVED) 5.2 4.8 - 5.6 %      Assessment & Plan:   Problem List Items Addressed This Visit       Cardiovascular and Mediastinum   Essential hypertension    Under good control on current regimen. Continue current regimen. Continue to monitor. Call with any concerns. Refills given. Will check labs at next visit.           Respiratory   COPD (chronic obstructive pulmonary disease)  (HCC)    Under good control on current regimen. Continue current regimen. Continue to monitor. Call with any concerns. Refills given.  Endocrine   Diabetes mellitus associated with hormonal etiology ([place])    Under good control on current regimen. Continue current regimen. Continue to monitor. Call with any concerns. Refills given. Will check labs at next visit.        Hyperlipidemia associated with type 2 diabetes mellitus ([place]) - Primary    Under good control on current regimen. Continue current regimen. Continue to monitor. Call with any concerns. Refills given. Will check labs at next visit.          Other   Hyperlipidemia   Recurrent major depressive disorder, in full remission ([place])     Follow up plan: Return August IN PERSON physical.    This visit was completed via video visit through [place] due to the restrictions of the COVID-19 pandemic. All issues as above were discussed and addressed. Physical exam was done as above through visual confirmation on video through MyChart. If it was felt that the patient should be evaluated in the office, they were directed there. The patient verbally consented to this visit. Location of the patient: home Location of the provider: work Those involved with this call:  Provider: Park Liter, DO CMA: Louanna Raw, [place] Desk/Registration: [employer]  Time spent on call:  25 minutes with patient face to face via video conference. More than 50% of this time was spent in counseling and coordination of care. 40 minutes total spent in review of patient's record and preparation of their chart.

## 2021-10-05 MED ORDER — LOSARTAN POTASSIUM 25 MG PO TABS: 25.0000 mg | ORAL_TABLET | Freq: Every day | ORAL | 3 refills | Status: DC

## 2021-10-05 MED ORDER — ALBUTEROL SULFATE HFA 108 (90 BASE) MCG/ACT IN AERS: 2.0000 | INHALATION_SPRAY | Freq: Four times a day (QID) | RESPIRATORY_TRACT | 6 refills | Status: DC | PRN

## 2021-10-05 MED ORDER — LORAZEPAM 0.5 MG PO TABS: 0.5000 mg | ORAL_TABLET | ORAL | 0 refills | Status: DC | PRN

## 2021-10-05 MED ORDER — HYOSCYAMINE SULFATE 0.125 MG SL SUBL: SUBLINGUAL_TABLET | SUBLINGUAL | 1 refills | Status: DC

## 2021-10-05 MED ORDER — FLUTICASONE PROPIONATE 50 MCG/ACT NA SUSP: 2.0000 | Freq: Two times a day (BID) | NASAL | 4 refills | Status: DC

## 2021-10-05 MED ORDER — TRELEGY ELLIPTA 100-62.5-25 MCG/ACT IN AEPB: 1.0000 | INHALATION_SPRAY | Freq: Every day | RESPIRATORY_TRACT | 4 refills | Status: DC

## 2021-10-05 NOTE — Progress Notes (Signed)
Patient scheduled.

## 2021-10-09 ENCOUNTER — Telehealth: Payer: 59 | Admitting: Family Medicine

## 2021-10-10 ENCOUNTER — Ambulatory Visit: Payer: Self-pay | Admitting: *Deleted

## 2021-10-10 ENCOUNTER — Encounter: Payer: Self-pay | Admitting: Family Medicine

## 2021-10-10 NOTE — Telephone Encounter (Signed)
Cut medicine in 1/2. Needs in person appt ASAP

## 2021-10-10 NOTE — Assessment & Plan Note (Addendum)
Under good control on current regimen. Continue current regimen. Continue to monitor. Call with any concerns. Refills up to date. Refill of lorazepam given today. Call with any concerns.

## 2021-10-10 NOTE — Telephone Encounter (Signed)
Routing to provider to advise.  

## 2021-10-10 NOTE — Assessment & Plan Note (Signed)
Under good control on current regimen. Continue current regimen. Continue to monitor. Call with any concerns. Refills up to date. Labs drawn today.  

## 2021-10-10 NOTE — Telephone Encounter (Signed)
Summary: Dizziness and headaches   Patient states she is on new bp meds and is feeling dizzy and having headaches, patient inquiring when she should take the medication. Please reference patient's 6-21 mychart  message   Please follow up w/ patient     Attempted to call patient- left message to call office

## 2021-10-10 NOTE — Telephone Encounter (Signed)
  Chief Complaint: feeling dizzy when stands and headache Symptoms: since started Cozaar Frequency: everytime stands Pertinent Negatives: Patient denies fainting Disposition: [] ED /[] Urgent Care (no appt availability in office) / [] Appointment(In office/virtual)/ []  [facility]/ [] Home Care/ [] Refused Recommended Disposition /[] [facility]/ [x]  Follow-up with PCP Additional Notes: Pt going to buy an At Home BP Machine today. She would like some parameters to know when to take new Bp med. Dizzy when stands and constant headache. Please advise.  Answer Assessment - Initial Assessment Questions 1. BLOOD PRESSURE: "What is the blood pressure?" "Did you take at least two measurements 5 minutes apart?"     Not taken 2. ONSET: "When did you take your blood pressure?"     Have not taken 3. HOW: "How did you obtain the blood pressure?" (e.g., visiting nurse, automatic home BP monitor)     Going to buy a machine 4. HISTORY: "Do you have a history of low blood pressure?" "What is your blood pressure normally?"     On new bp med 5. MEDICATIONS: "Are you taking any medications for blood pressure?" If Yes, ask: "Have they been changed recently?"     Taking new med for bp taking at night 6. PULSE RATE: "Do you know what your pulse rate is?"      unknown  Protocols used: Blood Pressure - Low-A-AH

## 2021-10-16 ENCOUNTER — Ambulatory Visit: Payer: 59 | Admitting: Family Medicine

## 2021-10-16 ENCOUNTER — Encounter: Payer: Self-pay | Admitting: Family Medicine

## 2021-10-16 VITALS — BP 134/82 | HR 60 | Wt 170.4 lb

## 2021-10-16 DIAGNOSIS — E782 Mixed hyperlipidemia: Secondary | ICD-10-CM

## 2021-10-16 DIAGNOSIS — T466X5A Adverse effect of antihyperlipidemic and antiarteriosclerotic drugs, initial encounter: Secondary | ICD-10-CM

## 2021-10-16 DIAGNOSIS — K219 Gastro-esophageal reflux disease without esophagitis: Secondary | ICD-10-CM

## 2021-10-16 DIAGNOSIS — K760 Fatty (change of) liver, not elsewhere classified: Secondary | ICD-10-CM | POA: Diagnosis not present

## 2021-10-16 DIAGNOSIS — E559 Vitamin D deficiency, unspecified: Secondary | ICD-10-CM

## 2021-10-16 DIAGNOSIS — J449 Chronic obstructive pulmonary disease, unspecified: Secondary | ICD-10-CM

## 2021-10-16 DIAGNOSIS — E785 Hyperlipidemia, unspecified: Secondary | ICD-10-CM

## 2021-10-16 DIAGNOSIS — I10 Essential (primary) hypertension: Secondary | ICD-10-CM | POA: Diagnosis not present

## 2021-10-16 DIAGNOSIS — N3946 Mixed incontinence: Secondary | ICD-10-CM

## 2021-10-16 DIAGNOSIS — E1169 Type 2 diabetes mellitus with other specified complication: Secondary | ICD-10-CM

## 2021-10-16 DIAGNOSIS — G720 Drug-induced myopathy: Secondary | ICD-10-CM

## 2021-10-16 LAB — MICROALBUMIN, URINE WAIVED
Creatinine, Urine Waived: 50 mg/dL (ref 10–300)
Microalb, Ur Waived: 10 mg/L (ref 0–19)
Microalb/Creat Ratio: <30 mg/g (ref <30)

## 2021-10-16 LAB — URINALYSIS, ROUTINE W REFLEX MICROSCOPIC
Bilirubin, UA: NEGATIVE
Glucose, UA: NEGATIVE
Ketones, UA: NEGATIVE
Leukocytes,UA: NEGATIVE
Nitrite, UA: NEGATIVE
Protein,UA: NEGATIVE
RBC, UA: NEGATIVE
Specific Gravity, UA: 1.015 (ref 1.005–1.030)
Urobilinogen, Ur: 0.2 mg/dL (ref 0.2–1.0)
pH, UA: 7.5 (ref 5.0–7.5)

## 2021-10-16 LAB — BAYER DCA HB A1C WAIVED: HB A1C (BAYER DCA - WAIVED): 5.2 % (ref 4.8–5.6)

## 2021-10-16 MED ORDER — LOSARTAN POTASSIUM 25 MG PO TABS
12.5000 mg | ORAL_TABLET | Freq: Every day | ORAL | 1 refills | Status: DC
Start: 2021-10-16 — End: 2022-04-17

## 2021-10-16 NOTE — Assessment & Plan Note (Signed)
Under good control on current regimen. Continue current regimen. Continue to monitor. Call with any concerns. Refills given. Labs drawn today.   

## 2021-10-16 NOTE — Assessment & Plan Note (Signed)
Rechecking labs today. Await results. Treat as needed.  °

## 2021-10-16 NOTE — Assessment & Plan Note (Signed)
Unable to tolerate statins. Will recheck labs today. Await results. Treat as needed.

## 2021-10-16 NOTE — Assessment & Plan Note (Signed)
Labs drawn today. Await results. Treat as needed.  

## 2021-10-17 LAB — COMPREHENSIVE METABOLIC PANEL
ALT: 16 IU/L (ref 0–32)
AST: 12 IU/L (ref 0–40)
Albumin/Globulin Ratio: 2.2 (ref 1.2–2.2)
Albumin: 4.7 g/dL (ref 3.8–4.8)
Alkaline Phosphatase: 80 IU/L (ref 44–121)
BUN/Creatinine Ratio: 17 (ref 12–28)
BUN: 13 mg/dL (ref 8–27)
Bilirubin Total: 0.5 mg/dL (ref 0.0–1.2)
CO2: 26 mmol/L (ref 20–29)
Calcium: 9.8 mg/dL (ref 8.7–10.3)
Chloride: 102 mmol/L (ref 96–106)
Creatinine, Ser: 0.78 mg/dL (ref 0.57–1.00)
Globulin, Total: 2.1 g/dL (ref 1.5–4.5)
Glucose: 90 mg/dL (ref 70–99)
Potassium: 4.5 mmol/L (ref 3.5–5.2)
Sodium: 144 mmol/L (ref 134–144)
Total Protein: 6.8 g/dL (ref 6.0–8.5)
eGFR: 84 mL/min/{1.73_m2} (ref >59)

## 2021-10-17 LAB — CBC WITH DIFFERENTIAL/PLATELET
Basophils Absolute: 0 10*3/uL (ref 0.0–0.2)
Basos: 1 %
EOS (ABSOLUTE): 0.2 10*3/uL (ref 0.0–0.4)
Eos: 5 %
Hematocrit: 40.6 % (ref 34.0–46.6)
Hemoglobin: 14 g/dL (ref 11.1–15.9)
Immature Grans (Abs): 0 10*3/uL (ref 0.0–0.1)
Immature Granulocytes: 0 %
Lymphocytes Absolute: 1.7 10*3/uL (ref 0.7–3.1)
Lymphs: 40 %
MCH: 31.3 pg (ref 26.6–33.0)
MCHC: 34.5 g/dL (ref 31.5–35.7)
MCV: 91 fL (ref 79–97)
Monocytes Absolute: 0.3 10*3/uL (ref 0.1–0.9)
Monocytes: 8 %
Neutrophils Absolute: 2 10*3/uL (ref 1.4–7.0)
Neutrophils: 46 %
Platelets: 246 10*3/uL (ref 150–450)
RBC: 4.48 x10E6/uL (ref 3.77–5.28)
RDW: 11.7 % (ref 11.7–15.4)
WBC: 4.2 10*3/uL (ref 3.4–10.8)

## 2021-10-17 LAB — LIPID PANEL W/O CHOL/HDL RATIO
Cholesterol, Total: 202 mg/dL — ABNORMAL HIGH (ref 100–199)
HDL: 71 mg/dL (ref >39)
LDL Chol Calc (NIH): 110 mg/dL — ABNORMAL HIGH (ref 0–99)
Triglycerides: 123 mg/dL (ref 0–149)
VLDL Cholesterol Cal: 21 mg/dL (ref 5–40)

## 2021-10-17 LAB — TSH: TSH: 2.67 u[IU]/mL (ref 0.450–4.500)

## 2021-10-17 LAB — VITAMIN D 25 HYDROXY (VIT D DEFICIENCY, FRACTURES): Vit D, 25-Hydroxy: 33.1 ng/mL (ref 30.0–100.0)

## 2021-11-01 ENCOUNTER — Ambulatory Visit: Payer: 59 | Attending: Family Medicine

## 2021-11-01 DIAGNOSIS — M6289 Other specified disorders of muscle: Secondary | ICD-10-CM | POA: Insufficient documentation

## 2021-11-01 DIAGNOSIS — R278 Other lack of coordination: Secondary | ICD-10-CM | POA: Diagnosis present

## 2021-11-01 DIAGNOSIS — M6281 Muscle weakness (generalized): Secondary | ICD-10-CM | POA: Insufficient documentation

## 2021-11-01 DIAGNOSIS — N3946 Mixed incontinence: Secondary | ICD-10-CM | POA: Insufficient documentation

## 2021-11-01 NOTE — Therapy (Signed)
OUTPATIENT PHYSICAL THERAPY FEMALE PELVIC EVALUATION   Patient Name: [name] MRN: [id number] DOB:1956-02-09, 66 y.o., female Today's Date: 11/01/2021   PT End of Session - 11/01/21 0802     Visit Number 1    Number of Visits 12    Date for PT Re-Evaluation 01/24/22    Authorization Type IE: 11/01/21    PT Start Time 0800    PT Stop Time 0840    PT Time Calculation (min) 40 min    Activity Tolerance Patient tolerated treatment well             Past Medical History:  Diagnosis Date   Asthma    Depression    Diabetes mellitus without complication ([place])    Hyperlipidemia    Kidney stones    Migraine headache    Past Surgical History:  Procedure Laterality Date   ABDOMINAL HYSTERECTOMY     BREAST CYST ASPIRATION Right 1981   benign   BREAST CYST ASPIRATION Right 1984   benign   CHOLECYSTECTOMY     COLONOSCOPY  March 2016   FLEXIBLE BRONCHOSCOPY N/A 01/26/2018   Procedure: FLEXIBLE BRONCHOSCOPY;  Surgeon: Laverle Hobby, MD;  Location: [facility] ORS;  Service: Pulmonary;  Laterality: N/A;   MENISCUS REPAIR Left 09/18/2018   OOPHORECTOMY     PARTIAL KNEE ARTHROPLASTY Left 02/2019   Patient Active Problem List   Diagnosis Date Noted   Statin myopathy 10/16/2021   Obesity (BMI 35.0-39.9 without comorbidity) 06/13/2021   COPD (chronic obstructive pulmonary disease) ([place]) 01/19/2021   Morbid obesity ([place]) 01/19/2021   Gastroesophageal reflux disease without esophagitis 01/19/2021   Absence of bladder continence 11/30/2020   Recurrent major depressive disorder, in full remission ([place]) 11/30/2020   Chronic venous insufficiency 10/05/2020   Pain and swelling of lower leg 09/18/2020   Raynauds phenomenon 09/18/2020   Hyperlipidemia 09/18/2020   COVID-19 04/30/2020   History of total knee arthroplasty 09/08/2019   Vitamin D deficiency 12/03/2018   Knee arthropathy 07/28/2018   Anxiety 04/07/2018   Hyperlipidemia associated with type 2 diabetes mellitus  ([place]) 04/07/2018   Mixed stress and urge urinary incontinence 02/25/2017   Right sided weakness 09/26/2016   Asthma 11/15/2015   Essential hypertension 10/20/2014   Diabetes mellitus associated with hormonal etiology ([place]) 10/20/2014   Celiac sprue 10/20/2014   BMI 40.0-44.9, adult ([place]) 10/20/2014   Fatty infiltration of liver 08/31/2014    PCP: Park Liter, DO  REFERRING PROVIDER: Valerie Roys, DO   REFERRING DIAG:  N39.46 (ICD-10-CM) - Mixed stress and urge urinary incontinence   THERAPY DIAG:  Pelvic floor dysfunction  Other lack of coordination  Muscle weakness (generalized)  Rationale for Evaluation and Treatment: Rehabilitation  ONSET DATE: 5 years ago   RED FLAGS: N/A Have you had any night sweats? Unexplained weight loss? Saddle anesthesia? Unexplained changes in bowel or bladder habits?   SUBJECTIVE: Patient confirms identification and approves PT to assess pelvic floor and treatment Yes  PRECAUTIONS: None  WEIGHT BEARING RESTRICTIONS: No  FALLS:  Has patient fallen in last 6 months? 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 Pt has lost weight but noticed the leakage was heavier with increased weight. Pt has tried to pinpoint when the leakage is worse (with certain food or exercise) but cannot figure it out. Pt was taking one BP medication that encouraged more fluid removal but Dr has since changed it and leakage has been better but is still present and limits her. Pt tries not to lift anything because she knows she will leak. Pt hasn't noticed too much leakage with walking but still is present. Pt has a hx of  intense core exercises. Pt does have COPD and cannot lie flat for very long, Pt sleeps with multiple pillows and feels her COPD is well managed. Walking 7 days a week (2-3 miles), and working with a Physiological scientist 3x per week, a lot of strength training. Has to wear a Level 3 pad with any physical activity.     PAIN:  Are you having pain? No    LIVING ENVIRONMENT: Lives with: lives alone Lives in: House/apartment   PATIENT GOALS: Pt would like to stop the leakage and not wear pads, and strengthen parts of her body that she is not aware of     UROLOGICAL HISTORY Fluid intake: Yes: water all day w/lemon/limes, diet soft drink very occasionally   Pain with urination: No Fully empty bladder: Yes Stream:  sitting/but does stop and go  Urgency: Yes  Frequency: 6-10x/day  Nocturia: 1x Leakage: Walking to the bathroom, Exercise, Lifting, Bending forward, and Intercourse Pads: Yes Type: incontinence pads, #2, when out in the community #3  Amount: 6-8x/day  Bladder control (0-10): 4/10  GASTROINTESTINAL HISTORY Pt has no concerns     SEXUAL HISTORY/FUNCTION  Pain with intercourse: No Able to achieve orgasm?: Yes  OBSTETRICAL HISTORY Vaginal deliveries: G4P4 Tearing: Yes: stitches with all of them    GYNECOLOGICAL HISTORY Hysterectomy: yes, abdominal hysterectomy  Pelvic Organ Prolapse: None Pain with exam: no  Heaviness/pressure: no    OBJECTIVE:    COGNITION: Overall cognitive status: Within functional limits for tasks assessed     POSTURE:  In seated B plantarflexion with B rounded shoulders    Thoracic kyphosis: slightly in standing  Iliac crest height: L iliac crest  Pelvic obliquity: L posteriorly rotated    GAIT:  Trendelenburg: positive on L   SENSATION: Deferred 2/2 time constraints Light touch: , L2-S2 dermatomes  Proprioception:    RANGE OF MOTION:  Deferred the rest 2/2 time constraints  (Norm range in degrees)  LEFT 11/01/21  RIGHT 11/01/21  Lumbar forward flexion (65):  WNL    Lumbar extension (30): WNL    Lumbar lateral flexion (25):  Restricted (above knee) Restricted (above knee)  Thoracic and Lumbar rotation (30 degrees):    WNL WNL  Hip Flexion (0-125):      Hip IR (0-45):     Hip ER (0-45):     Hip Adduction:      Hip Abduction (0-40):     Hip extension (0-15):     (*= pain, Blank rows = not tested)   STRENGTH: MMT  Deferred 2/2 time constraints  RLE  LLE   Hip Flexion    Hip Extension    Hip Abduction  Hip Adduction     Hip ER     Hip IR     Knee Extension    Knee Flexion    Dorsiflexion     Plantarflexion (seated)    (*= pain, Blank rows = not tested)   SPECIAL TESTS: Deferred 2/2 time constraints Centralization and Peripheralization (SN 92, -LR 0.12):  Slump (SN 83, -LR 0.32): R:  L:  SLR (SN 92, -LR 0.29): R:  L:   Lumbar quadrant (SN 70): R:  L:  FABER (SN 81): R:  L:  FADIR (SN 94): R:  L:  Hip scour (SN 50): R:  L:  Thigh Thrust (SN 88, -LR 0.18) : R:  L:  Distraction (QV95): R:  L:  Compression (SN/SP 69): R:  L:  Stork/March (SP 93): R:  L:    PHYSICAL PERFORMANCE MEASURES: Deferred 2/2 time constraints  STS:  RLE SLS:  LLE SLS:  6 MWT:  10MWT:  5TSTS:   PALPATION: Deferred 2/2 time constraints Abdominal:  Diastasis:  finger above umbilicus,  fingers at and below umbilicus  Scar mobility: present/mobile perpendicular, parallel Rib flare: present/absent  EXTERNAL PELVIC EXAM: Patient educated on the purpose of the pelvic exam and articulated understanding; patient consented to the exam verbally. Deferred 2/2 time constraints Palpation: Breath coordination: present/absent/inconsistent Voluntary Contraction: present/absent Relaxation: full/delayed/non-relaxing Perineal movement with sustained IAP increase ("bear down"): descent/no change/elevation/excessive descent Perineal movement with rapid IAP increase ("cough"): elevation/no change/descent Pubic  symphysis: (0= no contraction, 1= flicker, 2= weak squeeze, 3= fair squeeze with lift, 4= good squeeze and lift against resistance, 5= strong squeeze against strong resistance)   INTERNAL PELVIC EXAM: Patient educated on the purpose of the pelvic exam and articulated understanding; patient consented to the exam verbally. Deferred 2/2 to time constraints Introitus Appears:  Skin integrity:  Scar mobility: Strength (PERF):  Symmetry: Palpation: Prolapse: (0= no contraction, 1= flicker, 2= weak squeeze, 3= fair squeeze with lift, 4= good squeeze and lift against resistance, 5= strong squeeze against strong resistance)    Patient Education:  Patient educated on what to expect during course of physical therapy, POC, and provided with HEP including: toileting posture handout. Discussion on length of time to see improvements in strength and at the pelvic floor. Patient verbalized understanding and returned demonstration. Patient will benefit from further education in order to maximize compliance and understanding for long-term therapeutic gains.   Patient Surveys:  FOTO Urinary Problem - 52     ASSESSMENT:  Clinical Impression: Patient is a 66 y.o. who was seen today for physical therapy evaluation and treatment for a chief concern of urinary leakage. PLOF. Today's evaluation suggest deficits in IAP management, PFM strength, PFM coordination, PFM endurance, and posture as evidenced by urinary leakage with physical/activity/bending/lifting/squatting/walking to the bathroom/sexual intercourse, use of incontinence pads (Lvl 2), use of Lvl 3 incontinence pads with exercise, frequency of pad use (6-8x/day), occasional intermittent urine stream, increased rounded shoulders in sitting, and B plantarflexion in sitting (increase PFM tension). Upon brief physical examination Pt demonstrates deficits in ROM and posture as evidenced by increased L iliac crest height, L posteriorly rotated innominate, and  restricted ROM in B lateral flexion (above knee). Patient's responses on FOTO Urinary Problem (50) indicates moderate limitation/disability/distress. Patient's progress may be limited due to time since onset; however, patient's motivation is advantageous. Pt with basic understanding of PFM function in bowel/bladder habits, the deep core, posture, and sexual function. Patient will benefit from skilled therapeutic intervention to address deficits in IAP  management, PFM strength, PFM coordination, PFM endurance, ROM and posture in order to increase PLOF and improve overall QOL.    Objective Impairments: decreased activity tolerance, decreased coordination, decreased endurance, decreased ROM, decreased strength, improper body mechanics, and postural dysfunction.   Activity Limitations: carrying, lifting, bending, squatting, continence, toileting, and locomotion level  Personal Factors: Age, Behavior pattern, Fitness, Past/current experiences, Time since onset of injury/illness/exacerbation, and 3+ comorbidities: COPD, HTN, diabetes  are also affecting patient's functional outcome.   Rehab Potential: Good  Clinical Decision Making: Evolving/moderate complexity  Evaluation Complexity: Moderate   GOALS: Goals reviewed with patient? Yes  SHORT TERM GOALS: Target date: 12/13/2021  Patient will demonstrate independence with HEP in order to maximize therapeutic gains and improve carryover from physical therapy sessions to ADLs in the home and community. Baseline: toileting posture handout Goal status: INITIAL    LONG TERM GOALS: Target date: 01/24/2022   Patient will score >/= 60 on FOTO Urinary Problem  in order to demonstrate improved IAP management, improved PFM coordination, and overall improved QOL.  Baseline: 52 Goal status: INITIAL  2.  Patient will report decreased reliance on protective undergarments as indicated by a 24 hour period to demonstrate improved bladder control and allow for  increased participation in activities outside of the home. Baseline: Lvl 2 (normally) and Lvl 3 (exercise) incontinence pads about 6-8x/day Goal status: INITIAL  3.  Patient will report less than 5 incidents of stress urinary incontinence over the course of 3 weeks while lifting/bending/squatting/prolonged activity (exercise) in order to demonstrate improved PFM coordination, strength, and function for improved overall QOL. Baseline: leakage with all the above every time Goal status: INITIAL  4.  Patient will report being able to return to activities including, but not limited to: physical activity, long walk, dancing, sexual intercourse without leakage or limitation to indicate complete resolution of the chief concern and return to prior level of participation at home and in the community. Baseline: unable to participate in activities due to leakage and apprehension  Goal status: INITIAL  5.  Patient will report confidence in ability to control bladder > 7/10 in order to demonstrate improved function and ability to participate more fully in activities at home and in the community. Baseline: 4/10 Goal status: INITIAL   PLAN: PT Frequency: 1x/week  PT Duration: 12 weeks  Planned Interventions: Therapeutic exercises, Therapeutic activity, Neuromuscular re-education, Balance training, Gait training, Patient/Family education, Self Care, Joint mobilization, Spinal mobilization, Cryotherapy, Moist heat, scar mobilization, Taping, and Manual therapy  Plan For Next Session: finish phy assessment, begin deep core    Troi Bechtold, PT, DPT  11/01/2021, 12:20 PM

## 2021-11-08 ENCOUNTER — Ambulatory Visit: Payer: 59

## 2021-11-08 DIAGNOSIS — M6281 Muscle weakness (generalized): Secondary | ICD-10-CM

## 2021-11-08 DIAGNOSIS — M6289 Other specified disorders of muscle: Secondary | ICD-10-CM | POA: Diagnosis not present

## 2021-11-08 DIAGNOSIS — R278 Other lack of coordination: Secondary | ICD-10-CM

## 2021-11-08 NOTE — Therapy (Signed)
OUTPATIENT PHYSICAL THERAPY FEMALE PELVIC TREATMENT   Patient Name: [name] MRN: [id number] DOB:1956/03/04, 66 y.o., female Today's Date: 11/08/2021   PT End of Session - 11/08/21 0752     Visit Number 2    Number of Visits 12    Date for PT Re-Evaluation 01/24/22    PT Start Time 0800    PT Stop Time 0840    PT Time Calculation (min) 40 min    Activity Tolerance Patient tolerated treatment well             Past Medical History:  Diagnosis Date   Asthma    Depression    Diabetes mellitus without complication ([place])    Hyperlipidemia    Kidney stones    Migraine headache    Past Surgical History:  Procedure Laterality Date   ABDOMINAL HYSTERECTOMY     BREAST CYST ASPIRATION Right 1981   benign   BREAST CYST ASPIRATION Right 1984   benign   CHOLECYSTECTOMY     COLONOSCOPY  March 2016   FLEXIBLE BRONCHOSCOPY N/A 01/26/2018   Procedure: FLEXIBLE BRONCHOSCOPY;  Surgeon: Laverle Hobby, MD;  Location: [facility] ORS;  Service: Pulmonary;  Laterality: N/A;   MENISCUS REPAIR Left 09/18/2018   OOPHORECTOMY     PARTIAL KNEE ARTHROPLASTY Left 02/2019   Patient Active Problem List   Diagnosis Date Noted   Statin myopathy 10/16/2021   Obesity (BMI 35.0-39.9 without comorbidity) 06/13/2021   COPD (chronic obstructive pulmonary disease) ([place]) 01/19/2021   Morbid obesity ([place]) 01/19/2021   Gastroesophageal reflux disease without esophagitis 01/19/2021   Absence of bladder continence 11/30/2020   Recurrent major depressive disorder, in full remission ([place]) 11/30/2020   Chronic venous insufficiency 10/05/2020   Pain and swelling of lower leg 09/18/2020   Raynauds phenomenon 09/18/2020   Hyperlipidemia 09/18/2020   COVID-19 04/30/2020   History of total knee arthroplasty 09/08/2019   Vitamin D deficiency 12/03/2018   Knee arthropathy 07/28/2018   Anxiety 04/07/2018   Hyperlipidemia associated with type 2 diabetes mellitus ([place]) 04/07/2018   Mixed stress and  urge urinary incontinence 02/25/2017   Right sided weakness 09/26/2016   Asthma 11/15/2015   Essential hypertension 10/20/2014   Diabetes mellitus associated with hormonal etiology ([place]) 10/20/2014   Celiac sprue 10/20/2014   BMI 40.0-44.9, adult ([place]) 10/20/2014   Fatty infiltration of liver 08/31/2014    PCP: Park Liter, DO  REFERRING PROVIDER: Valerie Roys, DO   REFERRING DIAG:  N39.46 (ICD-10-CM) - Mixed stress and urge urinary incontinence   THERAPY DIAG:  Pelvic floor dysfunction  Other lack of coordination  Muscle weakness (generalized)  Rationale for Evaluation and Treatment: Rehabilitation  ONSET DATE: 5 years ago   PRECAUTIONS: None  WEIGHT BEARING RESTRICTIONS: No  FALLS:  Has patient fallen in last 6 months? 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 Pt has lost weight but noticed the leakage was heavier with increased weight. Pt has tried to pinpoint when the leakage is worse (with certain food or exercise) but cannot figure it out. Pt was taking one BP medication that encouraged more fluid removal but Dr has since changed it and leakage has been better but is still present and limits her. Pt tries not to lift anything because she knows she will leak. Pt hasn't noticed too much leakage with walking but still is present. Pt has a  hx of intense core exercises. Pt does have COPD and cannot lie flat for very long, Pt sleeps with multiple pillows and feels her COPD is well managed. Walking 7 days a week (2-3 miles), and working with a Physiological scientist 3x per week, a lot of strength training. Has to wear a Level 3 pad with any physical activity.    PATIENT GOALS: Pt would like to stop the leakage and not wear pads, and strengthen parts of her body that she is not aware of     UROLOGICAL HISTORY Fluid  intake: Yes: water all day w/lemon/limes, diet soft drink very occasionally   Pain with urination: No Fully empty bladder: Yes Stream:  sitting/but does stop and go  Urgency: Yes  Frequency: 6-10x/day  Nocturia: 1x Leakage: Walking to the bathroom, Exercise, Lifting, Bending forward, and Intercourse Pads: Yes Type: incontinence pads, #2, when out in the community #3  Amount: 6-8x/day  Bladder control (0-10): 4/10   SEXUAL HISTORY/FUNCTION  Pain with intercourse: No Able to achieve orgasm?: Yes  OBSTETRICAL HISTORY Vaginal deliveries: G4P4 Tearing: Yes: stitches with all of them    GYNECOLOGICAL HISTORY Hysterectomy: yes, abdominal hysterectomy  Pelvic Organ Prolapse: None Pain with exam: no  Heaviness/pressure: no   SUBJECTIVE: Pt reports no significant changes since initial eval.    PAIN:  Are you having pain? No    TODAY'S TREATMENT:   Neuromuscular Re-education  Pre-treatment assessment  OBJECTIVE:    COGNITION: (11/01/21) Overall cognitive status: Within functional limits for tasks assessed     POSTURE:  In seated B plantarflexion with B rounded shoulders    Thoracic kyphosis: slightly in standing  Iliac crest height: L iliac crest  Pelvic obliquity: L posteriorly rotated    GAIT: Trendelenburg: positive on L    RANGE OF MOTION:    (Norm range in degrees)  LEFT 11/01/21 RIGHT 11/01/21  Lumbar forward flexion (65):  WNL    Lumbar extension (30): WNL    Lumbar lateral flexion (25):  Restricted (above knee) Restricted (above knee)  Thoracic and Lumbar rotation (30 degrees):    WNL WNL  Hip Flexion (0-125):   WNL WNL  Hip IR (0-45):  Restricted Restricted  Hip ER (0-45):  WNL WNL  Hip Adduction:      Hip Abduction (0-40):  WNL WNL  Hip extension (0-15):     (*= pain, Blank rows = not tested)   STRENGTH: MMT    RLE 11/08/21 LLE 11/08/21  Hip Flexion 5 5  Hip Extension 4 4  Hip Abduction     Hip Adduction     Hip ER  5 5  Hip IR  5 5   Knee Extension 5 5  Knee Flexion 4 4  Dorsiflexion     Plantarflexion (seated) 5 5  (*= pain, Blank rows = not tested)   SPECIAL TESTS:  FABER (SN 81): negative FADIR (SN 94): negative    PALPATION:  Abdominal:  Diastasis:  none Scar mobility: none   EXTERNAL PELVIC EXAM: Patient educated on the purpose of the pelvic exam and articulated understanding; patient consented to the exam verbally.  Breath coordination: present but inconsistent  Voluntary Contraction: present, 2/5 MMT after significant cueing, 1st attempt activated gluteals and abdominals Relaxation: delayed Perineal movement with sustained IAP increase ("bear down"): elevation with gluteal activation and Valsalva Perineal movement with rapid IAP increase ("cough"): no change (0= no contraction, 1= flicker, 2= weak squeeze, 3= fair squeeze with lift, 4= good squeeze and lift  against resistance, 5= strong squeeze against strong resistance)    Neuromuscular Re-education: Supine hooklying diaphragmatic breathing with VCs and TCs for downregulation of the nervous system and improved management of IAP  Seated diaphragmatic breathing with VCs and TCs for downregulation of nervous system and improved management of IAP  Discussion on pursed-lip breathing vs mouth breathing for improved ox  Discussion and demonstration on log roll technique for improved IAP management and to decrease  pain, or worsening of prolapse.    Patient response to interventions: Pt reports having a hard time relaxing and figuring out the breath coordination but is willing to continue trying.    Patient Education:  Patient educated on what to expect during course of physical therapy, POC, and provided with HEP including: toileting posture handout. Discussion on length of time to see improvements in strength and at the pelvic floor. Patient verbalized understanding and returned demonstration. Patient will benefit from further education in order to  maximize compliance and understanding for long-term therapeutic gains.    ASSESSMENT:  Clinical Impression: Patient presents to clinic with excellent motivation to participate in today's session. Upon physical examination, Pt demonstrates deficits in IAP management, PFM strength, PFM coordination, LE strength, ROM and posture as evidenced by increased L iliac crest height, L posteriorly rotated innominate, restricted ROM in B lateral flexion, restricted hip IR B, 4/5 MMT in B hip extension and knee flexion, positive Trendelenburg on L, present but inconsistent breath coordination, 2/5 MMT PFM with significant cueing on proper technique as Pt activated gluteals/abdominals on first attempt, elevation of PFM with gluteal activation and Valsalva maneuver with sustained IAP ("bear down"), and hx of constipation. Patient required significant VCs and TCs for proper diaphragmatic breathing using pursed-lip technique and to disengage forceful activation of abdominals during inhalation. Patient responded well to all active and educational interventions. Patient will benefit from skilled therapeutic intervention to address deficits in IAP management, PFM strength, PFM coordination, PFM endurance, ROM and posture in order to increase PLOF and improve overall QOL.    Objective Impairments: decreased activity tolerance, decreased coordination, decreased endurance, decreased ROM, decreased strength, improper body mechanics, and postural dysfunction.   Activity Limitations: carrying, lifting, bending, squatting, continence, toileting, and locomotion level  Personal Factors: Age, Behavior pattern, Fitness, Past/current experiences, Time since onset of injury/illness/exacerbation, and 3+ comorbidities: COPD, HTN, diabetes  are also affecting patient's functional outcome.   Rehab Potential: Good  Clinical Decision Making: Evolving/moderate complexity  Evaluation Complexity: Moderate   GOALS: Goals reviewed with  patient? Yes  SHORT TERM GOALS: Target date: 12/20/2021  Patient will demonstrate independence with HEP in order to maximize therapeutic gains and improve carryover from physical therapy sessions to ADLs in the home and community. Baseline: toileting posture handout Goal status: INITIAL    LONG TERM GOALS: Target date: 01/31/2022   Patient will score >/= 60 on FOTO Urinary Problem  in order to demonstrate improved IAP management, improved PFM coordination, and overall improved QOL.  Baseline: 52 Goal status: INITIAL  2.  Patient will report decreased reliance on protective undergarments as indicated by a 24 hour period to demonstrate improved bladder control and allow for increased participation in activities outside of the home. Baseline: Lvl 2 (normally) and Lvl 3 (exercise) incontinence pads about 6-8x/day Goal status: INITIAL  3.  Patient will report less than 5 incidents of stress urinary incontinence over the course of 3 weeks while lifting/bending/squatting/prolonged activity (exercise) in order to demonstrate improved PFM coordination, strength, and function for improved overall  QOL. Baseline: leakage with all the above every time Goal status: INITIAL  4.  Patient will report being able to return to activities including, but not limited to: physical activity, long walk, dancing, sexual intercourse without leakage or limitation to indicate complete resolution of the chief concern and return to prior level of participation at home and in the community. Baseline: unable to participate in activities due to leakage and apprehension  Goal status: INITIAL  5.  Patient will report confidence in ability to control bladder > 7/10 in order to demonstrate improved function and ability to participate more fully in activities at home and in the community. Baseline: 4/10 Goal status: INITIAL   PLAN: PT Frequency: 1x/week  PT Duration: 12 weeks  Planned Interventions: Therapeutic  exercises, Therapeutic activity, Neuromuscular re-education, Balance training, Gait training, Patient/Family education, Self Care, Joint mobilization, Spinal mobilization, Cryotherapy, Moist heat, scar mobilization, Taping, and Manual therapy  Plan For Next Session: begin deep core, how did breathing go?, lengthen L   Shaneca Orne, PT, DPT  11/08/2021, 7:53 AM

## 2021-11-12 ENCOUNTER — Telehealth: Payer: Self-pay

## 2021-11-12 NOTE — Telephone Encounter (Signed)
HCTZ was discontinued. Please confirm she is no longer taking it. cr

## 2021-11-12 NOTE — Telephone Encounter (Signed)
Refill request for HCTZ 50 mg   LOV 10/16/21  Upcoming appt  04/17/22

## 2021-11-13 NOTE — Telephone Encounter (Signed)
Patient did not request refill, she is aware she is not suppose to be taking that medication.

## 2021-11-14 ENCOUNTER — Encounter: Payer: Self-pay | Admitting: Family Medicine

## 2021-11-15 ENCOUNTER — Ambulatory Visit: Payer: 59

## 2021-11-15 DIAGNOSIS — M6289 Other specified disorders of muscle: Secondary | ICD-10-CM

## 2021-11-15 DIAGNOSIS — R278 Other lack of coordination: Secondary | ICD-10-CM

## 2021-11-15 DIAGNOSIS — M6281 Muscle weakness (generalized): Secondary | ICD-10-CM

## 2021-11-15 MED ORDER — BREZTRI AEROSPHERE 160-9-4.8 MCG/ACT IN AERO
2.0000 | INHALATION_SPRAY | Freq: Two times a day (BID) | RESPIRATORY_TRACT | 11 refills | Status: DC
Start: 2021-11-15 — End: 2022-10-17

## 2021-11-15 NOTE — Therapy (Signed)
OUTPATIENT PHYSICAL THERAPY FEMALE PELVIC TREATMENT   Patient Name: [name] MRN: [id number] DOB:[birth year], 66 y.o., female Today's Date: 11/15/2021   PT End of Session - 11/15/21 0755     Visit Number 3    Number of Visits 12    Date for PT Re-Evaluation 01/24/22    PT Start Time 0800    PT Stop Time 0840    PT Time Calculation (min) 40 min             Past Medical History:  Diagnosis Date   Asthma    Depression    Diabetes mellitus without complication ([place])    Hyperlipidemia    Kidney stones    Migraine headache    Past Surgical History:  Procedure Laterality Date   ABDOMINAL HYSTERECTOMY     BREAST CYST ASPIRATION Right 1981   benign   BREAST CYST ASPIRATION Right 1984   benign   CHOLECYSTECTOMY     COLONOSCOPY  March 2016   FLEXIBLE BRONCHOSCOPY N/A 01/26/2018   Procedure: FLEXIBLE BRONCHOSCOPY;  Surgeon: Laverle Hobby, MD;  Location: [facility] ORS;  Service: Pulmonary;  Laterality: N/A;   MENISCUS REPAIR Left 09/18/2018   OOPHORECTOMY     PARTIAL KNEE ARTHROPLASTY Left 02/2019   Patient Active Problem List   Diagnosis Date Noted   Statin myopathy 10/16/2021   Obesity (BMI 35.0-39.9 without comorbidity) 06/13/2021   COPD (chronic obstructive pulmonary disease) ([place]) 01/19/2021   Morbid obesity ([place]) 01/19/2021   Gastroesophageal reflux disease without esophagitis 01/19/2021   Absence of bladder continence 11/30/2020   Recurrent major depressive disorder, in full remission ([place]) 11/30/2020   Chronic venous insufficiency 10/05/2020   Pain and swelling of lower leg 09/18/2020   Raynauds phenomenon 09/18/2020   Hyperlipidemia 09/18/2020   COVID-19 04/30/2020   History of total knee arthroplasty 09/08/2019   Vitamin D deficiency 12/03/2018   Knee arthropathy 07/28/2018   Anxiety 04/07/2018   Hyperlipidemia associated with type 2 diabetes mellitus ([place]) 04/07/2018   Mixed stress and urge urinary incontinence 02/25/2017   Right sided  weakness 09/26/2016   Asthma 11/15/2015   Essential hypertension 10/20/2014   Diabetes mellitus associated with hormonal etiology ([place]) 10/20/2014   Celiac sprue 10/20/2014   BMI 40.0-44.9, adult ([place]) 10/20/2014   Fatty infiltration of liver 08/31/2014    PCP: Park Liter, DO  REFERRING PROVIDER: Valerie Roys, DO   REFERRING DIAG:  N39.46 (ICD-10-CM) - Mixed stress and urge urinary incontinence   THERAPY DIAG:  Pelvic floor dysfunction  Other lack of coordination  Muscle weakness (generalized)  Rationale for Evaluation and Treatment: Rehabilitation  ONSET DATE: 5 years ago   PRECAUTIONS: None  WEIGHT BEARING RESTRICTIONS: No  FALLS:  Has patient fallen in last 6 months? 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 Pt has lost weight but noticed the leakage was heavier with increased weight. Pt has tried to pinpoint when the leakage is worse (with certain food or exercise) but cannot figure it out. Pt was taking one BP medication that encouraged more fluid removal but Dr has since changed it and leakage has been better but is still present and limits her. Pt tries not to lift anything because she knows she will leak. Pt hasn't noticed too much leakage with walking but still is present. Pt has a hx of intense core exercises. Pt does have COPD  and cannot lie flat for very long, Pt sleeps with multiple pillows and feels her COPD is well managed. Walking 7 days a week (2-3 miles), and working with a Physiological scientist 3x per week, a lot of strength training. Has to wear a Level 3 pad with any physical activity.    PATIENT GOALS: Pt would like to stop the leakage and not wear pads, and strengthen parts of her body that she is not aware of     UROLOGICAL HISTORY Fluid intake: Yes: water all day w/lemon/limes, diet soft  drink very occasionally   Pain with urination: No Fully empty bladder: Yes Stream:  sitting/but does stop and go  Urgency: Yes  Frequency: 6-10x/day  Nocturia: 1x Leakage: Walking to the bathroom, Exercise, Lifting, Bending forward, and Intercourse Pads: Yes Type: incontinence pads, #2, when out in the community #3  Amount: 6-8x/day  Bladder control (0-10): 4/10   SEXUAL HISTORY/FUNCTION  Pain with intercourse: No Able to achieve orgasm?: Yes  OBSTETRICAL HISTORY Vaginal deliveries: G4P4 Tearing: Yes: stitches with all of them    GYNECOLOGICAL HISTORY Hysterectomy: yes, abdominal hysterectomy  Pelvic Organ Prolapse: None Pain with exam: no  Heaviness/pressure: no   SUBJECTIVE: Pt was able to practice breathing in supine and seated. Pt feels better sitting doing the breathing due to COPD and the humidity does not help either.    PAIN:  Are you having pain? No   OBJECTIVE:    COGNITION: (11/01/21) Overall cognitive status: Within functional limits for tasks assessed     POSTURE:  In seated B plantarflexion with B rounded shoulders    Thoracic kyphosis: slightly in standing  Iliac crest height: L iliac crest  Pelvic obliquity: L posteriorly rotated    GAIT: Trendelenburg: positive on L    RANGE OF MOTION:    (Norm range in degrees)  LEFT 11/01/21 RIGHT 11/01/21  Lumbar forward flexion (65):  WNL    Lumbar extension (30): WNL    Lumbar lateral flexion (25):  Restricted (above knee) Restricted (above knee)  Thoracic and Lumbar rotation (30 degrees):    WNL WNL  Hip Flexion (0-125):   WNL WNL  Hip IR (0-45):  Restricted Restricted  Hip ER (0-45):  WNL WNL  Hip Adduction:      Hip Abduction (0-40):  WNL WNL  Hip extension (0-15):     (*= pain, Blank rows = not tested)   STRENGTH: MMT    RLE 11/08/21 LLE 11/08/21  Hip Flexion 5 5  Hip Extension 4 4  Hip Abduction  4 4  Hip Adduction     Hip ER  5 5  Hip IR  5 5  Knee Extension 5 5  Knee Flexion 4  4  Dorsiflexion     Plantarflexion (seated) 5 5  (*= pain, Blank rows = not tested)   SPECIAL TESTS:  FABER (SN 81): negative FADIR (SN 94): negative    PALPATION:  Abdominal:  Diastasis:  none Scar mobility: none   EXTERNAL PELVIC EXAM: Patient educated on the purpose of the pelvic exam and articulated understanding; patient consented to the exam verbally.  Breath coordination: present but inconsistent  Voluntary Contraction: present, 2/5 MMT after significant cueing, 1st attempt activated gluteals and abdominals Relaxation: delayed Perineal movement with sustained IAP increase ("bear down"): elevation with gluteal activation and Valsalva Perineal movement with rapid IAP increase ("cough"): no change (0= no contraction, 1= flicker, 2= weak squeeze, 3= fair squeeze with lift, 4= good squeeze and lift against  resistance, 5= strong squeeze against strong resistance)    TODAY'S TREATMENT  Manual Therapy: Abdominal myofascial release for improved tension at fascial slings with cueing for pursed-lip breathing throughout intervention   Neuromuscular Re-education: Assessed B hip abduction MMT - 4/5 B  Supine hooklying diaphragmatic breathing with VCs and TCs for downregulation of the nervous system and improved management of IAP Significant cueing to decrease forceful activation of rectus/obliques  Supine bridging with coordinated breath; however, due to breathing difficulties could not coordinate breath and continued to demonstrate posterior pelvic tilt with bridge - did not continue  Standing hip abduction, Bx10, for improved postural stability and gait   R Sidelying thoracic rotations, x10, for improved lengthening of fascial slings    Patient response to interventions: Pt felt a release in tension in the abdominal cavity after manual intervention   Patient Education:  Patient provided with HEP: seated diaphragmatic breathing, sidelying thoracic rotation, standing hip  abd. Patient verbalized understanding and returned demonstration. Patient educated throughout session on appropriate technique and form using multi-modal cueing, HEP, and activity modification. Patient will benefit from further education in order to maximize compliance and understanding for long-term therapeutic gains.    ASSESSMENT:  Clinical Impression: Patient presents to clinic with excellent motivation to participate in today's session. Pt continues to demonstrate deficits in IAP management, PFM strength, PFM coordination, LE strength, ROM and posture. Pt reports the humidity does cause some flare-up of her COPD. Pt demonstrates 4/5 MMT with hip abduction B and increased rotation at the pelvis during testing. Pt required significant VCs and Tcs for pursed-lip breathing in supine and seated in order to decrease forceful activation of rectus abd/obliques. Pt educated on the natural rise and fall of the abdomen during breathing whether supine or seated. After myofascial release at the abdomen, Pt reports feeling less tension. Pt required moderate cueing for all other active interventions to decrease bodily compensations. Patient responded well to active, manual, and educational interventions and will continue to benefit from skilled therapeutic intervention to address deficits in IAP management, PFM strength, PFM coordination, PFM endurance, ROM and posture in order to increase PLOF and improve overall QOL.    Objective Impairments: decreased activity tolerance, decreased coordination, decreased endurance, decreased ROM, decreased strength, improper body mechanics, and postural dysfunction.   Activity Limitations: carrying, lifting, bending, squatting, continence, toileting, and locomotion level  Personal Factors: Age, Behavior pattern, Fitness, Past/current experiences, Time since onset of injury/illness/exacerbation, and 3+ comorbidities: COPD, HTN, diabetes  are also affecting patient's functional  outcome.   Rehab Potential: Good  Clinical Decision Making: Evolving/moderate complexity  Evaluation Complexity: Moderate   GOALS: Goals reviewed with patient? Yes  SHORT TERM GOALS: Target date: 12/27/2021  Patient will demonstrate independence with HEP in order to maximize therapeutic gains and improve carryover from physical therapy sessions to ADLs in the home and community. Baseline: toileting posture handout Goal status: INITIAL    LONG TERM GOALS: Target date: 02/07/2022   Patient will score >/= 60 on FOTO Urinary Problem  in order to demonstrate improved IAP management, improved PFM coordination, and overall improved QOL.  Baseline: 52 Goal status: INITIAL  2.  Patient will report decreased reliance on protective undergarments as indicated by a 24 hour period to demonstrate improved bladder control and allow for increased participation in activities outside of the home. Baseline: Lvl 2 (normally) and Lvl 3 (exercise) incontinence pads about 6-8x/day Goal status: INITIAL  3.  Patient will report less than 5 incidents of stress urinary incontinence  over the course of 3 weeks while lifting/bending/squatting/prolonged activity (exercise) in order to demonstrate improved PFM coordination, strength, and function for improved overall QOL. Baseline: leakage with all the above every time Goal status: INITIAL  4.  Patient will report being able to return to activities including, but not limited to: physical activity, long walk, dancing, sexual intercourse without leakage or limitation to indicate complete resolution of the chief concern and return to prior level of participation at home and in the community. Baseline: unable to participate in activities due to leakage and apprehension  Goal status: INITIAL  5.  Patient will report confidence in ability to control bladder > 7/10 in order to demonstrate improved function and ability to participate more fully in activities at home and  in the community. Baseline: 4/10 Goal status: INITIAL   PLAN: PT Frequency: 1x/week  PT Duration: 12 weeks  Planned Interventions: Therapeutic exercises, Therapeutic activity, Neuromuscular re-education, Balance training, Gait training, Patient/Family education, Self Care, Joint mobilization, Spinal mobilization, Cryotherapy, Moist heat, scar mobilization, Taping, and Manual therapy  Plan For Next Session: how was breathing this week?/abdomen, start deep core    Roney Youtz, PT, DPT  11/15/2021, 7:55 AM

## 2021-11-17 ENCOUNTER — Encounter: Payer: Self-pay | Admitting: Family Medicine

## 2021-11-20 ENCOUNTER — Telehealth: Payer: Self-pay

## 2021-11-20 NOTE — Telephone Encounter (Signed)
Copied from CRM [id number]. Topic: General - Call Back - No Documentation >> Nov 20, 2021 10:41 AM Franchot Heidelberg wrote: Reason for CRM: Pt called regarding her mychart message about ozempic, she has called around and nobody has ozempic 2.0. She wants to know if the office has samples or if PCP wants to call in an alternative.

## 2021-11-20 NOTE — Telephone Encounter (Signed)
See telephone encounter.

## 2021-11-20 NOTE — Telephone Encounter (Signed)
If we have a 2mg  she can have a 2mg 

## 2021-11-20 NOTE — Telephone Encounter (Signed)
Called patient to notify we have a sample for her to pick up. Patient states she will come by today or send one for it.

## 2021-11-20 NOTE — Telephone Encounter (Signed)
Per MyChart message patient states she takes her last dose today.

## 2021-11-22 ENCOUNTER — Ambulatory Visit: Payer: 59 | Attending: Family Medicine

## 2021-11-22 DIAGNOSIS — M6281 Muscle weakness (generalized): Secondary | ICD-10-CM | POA: Insufficient documentation

## 2021-11-22 DIAGNOSIS — R278 Other lack of coordination: Secondary | ICD-10-CM | POA: Diagnosis present

## 2021-11-22 DIAGNOSIS — M6289 Other specified disorders of muscle: Secondary | ICD-10-CM | POA: Insufficient documentation

## 2021-11-22 NOTE — Therapy (Signed)
OUTPATIENT PHYSICAL THERAPY FEMALE PELVIC TREATMENT   Patient Name: [name] MRN: [id number] DOB:03-26-1956, 66 y.o., female Today's Date: 11/22/2021   PT End of Session - 11/22/21 0801     Visit Number 4    Number of Visits 12    Date for PT Re-Evaluation 01/24/22    PT Start Time 0800    PT Stop Time 0840    PT Time Calculation (min) 40 min    Activity Tolerance Patient tolerated treatment well             Past Medical History:  Diagnosis Date   Asthma    Depression    Diabetes mellitus without complication ([place])    Hyperlipidemia    Kidney stones    Migraine headache    Past Surgical History:  Procedure Laterality Date   ABDOMINAL HYSTERECTOMY     BREAST CYST ASPIRATION Right 1981   benign   BREAST CYST ASPIRATION Right 1984   benign   CHOLECYSTECTOMY     COLONOSCOPY  March 2016   FLEXIBLE BRONCHOSCOPY N/A 01/26/2018   Procedure: FLEXIBLE BRONCHOSCOPY;  Surgeon: Laverle Hobby, MD;  Location: [facility] ORS;  Service: Pulmonary;  Laterality: N/A;   MENISCUS REPAIR Left 09/18/2018   OOPHORECTOMY     PARTIAL KNEE ARTHROPLASTY Left 02/2019   Patient Active Problem List   Diagnosis Date Noted   Statin myopathy 10/16/2021   Obesity (BMI 35.0-39.9 without comorbidity) 06/13/2021   COPD (chronic obstructive pulmonary disease) ([place]) 01/19/2021   Morbid obesity ([place]) 01/19/2021   Gastroesophageal reflux disease without esophagitis 01/19/2021   Absence of bladder continence 11/30/2020   Recurrent major depressive disorder, in full remission ([place]) 11/30/2020   Chronic venous insufficiency 10/05/2020   Pain and swelling of lower leg 09/18/2020   Raynauds phenomenon 09/18/2020   Hyperlipidemia 09/18/2020   COVID-19 04/30/2020   History of total knee arthroplasty 09/08/2019   Vitamin D deficiency 12/03/2018   Knee arthropathy 07/28/2018   Anxiety 04/07/2018   Hyperlipidemia associated with type 2 diabetes mellitus ([place]) 04/07/2018   Mixed stress and  urge urinary incontinence 02/25/2017   Right sided weakness 09/26/2016   Asthma 11/15/2015   Essential hypertension 10/20/2014   Diabetes mellitus associated with hormonal etiology ([place]) 10/20/2014   Celiac sprue 10/20/2014   BMI 40.0-44.9, adult ([place]) 10/20/2014   Fatty infiltration of liver 08/31/2014    PCP: Park Liter, DO  REFERRING PROVIDER: Valerie Roys, DO   REFERRING DIAG:  N39.46 (ICD-10-CM) - Mixed stress and urge urinary incontinence   THERAPY DIAG:  Pelvic floor dysfunction  Other lack of coordination  Muscle weakness (generalized)  Rationale for Evaluation and Treatment: Rehabilitation  ONSET DATE: 5 years ago   PRECAUTIONS: None  WEIGHT BEARING RESTRICTIONS: No  FALLS:  Has patient fallen in last 6 months? 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 Pt has lost weight but noticed the leakage was heavier with increased weight. Pt has tried to pinpoint when the leakage is worse (with certain food or exercise) but cannot figure it out. Pt was taking one BP medication that encouraged more fluid removal but Dr has since changed it and leakage has been better but is still present and limits her. Pt tries not to lift anything because she knows she will leak. Pt hasn't noticed too much leakage with walking but still is present. Pt has a  hx of intense core exercises. Pt does have COPD and cannot lie flat for very long, Pt sleeps with multiple pillows and feels her COPD is well managed. Walking 7 days a week (2-3 miles), and working with a Physiological scientist 3x per week, a lot of strength training. Has to wear a Level 3 pad with any physical activity.    PATIENT GOALS: Pt would like to stop the leakage and not wear pads, and strengthen parts of her body that she is not aware of     UROLOGICAL HISTORY Fluid  intake: Yes: water all day w/lemon/limes, diet soft drink very occasionally   Pain with urination: No Fully empty bladder: Yes Stream:  sitting/but does stop and go  Urgency: Yes  Frequency: 6-10x/day  Nocturia: 1x Leakage: Walking to the bathroom, Exercise, Lifting, Bending forward, and Intercourse Pads: Yes Type: incontinence pads, #2, when out in the community #3  Amount: 6-8x/day  Bladder control (0-10): 4/10   SEXUAL HISTORY/FUNCTION  Pain with intercourse: No Able to achieve orgasm?: Yes  OBSTETRICAL HISTORY Vaginal deliveries: G4P4 Tearing: Yes: stitches with all of them    GYNECOLOGICAL HISTORY Hysterectomy: yes, abdominal hysterectomy  Pelvic Organ Prolapse: None Pain with exam: no  Heaviness/pressure: no   SUBJECTIVE: Pt has been doing well this past week. Pt reports feeling like she is having less leakage because she is not having to change her pad as much.    PAIN:  Are you having pain? No   OBJECTIVE:    COGNITION: (11/01/21) Overall cognitive status: Within functional limits for tasks assessed     POSTURE:  In seated B plantarflexion with B rounded shoulders    Thoracic kyphosis: slightly in standing  Iliac crest height: L iliac crest  Pelvic obliquity: L posteriorly rotated    GAIT: Trendelenburg: positive on L    RANGE OF MOTION:    (Norm range in degrees)  LEFT 11/01/21 RIGHT 11/01/21  Lumbar forward flexion (65):  WNL    Lumbar extension (30): WNL    Lumbar lateral flexion (25):  Restricted (above knee) Restricted (above knee)  Thoracic and Lumbar rotation (30 degrees):    WNL WNL  Hip Flexion (0-125):   WNL WNL  Hip IR (0-45):  Restricted Restricted  Hip ER (0-45):  WNL WNL  Hip Adduction:      Hip Abduction (0-40):  WNL WNL  Hip extension (0-15):     (*= pain, Blank rows = not tested)   STRENGTH: MMT    RLE 11/08/21 LLE 11/08/21  Hip Flexion 5 5  Hip Extension 4 4  Hip Abduction  4 4  Hip Adduction     Hip ER  5 5  Hip  IR  5 5  Knee Extension 5 5  Knee Flexion 4 4  Dorsiflexion     Plantarflexion (seated) 5 5  (*= pain, Blank rows = not tested)   SPECIAL TESTS:  FABER (SN 81): negative FADIR (SN 94): negative    PALPATION:  Abdominal:  Diastasis:  none Scar mobility: none   EXTERNAL PELVIC EXAM: Patient educated on the purpose of the pelvic exam and articulated understanding; patient consented to the exam verbally.  Breath coordination: present but inconsistent  Voluntary Contraction: present, 2/5 MMT after significant cueing, 1st attempt activated gluteals and abdominals Relaxation: delayed Perineal movement with sustained IAP increase ("bear down"): elevation with gluteal activation and Valsalva Perineal movement with rapid IAP increase ("cough"): no change (0= no contraction, 1= flicker, 2= weak squeeze, 3=  fair squeeze with lift, 4= good squeeze and lift against resistance, 5= strong squeeze against strong resistance)    TODAY'S TREATMENT  Manual Therapy: Abdominal myofascial release for improved tension at fascial slings with cueing for pursed-lip breathing throughout intervention Significant improvement in tension from last session  Neuromuscular Re-education: Supine hooklying diaphragmatic breathing with VCs and TCs for downregulation of the nervous system and improved management of IAP  [facility] abdominal rehab   Supine hooklying TrA contraction with coordinated exhale   Seated hooklying diaphragmatic breathing with VCs and TCs for downregulation of the nervous system and improved management of IAP  Seated TrA activation for improved IAP management, significant cueing required   Patient response to interventions: Pt felt diaphragmatic breathing to be easier in seated   Patient Education:  Patient provided with HEP: seated diaphragmatic breathing/seated and supine TrA activation. Patient verbalized understanding and returned demonstration. Patient educated throughout  session on appropriate technique and form using multi-modal cueing, HEP, and activity modification. Patient will benefit from further education in order to maximize compliance and understanding for long-term therapeutic gains.     ASSESSMENT:  Clinical Impression: Patient presents to clinic with excellent motivation to participate in today's session. Pt continues to demonstrate deficits in IAP management, PFM strength, PFM coordination, LE strength, ROM and posture. Upon palpation at the abdomen, Pt with significant improvement in fascial tension. Pt also noticed a difference within the past week in regards to tension in the abdomen. Pt required significant VCs and Tcs for pursed-lip breathing in seated to allow natural rise of abdomen versus increased chest expansion. With increased time and coordination, Pt able to demonstrate improvement. Pt educated on the anatomy of the TrA muscle and how it differs from larger muscle groups such as the quads or biceps. Pt verbalized understanding. Patient responded well to active, manual, and educational interventions and will continue to benefit from skilled therapeutic intervention to address deficits in IAP management, PFM strength, PFM coordination, PFM endurance, ROM and posture in order to increase PLOF and improve overall QOL.    Objective Impairments: decreased activity tolerance, decreased coordination, decreased endurance, decreased ROM, decreased strength, improper body mechanics, and postural dysfunction.   Activity Limitations: carrying, lifting, bending, squatting, continence, toileting, and locomotion level  Personal Factors: Age, Behavior pattern, Fitness, Past/current experiences, Time since onset of injury/illness/exacerbation, and 3+ comorbidities: COPD, HTN, diabetes  are also affecting patient's functional outcome.   Rehab Potential: Good  Clinical Decision Making: Evolving/moderate complexity  Evaluation Complexity:  Moderate   GOALS: Goals reviewed with patient? Yes  SHORT TERM GOALS: Target date: 01/03/2022  Patient will demonstrate independence with HEP in order to maximize therapeutic gains and improve carryover from physical therapy sessions to ADLs in the home and community. Baseline: toileting posture handout Goal status: INITIAL    LONG TERM GOALS: Target date: 02/14/2022   Patient will score >/= 60 on FOTO Urinary Problem  in order to demonstrate improved IAP management, improved PFM coordination, and overall improved QOL.  Baseline: 52 Goal status: INITIAL  2.  Patient will report decreased reliance on protective undergarments as indicated by a 24 hour period to demonstrate improved bladder control and allow for increased participation in activities outside of the home. Baseline: Lvl 2 (normally) and Lvl 3 (exercise) incontinence pads about 6-8x/day Goal status: INITIAL  3.  Patient will report less than 5 incidents of stress urinary incontinence over the course of 3 weeks while lifting/bending/squatting/prolonged activity (exercise) in order to demonstrate improved PFM coordination, strength, and  function for improved overall QOL. Baseline: leakage with all the above every time Goal status: INITIAL  4.  Patient will report being able to return to activities including, but not limited to: physical activity, long walk, dancing, sexual intercourse without leakage or limitation to indicate complete resolution of the chief concern and return to prior level of participation at home and in the community. Baseline: unable to participate in activities due to leakage and apprehension  Goal status: INITIAL  5.  Patient will report confidence in ability to control bladder > 7/10 in order to demonstrate improved function and ability to participate more fully in activities at home and in the community. Baseline: 4/10 Goal status: INITIAL   PLAN: PT Frequency: 1x/week  PT Duration: 12  weeks  Planned Interventions: Therapeutic exercises, Therapeutic activity, Neuromuscular re-education, Balance training, Gait training, Patient/Family education, Self Care, Joint mobilization, Spinal mobilization, Cryotherapy, Moist heat, scar mobilization, Taping, and Manual therapy  Plan For Next Session: how did deep core go? Progress deep core or practice breathing in different positions.   Rindi Beechy, PT, DPT  11/22/2021, 8:02 AM

## 2021-11-29 ENCOUNTER — Ambulatory Visit: Payer: 59

## 2021-11-29 ENCOUNTER — Encounter: Payer: 59 | Admitting: Family Medicine

## 2021-11-29 DIAGNOSIS — M6289 Other specified disorders of muscle: Secondary | ICD-10-CM | POA: Diagnosis not present

## 2021-11-29 DIAGNOSIS — M6281 Muscle weakness (generalized): Secondary | ICD-10-CM

## 2021-11-29 DIAGNOSIS — R278 Other lack of coordination: Secondary | ICD-10-CM

## 2021-11-29 NOTE — Therapy (Signed)
OUTPATIENT PHYSICAL THERAPY FEMALE PELVIC TREATMENT   Patient Name: [name] MRN: [id number] DOB:10/21/1955, 66 y.o., female Today's Date: 11/29/2021   PT End of Session - 11/29/21 0754     Visit Number 5    Number of Visits 12    Date for PT Re-Evaluation 01/24/22    PT Start Time 0800    PT Stop Time 0840    PT Time Calculation (min) 40 min    Activity Tolerance Patient tolerated treatment well             Past Medical History:  Diagnosis Date   Asthma    Depression    Diabetes mellitus without complication ([place])    Hyperlipidemia    Kidney stones    Migraine headache    Past Surgical History:  Procedure Laterality Date   ABDOMINAL HYSTERECTOMY     BREAST CYST ASPIRATION Right 1981   benign   BREAST CYST ASPIRATION Right 1984   benign   CHOLECYSTECTOMY     COLONOSCOPY  March 2016   FLEXIBLE BRONCHOSCOPY N/A 01/26/2018   Procedure: FLEXIBLE BRONCHOSCOPY;  Surgeon: Laverle Hobby, MD;  Location: [facility] ORS;  Service: Pulmonary;  Laterality: N/A;   MENISCUS REPAIR Left 09/18/2018   OOPHORECTOMY     PARTIAL KNEE ARTHROPLASTY Left 02/2019   Patient Active Problem List   Diagnosis Date Noted   Statin myopathy 10/16/2021   Obesity (BMI 35.0-39.9 without comorbidity) 06/13/2021   COPD (chronic obstructive pulmonary disease) ([place]) 01/19/2021   Morbid obesity ([place]) 01/19/2021   Gastroesophageal reflux disease without esophagitis 01/19/2021   Absence of bladder continence 11/30/2020   Recurrent major depressive disorder, in full remission ([place]) 11/30/2020   Chronic venous insufficiency 10/05/2020   Pain and swelling of lower leg 09/18/2020   Raynauds phenomenon 09/18/2020   Hyperlipidemia 09/18/2020   COVID-19 04/30/2020   History of total knee arthroplasty 09/08/2019   Vitamin D deficiency 12/03/2018   Knee arthropathy 07/28/2018   Anxiety 04/07/2018   Hyperlipidemia associated with type 2 diabetes mellitus ([place]) 04/07/2018   Mixed stress and  urge urinary incontinence 02/25/2017   Right sided weakness 09/26/2016   Asthma 11/15/2015   Essential hypertension 10/20/2014   Diabetes mellitus associated with hormonal etiology ([place]) 10/20/2014   Celiac sprue 10/20/2014   BMI 40.0-44.9, adult ([place]) 10/20/2014   Fatty infiltration of liver 08/31/2014    PCP: Park Liter, DO  REFERRING PROVIDER: Valerie Roys, DO   REFERRING DIAG:  N39.46 (ICD-10-CM) - Mixed stress and urge urinary incontinence   THERAPY DIAG:  Pelvic floor dysfunction  Other lack of coordination  Muscle weakness (generalized)  Rationale for Evaluation and Treatment: Rehabilitation  ONSET DATE: 5 years ago   PRECAUTIONS: None  WEIGHT BEARING RESTRICTIONS: No  FALLS:  Has patient fallen in last 6 months? 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 Pt has lost weight but noticed the leakage was heavier with increased weight. Pt has tried to pinpoint when the leakage is worse (with certain food or exercise) but cannot figure it out. Pt was taking one BP medication that encouraged more fluid removal but Dr has since changed it and leakage has been better but is still present and limits her. Pt tries not to lift anything because she knows she will leak. Pt hasn't noticed too much leakage with walking but still is present. Pt has a  hx of intense core exercises. Pt does have COPD and cannot lie flat for very long, Pt sleeps with multiple pillows and feels her COPD is well managed. Walking 7 days a week (2-3 miles), and working with a Physiological scientist 3x per week, a lot of strength training. Has to wear a Level 3 pad with any physical activity.    PATIENT GOALS: Pt would like to stop the leakage and not wear pads, and strengthen parts of her body that she is not aware of     UROLOGICAL HISTORY Fluid  intake: Yes: water all day w/lemon/limes, diet soft drink very occasionally   Pain with urination: No Fully empty bladder: Yes Stream:  sitting/but does stop and go  Urgency: Yes  Frequency: 6-10x/day  Nocturia: 1x Leakage: Walking to the bathroom, Exercise, Lifting, Bending forward, and Intercourse Pads: Yes Type: incontinence pads, #2, when out in the community #3  Amount: 6-8x/day  Bladder control (0-10): 4/10   SEXUAL HISTORY/FUNCTION  Pain with intercourse: No Able to achieve orgasm?: Yes  OBSTETRICAL HISTORY Vaginal deliveries: G4P4 Tearing: Yes: stitches with all of them    GYNECOLOGICAL HISTORY Hysterectomy: yes, abdominal hysterectomy  Pelvic Organ Prolapse: None Pain with exam: no  Heaviness/pressure: no   SUBJECTIVE: Pt reports doing well. Pt felt she has struggled with HEP this past week because she feels she isn't doing it correctly. Pt did have some flare-ups with asthma this past week as well but the pursed-lip breathing helped control symptoms. She did need to have breathing treatments.    PAIN:  Are you having pain? No   OBJECTIVE:    COGNITION: (11/01/21) Overall cognitive status: Within functional limits for tasks assessed     POSTURE:  In seated B plantarflexion with B rounded shoulders    Thoracic kyphosis: slightly in standing  Iliac crest height: L iliac crest  Pelvic obliquity: L posteriorly rotated    GAIT: Trendelenburg: positive on L    RANGE OF MOTION:    (Norm range in degrees)  LEFT 11/01/21 RIGHT 11/01/21  Lumbar forward flexion (65):  WNL    Lumbar extension (30): WNL    Lumbar lateral flexion (25):  Restricted (above knee) Restricted (above knee)  Thoracic and Lumbar rotation (30 degrees):    WNL WNL  Hip Flexion (0-125):   WNL WNL  Hip IR (0-45):  Restricted Restricted  Hip ER (0-45):  WNL WNL  Hip Adduction:      Hip Abduction (0-40):  WNL WNL  Hip extension (0-15):     (*= pain, Blank rows = not  tested)   STRENGTH: MMT    RLE 11/08/21 LLE 11/08/21  Hip Flexion 5 5  Hip Extension 4 4  Hip Abduction  4 4  Hip Adduction     Hip ER  5 5  Hip IR  5 5  Knee Extension 5 5  Knee Flexion 4 4  Dorsiflexion     Plantarflexion (seated) 5 5  (*= pain, Blank rows = not tested)   SPECIAL TESTS:  FABER (SN 81): negative FADIR (SN 94): negative    PALPATION:  Abdominal:  Diastasis:  none Scar mobility: none   EXTERNAL PELVIC EXAM: Patient educated on the purpose of the pelvic exam and articulated understanding; patient consented to the exam verbally.  Breath coordination: present but inconsistent  Voluntary Contraction: present, 2/5 MMT after significant cueing, 1st attempt activated gluteals and abdominals Relaxation: delayed Perineal movement with sustained IAP increase ("bear down"): elevation with gluteal activation  and Valsalva Perineal movement with rapid IAP increase ("cough"): no change (0= no contraction, 1= flicker, 2= weak squeeze, 3= fair squeeze with lift, 4= good squeeze and lift against resistance, 5= strong squeeze against strong resistance)    TODAY'S TREATMENT  Manual Therapy: B diaphragmatic release with coordinated exhale, x4    Neuromuscular Re-education: Reassessment of posture:  Iliac crest height - improved L iliac crest equal to R compared to IE Pelvic obliquity - continues to be posteriorly rotated; expected as treatment has not yet been focused on this    Seated hooklying diaphragmatic breathing with VCs and TCs for downregulation of the nervous system and improved management of IAP  Seated TrA activation for improved IAP management, significant cueing required  STSs with coordinated breath for improved IAP management, VCs and TCs as needed    Discussion on sympathetic vs parasympathetic nervous system and how cortisol is involved with increased urgency/frequency and urinary leakage. Pt verbalized understanding.    Patient response to  interventions: Pt felt like she could take a better slow inhale after manual technique    Patient Education:  Patient provided with HEP: STSs with coordinated breath. Patient verbalized understanding and returned demonstration. Patient educated throughout session on appropriate technique and form using multi-modal cueing, HEP, and activity modification. Patient will benefit from further education in order to maximize compliance and understanding for long-term therapeutic gains.     ASSESSMENT:  Clinical Impression: Patient presents to clinic with excellent motivation to participate in today's session. Pt continues to demonstrate deficits in IAP management, PFM strength, PFM coordination, LE strength, ROM and posture. Upon brief assessment of posture, Pt demonstrates improvement with L iliac crest height (now equal to R) compared to IE. L innominate continues to be posteriorly rotated. Pt required significant VCs and TCs for proper diaphragmatic breathing to allow abdominal expansion. Pt with improved ability to inhale after diaphragmatic release. Patient responded well to active, manual, and educational interventions and will continue to benefit from skilled therapeutic intervention to address deficits in IAP management, PFM strength, PFM coordination, PFM endurance, ROM and posture in order to increase PLOF and improve overall QOL.    Objective Impairments: decreased activity tolerance, decreased coordination, decreased endurance, decreased ROM, decreased strength, improper body mechanics, and postural dysfunction.   Activity Limitations: carrying, lifting, bending, squatting, continence, toileting, and locomotion level  Personal Factors: Age, Behavior pattern, Fitness, Past/current experiences, Time since onset of injury/illness/exacerbation, and 3+ comorbidities: COPD, HTN, diabetes  are also affecting patient's functional outcome.   Rehab Potential: Good  Clinical Decision Making:  Evolving/moderate complexity  Evaluation Complexity: Moderate   GOALS: Goals reviewed with patient? Yes  SHORT TERM GOALS: Target date: 01/10/2022  Patient will demonstrate independence with HEP in order to maximize therapeutic gains and improve carryover from physical therapy sessions to ADLs in the home and community. Baseline: toileting posture handout Goal status: INITIAL    LONG TERM GOALS: Target date: 02/21/2022 (01/24/22)  Patient will score >/= 60 on FOTO Urinary Problem  in order to demonstrate improved IAP management, improved PFM coordination, and overall improved QOL.  Baseline: 52 Goal status: INITIAL  2.  Patient will report decreased reliance on protective undergarments as indicated by a 24 hour period to demonstrate improved bladder control and allow for increased participation in activities outside of the home. Baseline: Lvl 2 (normally) and Lvl 3 (exercise) incontinence pads about 6-8x/day Goal status: INITIAL  3.  Patient will report less than 5 incidents of stress urinary incontinence over the  course of 3 weeks while lifting/bending/squatting/prolonged activity (exercise) in order to demonstrate improved PFM coordination, strength, and function for improved overall QOL. Baseline: leakage with all the above every time Goal status: INITIAL  4.  Patient will report being able to return to activities including, but not limited to: physical activity, long walk, dancing, sexual intercourse without leakage or limitation to indicate complete resolution of the chief concern and return to prior level of participation at home and in the community. Baseline: unable to participate in activities due to leakage and apprehension  Goal status: INITIAL  5.  Patient will report confidence in ability to control bladder > 7/10 in order to demonstrate improved function and ability to participate more fully in activities at home and in the community. Baseline: 4/10 Goal status:  INITIAL   PLAN: PT Frequency: 1x/week  PT Duration: 12 weeks  Planned Interventions: Therapeutic exercises, Therapeutic activity, Neuromuscular re-education, Balance training, Gait training, Patient/Family education, Self Care, Joint mobilization, Spinal mobilization, Cryotherapy, Moist heat, scar mobilization, Taping, and Manual therapy  Plan For Next Session: deep core progression, how were STSs, [employer], PT, DPT  11/29/2021, 7:55 AM

## 2021-12-06 ENCOUNTER — Ambulatory Visit: Payer: 59

## 2021-12-06 DIAGNOSIS — R278 Other lack of coordination: Secondary | ICD-10-CM

## 2021-12-06 DIAGNOSIS — M6289 Other specified disorders of muscle: Secondary | ICD-10-CM

## 2021-12-06 DIAGNOSIS — M6281 Muscle weakness (generalized): Secondary | ICD-10-CM

## 2021-12-06 NOTE — Therapy (Signed)
OUTPATIENT PHYSICAL THERAPY FEMALE PELVIC TREATMENT   Patient Name: [name] MRN: [id number] DOB:October 23, 1955, 66 y.o., female Today's Date: 12/06/2021   PT End of Session - 12/06/21 0756     Visit Number 6    Number of Visits 12    Date for PT Re-Evaluation 01/24/22    Authorization Type IE: 11/01/21    PT Start Time 0800    PT Stop Time 0840    PT Time Calculation (min) 40 min    Activity Tolerance Patient tolerated treatment well             Past Medical History:  Diagnosis Date   Asthma    Depression    Diabetes mellitus without complication ([place])    Hyperlipidemia    Kidney stones    Migraine headache    Past Surgical History:  Procedure Laterality Date   ABDOMINAL HYSTERECTOMY     BREAST CYST ASPIRATION Right 1981   benign   BREAST CYST ASPIRATION Right 1984   benign   CHOLECYSTECTOMY     COLONOSCOPY  March 2016   FLEXIBLE BRONCHOSCOPY N/A 01/26/2018   Procedure: FLEXIBLE BRONCHOSCOPY;  Surgeon: Laverle Hobby, MD;  Location: [facility] ORS;  Service: Pulmonary;  Laterality: N/A;   MENISCUS REPAIR Left 09/18/2018   OOPHORECTOMY     PARTIAL KNEE ARTHROPLASTY Left 02/2019   Patient Active Problem List   Diagnosis Date Noted   Statin myopathy 10/16/2021   Obesity (BMI 35.0-39.9 without comorbidity) 06/13/2021   COPD (chronic obstructive pulmonary disease) ([place]) 01/19/2021   Morbid obesity ([place]) 01/19/2021   Gastroesophageal reflux disease without esophagitis 01/19/2021   Absence of bladder continence 11/30/2020   Recurrent major depressive disorder, in full remission ([place]) 11/30/2020   Chronic venous insufficiency 10/05/2020   Pain and swelling of lower leg 09/18/2020   Raynauds phenomenon 09/18/2020   Hyperlipidemia 09/18/2020   COVID-19 04/30/2020   History of total knee arthroplasty 09/08/2019   Vitamin D deficiency 12/03/2018   Knee arthropathy 07/28/2018   Anxiety 04/07/2018   Hyperlipidemia associated with type 2 diabetes mellitus  ([place]) 04/07/2018   Mixed stress and urge urinary incontinence 02/25/2017   Right sided weakness 09/26/2016   Asthma 11/15/2015   Essential hypertension 10/20/2014   Diabetes mellitus associated with hormonal etiology ([place]) 10/20/2014   Celiac sprue 10/20/2014   BMI 40.0-44.9, adult ([place]) 10/20/2014   Fatty infiltration of liver 08/31/2014    PCP: Park Liter, DO  REFERRING PROVIDER: Valerie Roys, DO   REFERRING DIAG:  N39.46 (ICD-10-CM) - Mixed stress and urge urinary incontinence   THERAPY DIAG:  Pelvic floor dysfunction  Other lack of coordination  Muscle weakness (generalized)  Rationale for Evaluation and Treatment: Rehabilitation  ONSET DATE: 5 years ago   PRECAUTIONS: None  WEIGHT BEARING RESTRICTIONS: No  FALLS:  Has patient fallen in last 6 months? 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 Pt has lost weight but noticed the leakage was heavier with increased weight. Pt has tried to pinpoint when the leakage is worse (with certain food or exercise) but cannot figure it out. Pt was taking one BP medication that encouraged more fluid removal but Dr has since changed it and leakage has been better but is still present and limits her. Pt tries not to lift anything because she knows she will leak. Pt hasn't noticed too much leakage with walking  but still is present. Pt has a hx of intense core exercises. Pt does have COPD and cannot lie flat for very long, Pt sleeps with multiple pillows and feels her COPD is well managed. Walking 7 days a week (2-3 miles), and working with a Physiological scientist 3x per week, a lot of strength training. Has to wear a Level 3 pad with any physical activity.    PATIENT GOALS: Pt would like to stop the leakage and not wear pads, and strengthen parts of her body that she is not aware  of     UROLOGICAL HISTORY Fluid intake: Yes: water all day w/lemon/limes, diet soft drink very occasionally   Pain with urination: No Fully empty bladder: Yes Stream:  sitting/but does stop and go  Urgency: Yes  Frequency: 6-10x/day  Nocturia: 1x Leakage: Walking to the bathroom, Exercise, Lifting, Bending forward, and Intercourse Pads: Yes Type: incontinence pads, #2, when out in the community #3  Amount: 6-8x/day  Bladder control (0-10): 4/10   SEXUAL HISTORY/FUNCTION  Pain with intercourse: No Able to achieve orgasm?: Yes  OBSTETRICAL HISTORY Vaginal deliveries: G4P4 Tearing: Yes: stitches with all of them    GYNECOLOGICAL HISTORY Hysterectomy: yes, abdominal hysterectomy  Pelvic Organ Prolapse: None Pain with exam: no  Heaviness/pressure: no   SUBJECTIVE: Pt reports practicing HEP this past week. Pt has had to do breathing treatments every day due to the humidity. Pt sees doctor every 3 months.    PAIN:  Are you having pain? No    OBJECTIVE:    COGNITION: (11/01/21) Overall cognitive status: Within functional limits for tasks assessed     POSTURE:  In seated B plantarflexion with B rounded shoulders    Thoracic kyphosis: slightly in standing  Iliac crest height: L iliac crest  Pelvic obliquity: L posteriorly rotated    GAIT: Trendelenburg: positive on L    RANGE OF MOTION:    (Norm range in degrees)  LEFT 11/01/21 RIGHT 11/01/21  Lumbar forward flexion (65):  WNL    Lumbar extension (30): WNL    Lumbar lateral flexion (25):  Restricted (above knee) Restricted (above knee)  Thoracic and Lumbar rotation (30 degrees):    WNL WNL  Hip Flexion (0-125):   WNL WNL  Hip IR (0-45):  Restricted Restricted  Hip ER (0-45):  WNL WNL  Hip Adduction:      Hip Abduction (0-40):  WNL WNL  Hip extension (0-15):     (*= pain, Blank rows = not tested)   STRENGTH: MMT    RLE 11/08/21 LLE 11/08/21  Hip Flexion 5 5  Hip Extension 4 4  Hip Abduction  4 4   Hip Adduction     Hip ER  5 5  Hip IR  5 5  Knee Extension 5 5  Knee Flexion 4 4  Dorsiflexion     Plantarflexion (seated) 5 5  (*= pain, Blank rows = not tested)   SPECIAL TESTS:  FABER (SN 81): negative FADIR (SN 94): negative    PALPATION:  Abdominal:  Diastasis:  none Scar mobility: none   EXTERNAL PELVIC EXAM: Patient educated on the purpose of the pelvic exam and articulated understanding; patient consented to the exam verbally.  Breath coordination: present but inconsistent  Voluntary Contraction: present, 2/5 MMT after significant cueing, 1st attempt activated gluteals and abdominals Relaxation: delayed Perineal movement with sustained IAP increase ("bear down"): elevation with gluteal activation and Valsalva Perineal movement with rapid IAP increase ("cough"): no change (0= no contraction,  1= flicker, 2= weak squeeze, 3= fair squeeze with lift, 4= good squeeze and lift against resistance, 5= strong squeeze against strong resistance)    TODAY'S TREATMENT   Neuromuscular Re-education: Reassessment of FOTO: IE Urinary Problem 52  Today - 56   Review of STGs and LTGs below  Discussion on progress and how long it can take for strength to build in the TrA  How the next few weeks will look going forward  Praise for improvement in 6 weeks   Seated piriformis stretch, x15 secs, for pain modulation and improved tissue extensibility   Deep squats for improved IAP management with coordinated breath, significant cueing required to decrease bodily compensations  Deep squats with 8lb weight, but Pt unable to coordinate with breath. Discontinued.  Discussion on practicing deep squats in the gym without weights or just the weighted bar so she can focus on her breathing without leakage Pt had leakage in session during squats   Patient response to interventions: Pt feels she cannot concentrate on holding the weights and breathing at the same time.    Patient  Education:  Patient provided with HEP: seated piriformis pose, squat technique with coordinated breath. Patient verbalized understanding and returned demonstration. Patient educated throughout session on appropriate technique and form using multi-modal cueing, HEP, and activity modification. Patient will benefit from further education in order to maximize compliance and understanding for long-term therapeutic gains.     ASSESSMENT:  Clinical Impression: Patient presents to clinic with excellent motivation to participate in today's session. Pt continues to demonstrate deficits in IAP management, PFM strength, PFM coordination, LE strength, ROM and posture. Upon reassessment of FOTO Urinary Problem (56), Pt demonstrates improvement from IE (52). After review of LTGs and STGs below, Pt also reports decreased frequency of incontinence pad use, not using Lvl 2 incontinence pads in the home, and decreased overall leakage. Pt continues to have urinary leakage with physical activity/squatting. Pt's confidence in her bladder also has improved (7/10) since IE (4/10). Pt required significant cueing during squatting for proper technique with coordinated breath and reduce bodily compensations (increased shoulder elevation with inhale). Patient responded positively to active and educational interventions. Pt will continue to benefit from skilled therapeutic intervention to address deficits in IAP management, PFM strength, PFM coordination, PFM endurance, ROM and posture in order to increase PLOF and improve overall QOL.    Objective Impairments: decreased activity tolerance, decreased coordination, decreased endurance, decreased ROM, decreased strength, improper body mechanics, and postural dysfunction.   Activity Limitations: carrying, lifting, bending, squatting, continence, toileting, and locomotion level  Personal Factors: Age, Behavior pattern, Fitness, Past/current experiences, Time since onset of  injury/illness/exacerbation, and 3+ comorbidities: COPD, HTN, diabetes  are also affecting patient's functional outcome.   Rehab Potential: Good  Clinical Decision Making: Evolving/moderate complexity  Evaluation Complexity: Moderate   GOALS: Goals reviewed with patient? Yes  SHORT TERM GOALS: Target date: 01/17/2022  Patient will demonstrate independence with HEP in order to maximize therapeutic gains and improve carryover from physical therapy sessions to ADLs in the home and community. Baseline: toileting posture handout, (8/17): continues to perform HEP requiring cueing in session Goal status: IN PROGRESS    LONG TERM GOALS: Target date:  (01/24/22)  Patient will score >/= 60 on FOTO Urinary Problem  in order to demonstrate improved IAP management, improved PFM coordination, and overall improved QOL.  Baseline: 52, (8/17): 56  Goal status: IN PROGRESS  2.  Patient will report decreased reliance on protective undergarments as indicated by  a 24 hour period to demonstrate improved bladder control and allow for increased participation in activities outside of the home. Baseline: Lvl 2 (normally) and Lvl 3 (exercise) incontinence pads about 6-8x/day, (8/17): Lvl 3 (outside) changing 2-3x/day, and does not wear pad but still has to change 1x but not everyday  Goal status: IN PROGRESS  3.  Patient will report less than 5 incidents of stress urinary incontinence over the course of 3 weeks while lifting/bending/squatting/prolonged activity (exercise) in order to demonstrate improved PFM coordination, strength, and function for improved overall QOL. Baseline: leakage with all the above every time; (8/17): leakage still with deep squat but not lifting/bending Goal status: IN PROGRESS  4.  Patient will report being able to return to activities including, but not limited to: physical activity, long walk, dancing, sexual intercourse without leakage or limitation to indicate complete resolution  of the chief concern and return to prior level of participation at home and in the community. Baseline: unable to participate in activities due to leakage and apprehension; (8/17): dancing no leakage, apprehensive with intercourse  Goal status: IN PROGRESS  5.  Patient will report confidence in ability to control bladder > 7/10 in order to demonstrate improved function and ability to participate more fully in activities at home and in the community. Baseline: 4/10; (8/17): 7/10  Goal status: IN PROGRESS   PLAN: PT Frequency: 1x/week  PT Duration: 12 weeks  Planned Interventions: Therapeutic exercises, Therapeutic activity, Neuromuscular re-education, Balance training, Gait training, Patient/Family education, Self Care, Joint mobilization, Spinal mobilization, Cryotherapy, Moist heat, scar mobilization, Taping, and Manual therapy  Plan For Next Session:  deep core progression, squat?   Fumie Fiallo, PT, DPT  12/06/2021, 7:57 AM

## 2021-12-12 ENCOUNTER — Other Ambulatory Visit: Payer: Self-pay

## 2021-12-12 DIAGNOSIS — F3342 Major depressive disorder, recurrent, in full remission: Secondary | ICD-10-CM

## 2021-12-12 MED ORDER — MONTELUKAST SODIUM 10 MG PO TABS: 10.0000 mg | ORAL_TABLET | Freq: Every day | ORAL | 1 refills | Status: DC

## 2021-12-12 MED ORDER — BUPROPION HCL ER (SR) 150 MG PO TB12: 150.0000 mg | ORAL_TABLET | Freq: Three times a day (TID) | ORAL | 1 refills | Status: DC

## 2021-12-12 NOTE — Telephone Encounter (Signed)
LOV 10/16/21  Future appt 03/2722

## 2021-12-13 ENCOUNTER — Ambulatory Visit: Payer: 59

## 2021-12-13 DIAGNOSIS — M6281 Muscle weakness (generalized): Secondary | ICD-10-CM

## 2021-12-13 DIAGNOSIS — M6289 Other specified disorders of muscle: Secondary | ICD-10-CM

## 2021-12-13 DIAGNOSIS — R278 Other lack of coordination: Secondary | ICD-10-CM

## 2021-12-13 NOTE — Therapy (Signed)
OUTPATIENT PHYSICAL THERAPY FEMALE PELVIC TREATMENT   Patient Name: [name] MRN: [id number] DOB:10/06/1955, 66 y.o., female Today's Date: 12/13/2021   PT End of Session - 12/13/21 0756     Visit Number 7    Number of Visits 12    Date for PT Re-Evaluation 01/24/22    Authorization Type IE: 11/01/21    PT Start Time 0800    PT Stop Time 0840    PT Time Calculation (min) 40 min    Activity Tolerance Patient tolerated treatment well             Past Medical History:  Diagnosis Date   Asthma    Depression    Diabetes mellitus without complication ([place])    Hyperlipidemia    Kidney stones    Migraine headache    Past Surgical History:  Procedure Laterality Date   ABDOMINAL HYSTERECTOMY     BREAST CYST ASPIRATION Right 1981   benign   BREAST CYST ASPIRATION Right 1984   benign   CHOLECYSTECTOMY     COLONOSCOPY  March 2016   FLEXIBLE BRONCHOSCOPY N/A 01/26/2018   Procedure: FLEXIBLE BRONCHOSCOPY;  Surgeon: Laverle Hobby, MD;  Location: [facility] ORS;  Service: Pulmonary;  Laterality: N/A;   MENISCUS REPAIR Left 09/18/2018   OOPHORECTOMY     PARTIAL KNEE ARTHROPLASTY Left 02/2019   Patient Active Problem List   Diagnosis Date Noted   Statin myopathy 10/16/2021   Obesity (BMI 35.0-39.9 without comorbidity) 06/13/2021   COPD (chronic obstructive pulmonary disease) ([place]) 01/19/2021   Morbid obesity ([place]) 01/19/2021   Gastroesophageal reflux disease without esophagitis 01/19/2021   Absence of bladder continence 11/30/2020   Recurrent major depressive disorder, in full remission ([place]) 11/30/2020   Chronic venous insufficiency 10/05/2020   Pain and swelling of lower leg 09/18/2020   Raynauds phenomenon 09/18/2020   Hyperlipidemia 09/18/2020   COVID-19 04/30/2020   History of total knee arthroplasty 09/08/2019   Vitamin D deficiency 12/03/2018   Knee arthropathy 07/28/2018   Anxiety 04/07/2018   Hyperlipidemia associated with type 2 diabetes mellitus  ([place]) 04/07/2018   Mixed stress and urge urinary incontinence 02/25/2017   Right sided weakness 09/26/2016   Asthma 11/15/2015   Essential hypertension 10/20/2014   Diabetes mellitus associated with hormonal etiology ([place]) 10/20/2014   Celiac sprue 10/20/2014   BMI 40.0-44.9, adult ([place]) 10/20/2014   Fatty infiltration of liver 08/31/2014    PCP: Park Liter, DO  REFERRING PROVIDER: Valerie Roys, DO   REFERRING DIAG:  N39.46 (ICD-10-CM) - Mixed stress and urge urinary incontinence   THERAPY DIAG:  Pelvic floor dysfunction  Other lack of coordination  Muscle weakness (generalized)  Rationale for Evaluation and Treatment: Rehabilitation  ONSET DATE: 5 years ago   PRECAUTIONS: None  WEIGHT BEARING RESTRICTIONS: No  FALLS:  Has patient fallen in last 6 months? 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 Pt has lost weight but noticed the leakage was heavier with increased weight. Pt has tried to pinpoint when the leakage is worse (with certain food or exercise) but cannot figure it out. Pt was taking one BP medication that encouraged more fluid removal but Dr has since changed it and leakage has been better but is still present and limits her. Pt tries not to lift anything because she knows she will leak. Pt hasn't noticed too much leakage with walking  but still is present. Pt has a hx of intense core exercises. Pt does have COPD and cannot lie flat for very long, Pt sleeps with multiple pillows and feels her COPD is well managed. Walking 7 days a week (2-3 miles), and working with a Physiological scientist 3x per week, a lot of strength training. Has to wear a Level 3 pad with any physical activity.    PATIENT GOALS: Pt would like to stop the leakage and not wear pads, and strengthen parts of her body that she is not aware  of     UROLOGICAL HISTORY Fluid intake: Yes: water all day w/lemon/limes, diet soft drink very occasionally   Pain with urination: No Fully empty bladder: Yes Stream:  sitting/but does stop and go  Urgency: Yes  Frequency: 6-10x/day  Nocturia: 1x Leakage: Walking to the bathroom, Exercise, Lifting, Bending forward, and Intercourse Pads: Yes Type: incontinence pads, #2, when out in the community #3  Amount: 6-8x/day  Bladder control (0-10): 4/10   SEXUAL HISTORY/FUNCTION  Pain with intercourse: No Able to achieve orgasm?: Yes  OBSTETRICAL HISTORY Vaginal deliveries: G4P4 Tearing: Yes: stitches with all of them    GYNECOLOGICAL HISTORY Hysterectomy: yes, abdominal hysterectomy  Pelvic Organ Prolapse: None Pain with exam: no  Heaviness/pressure: no   SUBJECTIVE: Pt has been trying to squat at home and continues to have leakage. Pt has stopped doing the squat rack with her personal trainer due to leakage. Pt reports she feels the deep core more engaged when performing her HEP. Pt also noticed less leakage during penetrative sex which is improvement from before coming to [facility] where she reported heavy leakage.    PAIN:  Are you having pain? No    OBJECTIVE:    COGNITION: (11/01/21) Overall cognitive status: Within functional limits for tasks assessed     POSTURE:  In seated B plantarflexion with B rounded shoulders    Thoracic kyphosis: slightly in standing  Iliac crest height: L iliac crest  Pelvic obliquity: L posteriorly rotated    GAIT: Trendelenburg: positive on L    RANGE OF MOTION:    (Norm range in degrees)  LEFT 11/01/21 RIGHT 11/01/21  Lumbar forward flexion (65):  WNL    Lumbar extension (30): WNL    Lumbar lateral flexion (25):  Restricted (above knee) Restricted (above knee)  Thoracic and Lumbar rotation (30 degrees):    WNL WNL  Hip Flexion (0-125):   WNL WNL  Hip IR (0-45):  Restricted Restricted  Hip ER (0-45):  WNL WNL  Hip Adduction:       Hip Abduction (0-40):  WNL WNL  Hip extension (0-15):     (*= pain, Blank rows = not tested)   STRENGTH: MMT    RLE 11/08/21 LLE 11/08/21  Hip Flexion 5 5  Hip Extension 4 4  Hip Abduction  4 4  Hip Adduction     Hip ER  5 5  Hip IR  5 5  Knee Extension 5 5  Knee Flexion 4 4  Dorsiflexion     Plantarflexion (seated) 5 5  (*= pain, Blank rows = not tested)   SPECIAL TESTS:  FABER (SN 81): negative FADIR (SN 94): negative    PALPATION:  Abdominal:  Diastasis:  none Scar mobility: none   EXTERNAL PELVIC EXAM: Patient educated on the purpose of the pelvic exam and articulated understanding; patient consented to the exam verbally.  Breath coordination: present but inconsistent  Voluntary Contraction: present, 2/5 MMT after significant  cueing, 1st attempt activated gluteals and abdominals Relaxation: delayed Perineal movement with sustained IAP increase ("bear down"): elevation with gluteal activation and Valsalva Perineal movement with rapid IAP increase ("cough"): no change (0= no contraction, 1= flicker, 2= weak squeeze, 3= fair squeeze with lift, 4= good squeeze and lift against resistance, 5= strong squeeze against strong resistance)    TODAY'S TREATMENT  Manual Therapy: Abdominal myofascial release for improved tension at fascial slings  Significant improvement in tension from a few sessions ago  Neuromuscular Re-education: Seated diaphragmatic breathing with VCs and TCs for downregulation of the nervous system and improved management of IAP TrA activation with seated march (LE challenge)  Mini squats at wall for improved IAP management with coordinated breath, significant cueing required for proper technique  Brief discussion on sympathetic nervous system in relation to increased anxiety/apprehension with penetrative sex due to urinary leakage. Increased stress/anxiety can decrease PFM coordination.    Patient response to interventions: Pt had leakage with  deeper wall squat, cued to do mini squat   Patient Education:  Patient provided with HEP: seated march with TrA activation,  wall squat technique with coordinated breath. Patient verbalized understanding and returned demonstration. Patient educated throughout session on appropriate technique and form using multi-modal cueing, HEP, and activity modification. Patient will benefit from further education in order to maximize compliance and understanding for long-term therapeutic gains.     ASSESSMENT:  Clinical Impression: Patient presents to clinic with excellent motivation to participate in today's session. Pt continues to demonstrate deficits in IAP management, PFM strength, PFM coordination, LE strength, ROM and posture. Pt reports she has modified with personal trainer her squats during her sessions to avoid increased urinary leakage. Today's session focused on proper technique of a wall squat and coordinating with breath as well as TrA progression. Upon initial palpation of abdomen, Pt with significant improvement in fascial tension; however still reports occasionally forceful drawing in of superficial abdominal muscles. DPT assured Pt that with time that will improve. Patient responded positively to manual, active, and educational interventions. Pt will continue to benefit from skilled therapeutic intervention to address deficits in IAP management, PFM strength, PFM coordination, PFM endurance, ROM and posture in order to increase PLOF and improve overall QOL.    Objective Impairments: decreased activity tolerance, decreased coordination, decreased endurance, decreased ROM, decreased strength, improper body mechanics, and postural dysfunction.   Activity Limitations: carrying, lifting, bending, squatting, continence, toileting, and locomotion level  Personal Factors: Age, Behavior pattern, Fitness, Past/current experiences, Time since onset of injury/illness/exacerbation, and 3+ comorbidities:  COPD, HTN, diabetes  are also affecting patient's functional outcome.   Rehab Potential: Good  Clinical Decision Making: Evolving/moderate complexity  Evaluation Complexity: Moderate   GOALS: Goals reviewed with patient? Yes  SHORT TERM GOALS: Target date: 01/24/2022  Patient will demonstrate independence with HEP in order to maximize therapeutic gains and improve carryover from physical therapy sessions to ADLs in the home and community. Baseline: toileting posture handout, (8/17): continues to perform HEP requiring cueing in session Goal status: IN PROGRESS    LONG TERM GOALS: Target date:  (01/24/22)  Patient will score >/= 60 on FOTO Urinary Problem  in order to demonstrate improved IAP management, improved PFM coordination, and overall improved QOL.  Baseline: 52, (8/17): 56  Goal status: IN PROGRESS  2.  Patient will report decreased reliance on protective undergarments as indicated by a 24 hour period to demonstrate improved bladder control and allow for increased participation in activities outside of the home.  Baseline: Lvl 2 (normally) and Lvl 3 (exercise) incontinence pads about 6-8x/day, (8/17): Lvl 3 (outside) changing 2-3x/day, and does not wear pad but still has to change 1x but not everyday  Goal status: IN PROGRESS  3.  Patient will report less than 5 incidents of stress urinary incontinence over the course of 3 weeks while lifting/bending/squatting/prolonged activity (exercise) in order to demonstrate improved PFM coordination, strength, and function for improved overall QOL. Baseline: leakage with all the above every time; (8/17): leakage still with deep squat but not lifting/bending Goal status: IN PROGRESS  4.  Patient will report being able to return to activities including, but not limited to: physical activity, long walk, dancing, sexual intercourse without leakage or limitation to indicate complete resolution of the chief concern and return to prior level of  participation at home and in the community. Baseline: unable to participate in activities due to leakage and apprehension; (8/17): dancing no leakage, apprehensive with intercourse  Goal status: IN PROGRESS  5.  Patient will report confidence in ability to control bladder > 7/10 in order to demonstrate improved function and ability to participate more fully in activities at home and in the community. Baseline: 4/10; (8/17): 7/10  Goal status: IN PROGRESS   PLAN: PT Frequency: 1x/week  PT Duration: 12 weeks  Planned Interventions: Therapeutic exercises, Therapeutic activity, Neuromuscular re-education, Balance training, Gait training, Patient/Family education, Self Care, Joint mobilization, Spinal mobilization, Cryotherapy, Moist heat, scar mobilization, Taping, and Manual therapy  Plan For Next Session:  how was deep core progression, PFM lengthen techniques, squat with weight or lower?    Oron Westrup, PT, DPT  12/13/2021, 8:16 AM

## 2021-12-20 ENCOUNTER — Ambulatory Visit: Payer: 59

## 2021-12-20 DIAGNOSIS — M6289 Other specified disorders of muscle: Secondary | ICD-10-CM

## 2021-12-20 DIAGNOSIS — M6281 Muscle weakness (generalized): Secondary | ICD-10-CM

## 2021-12-20 DIAGNOSIS — R278 Other lack of coordination: Secondary | ICD-10-CM

## 2021-12-20 NOTE — Therapy (Signed)
OUTPATIENT PHYSICAL THERAPY FEMALE PELVIC TREATMENT   Patient Name: [name] MRN: [id number] DOB:1955/12/25, 66 y.o., female Today's Date: 12/20/2021   PT End of Session - 12/20/21 0752     Visit Number 8    Number of Visits 12    Date for PT Re-Evaluation 01/24/22    Authorization Type IE: 11/01/21    PT Start Time 0800    PT Stop Time 0840    PT Time Calculation (min) 40 min    Activity Tolerance Patient tolerated treatment well             Past Medical History:  Diagnosis Date   Asthma    Depression    Diabetes mellitus without complication ([place])    Hyperlipidemia    Kidney stones    Migraine headache    Past Surgical History:  Procedure Laterality Date   ABDOMINAL HYSTERECTOMY     BREAST CYST ASPIRATION Right 1981   benign   BREAST CYST ASPIRATION Right 1984   benign   CHOLECYSTECTOMY     COLONOSCOPY  March 2016   FLEXIBLE BRONCHOSCOPY N/A 01/26/2018   Procedure: FLEXIBLE BRONCHOSCOPY;  Surgeon: Laverle Hobby, MD;  Location: [facility] ORS;  Service: Pulmonary;  Laterality: N/A;   MENISCUS REPAIR Left 09/18/2018   OOPHORECTOMY     PARTIAL KNEE ARTHROPLASTY Left 02/2019   Patient Active Problem List   Diagnosis Date Noted   Statin myopathy 10/16/2021   Obesity (BMI 35.0-39.9 without comorbidity) 06/13/2021   COPD (chronic obstructive pulmonary disease) ([place]) 01/19/2021   Morbid obesity ([place]) 01/19/2021   Gastroesophageal reflux disease without esophagitis 01/19/2021   Absence of bladder continence 11/30/2020   Recurrent major depressive disorder, in full remission ([place]) 11/30/2020   Chronic venous insufficiency 10/05/2020   Pain and swelling of lower leg 09/18/2020   Raynauds phenomenon 09/18/2020   Hyperlipidemia 09/18/2020   COVID-19 04/30/2020   History of total knee arthroplasty 09/08/2019   Vitamin D deficiency 12/03/2018   Knee arthropathy 07/28/2018   Anxiety 04/07/2018   Hyperlipidemia associated with type 2 diabetes mellitus  ([place]) 04/07/2018   Mixed stress and urge urinary incontinence 02/25/2017   Right sided weakness 09/26/2016   Asthma 11/15/2015   Essential hypertension 10/20/2014   Diabetes mellitus associated with hormonal etiology ([place]) 10/20/2014   Celiac sprue 10/20/2014   BMI 40.0-44.9, adult ([place]) 10/20/2014   Fatty infiltration of liver 08/31/2014    PCP: Park Liter, DO  REFERRING PROVIDER: Valerie Roys, DO   REFERRING DIAG:  N39.46 (ICD-10-CM) - Mixed stress and urge urinary incontinence   THERAPY DIAG:  Pelvic floor dysfunction  Other lack of coordination  Muscle weakness (generalized)  Rationale for Evaluation and Treatment: Rehabilitation  ONSET DATE: 5 years ago   PRECAUTIONS: None  WEIGHT BEARING RESTRICTIONS: No  FALLS:  Has patient fallen in last 6 months? 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 Pt has lost weight but noticed the leakage was heavier with increased weight. Pt has tried to pinpoint when the leakage is worse (with certain food or exercise) but cannot figure it out. Pt was taking one BP medication that encouraged more fluid removal but Dr has since changed it and leakage has been better but is still present and limits her. Pt tries not to lift anything because she knows she will leak. Pt hasn't noticed too much leakage with walking  but still is present. Pt has a hx of intense core exercises. Pt does have COPD and cannot lie flat for very long, Pt sleeps with multiple pillows and feels her COPD is well managed. Walking 7 days a week (2-3 miles), and working with a Physiological scientist 3x per week, a lot of strength training. Has to wear a Level 3 pad with any physical activity.    PATIENT GOALS: Pt would like to stop the leakage and not wear pads, and strengthen parts of her body that she is not aware  of     UROLOGICAL HISTORY Fluid intake: Yes: water all day w/lemon/limes, diet soft drink very occasionally   Pain with urination: No Fully empty bladder: Yes Stream:  sitting/but does stop and go  Urgency: Yes  Frequency: 6-10x/day  Nocturia: 1x Leakage: Walking to the bathroom, Exercise, Lifting, Bending forward, and Intercourse Pads: Yes Type: incontinence pads, #2, when out in the community #3  Amount: 6-8x/day  Bladder control (0-10): 4/10   SEXUAL HISTORY/FUNCTION  Pain with intercourse: No Able to achieve orgasm?: Yes  OBSTETRICAL HISTORY Vaginal deliveries: G4P4 Tearing: Yes: stitches with all of them    GYNECOLOGICAL HISTORY Hysterectomy: yes, abdominal hysterectomy  Pelvic Organ Prolapse: None Pain with exam: no  Heaviness/pressure: no   SUBJECTIVE: Pt has been able to practice HEP at home but continued to notice some leakage when she lowered more to the ground. Pt had a couple episodes of urinary leakage this past week where she required using Lvl 3 pads.    PAIN:  Are you having pain? No    OBJECTIVE:    COGNITION: (11/01/21) Overall cognitive status: Within functional limits for tasks assessed     POSTURE:  In seated B plantarflexion with B rounded shoulders    Thoracic kyphosis: slightly in standing  Iliac crest height: L iliac crest  Pelvic obliquity: L posteriorly rotated    GAIT: Trendelenburg: positive on L    RANGE OF MOTION:    (Norm range in degrees)  LEFT 11/01/21 RIGHT 11/01/21  Lumbar forward flexion (65):  WNL    Lumbar extension (30): WNL    Lumbar lateral flexion (25):  Restricted (above knee) Restricted (above knee)  Thoracic and Lumbar rotation (30 degrees):    WNL WNL  Hip Flexion (0-125):   WNL WNL  Hip IR (0-45):  Restricted Restricted  Hip ER (0-45):  WNL WNL  Hip Adduction:      Hip Abduction (0-40):  WNL WNL  Hip extension (0-15):     (*= pain, Blank rows = not tested)   STRENGTH: MMT    RLE 11/08/21  LLE 11/08/21  Hip Flexion 5 5  Hip Extension 4 4  Hip Abduction  4 4  Hip Adduction     Hip ER  5 5  Hip IR  5 5  Knee Extension 5 5  Knee Flexion 4 4  Dorsiflexion     Plantarflexion (seated) 5 5  (*= pain, Blank rows = not tested)   SPECIAL TESTS:  FABER (SN 81): negative FADIR (SN 94): negative    PALPATION:  Abdominal:  Diastasis:  none Scar mobility: none   EXTERNAL PELVIC EXAM: Patient educated on the purpose of the pelvic exam and articulated understanding; patient consented to the exam verbally.  Breath coordination: present but inconsistent  Voluntary Contraction: present, 2/5 MMT after significant cueing, 1st attempt activated gluteals and abdominals Relaxation: delayed Perineal movement with sustained IAP increase ("bear down"): elevation with gluteal activation  and Valsalva Perineal movement with rapid IAP increase ("cough"): no change (0= no contraction, 1= flicker, 2= weak squeeze, 3= fair squeeze with lift, 4= good squeeze and lift against resistance, 5= strong squeeze against strong resistance)    TODAY'S TREATMENT  Neuromuscular Re-education: Supine semi-reclined diaphragmatic breathing with VCs and TCs for downregulation of the nervous system and improved management of IAP  Supine hooklying PFM lengthening techniques with diaphragmatic breathing, VCs and TCs as needed              B Single knee to chest   Double knee to chest              "Happy baby" pose   Butterfly pose "adductor stretch"   Child's pose   Brief discussion on sympathetic nervous system and how cortisol levels can impact PFM coordination as Pt feels she had increased leakage this past week.    Patient response to interventions: Pt did not leak with any of the above lengthening techniques   Patient Education:  Patient provided with HEP: PFM lengthening techniques. Patient educated throughout session on appropriate technique and form using multi-modal cueing, HEP, and activity  modification. Patient will benefit from further education in order to maximize compliance and understanding for long-term therapeutic gains.     ASSESSMENT:  Clinical Impression: Patient presents to clinic with excellent motivation to participate in today's session. Pt continues to demonstrate deficits in IAP management, PFM strength, PFM coordination, LE strength, ROM and posture. Pt reports she had more episodes of urinary leakage this past week which required use of Lvl 3 incontinence pad. Discussion on sympathetic nervous system and how increased cortisol levels can impact PFM coordination. Today's session focused on PFM lengthening techniques and Pt required moderate VCs and TCs for proper technique and reminder to always return to diaphragmatic breathing in various positions. Patient responded positively to active and educational interventions. Pt will continue to benefit from skilled therapeutic intervention to address deficits in IAP management, PFM strength, PFM coordination, PFM endurance, ROM and posture in order to increase PLOF and improve overall QOL.    Objective Impairments: decreased activity tolerance, decreased coordination, decreased endurance, decreased ROM, decreased strength, improper body mechanics, and postural dysfunction.   Activity Limitations: carrying, lifting, bending, squatting, continence, toileting, and locomotion level  Personal Factors: Age, Behavior pattern, Fitness, Past/current experiences, Time since onset of injury/illness/exacerbation, and 3+ comorbidities: COPD, HTN, diabetes  are also affecting patient's functional outcome.   Rehab Potential: Good  Clinical Decision Making: Evolving/moderate complexity  Evaluation Complexity: Moderate   GOALS: Goals reviewed with patient? Yes  SHORT TERM GOALS: Target date: 01/31/2022  Patient will demonstrate independence with HEP in order to maximize therapeutic gains and improve carryover from physical  therapy sessions to ADLs in the home and community. Baseline: toileting posture handout, (8/17): continues to perform HEP requiring cueing in session Goal status: IN PROGRESS    LONG TERM GOALS: Target date:  (01/24/22)  Patient will score >/= 60 on FOTO Urinary Problem  in order to demonstrate improved IAP management, improved PFM coordination, and overall improved QOL.  Baseline: 52, (8/17): 56  Goal status: IN PROGRESS  2.  Patient will report decreased reliance on protective undergarments as indicated by a 24 hour period to demonstrate improved bladder control and allow for increased participation in activities outside of the home. Baseline: Lvl 2 (normally) and Lvl 3 (exercise) incontinence pads about 6-8x/day, (8/17): Lvl 3 (outside) changing 2-3x/day, and does not wear pad but still has  to change 1x but not everyday  Goal status: IN PROGRESS  3.  Patient will report less than 5 incidents of stress urinary incontinence over the course of 3 weeks while lifting/bending/squatting/prolonged activity (exercise) in order to demonstrate improved PFM coordination, strength, and function for improved overall QOL. Baseline: leakage with all the above every time; (8/17): leakage still with deep squat but not lifting/bending Goal status: IN PROGRESS  4.  Patient will report being able to return to activities including, but not limited to: physical activity, long walk, dancing, sexual intercourse without leakage or limitation to indicate complete resolution of the chief concern and return to prior level of participation at home and in the community. Baseline: unable to participate in activities due to leakage and apprehension; (8/17): dancing no leakage, apprehensive with intercourse  Goal status: IN PROGRESS  5.  Patient will report confidence in ability to control bladder > 7/10 in order to demonstrate improved function and ability to participate more fully in activities at home and in the  community. Baseline: 4/10; (8/17): 7/10  Goal status: IN PROGRESS   PLAN: PT Frequency: 1x/week  PT Duration: 12 weeks  Planned Interventions: Therapeutic exercises, Therapeutic activity, Neuromuscular re-education, Balance training, Gait training, Patient/Family education, Self Care, Joint mobilization, Spinal mobilization, Cryotherapy, Moist heat, scar mobilization, Taping, and Manual therapy  Plan For Next Session:  how did lengthening techniques go? Continue deep core    Xin Klawitter, PT, DPT  12/20/2021, 7:53 AM

## 2021-12-27 ENCOUNTER — Ambulatory Visit: Payer: 59 | Attending: Family Medicine

## 2021-12-27 DIAGNOSIS — M6281 Muscle weakness (generalized): Secondary | ICD-10-CM

## 2021-12-27 DIAGNOSIS — R278 Other lack of coordination: Secondary | ICD-10-CM

## 2021-12-27 DIAGNOSIS — M6289 Other specified disorders of muscle: Secondary | ICD-10-CM

## 2021-12-27 NOTE — Therapy (Signed)
OUTPATIENT PHYSICAL THERAPY FEMALE PELVIC TREATMENT   Patient Name: [name] MRN: [id number] DOB:1955/06/25, 66 y.o., female Today's Date: 12/27/2021   PT End of Session - 12/27/21 0754     Visit Number 9    Number of Visits 12    Date for PT Re-Evaluation 01/24/22    Authorization Type IE: 11/01/21    PT Start Time 0800    PT Stop Time 0840    PT Time Calculation (min) 40 min    Activity Tolerance Patient tolerated treatment well             Past Medical History:  Diagnosis Date   Asthma    Depression    Diabetes mellitus without complication ([place])    Hyperlipidemia    Kidney stones    Migraine headache    Past Surgical History:  Procedure Laterality Date   ABDOMINAL HYSTERECTOMY     BREAST CYST ASPIRATION Right 1981   benign   BREAST CYST ASPIRATION Right 1984   benign   CHOLECYSTECTOMY     COLONOSCOPY  March 2016   FLEXIBLE BRONCHOSCOPY N/A 01/26/2018   Procedure: FLEXIBLE BRONCHOSCOPY;  Surgeon: Laverle Hobby, MD;  Location: [facility] ORS;  Service: Pulmonary;  Laterality: N/A;   MENISCUS REPAIR Left 09/18/2018   OOPHORECTOMY     PARTIAL KNEE ARTHROPLASTY Left 02/2019   Patient Active Problem List   Diagnosis Date Noted   Statin myopathy 10/16/2021   Obesity (BMI 35.0-39.9 without comorbidity) 06/13/2021   COPD (chronic obstructive pulmonary disease) ([place]) 01/19/2021   Morbid obesity ([place]) 01/19/2021   Gastroesophageal reflux disease without esophagitis 01/19/2021   Absence of bladder continence 11/30/2020   Recurrent major depressive disorder, in full remission ([place]) 11/30/2020   Chronic venous insufficiency 10/05/2020   Pain and swelling of lower leg 09/18/2020   Raynauds phenomenon 09/18/2020   Hyperlipidemia 09/18/2020   COVID-19 04/30/2020   History of total knee arthroplasty 09/08/2019   Vitamin D deficiency 12/03/2018   Knee arthropathy 07/28/2018   Anxiety 04/07/2018   Hyperlipidemia associated with type 2 diabetes mellitus ([place])  04/07/2018   Mixed stress and urge urinary incontinence 02/25/2017   Right sided weakness 09/26/2016   Asthma 11/15/2015   Essential hypertension 10/20/2014   Diabetes mellitus associated with hormonal etiology ([place]) 10/20/2014   Celiac sprue 10/20/2014   BMI 40.0-44.9, adult ([place]) 10/20/2014   Fatty infiltration of liver 08/31/2014    PCP: Park Liter, DO  REFERRING PROVIDER: Valerie Roys, DO   REFERRING DIAG:  N39.46 (ICD-10-CM) - Mixed stress and urge urinary incontinence   THERAPY DIAG:  Pelvic floor dysfunction  Other lack of coordination  Muscle weakness (generalized)  Rationale for Evaluation and Treatment: Rehabilitation  ONSET DATE: 5 years ago   PRECAUTIONS: None  WEIGHT BEARING RESTRICTIONS: No  FALLS:  Has patient fallen in last 6 months? 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 Pt has lost weight but noticed the leakage was heavier with increased weight. Pt has tried to pinpoint when the leakage is worse (with certain food or exercise) but cannot figure it out. Pt was taking one BP medication that encouraged more fluid removal but Dr has since changed it and leakage has been better but is still present and limits her. Pt tries not to lift anything because she knows she will leak. Pt hasn't noticed too much leakage with walking  but still is present. Pt has a hx of intense core exercises. Pt does have COPD and cannot lie flat for very long, Pt sleeps with multiple pillows and feels her COPD is well managed. Walking 7 days a week (2-3 miles), and working with a Physiological scientist 3x per week, a lot of strength training. Has to wear a Level 3 pad with any physical activity.    PATIENT GOALS: Pt would like to stop the leakage and not wear pads, and strengthen parts of her body that she is not aware of      UROLOGICAL HISTORY Fluid intake: Yes: water all day w/lemon/limes, diet soft drink very occasionally   Pain with urination: No Fully empty bladder: Yes Stream:  sitting/but does stop and go  Urgency: Yes  Frequency: 6-10x/day  Nocturia: 1x Leakage: Walking to the bathroom, Exercise, Lifting, Bending forward, and Intercourse Pads: Yes Type: incontinence pads, #2, when out in the community #3  Amount: 6-8x/day  Bladder control (0-10): 4/10   SEXUAL HISTORY/FUNCTION  Pain with intercourse: No Able to achieve orgasm?: Yes  OBSTETRICAL HISTORY Vaginal deliveries: G4P4 Tearing: Yes: stitches with all of them    GYNECOLOGICAL HISTORY Hysterectomy: yes, abdominal hysterectomy  Pelvic Organ Prolapse: None Pain with exam: no  Heaviness/pressure: no   SUBJECTIVE: Pt has been practicing PFM lengthening techniques and realized she did not leak during the night. Pt is switching between Lvl 2 and 3.    PAIN:  Are you having pain? No    OBJECTIVE:    COGNITION: (11/01/21) Overall cognitive status: Within functional limits for tasks assessed     POSTURE:  In seated B plantarflexion with B rounded shoulders    Thoracic kyphosis: slightly in standing  Iliac crest height: L iliac crest  Pelvic obliquity: L posteriorly rotated    GAIT: Trendelenburg: positive on L    RANGE OF MOTION:    (Norm range in degrees)  LEFT 11/01/21 RIGHT 11/01/21  Lumbar forward flexion (65):  WNL    Lumbar extension (30): WNL    Lumbar lateral flexion (25):  Restricted (above knee) Restricted (above knee)  Thoracic and Lumbar rotation (30 degrees):    WNL WNL  Hip Flexion (0-125):   WNL WNL  Hip IR (0-45):  Restricted Restricted  Hip ER (0-45):  WNL WNL  Hip Adduction:      Hip Abduction (0-40):  WNL WNL  Hip extension (0-15):     (*= pain, Blank rows = not tested)   STRENGTH: MMT    RLE 11/08/21 LLE 11/08/21  Hip Flexion 5 5  Hip Extension 4 4  Hip Abduction  4 4  Hip  Adduction     Hip ER  5 5  Hip IR  5 5  Knee Extension 5 5  Knee Flexion 4 4  Dorsiflexion     Plantarflexion (seated) 5 5  (*= pain, Blank rows = not tested)   SPECIAL TESTS:  FABER (SN 81): negative FADIR (SN 94): negative    PALPATION:  Abdominal:  Diastasis:  none Scar mobility: none   EXTERNAL PELVIC EXAM: Patient educated on the purpose of the pelvic exam and articulated understanding; patient consented to the exam verbally.  Breath coordination: present but inconsistent  Voluntary Contraction: present, 2/5 MMT after significant cueing, 1st attempt activated gluteals and abdominals Relaxation: delayed Perineal movement with sustained IAP increase ("bear down"): elevation with gluteal activation and Valsalva Perineal movement with rapid IAP increase ("cough"): no change (0= no contraction, 1= flicker,  2= weak squeeze, 3= fair squeeze with lift, 4= good squeeze and lift against resistance, 5= strong squeeze against strong resistance)    TODAY'S TREATMENT  Manual Therapy: Anterior mobilization of innominate for improved posture and alignment  Reviewed seated MET for anterior mobilization of innominate as part of HEP   STM at L IT band gluteal structures (used tennis ball for TrP release) Significant tension at IT band with increased pain  Eased with continued intervention  Neuromuscular Re-education: Sidelying diaphragmatic breathing with VCs and TCs for downregulation of the nervous system and improved management of IAP  Standing IT band pose for improved tissue extensibility and pain modulation, VCs and TCs for proper technique   Discussion on movements Pt practices in the gym that cause urinary leakage and how modifications will be demonstrated next session to avoid disrupting PFM coordination and progress made in sessions. Pt verbalized understanding.  Movements causing urinary leakage: floor planks, squats, resistance band biceps curls, Pt will provide the rest  next week 8 mins of heat modality for pain modulation    Patient response to interventions: Pt felt tenderness at L IT band at end of session   Patient Education:  Patient provided with HEP: anterior innominate MET, IT band pose. Patient educated throughout session on appropriate technique and form using multi-modal cueing, HEP, and activity modification. Patient will benefit from further education in order to maximize compliance and understanding for long-term therapeutic gains.     ASSESSMENT:  Clinical Impression: Patient presents to clinic with excellent motivation to participate in today's session. Pt continues to demonstrate deficits in IAP management, PFM strength, PFM coordination, LE strength, ROM and posture. Focus on manual interventions at the innominate in today's session to reduce posterior rotation. Upon palpation, Pt with significant tension at the L IT band which caused increased pain. After STM in the gluteal area and TrP release with tennis ball, Pt's discomfort eased. Pt required moderate VCs and TCs for proper technique and reminder to always return to diaphragmatic breathing in various positions when purpose is tissue extensibility. Patient responded positively to manual, active, and educational interventions. Pt will continue to benefit from skilled therapeutic intervention to address deficits in IAP management, PFM strength, PFM coordination, PFM endurance, ROM and posture in order to increase PLOF and improve overall QOL.    Objective Impairments: decreased activity tolerance, decreased coordination, decreased endurance, decreased ROM, decreased strength, improper body mechanics, and postural dysfunction.   Activity Limitations: carrying, lifting, bending, squatting, continence, toileting, and locomotion level  Personal Factors: Age, Behavior pattern, Fitness, Past/current experiences, Time since onset of injury/illness/exacerbation, and 3+ comorbidities: COPD, HTN,  diabetes  are also affecting patient's functional outcome.   Rehab Potential: Good  Clinical Decision Making: Evolving/moderate complexity  Evaluation Complexity: Moderate   GOALS: Goals reviewed with patient? Yes  SHORT TERM GOALS: Target date: 02/07/2022  Patient will demonstrate independence with HEP in order to maximize therapeutic gains and improve carryover from physical therapy sessions to ADLs in the home and community. Baseline: toileting posture handout, (8/17): continues to perform HEP requiring cueing in session Goal status: IN PROGRESS    LONG TERM GOALS: Target date:  (01/24/22)  Patient will score >/= 60 on FOTO Urinary Problem  in order to demonstrate improved IAP management, improved PFM coordination, and overall improved QOL.  Baseline: 52, (8/17): 56  Goal status: IN PROGRESS  2.  Patient will report decreased reliance on protective undergarments as indicated by a 24 hour period to demonstrate improved bladder control  and allow for increased participation in activities outside of the home. Baseline: Lvl 2 (normally) and Lvl 3 (exercise) incontinence pads about 6-8x/day, (8/17): Lvl 3 (outside) changing 2-3x/day, and does not wear pad but still has to change 1x but not everyday  Goal status: IN PROGRESS  3.  Patient will report less than 5 incidents of stress urinary incontinence over the course of 3 weeks while lifting/bending/squatting/prolonged activity (exercise) in order to demonstrate improved PFM coordination, strength, and function for improved overall QOL. Baseline: leakage with all the above every time; (8/17): leakage still with deep squat but not lifting/bending Goal status: IN PROGRESS  4.  Patient will report being able to return to activities including, but not limited to: physical activity, long walk, dancing, sexual intercourse without leakage or limitation to indicate complete resolution of the chief concern and return to prior level of  participation at home and in the community. Baseline: unable to participate in activities due to leakage and apprehension; (8/17): dancing no leakage, apprehensive with intercourse  Goal status: IN PROGRESS  5.  Patient will report confidence in ability to control bladder > 7/10 in order to demonstrate improved function and ability to participate more fully in activities at home and in the community. Baseline: 4/10; (8/17): 7/10  Goal status: IN PROGRESS   PLAN: PT Frequency: 1x/week  PT Duration: 12 weeks  Planned Interventions: Therapeutic exercises, Therapeutic activity, Neuromuscular re-education, Balance training, Gait training, Patient/Family education, Self Care, Joint mobilization, Spinal mobilization, Cryotherapy, Moist heat, scar mobilization, Taping, and Manual therapy  Plan For Next Session:  progress note, review deep core, go over squats/planks/bicep curls/gym ex that Pt practices    Nichole Montour, PT, DPT  12/27/2021, 7:54 AM

## 2022-01-03 ENCOUNTER — Ambulatory Visit: Payer: 59

## 2022-01-03 DIAGNOSIS — R278 Other lack of coordination: Secondary | ICD-10-CM

## 2022-01-03 DIAGNOSIS — M6289 Other specified disorders of muscle: Secondary | ICD-10-CM | POA: Diagnosis not present

## 2022-01-03 DIAGNOSIS — M6281 Muscle weakness (generalized): Secondary | ICD-10-CM

## 2022-01-03 NOTE — Therapy (Signed)
OUTPATIENT PHYSICAL THERAPY FEMALE PELVIC PROGRESS NOTE/RE-CERT 0/63/01 - 09/21/07   Patient Name: [name] MRN: [id number] DOB:March 23, 1956, 66 y.o., female Today's Date: 01/03/2022   PT End of Session - 01/03/22 0801     Visit Number 10    Number of Visits 20    Date for PT Re-Evaluation 03/06/22    Authorization Type IE: 11/01/21, PN/RC: 01/03/22    PT Start Time 0800    PT Stop Time 0840    PT Time Calculation (min) 40 min    Activity Tolerance Patient tolerated treatment well             Past Medical History:  Diagnosis Date   Asthma    Depression    Diabetes mellitus without complication ([place])    Hyperlipidemia    Kidney stones    Migraine headache    Past Surgical History:  Procedure Laterality Date   ABDOMINAL HYSTERECTOMY     BREAST CYST ASPIRATION Right 1981   benign   BREAST CYST ASPIRATION Right 1984   benign   CHOLECYSTECTOMY     COLONOSCOPY  March 2016   FLEXIBLE BRONCHOSCOPY N/A 01/26/2018   Procedure: FLEXIBLE BRONCHOSCOPY;  Surgeon: Laverle Hobby, MD;  Location: [facility] ORS;  Service: Pulmonary;  Laterality: N/A;   MENISCUS REPAIR Left 09/18/2018   OOPHORECTOMY     PARTIAL KNEE ARTHROPLASTY Left 02/2019   Patient Active Problem List   Diagnosis Date Noted   Statin myopathy 10/16/2021   Obesity (BMI 35.0-39.9 without comorbidity) 06/13/2021   COPD (chronic obstructive pulmonary disease) ([place]) 01/19/2021   Morbid obesity ([place]) 01/19/2021   Gastroesophageal reflux disease without esophagitis 01/19/2021   Absence of bladder continence 11/30/2020   Recurrent major depressive disorder, in full remission ([place]) 11/30/2020   Chronic venous insufficiency 10/05/2020   Pain and swelling of lower leg 09/18/2020   Raynauds phenomenon 09/18/2020   Hyperlipidemia 09/18/2020   COVID-19 04/30/2020   History of total knee arthroplasty 09/08/2019   Vitamin D deficiency 12/03/2018   Knee arthropathy 07/28/2018   Anxiety 04/07/2018    Hyperlipidemia associated with type 2 diabetes mellitus ([place]) 04/07/2018   Mixed stress and urge urinary incontinence 02/25/2017   Right sided weakness 09/26/2016   Asthma 11/15/2015   Essential hypertension 10/20/2014   Diabetes mellitus associated with hormonal etiology ([place]) 10/20/2014   Celiac sprue 10/20/2014   BMI 40.0-44.9, adult ([place]) 10/20/2014   Fatty infiltration of liver 08/31/2014    PCP: Park Liter, DO  REFERRING PROVIDER: Valerie Roys, DO   REFERRING DIAG:  N39.46 (ICD-10-CM) - Mixed stress and urge urinary incontinence   THERAPY DIAG:  Pelvic floor dysfunction  Other lack of coordination  Muscle weakness (generalized)  Rationale for Evaluation and Treatment: Rehabilitation  ONSET DATE: 5 years ago   PRECAUTIONS: None  WEIGHT BEARING RESTRICTIONS: No  FALLS:  Has patient fallen in last 6 months? 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 Pt has lost weight but noticed the leakage was heavier with increased weight. Pt has tried to pinpoint when the leakage is worse (with certain food or exercise) but cannot figure it out. Pt was taking one BP medication that encouraged more fluid removal but Dr has since changed it and leakage has been better but is still present and limits her. Pt tries not to lift anything because she knows she will leak. Pt hasn't  noticed too much leakage with walking but still is present. Pt has a hx of intense core exercises. Pt does have COPD and cannot lie flat for very long, Pt sleeps with multiple pillows and feels her COPD is well managed. Walking 7 days a week (2-3 miles), and working with a Physiological scientist 3x per week, a lot of strength training. Has to wear a Level 3 pad with any physical activity.    PATIENT GOALS: Pt would like to stop the leakage and not wear pads,  and strengthen parts of her body that she is not aware of     UROLOGICAL HISTORY Fluid intake: Yes: water all day w/lemon/limes, diet soft drink very occasionally   Pain with urination: No Fully empty bladder: Yes Stream:  sitting/but does stop and go  Urgency: Yes  Frequency: 6-10x/day  Nocturia: 1x Leakage: Walking to the bathroom, Exercise, Lifting, Bending forward, and Intercourse Pads: Yes Type: incontinence pads, #2, when out in the community #3  Amount: 6-8x/day  Bladder control (0-10): 4/10   SEXUAL HISTORY/FUNCTION  Pain with intercourse: No Able to achieve orgasm?: Yes  OBSTETRICAL HISTORY Vaginal deliveries: G4P4 Tearing: Yes: stitches with all of them    GYNECOLOGICAL HISTORY Hysterectomy: yes, abdominal hysterectomy  Pelvic Organ Prolapse: None Pain with exam: no  Heaviness/pressure: no   SUBJECTIVE: Pt has been enjoying PFM lengthening techniques and feels like it helps a lot.    PAIN:  Are you having pain? No    OBJECTIVE:    COGNITION: (11/01/21) Overall cognitive status: Within functional limits for tasks assessed     POSTURE:  In seated B plantarflexion with B rounded shoulders    Thoracic kyphosis: slightly in standing  Iliac crest height: L iliac crest  Pelvic obliquity: L posteriorly rotated    GAIT: Trendelenburg: positive on L    RANGE OF MOTION:    (Norm range in degrees)  LEFT 11/01/21 RIGHT 11/01/21  Lumbar forward flexion (65):  WNL    Lumbar extension (30): WNL    Lumbar lateral flexion (25):  Restricted (above knee) Restricted (above knee)  Thoracic and Lumbar rotation (30 degrees):    WNL WNL  Hip Flexion (0-125):   WNL WNL  Hip IR (0-45):  Restricted Restricted  Hip ER (0-45):  WNL WNL  Hip Adduction:      Hip Abduction (0-40):  WNL WNL  Hip extension (0-15):     (*= pain, Blank rows = not tested)   STRENGTH: MMT    RLE 11/08/21 LLE 11/08/21  Hip Flexion 5 5  Hip Extension 4 4  Hip Abduction  4 4  Hip  Adduction     Hip ER  5 5  Hip IR  5 5  Knee Extension 5 5  Knee Flexion 4 4  Dorsiflexion     Plantarflexion (seated) 5 5  (*= pain, Blank rows = not tested)   SPECIAL TESTS:  FABER (SN 81): negative FADIR (SN 94): negative   PALPATION:  Abdominal:  Diastasis:  none Scar mobility: none   EXTERNAL PELVIC EXAM: Patient educated on the purpose of the pelvic exam and articulated understanding; patient consented to the exam verbally.  Breath coordination: present but inconsistent  Voluntary Contraction: present, 2/5 MMT after significant cueing, 1st attempt activated gluteals and abdominals Relaxation: delayed Perineal movement with sustained IAP increase ("bear down"): elevation with gluteal activation and Valsalva Perineal movement with rapid IAP increase ("cough"): no change (0= no contraction, 1= flicker, 2= weak squeeze, 3= fair  squeeze with lift, 4= good squeeze and lift against resistance, 5= strong squeeze against strong resistance)    TODAY'S TREATMENT  Neuromuscular Re-education: Reassessment: LTGs and STGs - see below  Posture Iliac crest height: L iliac crest slightly elevated but different by IE Pelvic obliquity: WNL (IE: posteriorly rotated)   EXTERNAL PELVIC EXAM: Patient educated on the purpose of the pelvic exam and articulated understanding; patient consented to the exam verbally.  Breath coordination: present  Voluntary Contraction: present, 2/5 MMT after 1st cue to deactivate the glutes, improved   Relaxation: full Perineal movement with sustained IAP increase ("bear down"): descent after cueing but Valsalva noted Perineal movement with rapid IAP increase ("cough"): no change (0= no contraction, 1= flicker, 2= weak squeeze, 3= fair squeeze with lift, 4= good squeeze and lift against resistance, 5= strong squeeze against strong resistance)   Discussion on toileting posture specifically keeping knees hip width apart and going to seated diaphragmatic  breathing to decrease straining and to decrease risk of pelvic organ prolapse. Pt practiced with squatty potty.   Patient response to interventions: Pt feels she needs more appointments going forward but definitely notices an improvement from the very first session.   Patient Education:  Patient provided with no change to HEP. Patient educated throughout session on appropriate technique and form using multi-modal cueing, HEP, and activity modification. Patient will benefit from further education in order to maximize compliance and understanding for long-term therapeutic gains.     ASSESSMENT:  Clinical Impression: Patient presents to clinic with excellent motivation to participate in today's session. Pt continues to demonstrate deficits in IAP management, PFM strength, PFM coordination, LE strength, ROM and posture. Today's reassessment demonstrated significant improvement from IE on 11/01/21. FOTO Urinary Problem demonstrated a 10 point change (62) from IE (52) and based on Pt responses urinary leakage has minimized significantly. LTGs and STGs were reviewed and Pt does not have urinary leakage with most activities (modified exercise, lifting, bending, sneezing) but still with intercourse. Pt only changes incontinence pad 3x per day now for hygiene but only 1 pad is soiled throughout the day. Pt's confidence has also increased 8/10 since IE (4/10). Although Pt has demonstrated significant improvement, Pt will continue to benefit from skilled therapeutic intervention to address deficits in IAP management, PFM strength, PFM coordination, PFM endurance, ROM and posture in order to increase confidence in bladder, minimize incontinence pad use, and increase PLOF and improve overall QOL.    Objective Impairments: decreased activity tolerance, decreased coordination, decreased endurance, decreased ROM, decreased strength, improper body mechanics, and postural dysfunction.   Activity Limitations:  carrying, lifting, bending, squatting, continence, toileting, and locomotion level  Personal Factors: Age, Behavior pattern, Fitness, Past/current experiences, Time since onset of injury/illness/exacerbation, and 3+ comorbidities: COPD, HTN, diabetes  are also affecting patient's functional outcome.   Rehab Potential: Good  Clinical Decision Making: Evolving/moderate complexity  Evaluation Complexity: Moderate   GOALS: Goals reviewed with patient? Yes  SHORT TERM GOALS: Target date: 02/14/2022  Patient will demonstrate independence with HEP in order to maximize therapeutic gains and improve carryover from physical therapy sessions to ADLs in the home and community. Baseline: toileting posture handout, (8/17): continues to perform HEP requiring cueing in session; (9/14): Pt is IND with HEP Goal status: MET    LONG TERM GOALS: Target date:  03/06/22  Patient will score >/= 60 on FOTO Urinary Problem  in order to demonstrate improved IAP management, improved PFM coordination, and overall improved QOL.  Baseline: 52, (8/17): 56, (  9/14): 62 Goal status: MET  2.  Patient will report decreased reliance on protective undergarments as indicated by a 24 hour period to demonstrate improved bladder control and allow for increased participation in activities outside of the home. Baseline: Lvl 2 (normally) and Lvl 3 (exercise) incontinence pads about 6-8x/day, (8/17): Lvl 3 (outside) changing 2-3x/day, and does not wear pad but still has to change 1x but not everyday; (9/14): Lvl 2 during the day and Lvl 3 out in the community 3x/day for hygiene but only 1 pad on average is wet  Goal status: IN PROGRESS  3.  Patient will report less than 5 incidents of stress urinary incontinence over the course of 3 weeks while lifting/bending/squatting/prolonged activity (exercise) in order to demonstrate improved PFM coordination, strength, and function for improved overall QOL. Baseline: leakage with all the  above every time; (8/17): leakage still with deep squat but not lifting/bending; (9/14): Pt removed deep squats from routine but even with wall squats Pt is not leaking, no leakage with lifting/bending  Goal status: MET  4.  Patient will report being able to return to activities including, but not limited to: physical activity, long walk, dancing, sexual intercourse without leakage or limitation to indicate complete resolution of the chief concern and return to prior level of participation at home and in the community. Baseline: unable to participate in activities due to leakage and apprehension; (8/17): dancing no leakage, apprehensive with intercourse; (9/14): dancing no leakage, with intercourse only a dribble if that Goal status: IN PROGRESS  5.  Patient will report confidence in ability to control bladder > 7/10 in order to demonstrate improved function and ability to participate more fully in activities at home and in the community. Baseline: 4/10; (8/17): 7/10; (9/14): 8/10 Goal status: MET   PLAN: PT Frequency: 1x/week  PT Duration: 12 weeks  Planned Interventions: Therapeutic exercises, Therapeutic activity, Neuromuscular re-education, Balance training, Gait training, Patient/Family education, Self Care, Joint mobilization, Spinal mobilization, Cryotherapy, Moist heat, scar mobilization, Taping, and Manual therapy  Plan For Next Session:  go over squats/planks/bicep curls/gym ex that Pt practices, deep core in standing and breathing   Srishti Strnad, PT, DPT  01/03/2022, 9:09 AM

## 2022-01-17 ENCOUNTER — Ambulatory Visit: Payer: 59

## 2022-01-17 DIAGNOSIS — M6289 Other specified disorders of muscle: Secondary | ICD-10-CM

## 2022-01-17 DIAGNOSIS — R278 Other lack of coordination: Secondary | ICD-10-CM

## 2022-01-17 DIAGNOSIS — M6281 Muscle weakness (generalized): Secondary | ICD-10-CM

## 2022-01-17 NOTE — Therapy (Signed)
OUTPATIENT PHYSICAL THERAPY FEMALE PELVIC TREATMENT   Patient Name: [name] MRN: [id number] DOB:[birth year], 66 y.o., female Today's Date: 01/17/2022   PT End of Session - 01/17/22 0757     Visit Number 11    Number of Visits 20    Date for PT Re-Evaluation 03/06/22    Authorization Type IE: 11/01/21, PN/RC: 01/03/22    PT Start Time 0800    PT Stop Time 0840    PT Time Calculation (min) 40 min    Activity Tolerance Patient tolerated treatment well             Past Medical History:  Diagnosis Date   Asthma    Depression    Diabetes mellitus without complication ([place])    Hyperlipidemia    Kidney stones    Migraine headache    Past Surgical History:  Procedure Laterality Date   ABDOMINAL HYSTERECTOMY     BREAST CYST ASPIRATION Right 1981   benign   BREAST CYST ASPIRATION Right 1984   benign   CHOLECYSTECTOMY     COLONOSCOPY  March 2016   FLEXIBLE BRONCHOSCOPY N/A 01/26/2018   Procedure: FLEXIBLE BRONCHOSCOPY;  Surgeon: Laverle Hobby, MD;  Location: [facility] ORS;  Service: Pulmonary;  Laterality: N/A;   MENISCUS REPAIR Left 09/18/2018   OOPHORECTOMY     PARTIAL KNEE ARTHROPLASTY Left 02/2019   Patient Active Problem List   Diagnosis Date Noted   Statin myopathy 10/16/2021   Obesity (BMI 35.0-39.9 without comorbidity) 06/13/2021   COPD (chronic obstructive pulmonary disease) ([place]) 01/19/2021   Morbid obesity ([place]) 01/19/2021   Gastroesophageal reflux disease without esophagitis 01/19/2021   Absence of bladder continence 11/30/2020   Recurrent major depressive disorder, in full remission ([place]) 11/30/2020   Chronic venous insufficiency 10/05/2020   Pain and swelling of lower leg 09/18/2020   Raynauds phenomenon 09/18/2020   Hyperlipidemia 09/18/2020   COVID-19 04/30/2020   History of total knee arthroplasty 09/08/2019   Vitamin D deficiency 12/03/2018   Knee arthropathy 07/28/2018   Anxiety 04/07/2018   Hyperlipidemia associated with type 2  diabetes mellitus ([place]) 04/07/2018   Mixed stress and urge urinary incontinence 02/25/2017   Right sided weakness 09/26/2016   Asthma 11/15/2015   Essential hypertension 10/20/2014   Diabetes mellitus associated with hormonal etiology ([place]) 10/20/2014   Celiac sprue 10/20/2014   BMI 40.0-44.9, adult ([place]) 10/20/2014   Fatty infiltration of liver 08/31/2014    PCP: Park Liter, DO  REFERRING PROVIDER: Valerie Roys, DO   REFERRING DIAG:  N39.46 (ICD-10-CM) - Mixed stress and urge urinary incontinence   THERAPY DIAG:  Pelvic floor dysfunction  Other lack of coordination  Muscle weakness (generalized)  Rationale for Evaluation and Treatment: Rehabilitation  ONSET DATE: 5 years ago   PRECAUTIONS: None  WEIGHT BEARING RESTRICTIONS: No  FALLS:  Has patient fallen in last 6 months? 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 Pt has lost weight but noticed the leakage was heavier with increased weight. Pt has tried to pinpoint when the leakage is worse (with certain food or exercise) but cannot figure it out. Pt was taking one BP medication that encouraged more fluid removal but Dr has since changed it and leakage has been better but is still present and limits her. Pt tries not to lift anything because she knows she will leak. Pt hasn't noticed too much leakage  with walking but still is present. Pt has a hx of intense core exercises. Pt does have COPD and cannot lie flat for very long, Pt sleeps with multiple pillows and feels her COPD is well managed. Walking 7 days a week (2-3 miles), and working with a Physiological scientist 3x per week, a lot of strength training. Has to wear a Level 3 pad with any physical activity.    PATIENT GOALS: Pt would like to stop the leakage and not wear pads, and strengthen parts of her body that  she is not aware of     UROLOGICAL HISTORY Fluid intake: Yes: water all day w/lemon/limes, diet soft drink very occasionally   Pain with urination: No Fully empty bladder: Yes Stream:  sitting/but does stop and go  Urgency: Yes  Frequency: 6-10x/day  Nocturia: 1x Leakage: Walking to the bathroom, Exercise, Lifting, Bending forward, and Intercourse Pads: Yes Type: incontinence pads, #2, when out in the community #3  Amount: 6-8x/day  Bladder control (0-10): 4/10   SEXUAL HISTORY/FUNCTION  Pain with intercourse: No Able to achieve orgasm?: Yes  OBSTETRICAL HISTORY Vaginal deliveries: G4P4 Tearing: Yes: stitches with all of them    GYNECOLOGICAL HISTORY Hysterectomy: yes, abdominal hysterectomy  Pelvic Organ Prolapse: None Pain with exam: no  Heaviness/pressure: no   SUBJECTIVE: Pt has been doing ok over the past couple weeks though Pt feels her leakage has been "worse" over the past few weeks. Pt did realize she was drinking more alcoholic beverages and carbonated beverages over her vacation and noticed more leakage.    PAIN:  Are you having pain? No    OBJECTIVE:    COGNITION: (11/01/21) Overall cognitive status: Within functional limits for tasks assessed     POSTURE:  In seated B plantarflexion with B rounded shoulders    Thoracic kyphosis: slightly in standing  Iliac crest height: L iliac crest  Pelvic obliquity: L posteriorly rotated    GAIT: Trendelenburg: positive on L    RANGE OF MOTION:    (Norm range in degrees)  LEFT 11/01/21 RIGHT 11/01/21  Lumbar forward flexion (65):  WNL    Lumbar extension (30): WNL    Lumbar lateral flexion (25):  Restricted (above knee) Restricted (above knee)  Thoracic and Lumbar rotation (30 degrees):    WNL WNL  Hip Flexion (0-125):   WNL WNL  Hip IR (0-45):  Restricted Restricted  Hip ER (0-45):  WNL WNL  Hip Adduction:      Hip Abduction (0-40):  WNL WNL  Hip extension (0-15):     (*= pain, Blank rows = not  tested)   STRENGTH: MMT    RLE 11/08/21 LLE 11/08/21  Hip Flexion 5 5  Hip Extension 4 4  Hip Abduction  4 4  Hip Adduction     Hip ER  5 5  Hip IR  5 5  Knee Extension 5 5  Knee Flexion 4 4  Dorsiflexion     Plantarflexion (seated) 5 5  (*= pain, Blank rows = not tested)   SPECIAL TESTS:  FABER (SN 81): negative FADIR (SN 94): negative   PALPATION:  Abdominal:  Diastasis:  none Scar mobility: none   EXTERNAL PELVIC EXAM: Patient educated on the purpose of the pelvic exam and articulated understanding; patient consented to the exam verbally.  Breath coordination: present but inconsistent  Voluntary Contraction: present, 2/5 MMT after significant cueing, 1st attempt activated gluteals and abdominals Relaxation: delayed Perineal movement with sustained IAP increase ("bear down"): elevation  with gluteal activation and Valsalva Perineal movement with rapid IAP increase ("cough"): no change (0= no contraction, 1= flicker, 2= weak squeeze, 3= fair squeeze with lift, 4= good squeeze and lift against resistance, 5= strong squeeze against strong resistance)    TODAY'S TREATMENT  Neuromuscular Re-education: Discussion on bladder irritants and how that can impact PFM coordination and urinary leakage. Pt given handout and verbalized understanding.   Discussion on sympathetic nervous system and how negative self-talk can upregulate the nervous system affecting PFM coordination.   Supine hooklying diaphragmatic breathing with VCs and TCs for downregulation of the nervous system and improved management of IAP  Discussion on decreasing gluteal activation throughout the day as Pt with increased activation during PFM strengthening but improved with verbal cueing.   Seated piriformis pose for improved PFM lengthening and tissue extensibility.   PFM strengthening - 2/5 MMT consistent over 3 quick flicks  Breath coordination present over assessment    Patient response to  interventions: Pt with some urinary leakage during PFM strengthening (end of session) - wearing Lvl 4 incontinence pad   Patient Education:  Patient provided HEP: seated piriformis, PFM strength, bladder irritants handout. Patient educated throughout session on appropriate technique and form using multi-modal cueing, HEP, and activity modification. Patient will benefit from further education in order to maximize compliance and understanding for long-term therapeutic gains.     ASSESSMENT:  Clinical Impression: Patient presents to clinic with excellent motivation to participate in today's session. Pt continues to demonstrate deficits in IAP management, PFM strength, PFM coordination, LE strength, ROM and posture. Pt reports an increase in urinary leakage over the past few weeks and wearing a Lvl 4 incontinence pad. After some discussion, on average Pt has improved significantly and consumed more bladder irritants than normal over the past few weeks. Discussion on decreasing gluteal activation throughout the day as that can increase PFM tension. After assessment of PFM strength, Pt able to maintain 2/5 MMT over 3 quick flicks and diaphragmatic breathing in between contractions. Pt required moderate VCs and TCs on proper technique and avoiding repeated PFM contractions as they can fatigue quickly. Pt responded well to active and educational interventions. Pt will continue to benefit from skilled therapeutic intervention to address deficits in IAP management, PFM strength, PFM coordination, PFM endurance, ROM and posture in order to increase confidence in bladder, minimize incontinence pad use, and increase PLOF and improve overall QOL.    Objective Impairments: decreased activity tolerance, decreased coordination, decreased endurance, decreased ROM, decreased strength, improper body mechanics, and postural dysfunction.   Activity Limitations: carrying, lifting, bending, squatting, continence,  toileting, and locomotion level  Personal Factors: Age, Behavior pattern, Fitness, Past/current experiences, Time since onset of injury/illness/exacerbation, and 3+ comorbidities: COPD, HTN, diabetes  are also affecting patient's functional outcome.   Rehab Potential: Good  Clinical Decision Making: Evolving/moderate complexity  Evaluation Complexity: Moderate   GOALS: Goals reviewed with patient? Yes  SHORT TERM GOALS: Target date: 02/14/2022  Patient will demonstrate independence with HEP in order to maximize therapeutic gains and improve carryover from physical therapy sessions to ADLs in the home and community. Baseline: toileting posture handout, (8/17): continues to perform HEP requiring cueing in session; (9/14): Pt is IND with HEP Goal status: MET    LONG TERM GOALS: Target date:  03/06/22  Patient will score >/= 60 on FOTO Urinary Problem  in order to demonstrate improved IAP management, improved PFM coordination, and overall improved QOL.  Baseline: 52, (8/17): 56, (9/14): 62 Goal status:  MET  2.  Patient will report decreased reliance on protective undergarments as indicated by a 24 hour period to demonstrate improved bladder control and allow for increased participation in activities outside of the home. Baseline: Lvl 2 (normally) and Lvl 3 (exercise) incontinence pads about 6-8x/day, (8/17): Lvl 3 (outside) changing 2-3x/day, and does not wear pad but still has to change 1x but not everyday; (9/14): Lvl 2 during the day and Lvl 3 out in the community 3x/day for hygiene but only 1 pad on average is wet  Goal status: IN PROGRESS  3.  Patient will report less than 5 incidents of stress urinary incontinence over the course of 3 weeks while lifting/bending/squatting/prolonged activity (exercise) in order to demonstrate improved PFM coordination, strength, and function for improved overall QOL. Baseline: leakage with all the above every time; (8/17): leakage still with deep  squat but not lifting/bending; (9/14): Pt removed deep squats from routine but even with wall squats Pt is not leaking, no leakage with lifting/bending  Goal status: MET  4.  Patient will report being able to return to activities including, but not limited to: physical activity, long walk, dancing, sexual intercourse without leakage or limitation to indicate complete resolution of the chief concern and return to prior level of participation at home and in the community. Baseline: unable to participate in activities due to leakage and apprehension; (8/17): dancing no leakage, apprehensive with intercourse; (9/14): dancing no leakage, with intercourse only a dribble if that Goal status: IN PROGRESS  5.  Patient will report confidence in ability to control bladder > 7/10 in order to demonstrate improved function and ability to participate more fully in activities at home and in the community. Baseline: 4/10; (8/17): 7/10; (9/14): 8/10 Goal status: MET   PLAN: PT Frequency: 1x/week  PT Duration: 12 weeks  Planned Interventions: Therapeutic exercises, Therapeutic activity, Neuromuscular re-education, Balance training, Gait training, Patient/Family education, Self Care, Joint mobilization, Spinal mobilization, Cryotherapy, Moist heat, scar mobilization, Taping, and Manual therapy  Plan For Next Session:   go over squats/planks/bicep curls/gym ex that Pt practices, deep core in standing and breathing, how was PFM strength   Zulema [place]aski, PT, DPT  01/17/2022, 7:57 AM

## 2022-01-24 ENCOUNTER — Ambulatory Visit: Payer: 59 | Attending: Family Medicine

## 2022-01-24 DIAGNOSIS — M6289 Other specified disorders of muscle: Secondary | ICD-10-CM | POA: Diagnosis present

## 2022-01-24 DIAGNOSIS — M6281 Muscle weakness (generalized): Secondary | ICD-10-CM | POA: Diagnosis present

## 2022-01-24 DIAGNOSIS — R278 Other lack of coordination: Secondary | ICD-10-CM

## 2022-01-24 NOTE — Therapy (Addendum)
OUTPATIENT PHYSICAL THERAPY FEMALE PELVIC TREATMENT   Patient Name: [name] MRN: [id number] DOB:[birth year], 66 y.o., female Today's Date: 01/24/2022   PT End of Session - 01/24/22 0802     Visit Number 12    Number of Visits 20    Date for PT Re-Evaluation 03/06/22    Authorization Type IE: 11/01/21, PN/RC: 01/03/22    PT Start Time 0802    PT Stop Time 0840    PT Time Calculation (min) 38 min    Activity Tolerance Patient tolerated treatment well             Past Medical History:  Diagnosis Date   Asthma    Depression    Diabetes mellitus without complication ([place])    Hyperlipidemia    Kidney stones    Migraine headache    Past Surgical History:  Procedure Laterality Date   ABDOMINAL HYSTERECTOMY     BREAST CYST ASPIRATION Right 1981   benign   BREAST CYST ASPIRATION Right 1984   benign   CHOLECYSTECTOMY     COLONOSCOPY  March 2016   FLEXIBLE BRONCHOSCOPY N/A 01/26/2018   Procedure: FLEXIBLE BRONCHOSCOPY;  Surgeon: Laverle Hobby, MD;  Location: [facility] ORS;  Service: Pulmonary;  Laterality: N/A;   MENISCUS REPAIR Left 09/18/2018   OOPHORECTOMY     PARTIAL KNEE ARTHROPLASTY Left 02/2019   Patient Active Problem List   Diagnosis Date Noted   Statin myopathy 10/16/2021   Obesity (BMI 35.0-39.9 without comorbidity) 06/13/2021   COPD (chronic obstructive pulmonary disease) ([place]) 01/19/2021   Morbid obesity ([place]) 01/19/2021   Gastroesophageal reflux disease without esophagitis 01/19/2021   Absence of bladder continence 11/30/2020   Recurrent major depressive disorder, in full remission ([place]) 11/30/2020   Chronic venous insufficiency 10/05/2020   Pain and swelling of lower leg 09/18/2020   Raynauds phenomenon 09/18/2020   Hyperlipidemia 09/18/2020   COVID-19 04/30/2020   History of total knee arthroplasty 09/08/2019   Vitamin D deficiency 12/03/2018   Knee arthropathy 07/28/2018   Anxiety 04/07/2018   Hyperlipidemia associated with type 2  diabetes mellitus ([place]) 04/07/2018   Mixed stress and urge urinary incontinence 02/25/2017   Right sided weakness 09/26/2016   Asthma 11/15/2015   Essential hypertension 10/20/2014   Diabetes mellitus associated with hormonal etiology ([place]) 10/20/2014   Celiac sprue 10/20/2014   BMI 40.0-44.9, adult ([place]) 10/20/2014   Fatty infiltration of liver 08/31/2014    PCP: Park Liter, DO  REFERRING PROVIDER: Valerie Roys, DO   REFERRING DIAG:  N39.46 (ICD-10-CM) - Mixed stress and urge urinary incontinence   THERAPY DIAG:  Pelvic floor dysfunction  Other lack of coordination  Muscle weakness (generalized)  Rationale for Evaluation and Treatment: Rehabilitation  ONSET DATE: 5 years ago   PRECAUTIONS: None  WEIGHT BEARING RESTRICTIONS: No  FALLS:  Has patient fallen in last 6 months? 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 Pt has lost weight but noticed the leakage was heavier with increased weight. Pt has tried to pinpoint when the leakage is worse (with certain food or exercise) but cannot figure it out. Pt was taking one BP medication that encouraged more fluid removal but Dr has since changed it and leakage has been better but is still present and limits her. Pt tries not to lift anything because she knows she will leak. Pt hasn't noticed too much leakage  with walking but still is present. Pt has a hx of intense core exercises. Pt does have COPD and cannot lie flat for very long, Pt sleeps with multiple pillows and feels her COPD is well managed. Walking 7 days a week (2-3 miles), and working with a Physiological scientist 3x per week, a lot of strength training. Has to wear a Level 3 pad with any physical activity.    PATIENT GOALS: Pt would like to stop the leakage and not wear pads, and strengthen parts of her body that  she is not aware of     UROLOGICAL HISTORY Fluid intake: Yes: water all day w/lemon/limes, diet soft drink very occasionally   Pain with urination: No Fully empty bladder: Yes Stream:  sitting/but does stop and go  Urgency: Yes  Frequency: 6-10x/day  Nocturia: 1x Leakage: Walking to the bathroom, Exercise, Lifting, Bending forward, and Intercourse Pads: Yes Type: incontinence pads, #2, when out in the community #3  Amount: 6-8x/day  Bladder control (0-10): 4/10   SEXUAL HISTORY/FUNCTION  Pain with intercourse: No Able to achieve orgasm?: Yes  OBSTETRICAL HISTORY Vaginal deliveries: G4P4 Tearing: Yes: stitches with all of them    GYNECOLOGICAL HISTORY Hysterectomy: yes, abdominal hysterectomy  Pelvic Organ Prolapse: None Pain with exam: no  Heaviness/pressure: no   SUBJECTIVE: Pt has not been having as much leakage this week, compared to the week before. Pt is not having urinary leakage during the day but noticing it more with physical activity. Pt has noticed more urinary leakage in the morning on the way to work and    PAIN:  Are you having pain? No    OBJECTIVE:    COGNITION: (11/01/21) Overall cognitive status: Within functional limits for tasks assessed     POSTURE:  In seated B plantarflexion with B rounded shoulders    Thoracic kyphosis: slightly in standing  Iliac crest height: L iliac crest  Pelvic obliquity: L posteriorly rotated    GAIT: Trendelenburg: positive on L    RANGE OF MOTION:    (Norm range in degrees)  LEFT 11/01/21 RIGHT 11/01/21  Lumbar forward flexion (65):  WNL    Lumbar extension (30): WNL    Lumbar lateral flexion (25):  Restricted (above knee) Restricted (above knee)  Thoracic and Lumbar rotation (30 degrees):    WNL WNL  Hip Flexion (0-125):   WNL WNL  Hip IR (0-45):  Restricted Restricted  Hip ER (0-45):  WNL WNL  Hip Adduction:      Hip Abduction (0-40):  WNL WNL  Hip extension (0-15):     (*= pain, Blank rows =  not tested)   STRENGTH: MMT    RLE 11/08/21 LLE 11/08/21  Hip Flexion 5 5  Hip Extension 4 4  Hip Abduction  4 4  Hip Adduction     Hip ER  5 5  Hip IR  5 5  Knee Extension 5 5  Knee Flexion 4 4  Dorsiflexion     Plantarflexion (seated) 5 5  (*= pain, Blank rows = not tested)   SPECIAL TESTS:  FABER (SN 81): negative FADIR (SN 94): negative   PALPATION:  Abdominal:  Diastasis:  none Scar mobility: none   EXTERNAL PELVIC EXAM: Patient educated on the purpose of the pelvic exam and articulated understanding; patient consented to the exam verbally.  Breath coordination: present but inconsistent  Voluntary Contraction: present, 2/5 MMT after significant cueing, 1st attempt activated gluteals and abdominals Relaxation: delayed Perineal movement with sustained IAP  increase ("bear down"): elevation with gluteal activation and Valsalva Perineal movement with rapid IAP increase ("cough"): no change (0= no contraction, 1= flicker, 2= weak squeeze, 3= fair squeeze with lift, 4= good squeeze and lift against resistance, 5= strong squeeze against strong resistance)    TODAY'S TREATMENT  Neuromuscular Re-education: Discussion on how increased tension or upregulation of the sympathetic nervous system can affect PFM coordination and strength as with continued interview Pt notices more leakage when she holds more tension or is in a rush. DPT offered a solution of taking a few mins for diaphragmatic breathing before driving to aid in activating parasympathetic nervous system.   Discussion on the connection between the glottis and pelvic floor and offering a solution of singing during car rides to allow opening and promote downregulation of the nervous system.   Seated overhead press with 8# for improved IAP management and proper body mechanics; however, Pt unable to maintain proper mechanics nor breathing  5# dumbbell used and performed significantly better paired with breath  Practiced  seated and standing bicep curls with 5#  Standing RDL with flys and coordinated breathing for improved IAP management, 5# dumbbell used and VCs and TCs required    Patient response to interventions: Pt felt comfortable with 5lb free weights.   Patient Education:  Patient provided HEP: no change or adds. Patient educated throughout session on appropriate technique and form using multi-modal cueing, HEP, and activity modification. Patient will benefit from further education in order to maximize compliance and understanding for long-term therapeutic gains.     ASSESSMENT:  Clinical Impression: Patient presents to clinic with excellent motivation to participate in today's session. Pt continues to demonstrate deficits in IAP management, PFM strength, PFM coordination, LE strength, ROM and posture. Pt reports return to Lvl 2-3 incontinence pads and felt that some bladder irritants were causing increased leakage. Time taken to discuss downregulation of the sympathetic nervous system via breathing techniques. Pt verbalized understanding. Most of session focused on modifications of physical activity and coordinating movement with breath as Pt observed performing Valsalva maneuver with heavier free weights (8lbs). Discussion on quality vs. quantity approach to increased physical activity and modifications to avoid constant urinary leakage during personal training days. Pt required moderate VCs and TCs on proper technique and avoiding bodily compensations (increased hip adduction in seated and Valsalva maneuver). After increased time, Pt able to demonstrate understanding and ability to perform modified activities with 5lb free weights and coordinated breath with no urinary leakage during session. Pt responded well to active and educational interventions. Pt will continue to benefit from skilled therapeutic intervention to address deficits in IAP management, PFM strength, PFM coordination, PFM endurance, ROM  and posture in order to increase confidence in bladder, minimize incontinence pad use, and increase PLOF and improve overall QOL.    Objective Impairments: decreased activity tolerance, decreased coordination, decreased endurance, decreased ROM, decreased strength, improper body mechanics, and postural dysfunction.   Activity Limitations: carrying, lifting, bending, squatting, continence, toileting, and locomotion level  Personal Factors: Age, Behavior pattern, Fitness, Past/current experiences, Time since onset of injury/illness/exacerbation, and 3+ comorbidities: COPD, HTN, diabetes  are also affecting patient's functional outcome.   Rehab Potential: Good  Clinical Decision Making: Evolving/moderate complexity  Evaluation Complexity: Moderate   GOALS: Goals reviewed with patient? Yes  SHORT TERM GOALS: Target date: 02/14/2022  Patient will demonstrate independence with HEP in order to maximize therapeutic gains and improve carryover from physical therapy sessions to ADLs in the home and community. Baseline:  toileting posture handout, (8/17): continues to perform HEP requiring cueing in session; (9/14): Pt is IND with HEP Goal status: MET    LONG TERM GOALS: Target date:  03/06/22  Patient will score >/= 60 on FOTO Urinary Problem  in order to demonstrate improved IAP management, improved PFM coordination, and overall improved QOL.  Baseline: 52, (8/17): 56, (9/14): 62 Goal status: MET  2.  Patient will report decreased reliance on protective undergarments as indicated by a 24 hour period to demonstrate improved bladder control and allow for increased participation in activities outside of the home. Baseline: Lvl 2 (normally) and Lvl 3 (exercise) incontinence pads about 6-8x/day, (8/17): Lvl 3 (outside) changing 2-3x/day, and does not wear pad but still has to change 1x but not everyday; (9/14): Lvl 2 during the day and Lvl 3 out in the community 3x/day for hygiene but only 1 pad  on average is wet  Goal status: IN PROGRESS  3.  Patient will report less than 5 incidents of stress urinary incontinence over the course of 3 weeks while lifting/bending/squatting/prolonged activity (exercise) in order to demonstrate improved PFM coordination, strength, and function for improved overall QOL. Baseline: leakage with all the above every time; (8/17): leakage still with deep squat but not lifting/bending; (9/14): Pt removed deep squats from routine but even with wall squats Pt is not leaking, no leakage with lifting/bending  Goal status: MET  4.  Patient will report being able to return to activities including, but not limited to: physical activity, long walk, dancing, sexual intercourse without leakage or limitation to indicate complete resolution of the chief concern and return to prior level of participation at home and in the community. Baseline: unable to participate in activities due to leakage and apprehension; (8/17): dancing no leakage, apprehensive with intercourse; (9/14): dancing no leakage, with intercourse only a dribble if that Goal status: IN PROGRESS  5.  Patient will report confidence in ability to control bladder > 7/10 in order to demonstrate improved function and ability to participate more fully in activities at home and in the community. Baseline: 4/10; (8/17): 7/10; (9/14): 8/10 Goal status: MET   PLAN: PT Frequency: 1x/week  PT Duration: 12 weeks  Planned Interventions: Therapeutic exercises, Therapeutic activity, Neuromuscular re-education, Balance training, Gait training, Patient/Family education, Self Care, Joint mobilization, Spinal mobilization, Cryotherapy, Moist heat, scar mobilization, Taping, and Manual therapy  Plan For Next Session:   workout session (see your sticky note) with breath    Ruchi Stoney, PT, DPT  01/24/2022, 8:04 AM

## 2022-01-31 ENCOUNTER — Ambulatory Visit: Payer: 59

## 2022-01-31 DIAGNOSIS — M6289 Other specified disorders of muscle: Secondary | ICD-10-CM

## 2022-01-31 DIAGNOSIS — M6281 Muscle weakness (generalized): Secondary | ICD-10-CM

## 2022-01-31 DIAGNOSIS — R278 Other lack of coordination: Secondary | ICD-10-CM

## 2022-01-31 NOTE — Therapy (Signed)
OUTPATIENT PHYSICAL THERAPY FEMALE PELVIC TREATMENT   Patient Name: [name] MRN: [id number] DOB:04/12/1956, 66 y.o., female Today's Date: 01/31/2022   PT End of Session - 01/31/22 0758     Visit Number 13    Number of Visits 20    Date for PT Re-Evaluation 03/06/22    Authorization Type IE: 11/01/21, PN/RC: 01/03/22    PT Start Time 0800    PT Stop Time 0850    PT Time Calculation (min) 50 min    Activity Tolerance Patient tolerated treatment well             Past Medical History:  Diagnosis Date   Asthma    Depression    Diabetes mellitus without complication ([place])    Hyperlipidemia    Kidney stones    Migraine headache    Past Surgical History:  Procedure Laterality Date   ABDOMINAL HYSTERECTOMY     BREAST CYST ASPIRATION Right 1981   benign   BREAST CYST ASPIRATION Right 1984   benign   CHOLECYSTECTOMY     COLONOSCOPY  March 2016   FLEXIBLE BRONCHOSCOPY N/A 01/26/2018   Procedure: FLEXIBLE BRONCHOSCOPY;  Surgeon: Laverle Hobby, MD;  Location: [facility] ORS;  Service: Pulmonary;  Laterality: N/A;   MENISCUS REPAIR Left 09/18/2018   OOPHORECTOMY     PARTIAL KNEE ARTHROPLASTY Left 02/2019   Patient Active Problem List   Diagnosis Date Noted   Statin myopathy 10/16/2021   Obesity (BMI 35.0-39.9 without comorbidity) 06/13/2021   COPD (chronic obstructive pulmonary disease) ([place]) 01/19/2021   Morbid obesity ([place]) 01/19/2021   Gastroesophageal reflux disease without esophagitis 01/19/2021   Absence of bladder continence 11/30/2020   Recurrent major depressive disorder, in full remission ([place]) 11/30/2020   Chronic venous insufficiency 10/05/2020   Pain and swelling of lower leg 09/18/2020   Raynauds phenomenon 09/18/2020   Hyperlipidemia 09/18/2020   COVID-19 04/30/2020   History of total knee arthroplasty 09/08/2019   Vitamin D deficiency 12/03/2018   Knee arthropathy 07/28/2018   Anxiety 04/07/2018   Hyperlipidemia associated with type 2  diabetes mellitus ([place]) 04/07/2018   Mixed stress and urge urinary incontinence 02/25/2017   Right sided weakness 09/26/2016   Asthma 11/15/2015   Essential hypertension 10/20/2014   Diabetes mellitus associated with hormonal etiology ([place]) 10/20/2014   Celiac sprue 10/20/2014   BMI 40.0-44.9, adult ([place]) 10/20/2014   Fatty infiltration of liver 08/31/2014    PCP: Park Liter, DO  REFERRING PROVIDER: Valerie Roys, DO   REFERRING DIAG:  N39.46 (ICD-10-CM) - Mixed stress and urge urinary incontinence   THERAPY DIAG:  Pelvic floor dysfunction  Other lack of coordination  Muscle weakness (generalized)  Rationale for Evaluation and Treatment: Rehabilitation  ONSET DATE: 5 years ago   PRECAUTIONS: None  WEIGHT BEARING RESTRICTIONS: No  FALLS:  Has patient fallen in last 6 months? 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 Pt has lost weight but noticed the leakage was heavier with increased weight. Pt has tried to pinpoint when the leakage is worse (with certain food or exercise) but cannot figure it out. Pt was taking one BP medication that encouraged more fluid removal but Dr has since changed it and leakage has been better but is still present and limits her. Pt tries not to lift anything because she knows she will leak. Pt hasn't noticed too much leakage  with walking but still is present. Pt has a hx of intense core exercises. Pt does have COPD and cannot lie flat for very long, Pt sleeps with multiple pillows and feels her COPD is well managed. Walking 7 days a week (2-3 miles), and working with a Physiological scientist 3x per week, a lot of strength training. Has to wear a Level 3 pad with any physical activity.    PATIENT GOALS: Pt would like to stop the leakage and not wear pads, and strengthen parts of her body that  she is not aware of     UROLOGICAL HISTORY Fluid intake: Yes: water all day w/lemon/limes, diet soft drink very occasionally   Pain with urination: No Fully empty bladder: Yes Stream:  sitting/but does stop and go  Urgency: Yes  Frequency: 6-10x/day  Nocturia: 1x Leakage: Walking to the bathroom, Exercise, Lifting, Bending forward, and Intercourse Pads: Yes Type: incontinence pads, #2, when out in the community #3  Amount: 6-8x/day  Bladder control (0-10): 4/10   SEXUAL HISTORY/FUNCTION  Pain with intercourse: No Able to achieve orgasm?: Yes  OBSTETRICAL HISTORY Vaginal deliveries: G4P4 Tearing: Yes: stitches with all of them    GYNECOLOGICAL HISTORY Hysterectomy: yes, abdominal hysterectomy  Pelvic Organ Prolapse: None Pain with exam: no  Heaviness/pressure: no   SUBJECTIVE: Pt had a few days of increased urinary leakage due to increased emotional stressors. Pt finds that there is correlation between stress and urinary incontinence. Pt was able to use breath to help control but still had to change pads and undergarments.    PAIN:  Are you having pain? No   OBJECTIVE:    COGNITION: (11/01/21) Overall cognitive status: Within functional limits for tasks assessed     POSTURE:  In seated B plantarflexion with B rounded shoulders    Thoracic kyphosis: slightly in standing  Iliac crest height: L iliac crest  Pelvic obliquity: L posteriorly rotated    GAIT: Trendelenburg: positive on L    RANGE OF MOTION:    (Norm range in degrees)  LEFT 11/01/21 RIGHT 11/01/21  Lumbar forward flexion (65):  WNL    Lumbar extension (30): WNL    Lumbar lateral flexion (25):  Restricted (above knee) Restricted (above knee)  Thoracic and Lumbar rotation (30 degrees):    WNL WNL  Hip Flexion (0-125):   WNL WNL  Hip IR (0-45):  Restricted Restricted  Hip ER (0-45):  WNL WNL  Hip Adduction:      Hip Abduction (0-40):  WNL WNL  Hip extension (0-15):     (*= pain, Blank rows  = not tested)   STRENGTH: MMT    RLE 11/08/21 LLE 11/08/21  Hip Flexion 5 5  Hip Extension 4 4  Hip Abduction  4 4  Hip Adduction     Hip ER  5 5  Hip IR  5 5  Knee Extension 5 5  Knee Flexion 4 4  Dorsiflexion     Plantarflexion (seated) 5 5  (*= pain, Blank rows = not tested)   SPECIAL TESTS:  FABER (SN 81): negative FADIR (SN 94): negative   PALPATION:  Abdominal:  Diastasis:  none Scar mobility: none   EXTERNAL PELVIC EXAM: Patient educated on the purpose of the pelvic exam and articulated understanding; patient consented to the exam verbally.  Breath coordination: present but inconsistent  Voluntary Contraction: present, 2/5 MMT after significant cueing, 1st attempt activated gluteals and abdominals Relaxation: delayed Perineal movement with sustained IAP increase ("bear down"): elevation with  gluteal activation and Valsalva Perineal movement with rapid IAP increase ("cough"): no change (0= no contraction, 1= flicker, 2= weak squeeze, 3= fair squeeze with lift, 4= good squeeze and lift against resistance, 5= strong squeeze against strong resistance)    TODAY'S TREATMENT  Neuromuscular Re-education: Circuit training for improved IAP management with emphasis on form and breathing: Bicep curls with 7#  Hammer curls with 7#  Overhead march initially with 5# then decreased to 3# due to urinary leakage, but ended with no weight  "Around the worlds" both CW and CCW with 7# dumbbell  Squat with overhead press and 7# - minimal urinary leakage more towards end of activity  Requiring cueing to decrease Valsalva maneuver, adducted hips and activation of deep core  Repeated above 1x more with 1 min increments of activity and 30 secs rest   Lengthy discussion on how progression will continue going forward with IAP management and controlling urinary incontinence as Pt is concerned as she still has leakage with increased stress, occasionally at work, and during increased  physical activity. DPT addressed all 3 concerns above and based off today's circuit training, Pt consistently holds her breath during increased physical activity. Emphasis on decreasing weight with personal trainer, form, and practicing breathing mechanics with exertion (breath on exhale).   Patient response to interventions: Pt felt comfortable with progressing to every 2 weeks    Patient Education:  Patient provided HEP: modifications on activities with personal trainer. Patient educated throughout session on appropriate technique and form using multi-modal cueing, HEP, and activity modification. Patient will benefit from further education in order to maximize compliance and understanding for long-term therapeutic gains.     ASSESSMENT:  Clinical Impression: Patient presents to clinic with excellent motivation to participate in today's session. Pt continues to demonstrate deficits in IAP management, PFM strength, PFM coordination, LE strength, ROM and posture. Pt reports a few days of increased urinary incontinence and after continued motivational interviewing, Pt reports that stress increases her urinary incontinence. DPT agreed with statement based on conversations had in the past in regards to cortisol levels. Pt required moderate VCs and TCs during circuit training activities for coordinated breathing (observed to Valsalva) and proper technique/form. Weight and form had to be adjusted due to increased urinary leakage with increased weight. Pt improved with modifications. After some discussion, Pt comfortable to progress to every other week to continue advocating for IND and promoting continued work with HEP and modifying with Physiological scientist. Pt responded well to active and educational interventions. Pt will continue to benefit from skilled therapeutic intervention to address deficits in IAP management, PFM strength, PFM coordination, PFM endurance, ROM and posture in order to increase  confidence in bladder, minimize incontinence pad use, and increase PLOF and improve overall QOL.    Objective Impairments: decreased activity tolerance, decreased coordination, decreased endurance, decreased ROM, decreased strength, improper body mechanics, and postural dysfunction.   Activity Limitations: carrying, lifting, bending, squatting, continence, toileting, and locomotion level  Personal Factors: Age, Behavior pattern, Fitness, Past/current experiences, Time since onset of injury/illness/exacerbation, and 3+ comorbidities: COPD, HTN, diabetes  are also affecting patient's functional outcome.   Rehab Potential: Good  Clinical Decision Making: Evolving/moderate complexity  Evaluation Complexity: Moderate   GOALS: Goals reviewed with patient? Yes  SHORT TERM GOALS: Target date: 02/14/2022  Patient will demonstrate independence with HEP in order to maximize therapeutic gains and improve carryover from physical therapy sessions to ADLs in the home and community. Baseline: toileting posture handout, (8/17): continues to  perform HEP requiring cueing in session; (9/14): Pt is IND with HEP Goal status: MET    LONG TERM GOALS: Target date:  03/06/22  Patient will score >/= 60 on FOTO Urinary Problem  in order to demonstrate improved IAP management, improved PFM coordination, and overall improved QOL.  Baseline: 52, (8/17): 56, (9/14): 62 Goal status: MET  2.  Patient will report decreased reliance on protective undergarments as indicated by a 24 hour period to demonstrate improved bladder control and allow for increased participation in activities outside of the home. Baseline: Lvl 2 (normally) and Lvl 3 (exercise) incontinence pads about 6-8x/day, (8/17): Lvl 3 (outside) changing 2-3x/day, and does not wear pad but still has to change 1x but not everyday; (9/14): Lvl 2 during the day and Lvl 3 out in the community 3x/day for hygiene but only 1 pad on average is wet  Goal status:  IN PROGRESS  3.  Patient will report less than 5 incidents of stress urinary incontinence over the course of 3 weeks while lifting/bending/squatting/prolonged activity (exercise) in order to demonstrate improved PFM coordination, strength, and function for improved overall QOL. Baseline: leakage with all the above every time; (8/17): leakage still with deep squat but not lifting/bending; (9/14): Pt removed deep squats from routine but even with wall squats Pt is not leaking, no leakage with lifting/bending  Goal status: MET  4.  Patient will report being able to return to activities including, but not limited to: physical activity, long walk, dancing, sexual intercourse without leakage or limitation to indicate complete resolution of the chief concern and return to prior level of participation at home and in the community. Baseline: unable to participate in activities due to leakage and apprehension; (8/17): dancing no leakage, apprehensive with intercourse; (9/14): dancing no leakage, with intercourse only a dribble if that Goal status: IN PROGRESS  5.  Patient will report confidence in ability to control bladder > 7/10 in order to demonstrate improved function and ability to participate more fully in activities at home and in the community. Baseline: 4/10; (8/17): 7/10; (9/14): 8/10 Goal status: MET   PLAN: PT Frequency: 1x/week  PT Duration: 12 weeks  Planned Interventions: Therapeutic exercises, Therapeutic activity, Neuromuscular re-education, Balance training, Gait training, Patient/Family education, Self Care, Joint mobilization, Spinal mobilization, Cryotherapy, Moist heat, scar mobilization, Taping, and Manual therapy  Plan For Next Session:   how was the 2 weeks, how was personal training?, wall planks/pallof press    Duru Reiger, PT, DPT  01/31/2022, 8:53 AM

## 2022-02-07 ENCOUNTER — Ambulatory Visit: Payer: 59

## 2022-02-14 ENCOUNTER — Ambulatory Visit: Payer: 59

## 2022-02-14 DIAGNOSIS — M6289 Other specified disorders of muscle: Secondary | ICD-10-CM

## 2022-02-14 DIAGNOSIS — M6281 Muscle weakness (generalized): Secondary | ICD-10-CM

## 2022-02-14 DIAGNOSIS — R278 Other lack of coordination: Secondary | ICD-10-CM

## 2022-02-14 NOTE — Therapy (Signed)
OUTPATIENT PHYSICAL THERAPY FEMALE PELVIC TREATMENT   Patient Name: [name] MRN: [id number] DOB:[birth year], 66 y.o., female Today's Date: 02/14/2022   PT End of Session - 02/14/22 0802     Visit Number 14    Number of Visits 20    Date for PT Re-Evaluation 03/06/22    Authorization Type IE: 11/01/21, PN/RC: 01/03/22    PT Start Time 0800    PT Stop Time 0840    PT Time Calculation (min) 40 min    Activity Tolerance Patient tolerated treatment well             Past Medical History:  Diagnosis Date   Asthma    Depression    Diabetes mellitus without complication ([place])    Hyperlipidemia    Kidney stones    Migraine headache    Past Surgical History:  Procedure Laterality Date   ABDOMINAL HYSTERECTOMY     BREAST CYST ASPIRATION Right 1981   benign   BREAST CYST ASPIRATION Right 1984   benign   CHOLECYSTECTOMY     COLONOSCOPY  March 2016   FLEXIBLE BRONCHOSCOPY N/A 01/26/2018   Procedure: FLEXIBLE BRONCHOSCOPY;  Surgeon: Laverle Hobby, MD;  Location: [facility] ORS;  Service: Pulmonary;  Laterality: N/A;   MENISCUS REPAIR Left 09/18/2018   OOPHORECTOMY     PARTIAL KNEE ARTHROPLASTY Left 02/2019   Patient Active Problem List   Diagnosis Date Noted   Statin myopathy 10/16/2021   Obesity (BMI 35.0-39.9 without comorbidity) 06/13/2021   COPD (chronic obstructive pulmonary disease) ([place]) 01/19/2021   Morbid obesity ([place]) 01/19/2021   Gastroesophageal reflux disease without esophagitis 01/19/2021   Absence of bladder continence 11/30/2020   Recurrent major depressive disorder, in full remission ([place]) 11/30/2020   Chronic venous insufficiency 10/05/2020   Pain and swelling of lower leg 09/18/2020   Raynauds phenomenon 09/18/2020   Hyperlipidemia 09/18/2020   COVID-19 04/30/2020   History of total knee arthroplasty 09/08/2019   Vitamin D deficiency 12/03/2018   Knee arthropathy 07/28/2018   Anxiety 04/07/2018   Hyperlipidemia associated with type 2  diabetes mellitus ([place]) 04/07/2018   Mixed stress and urge urinary incontinence 02/25/2017   Right sided weakness 09/26/2016   Asthma 11/15/2015   Essential hypertension 10/20/2014   Diabetes mellitus associated with hormonal etiology ([place]) 10/20/2014   Celiac sprue 10/20/2014   BMI 40.0-44.9, adult ([place]) 10/20/2014   Fatty infiltration of liver 08/31/2014    PCP: Park Liter, DO  REFERRING PROVIDER: Valerie Roys, DO   REFERRING DIAG:  N39.46 (ICD-10-CM) - Mixed stress and urge urinary incontinence   THERAPY DIAG:  Pelvic floor dysfunction  Other lack of coordination  Muscle weakness (generalized)  Rationale for Evaluation and Treatment: Rehabilitation  ONSET DATE: 5 years ago   PRECAUTIONS: None  WEIGHT BEARING RESTRICTIONS: No  FALLS:  Has patient fallen in last 6 months? 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 Pt has lost weight but noticed the leakage was heavier with increased weight. Pt has tried to pinpoint when the leakage is worse (with certain food or exercise) but cannot figure it out. Pt was taking one BP medication that encouraged more fluid removal but Dr has since changed it and leakage has been better but is still present and limits her. Pt tries not to lift anything because she knows she will leak. Pt hasn't noticed too much leakage  with walking but still is present. Pt has a hx of intense core exercises. Pt does have COPD and cannot lie flat for very long, Pt sleeps with multiple pillows and feels her COPD is well managed. Walking 7 days a week (2-3 miles), and working with a Physiological scientist 3x per week, a lot of strength training. Has to wear a Level 3 pad with any physical activity.    PATIENT GOALS: Pt would like to stop the leakage and not wear pads, and strengthen parts of her body that  she is not aware of     UROLOGICAL HISTORY Fluid intake: Yes: water all day w/lemon/limes, diet soft drink very occasionally   Pain with urination: No Fully empty bladder: Yes Stream:  sitting/but does stop and go  Urgency: Yes  Frequency: 6-10x/day  Nocturia: 1x Leakage: Walking to the bathroom, Exercise, Lifting, Bending forward, and Intercourse Pads: Yes Type: incontinence pads, #2, when out in the community #3  Amount: 6-8x/day  Bladder control (0-10): 4/10   SEXUAL HISTORY/FUNCTION  Pain with intercourse: No Able to achieve orgasm?: Yes  OBSTETRICAL HISTORY Vaginal deliveries: G4P4 Tearing: Yes: stitches with all of them    GYNECOLOGICAL HISTORY Hysterectomy: yes, abdominal hysterectomy  Pelvic Organ Prolapse: None Pain with exam: no  Heaviness/pressure: no   SUBJECTIVE: Over the past 2 weeks, Pt reports increased accidents of urinary leakage where it is running down the legs even with the Lvl4/5 pads. Pt had an accident at the gym as well as in the home where she had to go change clothes. Pt does not feel any increased external stress, has been practicing HEP every day especially the PFM lengthening techniques, and still having these accidents.    PAIN:  Are you having pain? No   OBJECTIVE:    COGNITION: (11/01/21) Overall cognitive status: Within functional limits for tasks assessed     POSTURE:  In seated B plantarflexion with B rounded shoulders    Thoracic kyphosis: slightly in standing  Iliac crest height: L iliac crest  Pelvic obliquity: L posteriorly rotated    GAIT: Trendelenburg: positive on L    RANGE OF MOTION:    (Norm range in degrees)  LEFT 11/01/21 RIGHT 11/01/21  Lumbar forward flexion (65):  WNL    Lumbar extension (30): WNL    Lumbar lateral flexion (25):  Restricted (above knee) Restricted (above knee)  Thoracic and Lumbar rotation (30 degrees):    WNL WNL  Hip Flexion (0-125):   WNL WNL  Hip IR (0-45):  Restricted Restricted   Hip ER (0-45):  WNL WNL  Hip Adduction:      Hip Abduction (0-40):  WNL WNL  Hip extension (0-15):     (*= pain, Blank rows = not tested)   STRENGTH: MMT    RLE 11/08/21 LLE 11/08/21  Hip Flexion 5 5  Hip Extension 4 4  Hip Abduction  4 4  Hip Adduction     Hip ER  5 5  Hip IR  5 5  Knee Extension 5 5  Knee Flexion 4 4  Dorsiflexion     Plantarflexion (seated) 5 5  (*= pain, Blank rows = not tested)   SPECIAL TESTS:  FABER (SN 81): negative FADIR (SN 94): negative   PALPATION:  Abdominal:  Diastasis:  none Scar mobility: none   EXTERNAL PELVIC EXAM: Patient educated on the purpose of the pelvic exam and articulated understanding; patient consented to the exam verbally.  Breath coordination: present but inconsistent  Voluntary Contraction: present, 2/5 MMT after significant cueing, 1st attempt activated gluteals and abdominals Relaxation: delayed Perineal movement with sustained IAP increase ("bear down"): elevation with gluteal activation and Valsalva Perineal movement with rapid IAP increase ("cough"): no change (0= no contraction, 1= flicker, 2= weak squeeze, 3= fair squeeze with lift, 4= good squeeze and lift against resistance, 5= strong squeeze against strong resistance)    TODAY'S TREATMENT  Neuromuscular Re-education: Pre-treatment assessment: In standing assessing for any bulge in an anti-gravity position (standing) to gather information for DO as Pt would like to have a medical confirmation of a prolapse  No pain at vaginal opening or upon insertion  Noticeable quivering of PFM felt with minimal breath coordination  2/5 MMT tested in standing  When asked to cough or perform mini squat - no tissue felt expand or bulge or seen at vaginal opening   At this time, Pt not presenting with common sxs of a prolapse nor did standing assessment with increased IAP pressure reveal a bulge. DPT advised Pt if she would like medical confirmation, then she would have to  make an appt with a DO or GYN/urologist.   Discussion on what an internal PFM exam may reveal and can gather more information in relation to increase in PFM muscle activation in standing (spasms). Use of visual model   Pt asked about Botox or any further meds. DPT advised Pt to ask DO as this is outside the scope of practice for physical therapy.  Discussion on the estrogen hormone that allows for lubrication and proper blood flow to the vulvar tissue internally and externally. Pt hesitant to use estrogen creams. Discussed natural moisturizers and given handout.   Continuing breath coordination and PFM lengthening/relaxation techniques to aid in PFM relaxation and coordination. Pt verbalized understanding.   Patient response to interventions: Pt felt comfortable to return next week if possible for an internal PFM assessment    Patient Education:  Patient provided HEP: no change. Patient educated throughout session on appropriate technique and form using multi-modal cueing, HEP, and activity modification. Patient will benefit from further education in order to maximize compliance and understanding for long-term therapeutic gains.     ASSESSMENT:  Clinical Impression: Patient presents to clinic with excellent motivation to participate in today's session. Pt continues to demonstrate deficits in IAP management, PFM strength, PFM coordination, LE strength, ROM and posture. Pt reports increased urinary leakage accidents over the past few weeks. Most of these accidents involved Pt having to change undergarments/outerwear due to urine coming down the leg even with a Lvl 4/5 incontinence pad. Pt noticed these accidents happened with STSs and general walking. After discussion with DPT, Pt reports no increase in external stressors/tension and continues her diaphragmatic breathing with transitional movements, postural techniques, PFM lengthening techniques, and managing IAP with deep core work. Pt reports  no changes with medication. Pt expressed concern of a pelvic organ prolapse and DPT stated that it is outside of our scope of practice to diagnose a prolapse. After assessing the pelvic floor in an anti-gravity position (standing), DPT felt no increased bulge with coughing or performing a mini squat. However, DPT did observe upon palpation increased spasms of the pelvic floor with minimal breath coordination. Pt able to demonstrate 2/5 MMT of PFM in standing. Based on these results, DPT advised Pt if she would like a medical confirmation of a prolapse to seek an appt with her DO; however, Pt not having sxs of heaviness/pressure at the vaginal opening or other  common sxs of a POP. Lengthy discussion on performing an internal PFM assessment to examine the pelvic floor as this may provide further information. Pt verbalized understanding. Pt responded well to active and educational interventions. Pt will continue to benefit from skilled therapeutic intervention to address deficits in IAP management, PFM strength, PFM coordination, PFM endurance, ROM and posture in order to increase confidence in bladder, minimize incontinence pad use, and increase PLOF and improve overall QOL.    Objective Impairments: decreased activity tolerance, decreased coordination, decreased endurance, decreased ROM, decreased strength, improper body mechanics, and postural dysfunction.   Activity Limitations: carrying, lifting, bending, squatting, continence, toileting, and locomotion level  Personal Factors: Age, Behavior pattern, Fitness, Past/current experiences, Time since onset of injury/illness/exacerbation, and 3+ comorbidities: COPD, HTN, diabetes  are also affecting patient's functional outcome.   Rehab Potential: Good  Clinical Decision Making: Evolving/moderate complexity  Evaluation Complexity: Moderate   GOALS: Goals reviewed with patient? Yes  SHORT TERM GOALS: Target date: 02/14/2022  Patient will demonstrate  independence with HEP in order to maximize therapeutic gains and improve carryover from physical therapy sessions to ADLs in the home and community. Baseline: toileting posture handout, (8/17): continues to perform HEP requiring cueing in session; (9/14): Pt is IND with HEP Goal status: MET    LONG TERM GOALS: Target date:  03/06/22  Patient will score >/= 60 on FOTO Urinary Problem  in order to demonstrate improved IAP management, improved PFM coordination, and overall improved QOL.  Baseline: 52, (8/17): 56, (9/14): 62 Goal status: MET  2.  Patient will report decreased reliance on protective undergarments as indicated by a 24 hour period to demonstrate improved bladder control and allow for increased participation in activities outside of the home. Baseline: Lvl 2 (normally) and Lvl 3 (exercise) incontinence pads about 6-8x/day, (8/17): Lvl 3 (outside) changing 2-3x/day, and does not wear pad but still has to change 1x but not everyday; (9/14): Lvl 2 during the day and Lvl 3 out in the community 3x/day for hygiene but only 1 pad on average is wet  Goal status: IN PROGRESS  3.  Patient will report less than 5 incidents of stress urinary incontinence over the course of 3 weeks while lifting/bending/squatting/prolonged activity (exercise) in order to demonstrate improved PFM coordination, strength, and function for improved overall QOL. Baseline: leakage with all the above every time; (8/17): leakage still with deep squat but not lifting/bending; (9/14): Pt removed deep squats from routine but even with wall squats Pt is not leaking, no leakage with lifting/bending  Goal status: MET  4.  Patient will report being able to return to activities including, but not limited to: physical activity, long walk, dancing, sexual intercourse without leakage or limitation to indicate complete resolution of the chief concern and return to prior level of participation at home and in the community. Baseline:  unable to participate in activities due to leakage and apprehension; (8/17): dancing no leakage, apprehensive with intercourse; (9/14): dancing no leakage, with intercourse only a dribble if that Goal status: IN PROGRESS  5.  Patient will report confidence in ability to control bladder > 7/10 in order to demonstrate improved function and ability to participate more fully in activities at home and in the community. Baseline: 4/10; (8/17): 7/10; (9/14): 8/10 Goal status: MET   PLAN: PT Frequency: 1x/week  PT Duration: 12 weeks  Planned Interventions: Therapeutic exercises, Therapeutic activity, Neuromuscular re-education, Balance training, Gait training, Patient/Family education, Self Care, Joint mobilization, Spinal mobilization, Cryotherapy, Moist heat, scar  mobilization, Taping, and Manual therapy  Plan For Next Session: check hips, PFM internal exam, wall planks/pallof press    Demya Scruggs, PT, DPT  02/14/2022, 10:46 AM

## 2022-02-15 IMAGING — MG DIGITAL SCREENING BILAT W/ TOMO W/ CAD
8 series · 8 of 24 positions shown · non-contrast
Comparison: Previous exam(s).

CLINICAL DATA: Screening.

EXAM:
DIGITAL SCREENING BILATERAL MAMMOGRAM WITH TOMO AND CAD

[L CC synth-2D]
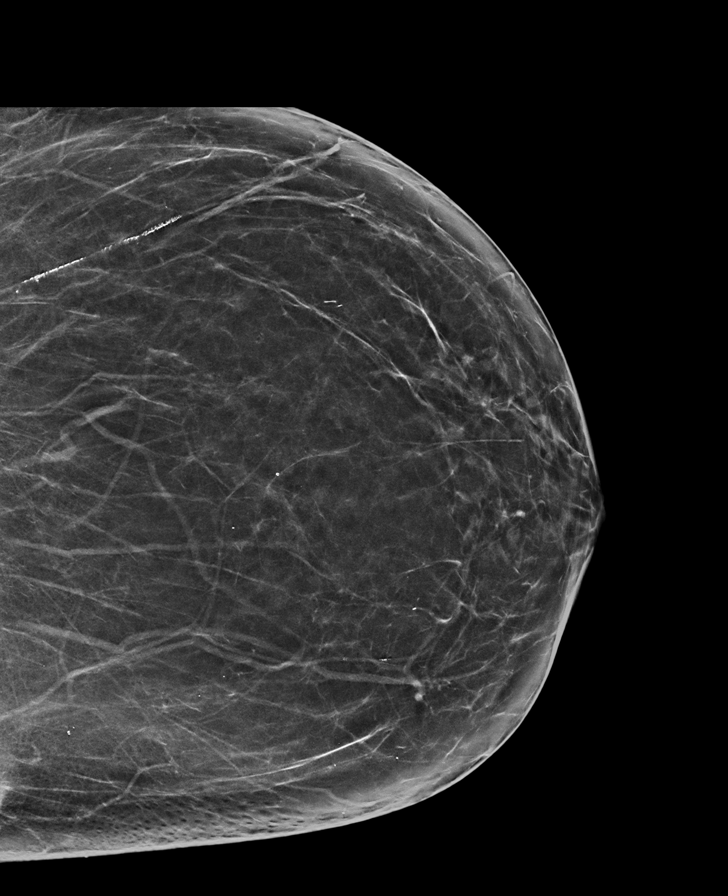

[R MLO synth-2D]
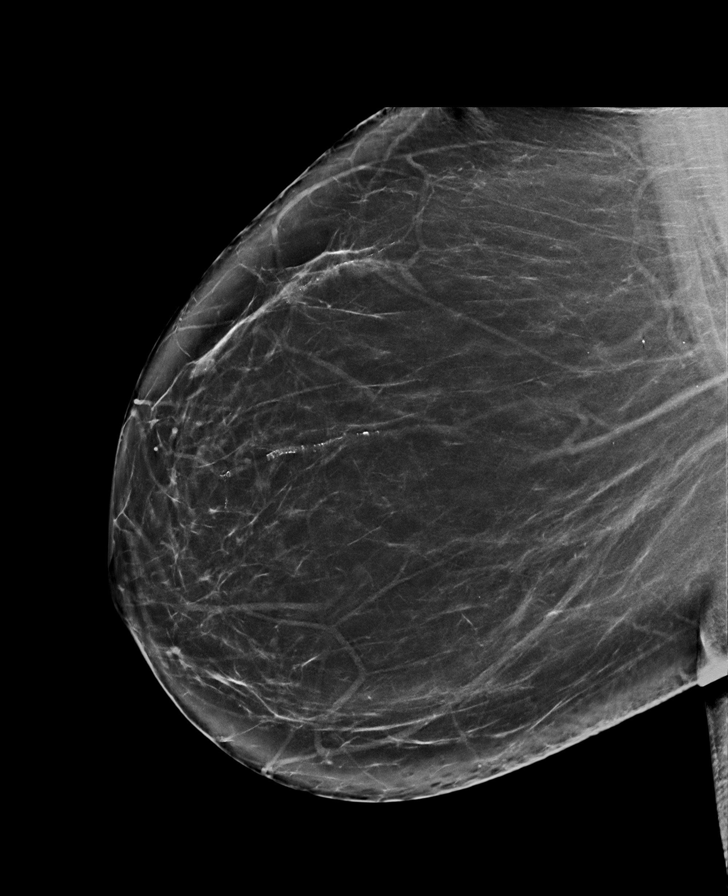

[L MLO synth-2D]
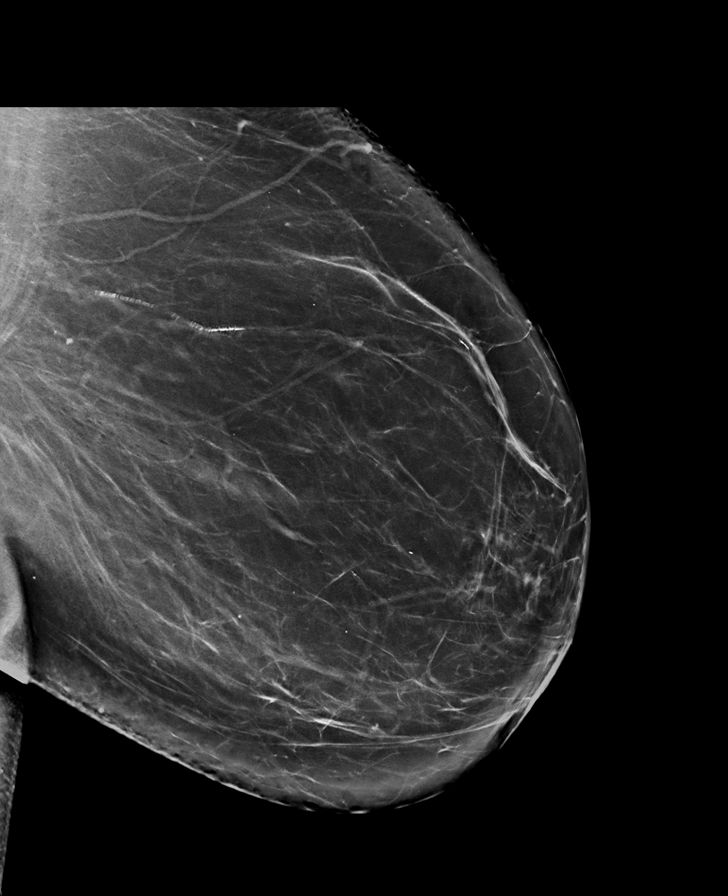

[R CC synth-2D]
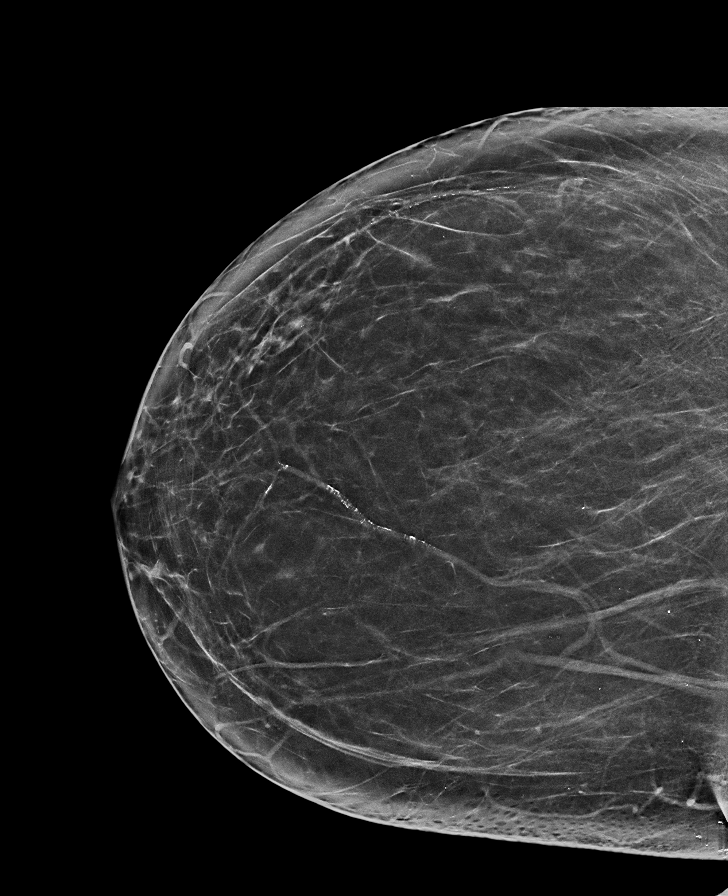

[L MLO tomo · tomo slice 48/95.0]
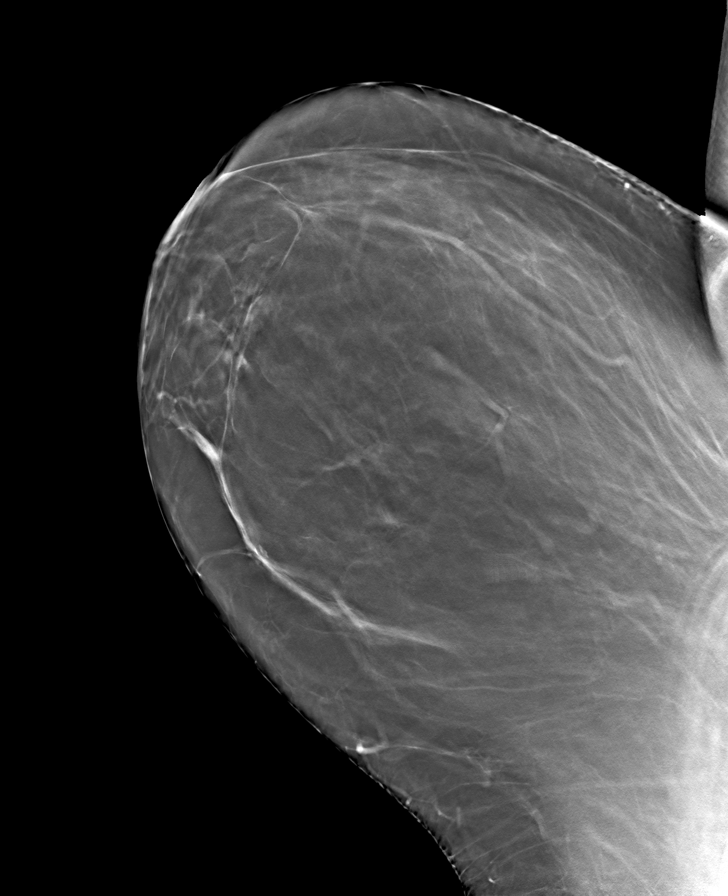

[L CC tomo · tomo slice 39/77.0]
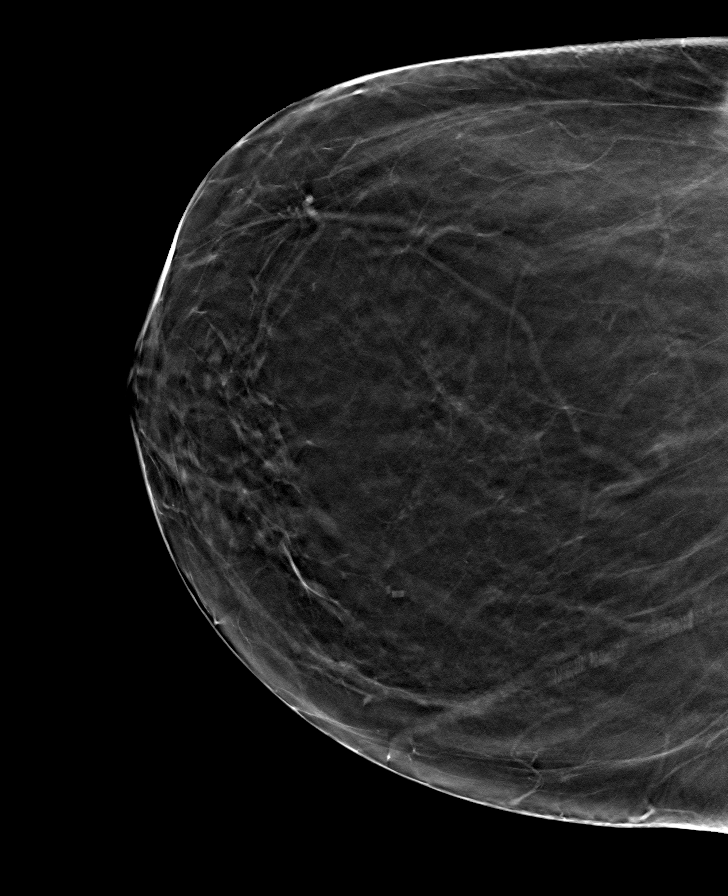

[R CC tomo · tomo slice 39/78.0]
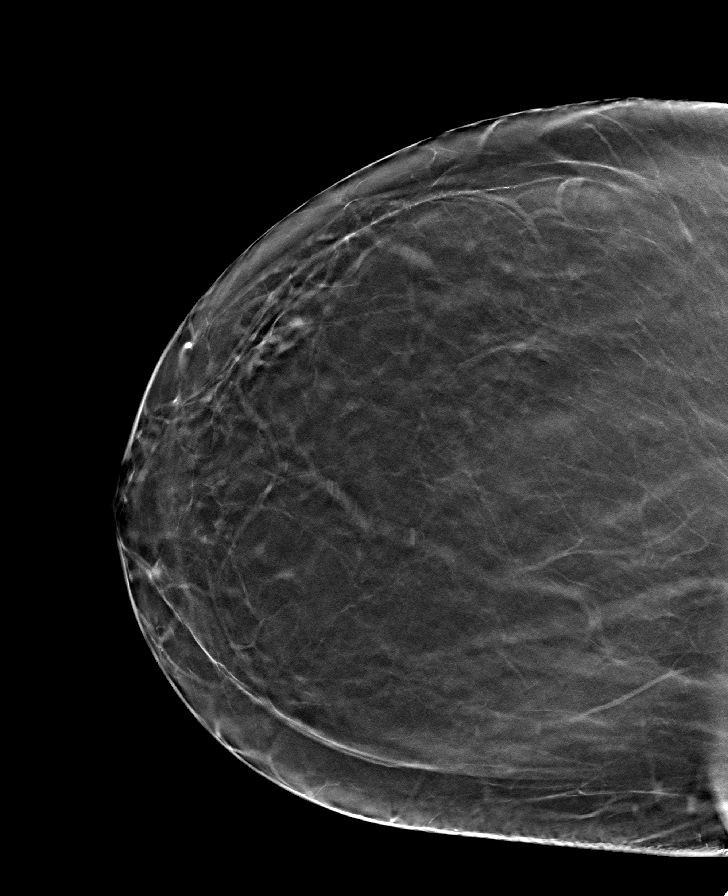

[R MLO tomo · tomo slice 46/91.0]
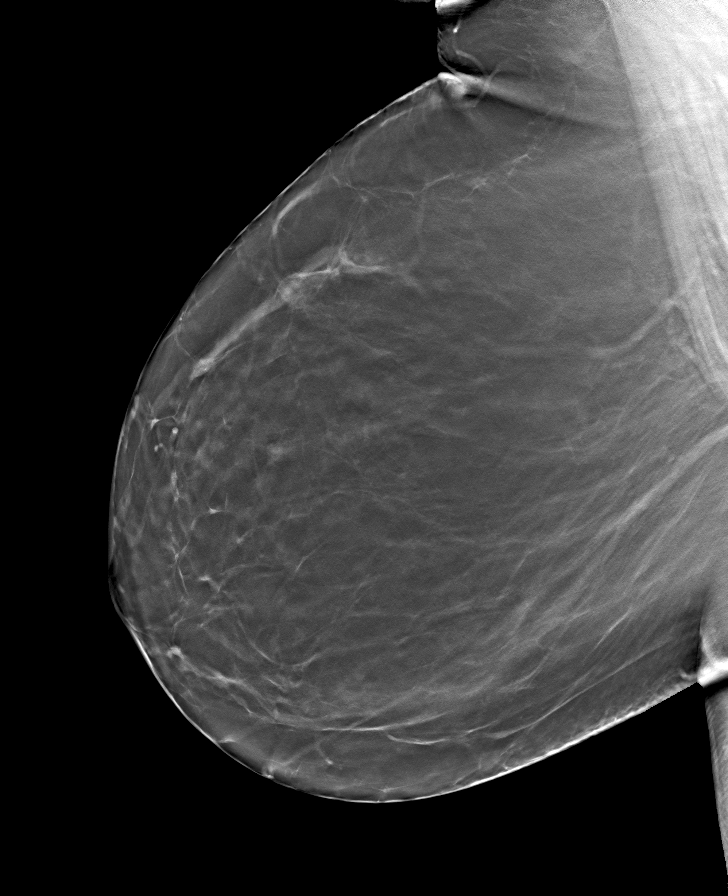

[8 of 24 positions shown; findings below may reference images not displayed]

ACR Breast Density Category b: There are scattered areas of
fibroglandular density.
FINDINGS: There are no findings suspicious for malignancy. Images were
processed with CAD.
IMPRESSION: No mammographic evidence of malignancy. A result letter of this
screening mammogram will be mailed directly to the patient.

RECOMMENDATION:
Screening mammogram in one year. (Code:[id number])

BI-RADS CATEGORY  1: Negative.

## 2022-02-18 ENCOUNTER — Encounter (INDEPENDENT_AMBULATORY_CARE_PROVIDER_SITE_OTHER): Payer: Self-pay

## 2022-02-20 ENCOUNTER — Ambulatory Visit: Payer: 59 | Attending: Family Medicine

## 2022-02-20 DIAGNOSIS — M6289 Other specified disorders of muscle: Secondary | ICD-10-CM | POA: Diagnosis not present

## 2022-02-20 DIAGNOSIS — M6281 Muscle weakness (generalized): Secondary | ICD-10-CM | POA: Insufficient documentation

## 2022-02-20 DIAGNOSIS — R278 Other lack of coordination: Secondary | ICD-10-CM | POA: Diagnosis present

## 2022-02-20 NOTE — Therapy (Signed)
OUTPATIENT PHYSICAL THERAPY FEMALE PELVIC TREATMENT   Patient Name: [name] MRN: [id number] DOB:11/05/1955, 66 y.o., female Today's Date: 02/20/2022   PT End of Session - 02/20/22 0843     Visit Number 15    Number of Visits 20    Date for PT Re-Evaluation 03/06/22    Authorization Type IE: 11/01/21, PN/RC: 01/03/22    PT Start Time 0845    PT Stop Time 0930    PT Time Calculation (min) 45 min    Activity Tolerance Patient tolerated treatment well             Past Medical History:  Diagnosis Date   Asthma    Depression    Diabetes mellitus without complication ([place])    Hyperlipidemia    Kidney stones    Migraine headache    Past Surgical History:  Procedure Laterality Date   ABDOMINAL HYSTERECTOMY     BREAST CYST ASPIRATION Right 1981   benign   BREAST CYST ASPIRATION Right 1984   benign   CHOLECYSTECTOMY     COLONOSCOPY  March 2016   FLEXIBLE BRONCHOSCOPY N/A 01/26/2018   Procedure: FLEXIBLE BRONCHOSCOPY;  Surgeon: Laverle Hobby, MD;  Location: [facility] ORS;  Service: Pulmonary;  Laterality: N/A;   MENISCUS REPAIR Left 09/18/2018   OOPHORECTOMY     PARTIAL KNEE ARTHROPLASTY Left 02/2019   Patient Active Problem List   Diagnosis Date Noted   Statin myopathy 10/16/2021   Obesity (BMI 35.0-39.9 without comorbidity) 06/13/2021   COPD (chronic obstructive pulmonary disease) ([place]) 01/19/2021   Morbid obesity ([place]) 01/19/2021   Gastroesophageal reflux disease without esophagitis 01/19/2021   Absence of bladder continence 11/30/2020   Recurrent major depressive disorder, in full remission ([place]) 11/30/2020   Chronic venous insufficiency 10/05/2020   Pain and swelling of lower leg 09/18/2020   Raynauds phenomenon 09/18/2020   Hyperlipidemia 09/18/2020   COVID-19 04/30/2020   History of total knee arthroplasty 09/08/2019   Vitamin D deficiency 12/03/2018   Knee arthropathy 07/28/2018   Anxiety 04/07/2018   Hyperlipidemia associated with type 2  diabetes mellitus ([place]) 04/07/2018   Mixed stress and urge urinary incontinence 02/25/2017   Right sided weakness 09/26/2016   Asthma 11/15/2015   Essential hypertension 10/20/2014   Diabetes mellitus associated with hormonal etiology ([place]) 10/20/2014   Celiac sprue 10/20/2014   BMI 40.0-44.9, adult ([place]) 10/20/2014   Fatty infiltration of liver 08/31/2014    PCP: Park Liter, DO  REFERRING PROVIDER: Valerie Roys, DO   REFERRING DIAG:  N39.46 (ICD-10-CM) - Mixed stress and urge urinary incontinence   THERAPY DIAG:  Pelvic floor dysfunction  Other lack of coordination  Muscle weakness (generalized)  Rationale for Evaluation and Treatment: Rehabilitation  ONSET DATE: 5 years ago   PRECAUTIONS: None  WEIGHT BEARING RESTRICTIONS: No  FALLS:  Has patient fallen in last 6 months? 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 Pt has lost weight but noticed the leakage was heavier with increased weight. Pt has tried to pinpoint when the leakage is worse (with certain food or exercise) but cannot figure it out. Pt was taking one BP medication that encouraged more fluid removal but Dr has since changed it and leakage has been better but is still present and limits her. Pt tries not to lift anything because she knows she will leak. Pt hasn't noticed too much leakage  with walking but still is present. Pt has a hx of intense core exercises. Pt does have COPD and cannot lie flat for very long, Pt sleeps with multiple pillows and feels her COPD is well managed. Walking 7 days a week (2-3 miles), and working with a Physiological scientist 3x per week, a lot of strength training. Has to wear a Level 3 pad with any physical activity.    PATIENT GOALS: Pt would like to stop the leakage and not wear pads, and strengthen parts of her body that  she is not aware of     UROLOGICAL HISTORY Fluid intake: Yes: water all day w/lemon/limes, diet soft drink very occasionally   Pain with urination: No Fully empty bladder: Yes Stream:  sitting/but does stop and go  Urgency: Yes  Frequency: 6-10x/day  Nocturia: 1x Leakage: Walking to the bathroom, Exercise, Lifting, Bending forward, and Intercourse Pads: Yes Type: incontinence pads, #2, when out in the community #3  Amount: 6-8x/day  Bladder control (0-10): 4/10   SEXUAL HISTORY/FUNCTION  Pain with intercourse: No Able to achieve orgasm?: Yes  OBSTETRICAL HISTORY Vaginal deliveries: G4P4 Tearing: Yes: stitches with all of them    GYNECOLOGICAL HISTORY Hysterectomy: yes, abdominal hysterectomy  Pelvic Organ Prolapse: None Pain with exam: no  Heaviness/pressure: no   SUBJECTIVE: Over the past 2 weeks, Pt reports increased accidents of urinary leakage where it is running down the legs even with the Lvl4/5 pads. Pt had an accident at the gym as well as in the home where she had to go change clothes. Pt does not feel any increased external stress, has been practicing HEP every day especially the PFM lengthening techniques, and still having these accidents.    PAIN:  Are you having pain? No   OBJECTIVE:    COGNITION: (11/01/21) Overall cognitive status: Within functional limits for tasks assessed     POSTURE:  In seated B plantarflexion with B rounded shoulders    Thoracic kyphosis: slightly in standing  Iliac crest height: L iliac crest  Pelvic obliquity: L posteriorly rotated    GAIT: Trendelenburg: positive on L    RANGE OF MOTION:    (Norm range in degrees)  LEFT 11/01/21 RIGHT 11/01/21  Lumbar forward flexion (65):  WNL    Lumbar extension (30): WNL    Lumbar lateral flexion (25):  Restricted (above knee) Restricted (above knee)  Thoracic and Lumbar rotation (30 degrees):    WNL WNL  Hip Flexion (0-125):   WNL WNL  Hip IR (0-45):  Restricted Restricted   Hip ER (0-45):  WNL WNL  Hip Adduction:      Hip Abduction (0-40):  WNL WNL  Hip extension (0-15):     (*= pain, Blank rows = not tested)   STRENGTH: MMT    RLE 11/08/21 LLE 11/08/21  Hip Flexion 5 5  Hip Extension 4 4  Hip Abduction  4 4  Hip Adduction     Hip ER  5 5  Hip IR  5 5  Knee Extension 5 5  Knee Flexion 4 4  Dorsiflexion     Plantarflexion (seated) 5 5  (*= pain, Blank rows = not tested)   SPECIAL TESTS:  FABER (SN 81): negative FADIR (SN 94): negative   PALPATION:  Abdominal:  Diastasis:  none Scar mobility: none   EXTERNAL PELVIC EXAM: Patient educated on the purpose of the pelvic exam and articulated understanding; patient consented to the exam verbally.  Breath coordination: present but inconsistent  Voluntary Contraction: present, 2/5 MMT after significant cueing, 1st attempt activated gluteals and abdominals Relaxation: delayed Perineal movement with sustained IAP increase ("bear down"): elevation with gluteal activation and Valsalva Perineal movement with rapid IAP increase ("cough"): no change (0= no contraction, 1= flicker, 2= weak squeeze, 3= fair squeeze with lift, 4= good squeeze and lift against resistance, 5= strong squeeze against strong resistance)    TODAY'S TREATMENT  Neuromuscular Re-education: Lengthy discussion on HEP and how Pt is performing pelvic floor contractions and various TrA activation techniques. Pt performing multiple bouts of PFM contractions. Discussion on quality vs quantity approach. DPT discussed Pt only to perform 3 quick flicks but will remove as part of HEP for now. In addition, discussion on how increasing PFM tension or excessive activation of the deep core can impact PFM coordination and tension leading to increased urinary leakage and accidents.   Discussion on how the PFM differ from larger msucle groups (biceps, quads, gluteals) and how a "if I do more" approach can cause more problems at the pelvic floor. Pt  verbalized understanding.   Body scanning audio video via [employer] for downregulation of nervous system. Feet elevated on textbook to avoid increased plantarflexion.   Patient response to interventions: Pt felt comfortable to return next week for an internal PFM assessment    Patient Education:  Patient provided HEP: no change, no more PFM contractions. Patient educated throughout session on appropriate technique and form using multi-modal cueing, HEP, and activity modification. Patient will benefit from further education in order to maximize compliance and understanding for long-term therapeutic gains.     ASSESSMENT:  Clinical Impression: Patient presents to clinic with excellent motivation to participate in today's session. Pt continues to demonstrate deficits in IAP management, PFM strength, PFM coordination, LE strength, ROM and posture. Pt with no increased urinary leakage accidents this week; however, is still having to wear heavier pad due to leakage. Lengthy discussion on Pt's HEP and how many reps and sets are being performed with various activities we have reviewed. After discussion, Pt may be placing increased tension at the PFM with continuous pelvic floor contractions and TrA activation even after a scheduled personal training session. DPT discussed how the PFM is very different from larger muscle groups and urinary incontinence takes time to completely resolve especially if Pt has been having this dysfunction for years. PFM contractions (kegels) removed from HEP, for now. Pt verbalized understanding. Given time, internal assessment will be performed next session and focus on downregulation of nervous system and seated diaphragmatic breathing (Pt with increased shoulder elevation and B plantarflexion). After body scanning audio video, Pt felt more relaxed. Pt responded well to active and educational interventions. Pt will continue to benefit from skilled therapeutic intervention  to address deficits in IAP management, PFM strength, PFM coordination, PFM endurance, ROM and posture in order to increase confidence in bladder, minimize incontinence pad use, and increase PLOF and improve overall QOL.    Objective Impairments: decreased activity tolerance, decreased coordination, decreased endurance, decreased ROM, decreased strength, improper body mechanics, and postural dysfunction.   Activity Limitations: carrying, lifting, bending, squatting, continence, toileting, and locomotion level  Personal Factors: Age, Behavior pattern, Fitness, Past/current experiences, Time since onset of injury/illness/exacerbation, and 3+ comorbidities: COPD, HTN, diabetes  are also affecting patient's functional outcome.   Rehab Potential: Good  Clinical Decision Making: Evolving/moderate complexity  Evaluation Complexity: Moderate   GOALS: Goals reviewed with patient? Yes  SHORT TERM GOALS: Target date: 02/14/2022  Patient will demonstrate  independence with HEP in order to maximize therapeutic gains and improve carryover from physical therapy sessions to ADLs in the home and community. Baseline: toileting posture handout, (8/17): continues to perform HEP requiring cueing in session; (9/14): Pt is IND with HEP Goal status: MET    LONG TERM GOALS: Target date:  03/06/22  Patient will score >/= 60 on FOTO Urinary Problem  in order to demonstrate improved IAP management, improved PFM coordination, and overall improved QOL.  Baseline: 52, (8/17): 56, (9/14): 62 Goal status: MET  2.  Patient will report decreased reliance on protective undergarments as indicated by a 24 hour period to demonstrate improved bladder control and allow for increased participation in activities outside of the home. Baseline: Lvl 2 (normally) and Lvl 3 (exercise) incontinence pads about 6-8x/day, (8/17): Lvl 3 (outside) changing 2-3x/day, and does not wear pad but still has to change 1x but not everyday;  (9/14): Lvl 2 during the day and Lvl 3 out in the community 3x/day for hygiene but only 1 pad on average is wet  Goal status: IN PROGRESS  3.  Patient will report less than 5 incidents of stress urinary incontinence over the course of 3 weeks while lifting/bending/squatting/prolonged activity (exercise) in order to demonstrate improved PFM coordination, strength, and function for improved overall QOL. Baseline: leakage with all the above every time; (8/17): leakage still with deep squat but not lifting/bending; (9/14): Pt removed deep squats from routine but even with wall squats Pt is not leaking, no leakage with lifting/bending  Goal status: MET  4.  Patient will report being able to return to activities including, but not limited to: physical activity, long walk, dancing, sexual intercourse without leakage or limitation to indicate complete resolution of the chief concern and return to prior level of participation at home and in the community. Baseline: unable to participate in activities due to leakage and apprehension; (8/17): dancing no leakage, apprehensive with intercourse; (9/14): dancing no leakage, with intercourse only a dribble if that Goal status: IN PROGRESS  5.  Patient will report confidence in ability to control bladder > 7/10 in order to demonstrate improved function and ability to participate more fully in activities at home and in the community. Baseline: 4/10; (8/17): 7/10; (9/14): 8/10 Goal status: MET   PLAN: PT Frequency: 1x/week  PT Duration: 12 weeks  Planned Interventions: Therapeutic exercises, Therapeutic activity, Neuromuscular re-education, Balance training, Gait training, Patient/Family education, Self Care, Joint mobilization, Spinal mobilization, Cryotherapy, Moist heat, scar mobilization, Taping, and Manual therapy  Plan For Next Session:  check hips, PFM internal exam, wall planks/pallof press    Vito Beg, PT, DPT  02/20/2022, 11:59 AM

## 2022-02-21 ENCOUNTER — Ambulatory Visit: Payer: 59

## 2022-02-27 ENCOUNTER — Other Ambulatory Visit: Payer: Self-pay | Admitting: Family Medicine

## 2022-02-28 ENCOUNTER — Other Ambulatory Visit: Payer: Self-pay | Admitting: Family Medicine

## 2022-02-28 ENCOUNTER — Ambulatory Visit: Payer: 59

## 2022-02-28 DIAGNOSIS — M6289 Other specified disorders of muscle: Secondary | ICD-10-CM

## 2022-02-28 DIAGNOSIS — M6281 Muscle weakness (generalized): Secondary | ICD-10-CM

## 2022-02-28 DIAGNOSIS — R278 Other lack of coordination: Secondary | ICD-10-CM

## 2022-02-28 NOTE — Telephone Encounter (Signed)
Requested medication (s) are due for refill today:   Provider to review  Requested medication (s) are on the active medication list:   Yes  Future visit scheduled:   Yes   Last ordered: 02/27/2022 #15, 0 refills  Non delegated refill.   Duplicate order received.     Requested Prescriptions  Pending Prescriptions Disp Refills   LORazepam (ATIVAN) 0.5 MG tablet [Pharmacy Med Name: LORAZEPAM 0.5 MG TAB] 15 tablet     Sig: TAKE ONE TABLET BY MOUTH EVERY DAY AS NEEDED FOR ANXIETY     Not Delegated - Psychiatry: Anxiolytics/Hypnotics 2 Failed - 02/28/2022  8:58 AM      Failed - This refill cannot be delegated      Failed - Urine Drug Screen completed in last 360 days      Passed - Patient is not pregnant      Passed - Valid encounter within last 6 months    Recent Outpatient Visits           4 months ago Essential hypertension   [facility] Sh[place]house, Megan P, DO   4 months ago Hyperlipidemia associated with type 2 diabetes mellitus (HCC)   [facility] Johnson, Megan P, DO   8 months ago Essential hypertension   [facility] Vigg, Avanti, MD   10 months ago Morbid obesity (HCC)   [facility] Vigg, Avanti, MD   1 year ago Morbid obesity (HCC)   [facility] Vigg, Avanti, MD       Future Appointments             In 1 month Johnson, Oralia Rud, DO [employer], [facility]

## 2022-02-28 NOTE — Therapy (Signed)
OUTPATIENT PHYSICAL THERAPY FEMALE PELVIC TREATMENT   Patient Name: [name] MRN: [id number] DOB:1956-02-03, 66 y.o., female Today's Date: 02/28/2022   PT End of Session - 02/28/22 0755     Visit Number 16    Number of Visits 20    Date for PT Re-Evaluation 03/06/22    Authorization Type IE: 11/01/21, PN/RC: 01/03/22    PT Start Time 0800    PT Stop Time 0840    PT Time Calculation (min) 40 min    Activity Tolerance Patient tolerated treatment well             Past Medical History:  Diagnosis Date   Asthma    Depression    Diabetes mellitus without complication ([place])    Hyperlipidemia    Kidney stones    Migraine headache    Past Surgical History:  Procedure Laterality Date   ABDOMINAL HYSTERECTOMY     BREAST CYST ASPIRATION Right 1981   benign   BREAST CYST ASPIRATION Right 1984   benign   CHOLECYSTECTOMY     COLONOSCOPY  March 2016   FLEXIBLE BRONCHOSCOPY N/A 01/26/2018   Procedure: FLEXIBLE BRONCHOSCOPY;  Surgeon: Laverle Hobby, MD;  Location: [facility] ORS;  Service: Pulmonary;  Laterality: N/A;   MENISCUS REPAIR Left 09/18/2018   OOPHORECTOMY     PARTIAL KNEE ARTHROPLASTY Left 02/2019   Patient Active Problem List   Diagnosis Date Noted   Statin myopathy 10/16/2021   Obesity (BMI 35.0-39.9 without comorbidity) 06/13/2021   COPD (chronic obstructive pulmonary disease) ([place]) 01/19/2021   Morbid obesity ([place]) 01/19/2021   Gastroesophageal reflux disease without esophagitis 01/19/2021   Absence of bladder continence 11/30/2020   Recurrent major depressive disorder, in full remission ([place]) 11/30/2020   Chronic venous insufficiency 10/05/2020   Pain and swelling of lower leg 09/18/2020   Raynauds phenomenon 09/18/2020   Hyperlipidemia 09/18/2020   COVID-19 04/30/2020   History of total knee arthroplasty 09/08/2019   Vitamin D deficiency 12/03/2018   Knee arthropathy 07/28/2018   Anxiety 04/07/2018   Hyperlipidemia associated with type 2  diabetes mellitus ([place]) 04/07/2018   Mixed stress and urge urinary incontinence 02/25/2017   Right sided weakness 09/26/2016   Asthma 11/15/2015   Essential hypertension 10/20/2014   Diabetes mellitus associated with hormonal etiology ([place]) 10/20/2014   Celiac sprue 10/20/2014   BMI 40.0-44.9, adult ([place]) 10/20/2014   Fatty infiltration of liver 08/31/2014    PCP: Park Liter, DO  REFERRING PROVIDER: Valerie Roys, DO   REFERRING DIAG:  N39.46 (ICD-10-CM) - Mixed stress and urge urinary incontinence   THERAPY DIAG:  Pelvic floor dysfunction  Other lack of coordination  Muscle weakness (generalized)  Rationale for Evaluation and Treatment: Rehabilitation  ONSET DATE: 5 years ago   PRECAUTIONS: None  WEIGHT BEARING RESTRICTIONS: No  FALLS:  Has patient fallen in last 6 months? 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 Pt has lost weight but noticed the leakage was heavier with increased weight. Pt has tried to pinpoint when the leakage is worse (with certain food or exercise) but cannot figure it out. Pt was taking one BP medication that encouraged more fluid removal but Dr has since changed it and leakage has been better but is still present and limits her. Pt tries not to lift anything because she knows she will leak. Pt hasn't noticed too much leakage  with walking but still is present. Pt has a hx of intense core exercises. Pt does have COPD and cannot lie flat for very long, Pt sleeps with multiple pillows and feels her COPD is well managed. Walking 7 days a week (2-3 miles), and working with a Physiological scientist 3x per week, a lot of strength training. Has to wear a Level 3 pad with any physical activity.    PATIENT GOALS: Pt would like to stop the leakage and not wear pads, and strengthen parts of her body that  she is not aware of     UROLOGICAL HISTORY Fluid intake: Yes: water all day w/lemon/limes, diet soft drink very occasionally   Pain with urination: No Fully empty bladder: Yes Stream:  sitting/but does stop and go  Urgency: Yes  Frequency: 6-10x/day  Nocturia: 1x Leakage: Walking to the bathroom, Exercise, Lifting, Bending forward, and Intercourse Pads: Yes Type: incontinence pads, #2, when out in the community #3  Amount: 6-8x/day  Bladder control (0-10): 4/10   SEXUAL HISTORY/FUNCTION  Pain with intercourse: No Able to achieve orgasm?: Yes  OBSTETRICAL HISTORY Vaginal deliveries: G4P4 Tearing: Yes: stitches with all of them    GYNECOLOGICAL HISTORY Hysterectomy: yes, abdominal hysterectomy  Pelvic Organ Prolapse: None Pain with exam: no  Heaviness/pressure: no    SUBJECTIVE: Pt has been wearing Lvl 3 during the day and has noticed decreased urinary leakage in the morning. Pt noticed that she did not have urinary leakage at work as much but when she did her workouts "it was pretty hard" she did have urinary leakage later throughout the day. Lvl 3 being worn at the gym.    PAIN:  Are you having pain? No   OBJECTIVE:    COGNITION: (11/01/21) Overall cognitive status: Within functional limits for tasks assessed     POSTURE:  In seated B plantarflexion with B rounded shoulders    Thoracic kyphosis: slightly in standing  Iliac crest height: L iliac crest  Pelvic obliquity: L posteriorly rotated    GAIT: Trendelenburg: positive on L    RANGE OF MOTION:    (Norm range in degrees)  LEFT 11/01/21 RIGHT 11/01/21  Lumbar forward flexion (65):  WNL    Lumbar extension (30): WNL    Lumbar lateral flexion (25):  Restricted (above knee) Restricted (above knee)  Thoracic and Lumbar rotation (30 degrees):    WNL WNL  Hip Flexion (0-125):   WNL WNL  Hip IR (0-45):  Restricted Restricted  Hip ER (0-45):  WNL WNL  Hip Adduction:      Hip Abduction (0-40):  WNL WNL   Hip extension (0-15):     (*= pain, Blank rows = not tested)   STRENGTH: MMT    RLE 11/08/21 LLE 11/08/21  Hip Flexion 5 5  Hip Extension 4 4  Hip Abduction  4 4  Hip Adduction     Hip ER  5 5  Hip IR  5 5  Knee Extension 5 5  Knee Flexion 4 4  Dorsiflexion     Plantarflexion (seated) 5 5  (*= pain, Blank rows = not tested)   SPECIAL TESTS:  FABER (SN 81): negative FADIR (SN 94): negative   PALPATION:  Abdominal:  Diastasis:  none Scar mobility: none   EXTERNAL PELVIC EXAM: Patient educated on the purpose of the pelvic exam and articulated understanding; patient consented to the exam verbally.  Breath coordination: present but inconsistent  Voluntary Contraction: present, 2/5 MMT after significant cueing, 1st attempt  activated gluteals and abdominals Relaxation: delayed Perineal movement with sustained IAP increase ("bear down"): elevation with gluteal activation and Valsalva Perineal movement with rapid IAP increase ("cough"): no change (0= no contraction, 1= flicker, 2= weak squeeze, 3= fair squeeze with lift, 4= good squeeze and lift against resistance, 5= strong squeeze against strong resistance)    TODAY'S TREATMENT  Neuromuscular Re-education: Pre-treatment assessment:   INTERNAL PELVIC EXAM: Patient educated on the purpose of the pelvic exam and articulated understanding; patient consented to the exam verbally. Deferred 2/2 to time constraints Introitus Appears: WNLs Skin integrity: WNLs, some irritation bumps on mons pubis Strength (PERF): 2/5 MMT, no gluteal activation Symmetry: R > L more tension at 7/8 o'clock and 10 with tenderness, on L 2 o'clock  Prolapse: no bulge noticed in supine  Coordination: Pt required significant cueing for breath coordination and with increased time able to demonstrate   Seated piriformis pose for improved tissue extensibility with coordinated breathing, Vcs and Tcs required   Discussion on soreness after an internal  exam and pain modalities to use such as heat or ice. Pt verbalized understanding.    Patient response to interventions: Pt is very new to internal exam but is open to internal manual techniques in future sessions   Patient Education:  Patient provided HEP: seated piriformis pose. Patient educated throughout session on appropriate technique and form using multi-modal cueing, HEP, and activity modification. Patient will benefit from further education in order to maximize compliance and understanding for long-term therapeutic gains.     ASSESSMENT:  Clinical Impression: Patient presents to clinic with excellent motivation to participate in today's session. Pt continues to demonstrate deficits in IAP management, PFM strength, PFM coordination, LE strength, ROM and posture. Pt reports feeling more at ease after last week's discussion and has been able to return back to Lvl 2/Lvl 3 incontinence pads without any urinary incontinence accidents. Pt even reported some instances where there was no urinary leakage. Upon internal assessment, Pt required significant cueing for pursed-lip breathing and with increased time, DPT able to feel breath coordination internally at PFM. Pt able to perform 2/5 MMT PFM contraction with no gluteal activation compared to external PFM during IE (significant gluteal activation). Pt with increased muscular tension on the R > L with increased tenderness at 7, 8, and 10 o'clock. With cueing for diaphragmatic breathing and gentle pressure, tenderness eased. On the L Pt with tenderness at 2 o'clock and also eased with diaphragmatic breathing. Discussion on some soreness being normal after an internal assessment. Pt is willing to continue with internal manual techniques in future sessions while continuing to improve IAP management. Pt responded well to active and educational interventions. Pt will continue to benefit from skilled therapeutic intervention to address deficits in IAP  management, PFM strength, PFM coordination, PFM endurance, ROM and posture in order to increase confidence in bladder, minimize incontinence pad use, and increase PLOF and improve overall QOL.    Objective Impairments: decreased activity tolerance, decreased coordination, decreased endurance, decreased ROM, decreased strength, improper body mechanics, and postural dysfunction.   Activity Limitations: carrying, lifting, bending, squatting, continence, toileting, and locomotion level  Personal Factors: Age, Behavior pattern, Fitness, Past/current experiences, Time since onset of injury/illness/exacerbation, and 3+ comorbidities: COPD, HTN, diabetes  are also affecting patient's functional outcome.   Rehab Potential: Good  Clinical Decision Making: Evolving/moderate complexity  Evaluation Complexity: Moderate   GOALS: Goals reviewed with patient? Yes  SHORT TERM GOALS: Target date: 02/14/2022  Patient will demonstrate independence with  HEP in order to maximize therapeutic gains and improve carryover from physical therapy sessions to ADLs in the home and community. Baseline: toileting posture handout, (8/17): continues to perform HEP requiring cueing in session; (9/14): Pt is IND with HEP Goal status: MET    LONG TERM GOALS: Target date:  03/06/22  Patient will score >/= 60 on FOTO Urinary Problem  in order to demonstrate improved IAP management, improved PFM coordination, and overall improved QOL.  Baseline: 52, (8/17): 56, (9/14): 62 Goal status: MET  2.  Patient will report decreased reliance on protective undergarments as indicated by a 24 hour period to demonstrate improved bladder control and allow for increased participation in activities outside of the home. Baseline: Lvl 2 (normally) and Lvl 3 (exercise) incontinence pads about 6-8x/day, (8/17): Lvl 3 (outside) changing 2-3x/day, and does not wear pad but still has to change 1x but not everyday; (9/14): Lvl 2 during the day and  Lvl 3 out in the community 3x/day for hygiene but only 1 pad on average is wet  Goal status: IN PROGRESS  3.  Patient will report less than 5 incidents of stress urinary incontinence over the course of 3 weeks while lifting/bending/squatting/prolonged activity (exercise) in order to demonstrate improved PFM coordination, strength, and function for improved overall QOL. Baseline: leakage with all the above every time; (8/17): leakage still with deep squat but not lifting/bending; (9/14): Pt removed deep squats from routine but even with wall squats Pt is not leaking, no leakage with lifting/bending  Goal status: MET  4.  Patient will report being able to return to activities including, but not limited to: physical activity, long walk, dancing, sexual intercourse without leakage or limitation to indicate complete resolution of the chief concern and return to prior level of participation at home and in the community. Baseline: unable to participate in activities due to leakage and apprehension; (8/17): dancing no leakage, apprehensive with intercourse; (9/14): dancing no leakage, with intercourse only a dribble if that Goal status: IN PROGRESS  5.  Patient will report confidence in ability to control bladder > 7/10 in order to demonstrate improved function and ability to participate more fully in activities at home and in the community. Baseline: 4/10; (8/17): 7/10; (9/14): 8/10 Goal status: MET   PLAN: PT Frequency: 1x/week  PT Duration: 12 weeks  Planned Interventions: Therapeutic exercises, Therapeutic activity, Neuromuscular re-education, Balance training, Gait training, Patient/Family education, Self Care, Joint mobilization, Spinal mobilization, Cryotherapy, Moist heat, scar mobilization, Taping, and Manual therapy  Plan For Next Session:  re-eval,  check hips, how did you feel after internal? Techniques with movement, wall planks/pallof press    Anakin Varkey, PT, DPT  02/28/2022,  7:56 AM

## 2022-03-04 MED ORDER — LORAZEPAM 0.5 MG PO TABS: 0.5000 mg | ORAL_TABLET | ORAL | 0 refills | Status: DC | PRN

## 2022-03-07 ENCOUNTER — Ambulatory Visit: Payer: 59

## 2022-03-21 ENCOUNTER — Telehealth: Payer: Self-pay

## 2022-03-21 ENCOUNTER — Ambulatory Visit: Payer: 59

## 2022-03-21 NOTE — Telephone Encounter (Signed)
Tried multiple times to call various phone numbers on file. Call could not be completed and unable to LVM about no-show today.

## 2022-03-28 ENCOUNTER — Ambulatory Visit: Payer: 59

## 2022-04-04 ENCOUNTER — Ambulatory Visit: Payer: 59

## 2022-04-16 NOTE — Therapy (Signed)
[place] MAIN [facility] SERVICES [address] [place], [place], [address] Phone: [phone]   Fax:  [phone]  April 16, 2022   No Recipients  Physical Therapy Discharge Summary  Patient: [name]  MRN: [id number]  Date of Birth: 1956/04/04   Diagnosis: No diagnosis found. No data recorded  The above patient had been seen in Physical Therapy 16 times of 20 treatments scheduled with 1 no shows and 2 cancellations.  The treatment consisted of downregulation of nervous system, PFM coordination/strength/extensibility, posture, and LE strength The patient is: Improved  Subjective:  11/01/21 IE - Pt reports having urinary leakage everyday. Pt has lost weight but noticed the leakage was heavier with increased weight. Pt has tried to pinpoint when the leakage is worse (with certain food or exercise) but cannot figure it out. Pt was taking one BP medication that encouraged more fluid removal but Dr has since changed it and leakage has been better but is still present and limits her. Pt tries not to lift anything because she knows she will leak. Pt hasn't noticed too much leakage with walking but still is present. Pt has a hx of intense core exercises. Pt does have COPD and cannot lie flat for very long, Pt sleeps with multiple pillows and feels her COPD is well managed. Walking 7 days a week (2-3 miles), and working with a Physiological scientist 3x per week, a lot of strength training. Has to wear a Level 3 pad with any physical activity.   Last visit: 11/09 - Pt has been wearing Lvl 3 during the day and has noticed decreased urinary leakage in the morning. Pt noticed that she did not have urinary leakage at work as much but when she did her workouts "it was pretty hard" she did have urinary leakage later throughout the day. Lvl 3 being worn at the gym.   Discharge Findings: Did not assess  Functional Status at Discharge: Did not assess   Goals  Partially Met  SHORT TERM GOALS: Target date: 02/14/2022  Patient will demonstrate independence with HEP in order to maximize therapeutic gains and improve carryover from physical therapy sessions to ADLs in the home and community. Baseline: toileting posture handout, (8/17): continues to perform HEP requiring cueing in session; (9/14): Pt is IND with HEP Goal status: MET    LONG TERM GOALS: Target date:  03/06/22  Patient will score >/= 60 on FOTO Urinary Problem  in order to demonstrate improved IAP management, improved PFM coordination, and overall improved QOL.  Baseline: 52, (8/17): 56, (9/14): 62 Goal status: MET  2.  Patient will report decreased reliance on protective undergarments as indicated by a 24 hour period to demonstrate improved bladder control and allow for increased participation in activities outside of the home. Baseline: Lvl 2 (normally) and Lvl 3 (exercise) incontinence pads about 6-8x/day, (8/17): Lvl 3 (outside) changing 2-3x/day, and does not wear pad but still has to change 1x but not everyday; (9/14): Lvl 2 during the day and Lvl 3 out in the community 3x/day for hygiene but only 1 pad on average is wet  Goal status: NOT MET  3.  Patient will report less than 5 incidents of stress urinary incontinence over the course of 3 weeks while lifting/bending/squatting/prolonged activity (exercise) in order to demonstrate improved PFM coordination, strength, and function for improved overall QOL. Baseline: leakage with all the above every time; (8/17): leakage still with deep squat but not lifting/bending; (9/14): Pt removed deep  squats from routine but even with wall squats Pt is not leaking, no leakage with lifting/bending  Goal status: MET  4.  Patient will report being able to return to activities including, but not limited to: physical activity, long walk, dancing, sexual intercourse without leakage or limitation to indicate complete resolution of the chief concern and  return to prior level of participation at home and in the community. Baseline: unable to participate in activities due to leakage and apprehension; (8/17): dancing no leakage, apprehensive with intercourse; (9/14): dancing no leakage, with intercourse only a dribble if that Goal status: NOT MET  5.  Patient will report confidence in ability to control bladder > 7/10 in order to demonstrate improved function and ability to participate more fully in activities at home and in the community. Baseline: 4/10; (8/17): 7/10; (9/14): 8/10 Goal status: MET  Sincerely,   Filbert Berthold, PT, DPT   CC No Recipients  [place] MAIN [facility] SERVICES [address] [place], [place], [address] Phone: [phone]   Fax:  [phone]  Patient: [name]  MRN: [id number]  Date of Birth: 29-Jan-1956

## 2022-04-17 ENCOUNTER — Ambulatory Visit (INDEPENDENT_AMBULATORY_CARE_PROVIDER_SITE_OTHER): Payer: 59 | Admitting: Family Medicine

## 2022-04-17 ENCOUNTER — Encounter: Payer: Self-pay | Admitting: Family Medicine

## 2022-04-17 VITALS — BP 121/75 | HR 75 | Temp 98.4°F | Ht 58.25 in | Wt 175.7 lb

## 2022-04-17 DIAGNOSIS — F419 Anxiety disorder, unspecified: Secondary | ICD-10-CM

## 2022-04-17 DIAGNOSIS — I10 Essential (primary) hypertension: Secondary | ICD-10-CM | POA: Diagnosis not present

## 2022-04-17 DIAGNOSIS — E1169 Type 2 diabetes mellitus with other specified complication: Secondary | ICD-10-CM | POA: Diagnosis not present

## 2022-04-17 DIAGNOSIS — Z1231 Encounter for screening mammogram for malignant neoplasm of breast: Secondary | ICD-10-CM

## 2022-04-17 DIAGNOSIS — E785 Hyperlipidemia, unspecified: Secondary | ICD-10-CM

## 2022-04-17 DIAGNOSIS — T466X5A Adverse effect of antihyperlipidemic and antiarteriosclerotic drugs, initial encounter: Secondary | ICD-10-CM

## 2022-04-17 DIAGNOSIS — N39498 Other specified urinary incontinence: Secondary | ICD-10-CM

## 2022-04-17 DIAGNOSIS — Z0000 Encounter for general adult medical examination without abnormal findings: Secondary | ICD-10-CM | POA: Diagnosis not present

## 2022-04-17 DIAGNOSIS — J449 Chronic obstructive pulmonary disease, unspecified: Secondary | ICD-10-CM | POA: Diagnosis not present

## 2022-04-17 DIAGNOSIS — Z23 Encounter for immunization: Secondary | ICD-10-CM

## 2022-04-17 DIAGNOSIS — E559 Vitamin D deficiency, unspecified: Secondary | ICD-10-CM

## 2022-04-17 DIAGNOSIS — F3342 Major depressive disorder, recurrent, in full remission: Secondary | ICD-10-CM

## 2022-04-17 DIAGNOSIS — G720 Drug-induced myopathy: Secondary | ICD-10-CM

## 2022-04-17 LAB — URINALYSIS, ROUTINE W REFLEX MICROSCOPIC
Bilirubin, UA: NEGATIVE
Glucose, UA: NEGATIVE
Ketones, UA: NEGATIVE
Leukocytes,UA: NEGATIVE
Nitrite, UA: NEGATIVE
Protein,UA: NEGATIVE
RBC, UA: NEGATIVE
Specific Gravity, UA: 1.02 (ref 1.005–1.030)
Urobilinogen, Ur: 1 mg/dL (ref 0.2–1.0)
pH, UA: 7 (ref 5.0–7.5)

## 2022-04-17 LAB — MICROALBUMIN, URINE WAIVED
Creatinine, Urine Waived: 100 mg/dL (ref 10–300)
Microalb, Ur Waived: 10 mg/L (ref 0–19)
Microalb/Creat Ratio: <30 mg/g (ref <30)

## 2022-04-17 LAB — BAYER DCA HB A1C WAIVED: HB A1C (BAYER DCA - WAIVED): 5.5 % (ref 4.8–5.6)

## 2022-04-17 MED ORDER — BUPROPION HCL ER (SR) 150 MG PO TB12: 150.0000 mg | ORAL_TABLET | Freq: Three times a day (TID) | ORAL | 1 refills | Status: DC

## 2022-04-17 MED ORDER — LOSARTAN POTASSIUM 25 MG PO TABS: 12.5000 mg | ORAL_TABLET | Freq: Every day | ORAL | 1 refills | Status: DC

## 2022-04-17 MED ORDER — HYOSCYAMINE SULFATE 0.125 MG SL SUBL: SUBLINGUAL_TABLET | SUBLINGUAL | 1 refills | Status: DC

## 2022-04-17 MED ORDER — OMEPRAZOLE 40 MG PO CPDR: 40.0000 mg | DELAYED_RELEASE_CAPSULE | Freq: Every day | ORAL | 3 refills | Status: DC

## 2022-04-17 MED ORDER — TIRZEPATIDE 10 MG/0.5ML ~~LOC~~ SOAJ: 10.0000 mg | SUBCUTANEOUS | 6 refills | Status: DC

## 2022-04-17 MED ORDER — MONTELUKAST SODIUM 10 MG PO TABS: 10.0000 mg | ORAL_TABLET | Freq: Every day | ORAL | 1 refills | Status: DC

## 2022-04-17 MED ORDER — ALBUTEROL SULFATE HFA 108 (90 BASE) MCG/ACT IN AERS: 2.0000 | INHALATION_SPRAY | Freq: Four times a day (QID) | RESPIRATORY_TRACT | 6 refills | Status: DC | PRN

## 2022-04-17 NOTE — Assessment & Plan Note (Signed)
Unable to tolerate statins. Rechecking labs today. Await results. Treat as needed.  

## 2022-04-17 NOTE — Assessment & Plan Note (Signed)
Under good control on current regimen. Continue current regimen. Continue to monitor. Call with any concerns. Refills given.   

## 2022-04-17 NOTE — Progress Notes (Signed)
BP 121/75   Pulse 75   Temp 98.4 F (36.9 C) (Oral)   Ht 4' 10.25" (1.48 m)   Wt 175 lb 11.2 oz (79.7 kg)   SpO2 96%   BMI 36.41 kg/m    Subjective:    Patient ID: [name], female    DOB: June 12, 1955, 66 y.o.   MRN: [id number]  HPI: [name] is a 66 y.o. female presenting on 04/17/2022 for comprehensive medical examination. Current medical complaints include:  DIABETES Hypoglycemic episodes:no Polydipsia/polyuria: no Visual disturbance: no Chest pain: no Paresthesias: no Glucose Monitoring: no  Accucheck frequency: Not Checking Taking Insulin?: no Blood Pressure Monitoring: not checking Retinal Examination: Not up to Date Foot Exam: Up to Date Diabetic Education: Completed Pneumovax: Up to Date Influenza:  Refused Aspirin: no  HYPERTENSION / [place] Satisfied with current treatment? yes Duration of hypertension: chronic BP monitoring frequency: not checking BP medication side effects: no Past BP meds: losartan Duration of hyperlipidemia: chronic Cholesterol medication side effects: yes Cholesterol supplements: none Past cholesterol medications: none Medication compliance: good compliance Aspirin: no Recent stressors: no Recurrent headaches: no Visual changes: no Palpitations: no Dyspnea: no Chest pain: no Lower extremity edema: no Dizzy/lightheaded: no  ANXIETY/STRESS Duration: chronic Status:controlled Anxious mood: no  Excessive worrying: no Irritability: no  Sweating: no Nausea: no Palpitations:no Hyperventilation: no Panic attacks: no Agoraphobia: no  Obscessions/compulsions: no Depressed mood: no    04/17/2022    8:10 AM 10/04/2021    9:59 AM 06/13/2021    9:53 AM 01/16/2021    9:18 AM 11/30/2020    8:24 AM  Depression screen PHQ 2/9  Decreased Interest 0 0 0 0 0  Down, Depressed, Hopeless 0 0 0 0 0  PHQ - 2 Score 0 0 0 0 0  Altered sleeping 0 0 0 0 0  Tired, decreased energy 0 0 0 0 0  Change in appetite  0 0 0 0 0  Feeling bad or failure about yourself  0 0 0 0 0  Trouble concentrating 0 0 0 0 0  Moving slowly or fidgety/restless  0 0 0 0  Suicidal thoughts 0 0 0 0 0  PHQ-9 Score 0 0 0 0 0  Difficult doing work/chores Not difficult at all Not difficult at all Not difficult at all     Anhedonia: no Weight changes: no Insomnia: no   Hypersomnia: no Fatigue/loss of energy: no Feelings of worthlessness: no Feelings of guilt: no Impaired concentration/indecisiveness: no Suicidal ideations: no  Crying spells: no Recent Stressors/Life Changes: no   Relationship problems: no   Family stress: no     Financial stress: no    Job stress: no    Recent death/loss: no  COPD COPD status: controlled Satisfied with current treatment?: yes Oxygen use: no Dyspnea frequency: rarely Cough frequency: rarely Rescue inhaler frequency: rarely   Limitation of activity: no Productive cough: none Pneumovax: Up to Date Influenza:  Refused  Menopausal Symptoms: no  Depression Screen done today and results listed below:     04/17/2022    8:10 AM 10/04/2021    9:59 AM 06/13/2021    9:53 AM 01/16/2021    9:18 AM 11/30/2020    8:24 AM  Depression screen PHQ 2/9  Decreased Interest 0 0 0 0 0  Down, Depressed, Hopeless 0 0 0 0 0  PHQ - 2 Score 0 0 0 0 0  Altered sleeping 0 0 0 0 0  Tired, decreased energy 0 0 0  0 0  Change in appetite 0 0 0 0 0  Feeling bad or failure about yourself  0 0 0 0 0  Trouble concentrating 0 0 0 0 0  Moving slowly or fidgety/restless  0 0 0 0  Suicidal thoughts 0 0 0 0 0  PHQ-9 Score 0 0 0 0 0  Difficult doing work/chores Not difficult at all Not difficult at all Not difficult at all       Past Medical History:  Past Medical History:  Diagnosis Date   Asthma    Depression    Diabetes mellitus without complication ([place])    Hyperlipidemia    Kidney stones    Migraine headache     Surgical History:  Past Surgical History:  Procedure Laterality Date    ABDOMINAL HYSTERECTOMY     BREAST CYST ASPIRATION Right 1981   benign   BREAST CYST ASPIRATION Right 1984   benign   CHOLECYSTECTOMY     COLONOSCOPY  March 2016   FLEXIBLE BRONCHOSCOPY N/A 01/26/2018   Procedure: FLEXIBLE BRONCHOSCOPY;  Surgeon: Laverle Hobby, MD;  Location: [facility] ORS;  Service: Pulmonary;  Laterality: N/A;   MENISCUS REPAIR Left 09/18/2018   OOPHORECTOMY     PARTIAL KNEE ARTHROPLASTY Left 02/2019    Medications:  Current Outpatient Medications on File Prior to Visit  Medication Sig   albuterol (PROVENTIL) (2.5 MG/3ML) 0.083% nebulizer solution USE 1 VIAL VIA NEBULIZER 1 TO 2 TIMES A DAY AS DIRECTED   Budeson-Glycopyrrol-Formoterol (BREZTRI AEROSPHERE) 160-9-4.8 MCG/ACT AERO Inhale 2 puffs into the lungs 2 (two) times daily.   diphenoxylate-atropine (LOMOTIL) 2.5-0.025 MG tablet    fluticasone (FLONASE) 50 MCG/ACT nasal spray Place 2 sprays into both nostrils 2 (two) times daily.   LORazepam (ATIVAN) 0.5 MG tablet Take 1 tablet (0.5 mg total) by mouth as needed for anxiety.   methocarbamol (ROBAXIN) 500 MG tablet methocarbamol 500 mg tablet  TAKE 1 TABLET BY MOUTH EVERY 6 HOURS AS NEEDED FOR MUSCLE SPASMS   ondansetron (ZOFRAN) 8 MG tablet Take 1 tablet (8 mg total) by mouth every 8 (eight) hours as needed for nausea or vomiting.   Vibegron (GEMTESA) 75 MG TABS Take 1 tablet by mouth daily.   No current facility-administered medications on file prior to visit.    Allergies:  Allergies  Allergen Reactions   Cymbalta [Duloxetine Hcl] Other (See Comments)    Hallucinations   Amoxicillin Hives   Atorvastatin Other (See Comments)    myalgias    Social History:  Social History   Socioeconomic History   Marital status: Single    Spouse name: Not on file   Number of children: Not on file   Years of education: Not on file   Highest education level: Not on file  Occupational History   Not on file  Tobacco Use   Smoking status: Never   Smokeless  tobacco: Never  Vaping Use   Vaping Use: Never used  Substance and Sexual Activity   Alcohol use: Yes    Comment: social   Drug use: No   Sexual activity: Not Currently  Other Topics Concern   Not on file  Social History Narrative   Not on file   Social Determinants of Health   Financial Resource Strain: Not on file  Food Insecurity: Not on file  Transportation Needs: Not on file  Physical Activity: Not on file  Stress: Not on file  Social Connections: Not on file  Intimate Partner Violence: Not on file  Social History   Tobacco Use  Smoking Status Never  Smokeless Tobacco Never   Social History   Substance and Sexual Activity  Alcohol Use Yes   Comment: social    Family History:  Family History  Problem Relation Age of Onset   Hypertension Mother    Cancer Mother        breast   Arthritis Mother    Alzheimer's disease Mother    Parkinson's disease Mother    Breast cancer Mother 70   Hypertension Father    Diabetes Father    Kidney cancer Father    Breast cancer Cousin 35   Breast cancer Maternal Aunt    Breast cancer Maternal Grandmother 23    Past medical history, surgical history, medications, allergies, family history and social history reviewed with patient today and changes made to appropriate areas of the chart.   Review of Systems  Constitutional: Negative.   HENT: Negative.    Eyes: Negative.   Respiratory: Negative.    Cardiovascular: Negative.   Gastrointestinal: Negative.   Genitourinary: Negative.   Musculoskeletal: Negative.   Skin: Negative.   Neurological: Negative.   Endo/Heme/Allergies:  Positive for environmental allergies. Negative for polydipsia. Does not bruise/bleed easily.  Psychiatric/Behavioral: Negative.     All other ROS negative except what is listed above and in the HPI.      Objective:    BP 121/75   Pulse 75   Temp 98.4 F (36.9 C) (Oral)   Ht 4' 10.25" (1.48 m)   Wt 175 lb 11.2 oz (79.7 kg)   SpO2 96%    BMI 36.41 kg/m   Wt Readings from Last 3 Encounters:  04/17/22 175 lb 11.2 oz (79.7 kg)  10/16/21 170 lb 6.4 oz (77.3 kg)  10/04/21 164 lb (74.4 kg)    Physical Exam Vitals and nursing note reviewed.  Constitutional:      General: She is not in acute distress.    Appearance: Normal appearance. She is obese. She is not ill-appearing, toxic-appearing or diaphoretic.  HENT:     Head: Normocephalic and atraumatic.     Right Ear: Tympanic membrane, ear canal and external ear normal. There is no impacted cerumen.     Left Ear: Tympanic membrane, ear canal and external ear normal. There is no impacted cerumen.     Nose: Nose normal. No congestion or rhinorrhea.     Mouth/Throat:     Mouth: Mucous membranes are moist.     Pharynx: Oropharynx is clear. No oropharyngeal exudate or posterior oropharyngeal erythema.  Eyes:     General: No scleral icterus.       Right eye: No discharge.        Left eye: No discharge.     Extraocular Movements: Extraocular movements intact.     Conjunctiva/sclera: Conjunctivae normal.     Pupils: Pupils are equal, round, and reactive to light.  Neck:     Vascular: No carotid bruit.  Cardiovascular:     Rate and Rhythm: Normal rate and regular rhythm.     Pulses: Normal pulses.     Heart sounds: No murmur heard.    No friction rub. No gallop.  Pulmonary:     Effort: Pulmonary effort is normal. No respiratory distress.     Breath sounds: Normal breath sounds. No stridor. No wheezing, rhonchi or rales.  Chest:     Chest wall: No tenderness.  Abdominal:     General: Abdomen is flat. Bowel sounds are normal.  There is no distension.     Palpations: Abdomen is soft. There is no mass.     Tenderness: There is no abdominal tenderness. There is no right CVA tenderness, left CVA tenderness, guarding or rebound.     Hernia: No hernia is present.  Genitourinary:    Comments: Breast and pelvic exams deferred with shared decision making Musculoskeletal:         General: No swelling, tenderness, deformity or signs of injury.     Cervical back: Normal range of motion and neck supple. No rigidity. No muscular tenderness.     Right lower leg: No edema.     Left lower leg: No edema.  Lymphadenopathy:     Cervical: No cervical adenopathy.  Skin:    General: Skin is warm and dry.     Capillary Refill: Capillary refill takes less than 2 seconds.     Coloration: Skin is not jaundiced or pale.     Findings: No bruising, erythema, lesion or rash.  Neurological:     General: No focal deficit present.     Mental Status: She is alert and oriented to person, place, and time. Mental status is at baseline.     Cranial Nerves: No cranial nerve deficit.     Sensory: No sensory deficit.     Motor: No weakness.     Coordination: Coordination normal.     Gait: Gait normal.     Deep Tendon Reflexes: Reflexes normal.  Psychiatric:        Mood and Affect: Mood normal.        Behavior: Behavior normal.        Thought Content: Thought content normal.        Judgment: Judgment normal.     Results for orders placed or performed in visit on 04/17/22  Urinalysis, Routine w reflex microscopic  Result Value Ref Range   Specific Gravity, UA 1.020 1.005 - 1.030   pH, UA 7.0 5.0 - 7.5   Color, UA Yellow Yellow   Appearance Ur Clear Clear   Leukocytes,UA Negative Negative   Protein,UA Negative Negative/Trace   Glucose, UA Negative Negative   Ketones, UA Negative Negative   RBC, UA Negative Negative   Bilirubin, UA Negative Negative   Urobilinogen, Ur 1.0 0.2 - 1.0 mg/dL   Nitrite, UA Negative Negative   Microscopic Examination Comment   Bayer DCA Hb A1c Waived  Result Value Ref Range   HB A1C (BAYER DCA - WAIVED) 5.5 4.8 - 5.6 %  Microalbumin, Urine Waived  Result Value Ref Range   Microalb, Ur Waived 10 0 - 19 mg/L   Creatinine, Urine Waived 100 10 - 300 mg/dL   Microalb/Creat Ratio <30 <30 mg/g      Assessment & Plan:   Problem List Items Addressed  This Visit       Cardiovascular and Mediastinum   Essential hypertension    Under good control on current regimen. Continue current regimen. Continue to monitor. Call with any concerns. Refills given. Labs drawn today.       Relevant Medications   losartan (COZAAR) 25 MG tablet   Other Relevant Orders   CBC with Differential/Platelet   Comprehensive metabolic panel   TSH     Respiratory   COPD (chronic obstructive pulmonary disease) ([place])    Under good control on current regimen. Continue current regimen. Continue to monitor. Call with any concerns. Refills given. Labs drawn today.      Relevant Medications  albuterol (PROAIR HFA) 108 (90 Base) MCG/ACT inhaler   montelukast (SINGULAIR) 10 MG tablet   Other Relevant Orders   CBC with Differential/Platelet   Comprehensive metabolic panel     Endocrine   Diabetes mellitus associated with hormonal etiology ([place])    Doing well with A1c of 5.5. Will change from ozempic to mounjaro due to shortage. Call with any concerns. Follow up 6 months. Continue to monitor.       Relevant Medications   losartan (COZAAR) 25 MG tablet   tirzepatide (MOUNJARO) 10 MG/0.5ML Pen   Other Relevant Orders   CBC with Differential/Platelet   Comprehensive metabolic panel   Bayer DCA Hb A1c Waived (Completed)   Microalbumin, Urine Waived (Completed)   Hyperlipidemia associated with type 2 diabetes mellitus ([place])    Unable to tolerate statins. Rechecking labs today. Await results. Treat as needed.       Relevant Medications   losartan (COZAAR) 25 MG tablet   tirzepatide (MOUNJARO) 10 MG/0.5ML Pen   Other Relevant Orders   CBC with Differential/Platelet   Comprehensive metabolic panel   Lipid Panel w/o Chol/HDL Ratio     Musculoskeletal and Integument   Statin myopathy    Unable to tolerate statins. Rechecking labs today. Await results. Treat as needed.         Other   Anxiety    Under good control on current regimen. Continue current  regimen. Continue to monitor. Call with any concerns. Refills given.        Relevant Medications   buPROPion (WELLBUTRIN SR) 150 MG 12 hr tablet   Other Relevant Orders   CBC with Differential/Platelet   Comprehensive metabolic panel   Vitamin D deficiency    Rechecking labs today. Await results.      Relevant Orders   CBC with Differential/Platelet   Comprehensive metabolic panel   VITAMIN D 25 Hydroxy (Vit-D Deficiency, Fractures)   Absence of bladder continence    Continue to follow with urology. Call with any concerns. UA checked today.      Relevant Medications   Vibegron (GEMTESA) 75 MG TABS   Other Relevant Orders   CBC with Differential/Platelet   Comprehensive metabolic panel   Urinalysis, Routine w reflex microscopic (Completed)   Recurrent major depressive disorder, in full remission ([place])    Under good control on current regimen. Continue current regimen. Continue to monitor. Call with any concerns. Refills given.        Relevant Medications   buPROPion (WELLBUTRIN SR) 150 MG 12 hr tablet   Other Visit Diagnoses     Routine general medical examination at a health care facility    -  Primary   Vaccines up to date/declined. Screening labs checked today. Mammo ordered. Colonoscopy and DEXA up to date. Continue diet and exercise. Call with any concerns.   Encounter for screening mammogram for malignant neoplasm of breast       Mammo ordered today. Await results.   Relevant Orders   MM 3D SCREEN BREAST BILATERAL        Follow up plan: Return in about 6 months (around 10/17/2022).   LABORATORY TESTING:  - Pap smear: not applicable  IMMUNIZATIONS:   - Tdap: Tetanus vaccination status reviewed: last tetanus booster within 10 years. - Influenza: Refused - Pneumovax: Up to date - Prevnar: Up to date - COVID: Given elsewhere - HPV: Not applicable - Shingrix vaccine: Ordered today  SCREENING: -Mammogram: Ordered today  - Colonoscopy: Up to date  - Bone  Density: Up to date   PATIENT COUNSELING:   Advised to take 1 mg of folate supplement per day if capable of pregnancy.   Sexuality: Discussed sexually transmitted diseases, partner selection, use of condoms, avoidance of unintended pregnancy  and contraceptive alternatives.   Advised to avoid cigarette smoking.  I discussed with the patient that most people either abstain from alcohol or drink within safe limits (<=14/week and <=4 drinks/occasion for males, <=7/weeks and <= 3 drinks/occasion for females) and that the risk for alcohol disorders and other health effects rises proportionally with the number of drinks per week and how often a drinker exceeds daily limits.  Discussed cessation/primary prevention of drug use and availability of treatment for abuse.   Diet: Encouraged to adjust caloric intake to maintain  or achieve ideal body weight, to reduce intake of dietary saturated fat and total fat, to limit sodium intake by avoiding high sodium foods and not adding table salt, and to maintain adequate dietary potassium and calcium preferably from fresh fruits, vegetables, and low-fat dairy products.    stressed the importance of regular exercise  Injury prevention: Discussed safety belts, safety helmets, smoke detector, smoking near bedding or upholstery.   Dental health: Discussed importance of regular tooth brushing, flossing, and dental visits.    NEXT PREVENTATIVE PHYSICAL DUE IN 1 YEAR. Return in about 6 months (around 10/17/2022).

## 2022-04-17 NOTE — Assessment & Plan Note (Signed)
Doing well with A1c of 5.5. Will change from ozempic to mounjaro due to shortage. Call with any concerns. Follow up 6 months. Continue to monitor.

## 2022-04-17 NOTE — Patient Instructions (Signed)
Please call to schedule your mammogram and/or bone density: [facility]  Address: [address], [place], [place] [address] Phone: [phone]  [facility] [place] [address] [place],  [place]  [address] Phone: [phone]   

## 2022-04-17 NOTE — Assessment & Plan Note (Signed)
Under good control on current regimen. Continue current regimen. Continue to monitor. Call with any concerns. Refills given. Labs drawn today.   

## 2022-04-17 NOTE — Assessment & Plan Note (Signed)
Rechecking labs today. Await results.  

## 2022-04-17 NOTE — Assessment & Plan Note (Signed)
Continue to follow with urology. Call with any concerns. UA checked today.

## 2022-04-18 ENCOUNTER — Ambulatory Visit: Payer: 59

## 2022-04-18 LAB — CBC WITH DIFFERENTIAL/PLATELET
Basophils Absolute: 0 10*3/uL (ref 0.0–0.2)
Basos: 1 %
EOS (ABSOLUTE): 0.3 10*3/uL (ref 0.0–0.4)
Eos: 5 %
Hematocrit: 40.2 % (ref 34.0–46.6)
Hemoglobin: 13.2 g/dL (ref 11.1–15.9)
Immature Grans (Abs): 0 10*3/uL (ref 0.0–0.1)
Immature Granulocytes: 0 %
Lymphocytes Absolute: 1.7 10*3/uL (ref 0.7–3.1)
Lymphs: 35 %
MCH: 30.2 pg (ref 26.6–33.0)
MCHC: 32.8 g/dL (ref 31.5–35.7)
MCV: 92 fL (ref 79–97)
Monocytes Absolute: 0.4 10*3/uL (ref 0.1–0.9)
Monocytes: 8 %
Neutrophils Absolute: 2.5 10*3/uL (ref 1.4–7.0)
Neutrophils: 51 %
Platelets: 275 10*3/uL (ref 150–450)
RBC: 4.37 x10E6/uL (ref 3.77–5.28)
RDW: 12 % (ref 11.7–15.4)
WBC: 4.8 10*3/uL (ref 3.4–10.8)

## 2022-04-18 LAB — COMPREHENSIVE METABOLIC PANEL
ALT: 15 IU/L (ref 0–32)
AST: 13 IU/L (ref 0–40)
Albumin/Globulin Ratio: 2.1 (ref 1.2–2.2)
Albumin: 4.4 g/dL (ref 3.9–4.9)
Alkaline Phosphatase: 82 IU/L (ref 44–121)
BUN/Creatinine Ratio: 18 (ref 12–28)
BUN: 14 mg/dL (ref 8–27)
Bilirubin Total: 0.3 mg/dL (ref 0.0–1.2)
CO2: 22 mmol/L (ref 20–29)
Calcium: 9.4 mg/dL (ref 8.7–10.3)
Chloride: 102 mmol/L (ref 96–106)
Creatinine, Ser: 0.77 mg/dL (ref 0.57–1.00)
Globulin, Total: 2.1 g/dL (ref 1.5–4.5)
Glucose: 116 mg/dL — ABNORMAL HIGH (ref 70–99)
Potassium: 4 mmol/L (ref 3.5–5.2)
Sodium: 140 mmol/L (ref 134–144)
Total Protein: 6.5 g/dL (ref 6.0–8.5)
eGFR: 84 mL/min/{1.73_m2} (ref >59)

## 2022-04-18 LAB — LIPID PANEL W/O CHOL/HDL RATIO
Cholesterol, Total: 218 mg/dL — ABNORMAL HIGH (ref 100–199)
HDL: 83 mg/dL (ref >39)
LDL Chol Calc (NIH): 123 mg/dL — ABNORMAL HIGH (ref 0–99)
Triglycerides: 66 mg/dL (ref 0–149)
VLDL Cholesterol Cal: 12 mg/dL (ref 5–40)

## 2022-04-18 LAB — VITAMIN D 25 HYDROXY (VIT D DEFICIENCY, FRACTURES): Vit D, 25-Hydroxy: 36.9 ng/mL (ref 30.0–100.0)

## 2022-04-18 LAB — TSH: TSH: 2.44 u[IU]/mL (ref 0.450–4.500)

## 2022-05-08 ENCOUNTER — Ambulatory Visit: Payer: 59 | Admitting: Nurse Practitioner

## 2022-05-08 ENCOUNTER — Encounter: Payer: Self-pay | Admitting: Nurse Practitioner

## 2022-05-08 ENCOUNTER — Telehealth: Payer: Self-pay | Admitting: Family Medicine

## 2022-05-08 ENCOUNTER — Ambulatory Visit
Admission: RE | Admit: 2022-05-08 | Discharge: 2022-05-08 | Disposition: A | Discharge status: Home / Self Care | Payer: 59 | Attending: Nurse Practitioner | Admitting: Nurse Practitioner

## 2022-05-08 ENCOUNTER — Ambulatory Visit: Payer: Self-pay

## 2022-05-08 ENCOUNTER — Ambulatory Visit
Admission: RE | Admit: 2022-05-08 | Discharge: 2022-05-08 | Disposition: A | Discharge status: Home / Self Care | Payer: 59 | Source: Ambulatory Visit | Attending: Nurse Practitioner | Admitting: Nurse Practitioner

## 2022-05-08 VITALS — BP 137/77 | HR 85 | Temp 98.0°F | Wt 177.2 lb

## 2022-05-08 DIAGNOSIS — R0981 Nasal congestion: Secondary | ICD-10-CM | POA: Diagnosis not present

## 2022-05-08 DIAGNOSIS — R509 Fever, unspecified: Secondary | ICD-10-CM

## 2022-05-08 DIAGNOSIS — J0110 Acute frontal sinusitis, unspecified: Secondary | ICD-10-CM | POA: Diagnosis not present

## 2022-05-08 LAB — VERITOR FLU A/B WAIVED
Influenza A: NEGATIVE
Influenza B: NEGATIVE

## 2022-05-08 MED ORDER — HYDROCOD POLI-CHLORPHE POLI ER 10-8 MG/5ML PO SUER: 5.0000 mL | Freq: Every evening | ORAL | 0 refills | Status: DC | PRN

## 2022-05-08 MED ORDER — HYDROCODONE BIT-HOMATROP MBR 5-1.5 MG/5ML PO SOLN: 5.0000 mL | Freq: Every evening | ORAL | 0 refills | Status: DC | PRN

## 2022-05-08 MED ORDER — DOXYCYCLINE HYCLATE 100 MG PO TABS: 100.0000 mg | ORAL_TABLET | Freq: Two times a day (BID) | ORAL | 0 refills | Status: DC

## 2022-05-08 NOTE — Progress Notes (Signed)
Results discussed with patient during visit.

## 2022-05-08 NOTE — Telephone Encounter (Signed)
  Chief Complaint: fever, wheezing Symptoms: dry cough, sneezing, body aches, chills, mild SOB Frequency: yesterday Pertinent Negatives: Patient denies chest pain, vomiting sore throat Disposition: [] ED /[] Urgent Care (no appt availability in office) / [x] Appointment(In office/virtual)/ []  [facility]/ [] Home Care/ [] Refused Recommended Disposition /[] [facility]/ []  Follow-up with PCP Additional Notes: am appt scheduled today.Advised to increase fluid intake to 6-8 glasses of water, humidifier, saline nasal spray, drinking warm fluids.  Reason for Disposition  [1] Fever > 101 F (38.3 C) AND [2] age > 60 years  Answer Assessment - Initial Assessment Questions 1. TEMPERATURE: "What is the most recent temperature?"  "How was it measured?"      Digital 0530 102 down to 101 2. ONSET: "When did the fever start?"      yesterday 3. CHILLS: "Do you have chills?" If yes: "How bad are they?"  (e.g., none, mild, moderate, severe)   - NONE: no chills   - MILD: feeling cold   - MODERATE: feeling very cold, some shivering (feels better under a thick blanket)   - SEVERE: feeling extremely cold with shaking chills (general body shaking, rigors; even under a thick blanket)      moderate 4. OTHER SYMPTOMS: "Do you have any other symptoms besides the fever?"  (e.g., abdomen pain, cough, diarrhea, earache, headache, sore throat, urination pain)     Cough, sneezing, body aches, nose blocked.  5. CAUSE: If there are no symptoms, ask: "What do you think is causing the fever?"      no 6. CONTACTS: "Does anyone else in the family have an infection?"     no 7. TREATMENT: "What have you done so far to treat this fever?" (e.g., medications)     tylenol 8. IMMUNOCOMPROMISE: "Do you have of the following: diabetes, HIV positive, splenectomy, cancer chemotherapy, chronic steroid treatment, transplant patient, etc."     no 9. PREGNANCY: "Is there any chance you are pregnant?" "When was your  last menstrual period?"     N/a 10. TRAVEL: "Have you traveled out of the country in the last month?" (e.g., travel history, exposures)       no  Protocols used: Fever-A-AH

## 2022-05-08 NOTE — Telephone Encounter (Signed)
[name] from [facility] called in states the [facility]  is alos on backorder. She says they do have the tablets of HYDROcodone (TUSSIONEX  and promethzine codene

## 2022-05-08 NOTE — Telephone Encounter (Signed)
Medication changed due to backorder of tussinex

## 2022-05-08 NOTE — Progress Notes (Signed)
BP 137/77   Pulse 85   Temp 98 F (36.7 C) (Oral)   Wt 177 lb 3.2 oz (80.4 kg)   SpO2 98%   BMI 36.72 kg/m    Subjective:    Patient ID: [name], female    DOB: [birth year], 67 y.o.   MRN: [id number]  HPI: [name] is a 67 y.o. female  Chief Complaint  Patient presents with   URI    Pt states she started having congestion, cough, fever, and body aches yesterday.    UPPER RESPIRATORY TRACT INFECTION Worst symptom: symptoms started yesterday Fever:  102 at midnight Cough: yes Shortness of breath: yes Wheezing: yes Chest pain: yes Chest tightness: yes Chest congestion: yes Nasal congestion: yes Runny nose: yes Post nasal drip: yes Sneezing: yes Sore throat: yes Swollen glands: yes Sinus pressure: yes Headache: yes Face pain: yes Toothache: no Ear pain: yes "right Ear pressure: yes bilateral Eyes red/itching:yes Eye drainage/crusting: no  Vomiting: no Rash: no Fatigue: yes Sick contacts: no Strep contacts: no  Context: worse Recurrent sinusitis: no Relief with OTC cold/cough medications: no  Treatments attempted: cough syrup   Relevant past medical, surgical, family and social history reviewed and updated as indicated. Interim medical history since our last visit reviewed. Allergies and medications reviewed and updated.  Review of Systems  Constitutional:  Positive for fatigue and fever.  HENT:  Positive for congestion, ear pain, postnasal drip, rhinorrhea, sinus pressure, sinus pain, sneezing and sore throat. Negative for dental problem.   Respiratory:  Positive for cough, chest tightness, shortness of breath and wheezing.   Cardiovascular:  Positive for chest pain.  Gastrointestinal:  Negative for vomiting.  Skin:  Negative for rash.  Neurological:  Positive for headaches.    Per HPI unless specifically indicated above     Objective:    BP 137/77   Pulse 85   Temp 98 F (36.7 C) (Oral)   Wt 177 lb 3.2 oz (80.4 kg)   SpO2  98%   BMI 36.72 kg/m   Wt Readings from Last 3 Encounters:  05/08/22 177 lb 3.2 oz (80.4 kg)  04/17/22 175 lb 11.2 oz (79.7 kg)  10/16/21 170 lb 6.4 oz (77.3 kg)    Physical Exam Vitals and nursing note reviewed.  Constitutional:      General: She is not in acute distress.    Appearance: Normal appearance. She is normal weight. She is not ill-appearing, toxic-appearing or diaphoretic.  HENT:     Head: Normocephalic.     Right Ear: External ear normal.     Left Ear: External ear normal.     Nose: Congestion and rhinorrhea present.     Right Sinus: Maxillary sinus tenderness and frontal sinus tenderness present.     Left Sinus: Maxillary sinus tenderness and frontal sinus tenderness present.     Mouth/Throat:     Mouth: Mucous membranes are moist.     Pharynx: Oropharynx is clear. Posterior oropharyngeal erythema present. No oropharyngeal exudate.  Eyes:     General:        Right eye: No discharge.        Left eye: No discharge.     Extraocular Movements: Extraocular movements intact.     Conjunctiva/sclera: Conjunctivae normal.     Pupils: Pupils are equal, round, and reactive to light.  Cardiovascular:     Rate and Rhythm: Normal rate and regular rhythm.     Heart sounds: No murmur heard. Pulmonary:  Effort: Pulmonary effort is normal. No respiratory distress.     Breath sounds: Wheezing present. No rales.  Musculoskeletal:     Cervical back: Normal range of motion and neck supple.  Lymphadenopathy:     Cervical:     Right cervical: No superficial cervical adenopathy.    Left cervical: No superficial cervical adenopathy.  Skin:    General: Skin is warm and dry.     Capillary Refill: Capillary refill takes less than 2 seconds.  Neurological:     General: No focal deficit present.     Mental Status: She is alert and oriented to person, place, and time. Mental status is at baseline.  Psychiatric:        Mood and Affect: Mood normal.        Behavior: Behavior normal.         Thought Content: Thought content normal.        Judgment: Judgment normal.     Results for orders placed or performed in visit on 04/17/22  CBC with Differential/Platelet  Result Value Ref Range   WBC 4.8 3.4 - 10.8 x10E3/uL   RBC 4.37 3.77 - 5.28 x10E6/uL   Hemoglobin 13.2 11.1 - 15.9 g/dL   Hematocrit 36.6 44.0 - 46.6 %   MCV 92 79 - 97 fL   MCH 30.2 26.6 - 33.0 pg   MCHC 32.8 31.5 - 35.7 g/dL   RDW 34.7 42.5 - 95.6 %   Platelets 275 150 - 450 x10E3/uL   Neutrophils 51 Not Estab. %   Lymphs 35 Not Estab. %   Monocytes 8 Not Estab. %   Eos 5 Not Estab. %   Basos 1 Not Estab. %   Neutrophils Absolute 2.5 1.4 - 7.0 x10E3/uL   Lymphocytes Absolute 1.7 0.7 - 3.1 x10E3/uL   Monocytes Absolute 0.4 0.1 - 0.9 x10E3/uL   EOS (ABSOLUTE) 0.3 0.0 - 0.4 x10E3/uL   Basophils Absolute 0.0 0.0 - 0.2 x10E3/uL   Immature Granulocytes 0 Not Estab. %   Immature Grans (Abs) 0.0 0.0 - 0.1 x10E3/uL  Comprehensive metabolic panel  Result Value Ref Range   Glucose 116 (H) 70 - 99 mg/dL   BUN 14 8 - 27 mg/dL   Creatinine, Ser 3.87 0.57 - 1.00 mg/dL   eGFR 84 >56 EP/PIR/5.18   BUN/Creatinine Ratio 18 12 - 28   Sodium 140 134 - 144 mmol/L   Potassium 4.0 3.5 - 5.2 mmol/L   Chloride 102 96 - 106 mmol/L   CO2 22 20 - 29 mmol/L   Calcium 9.4 8.7 - 10.3 mg/dL   Total Protein 6.5 6.0 - 8.5 g/dL   Albumin 4.4 3.9 - 4.9 g/dL   Globulin, Total 2.1 1.5 - 4.5 g/dL   Albumin/Globulin Ratio 2.1 1.2 - 2.2   Bilirubin Total 0.3 0.0 - 1.2 mg/dL   Alkaline Phosphatase 82 44 - 121 IU/L   AST 13 0 - 40 IU/L   ALT 15 0 - 32 IU/L  Lipid Panel w/o Chol/HDL Ratio  Result Value Ref Range   Cholesterol, Total 218 (H) 100 - 199 mg/dL   Triglycerides 66 0 - 149 mg/dL   HDL 83 >84 mg/dL   VLDL Cholesterol Cal 12 5 - 40 mg/dL   LDL Chol Calc (NIH) 166 (H) 0 - 99 mg/dL  Urinalysis, Routine w reflex microscopic  Result Value Ref Range   Specific Gravity, UA 1.020 1.005 - 1.030   pH, UA 7.0 5.0 - 7.5  Color, UA Yellow Yellow   Appearance Ur Clear Clear   Leukocytes,UA Negative Negative   Protein,UA Negative Negative/Trace   Glucose, UA Negative Negative   Ketones, UA Negative Negative   RBC, UA Negative Negative   Bilirubin, UA Negative Negative   Urobilinogen, Ur 1.0 0.2 - 1.0 mg/dL   Nitrite, UA Negative Negative   Microscopic Examination Comment   TSH  Result Value Ref Range   TSH 2.440 0.450 - 4.500 uIU/mL  Bayer DCA Hb A1c Waived  Result Value Ref Range   HB A1C (BAYER DCA - WAIVED) 5.5 4.8 - 5.6 %  Microalbumin, Urine Waived  Result Value Ref Range   Microalb, Ur Waived 10 0 - 19 mg/L   Creatinine, Urine Waived 100 10 - 300 mg/dL   Microalb/Creat Ratio <30 <30 mg/g  VITAMIN D 25 Hydroxy (Vit-D Deficiency, Fractures)  Result Value Ref Range   Vit D, 25-Hydroxy 36.9 30.0 - 100.0 ng/mL      Assessment & Plan:   Problem List Items Addressed This Visit   None Visit Diagnoses     Acute non-recurrent frontal sinusitis    -  Primary   Will treat with doxycyline. Will obtain chest xray due to sob, wheezing and chest tightness. If needed can treat with prednisone. Cough medicine sent.   Relevant Medications   doxycycline (VIBRA-TABS) 100 MG tablet   chlorpheniramine-HYDROcodone (TUSSIONEX) 10-8 MG/5ML   Congestion of nasal sinus       Relevant Orders   Novel Coronavirus, NAA ([facility])   Veritor Flu A/B Waived   Fever, unspecified fever cause       Relevant Orders   Novel Coronavirus, NAA ([facility])   Veritor Flu A/B Waived   DG Chest 2 View        Follow up plan: Return if symptoms worsen or fail to improve.

## 2022-05-08 NOTE — Telephone Encounter (Signed)
[facility] called and chlorpheniramine-HYDROcodone ([place]) 10-8 MG/5ML is on backorder and they can not get this/ they also can not get Hycodan as well / they asked for an alternative to be sent / please advise

## 2022-05-09 ENCOUNTER — Telehealth: Payer: Self-pay | Admitting: Family Medicine

## 2022-05-09 LAB — NOVEL CORONAVIRUS, NAA: SARS-CoV-2, NAA: NOT DETECTED

## 2022-05-09 MED ORDER — PREDNISONE 10 MG PO TABS: 10.0000 mg | ORAL_TABLET | Freq: Every day | ORAL | 0 refills | Status: DC

## 2022-05-09 MED ORDER — HYDROCODONE BIT-HOMATROP MBR 5-1.5 MG/5ML PO SOLN: 5.0000 mL | Freq: Every evening | ORAL | 0 refills | Status: DC | PRN

## 2022-05-09 NOTE — Telephone Encounter (Signed)
Patient is calling to report that she is in of the HYDROcodone bit-homatropine (HYCODAN) 5-1.5 MG/5ML syrup [[id number]]  & predniSONE (DELTASONE) 10 MG tablet [[id number]] to be resent to the pharmacy. The pharmacy has not received the medication [facility] - [place], [place] - [address] [place] [address] [place], [place] [address] Phone: [phone]  Fax: [phone]

## 2022-05-09 NOTE — Addendum Note (Signed)
Addended by: Jon Billings on: 05/09/2022 09:15 AM   Modules accepted: Orders

## 2022-05-09 NOTE — Progress Notes (Signed)
Please let patient know that her chest xray showed inflammation in her airways.  I have sent her a 12 day course of prednisone to the pharmacy.  Please let me know if she has any questions.

## 2022-05-09 NOTE — Telephone Encounter (Signed)
Medication sent to [facility].

## 2022-05-09 NOTE — Addendum Note (Signed)
Addended by: Jon Billings on: 05/09/2022 04:10 PM   Modules accepted: Orders

## 2022-05-09 NOTE — Telephone Encounter (Signed)
[facility] [facility] is out of [facility] but they do have [employer]

## 2022-05-09 NOTE — Telephone Encounter (Signed)
See other phone encounter.  

## 2022-05-09 NOTE — Telephone Encounter (Signed)
Can we see if there is a different pharmacy medication can be sent to?

## 2022-05-09 NOTE — Telephone Encounter (Signed)
Called and notified patient about medication being called in to [facility].

## 2022-05-10 MED ORDER — HYDROCODONE BIT-HOMATROP MBR 5-1.5 MG/5ML PO SOLN: 5.0000 mL | Freq: Every evening | ORAL | 0 refills | Status: DC | PRN

## 2022-05-10 NOTE — Progress Notes (Signed)
Please let patient know her COVID test was negative.

## 2022-06-03 LAB — HM DIABETES EYE EXAM

## 2022-06-06 ENCOUNTER — Encounter: Payer: Self-pay | Admitting: Family Medicine

## 2022-06-06 NOTE — Telephone Encounter (Signed)
Needs appt

## 2022-06-07 NOTE — Telephone Encounter (Signed)
Patient contacted and appointment scheduled.

## 2022-06-17 ENCOUNTER — Encounter: Payer: Self-pay | Admitting: Family Medicine

## 2022-06-17 ENCOUNTER — Ambulatory Visit: Payer: 59 | Admitting: Family Medicine

## 2022-06-17 VITALS — BP 131/81 | HR 82 | Temp 98.0°F | Ht 58.25 in | Wt 181.1 lb

## 2022-06-17 DIAGNOSIS — E1169 Type 2 diabetes mellitus with other specified complication: Secondary | ICD-10-CM

## 2022-06-17 LAB — BAYER DCA HB A1C WAIVED: HB A1C (BAYER DCA - WAIVED): 5.3 % (ref 4.8–5.6)

## 2022-06-17 NOTE — Progress Notes (Signed)
BP 131/81   Pulse 82   Temp 98 F (36.7 C) (Oral)   Ht 4' 10.25" (1.48 m)   Wt 181 lb 1.6 oz (82.1 kg)   SpO2 99%   BMI 37.53 kg/m    Subjective:    Patient ID: [name], female    DOB: 11-30-1955, 67 y.o.   MRN: [id number]  HPI: [name] is a 67 y.o. female  Chief Complaint  Patient presents with   Diabetes   DIABETES Hypoglycemic episodes:no Polydipsia/polyuria: no Visual disturbance: no Chest pain: no Paresthesias: no Glucose Monitoring: no  Accucheck frequency: Not Checking Taking Insulin?: no Blood Pressure Monitoring: not checking Retinal Examination: Up to Date Foot Exam: Up to Date Diabetic Education: Completed Pneumovax: Up to Date Influenza: Up to Date Aspirin: no  Relevant past medical, surgical, family and social history reviewed and updated as indicated. Interim medical history since our last visit reviewed. Allergies and medications reviewed and updated.  Review of Systems  Constitutional: Negative.   Respiratory: Negative.    Cardiovascular: Negative.   Gastrointestinal: Negative.   Musculoskeletal: Negative.   Psychiatric/Behavioral: Negative.      Per HPI unless specifically indicated above     Objective:    BP 131/81   Pulse 82   Temp 98 F (36.7 C) (Oral)   Ht 4' 10.25" (1.48 m)   Wt 181 lb 1.6 oz (82.1 kg)   SpO2 99%   BMI 37.53 kg/m   Wt Readings from Last 3 Encounters:  06/17/22 181 lb 1.6 oz (82.1 kg)  05/08/22 177 lb 3.2 oz (80.4 kg)  04/17/22 175 lb 11.2 oz (79.7 kg)    Physical Exam Vitals and nursing note reviewed.  Constitutional:      General: She is not in acute distress.    Appearance: Normal appearance. She is obese. She is not ill-appearing, toxic-appearing or diaphoretic.  HENT:     Head: Normocephalic and atraumatic.     Right Ear: External ear normal.     Left Ear: External ear normal.     Nose: Nose normal.     Mouth/Throat:     Mouth: Mucous membranes are moist.     Pharynx:  Oropharynx is clear.  Eyes:     General: No scleral icterus.       Right eye: No discharge.        Left eye: No discharge.     Extraocular Movements: Extraocular movements intact.     Conjunctiva/sclera: Conjunctivae normal.     Pupils: Pupils are equal, round, and reactive to light.  Cardiovascular:     Rate and Rhythm: Normal rate and regular rhythm.     Pulses: Normal pulses.     Heart sounds: Normal heart sounds. No murmur heard.    No friction rub. No gallop.  Pulmonary:     Effort: Pulmonary effort is normal. No respiratory distress.     Breath sounds: Normal breath sounds. No stridor. No wheezing, rhonchi or rales.  Chest:     Chest wall: No tenderness.  Musculoskeletal:        General: Normal range of motion.     Cervical back: Normal range of motion and neck supple.  Skin:    General: Skin is warm and dry.     Capillary Refill: Capillary refill takes less than 2 seconds.     Coloration: Skin is not jaundiced or pale.     Findings: No bruising, erythema, lesion or rash.  Neurological:  General: No focal deficit present.     Mental Status: She is alert and oriented to person, place, and time. Mental status is at baseline.  Psychiatric:        Mood and Affect: Mood normal.        Behavior: Behavior normal.        Thought Content: Thought content normal.        Judgment: Judgment normal.     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 Tolerating medicine well. Not losing weight as she would like.  Will continue working with trainer and nutritionist. Will continue mounjaro '10mg'$ . Consider increasing when national back order is better.       Relevant Orders   Bayer DCA Hb A1c Waived     Follow up plan: Return as scheduled.

## 2022-06-17 NOTE — Assessment & Plan Note (Signed)
A1c stable at 5.3. Tolerating medicine well. Not losing weight as she would like. Will continue working with trainer and nutritionist. Will continue mounjaro '10mg'$ . Consider increasing when national back order is better.

## 2022-06-20 ENCOUNTER — Telehealth: Payer: Self-pay

## 2022-06-20 NOTE — Telephone Encounter (Signed)
Per Dr [name], due to the nationwide shortage of [place]. Dr [name] says to let the patient know that she can stop by the office and grab a sample of 0.25/0.'5MG'$  of Ozempic. Patient was notified and verbalized understanding and says she may send her son to pick up due to surgery recently.

## 2022-06-20 NOTE — Telephone Encounter (Signed)
The patient would like to speak with a member of clinical staff to confirm what medication will be ordered for them and when the samples/order will be available for pick up   Please contact further when possible

## 2022-06-20 NOTE — Telephone Encounter (Signed)
Can you please check to see if [facility] [address] [facility] has any of the 10?

## 2022-06-25 NOTE — Telephone Encounter (Signed)
Patient was notified and updated via [place].

## 2022-07-02 MED ORDER — SEMAGLUTIDE (2 MG/DOSE) 8 MG/3ML ~~LOC~~ SOPN: 2.0000 mg | PEN_INJECTOR | SUBCUTANEOUS | 3 refills | Status: DC

## 2022-08-30 ENCOUNTER — Encounter: Payer: Self-pay | Admitting: Family Medicine

## 2022-09-02 MED ORDER — TIRZEPATIDE 10 MG/0.5ML ~~LOC~~ SOAJ: 10.0000 mg | SUBCUTANEOUS | 1 refills | Status: DC

## 2022-10-02 ENCOUNTER — Ambulatory Visit
Admission: RE | Admit: 2022-10-02 | Discharge: 2022-10-02 | Disposition: A | Discharge status: Home / Self Care | Payer: 59 | Source: Ambulatory Visit | Attending: Family Medicine | Admitting: Family Medicine

## 2022-10-02 DIAGNOSIS — Z1231 Encounter for screening mammogram for malignant neoplasm of breast: Secondary | ICD-10-CM

## 2022-10-17 ENCOUNTER — Encounter: Payer: Self-pay | Admitting: Family Medicine

## 2022-10-17 ENCOUNTER — Ambulatory Visit (INDEPENDENT_AMBULATORY_CARE_PROVIDER_SITE_OTHER): Payer: 59 | Admitting: Family Medicine

## 2022-10-17 VITALS — BP 136/73 | HR 58 | Temp 98.2°F | Wt 177.4 lb

## 2022-10-17 DIAGNOSIS — I10 Essential (primary) hypertension: Secondary | ICD-10-CM | POA: Diagnosis not present

## 2022-10-17 DIAGNOSIS — E1169 Type 2 diabetes mellitus with other specified complication: Secondary | ICD-10-CM | POA: Diagnosis not present

## 2022-10-17 DIAGNOSIS — E782 Mixed hyperlipidemia: Secondary | ICD-10-CM

## 2022-10-17 DIAGNOSIS — I25119 Atherosclerotic heart disease of native coronary artery with unspecified angina pectoris: Secondary | ICD-10-CM

## 2022-10-17 DIAGNOSIS — F3342 Major depressive disorder, recurrent, in full remission: Secondary | ICD-10-CM | POA: Diagnosis not present

## 2022-10-17 DIAGNOSIS — Z7984 Long term (current) use of oral hypoglycemic drugs: Secondary | ICD-10-CM

## 2022-10-17 DIAGNOSIS — G720 Drug-induced myopathy: Secondary | ICD-10-CM

## 2022-10-17 DIAGNOSIS — T466X5A Adverse effect of antihyperlipidemic and antiarteriosclerotic drugs, initial encounter: Secondary | ICD-10-CM

## 2022-10-17 DIAGNOSIS — E559 Vitamin D deficiency, unspecified: Secondary | ICD-10-CM

## 2022-10-17 DIAGNOSIS — E785 Hyperlipidemia, unspecified: Secondary | ICD-10-CM

## 2022-10-17 DIAGNOSIS — J449 Chronic obstructive pulmonary disease, unspecified: Secondary | ICD-10-CM

## 2022-10-17 LAB — BAYER DCA HB A1C WAIVED: HB A1C (BAYER DCA - WAIVED): 5.6 % (ref 4.8–5.6)

## 2022-10-17 MED ORDER — TIRZEPATIDE 12.5 MG/0.5ML ~~LOC~~ SOAJ: 12.5000 mg | SUBCUTANEOUS | 1 refills | Status: DC

## 2022-10-17 MED ORDER — BREZTRI AEROSPHERE 160-9-4.8 MCG/ACT IN AERO: 2.0000 | INHALATION_SPRAY | Freq: Two times a day (BID) | RESPIRATORY_TRACT | 11 refills | Status: DC

## 2022-10-17 MED ORDER — ALBUTEROL SULFATE HFA 108 (90 BASE) MCG/ACT IN AERS: 2.0000 | INHALATION_SPRAY | Freq: Four times a day (QID) | RESPIRATORY_TRACT | 6 refills | Status: DC | PRN

## 2022-10-17 MED ORDER — HYOSCYAMINE SULFATE 0.125 MG SL SUBL: SUBLINGUAL_TABLET | SUBLINGUAL | 1 refills | Status: DC

## 2022-10-17 MED ORDER — BUPROPION HCL ER (SR) 150 MG PO TB12: 150.0000 mg | ORAL_TABLET | Freq: Three times a day (TID) | ORAL | 1 refills | Status: DC

## 2022-10-17 MED ORDER — ALBUTEROL SULFATE (2.5 MG/3ML) 0.083% IN NEBU: INHALATION_SOLUTION | RESPIRATORY_TRACT | 1 refills | Status: AC

## 2022-10-17 MED ORDER — LOSARTAN POTASSIUM 25 MG PO TABS: 12.5000 mg | ORAL_TABLET | Freq: Every day | ORAL | 1 refills | Status: DC

## 2022-10-17 MED ORDER — MONTELUKAST SODIUM 10 MG PO TABS: 10.0000 mg | ORAL_TABLET | Freq: Every day | ORAL | 1 refills | Status: DC

## 2022-10-17 NOTE — Assessment & Plan Note (Signed)
Doing well with A1c of 5.6. Would like to go up on her mounjaro to help with additional weight loss. Rx sent to her pharmacy. Call with any concerns.

## 2022-10-17 NOTE — Assessment & Plan Note (Signed)
Under good control on current regimen. Continue current regimen. Continue to monitor. Call with any concerns. Refills given. Labs drawn today.   

## 2022-10-17 NOTE — Assessment & Plan Note (Signed)
Rechecking labs today. Await results. Treat as needed.  °

## 2022-10-17 NOTE — Assessment & Plan Note (Signed)
Under good control on current regimen. Continue current regimen. Continue to monitor. Call with any concerns. Refills given.   

## 2022-10-17 NOTE — Assessment & Plan Note (Signed)
Unable to tolerate statins. Continue to monitor. Call with any concerns.  

## 2022-10-17 NOTE — Assessment & Plan Note (Signed)
Will keep BP and cholesterol under good control. Continue to monitor. Call with any concerns.  

## 2022-10-17 NOTE — Progress Notes (Signed)
BP 136/73   Pulse (!) 58   Temp 98.2 F (36.8 C) (Oral)   Wt 177 lb 6.4 oz (80.5 kg)   SpO2 95%   BMI 36.76 kg/m    Subjective:    Patient ID: [name], female    DOB: 11/11/1955, 67 y.o.   MRN: [id number]  HPI: [name] is a 67 y.o. female  Chief Complaint  Patient presents with   Diabetes    Will request eye exam [facility]   Hyperlipidemia   Hypertension   DIABETES Hypoglycemic episodes:no Polydipsia/polyuria: no Visual disturbance: no Chest pain: no Paresthesias: no Glucose Monitoring: no  Accucheck frequency: Not Checking Taking Insulin?: no Blood Pressure Monitoring: not checking Retinal Examination: Up to Date Foot Exam: Up to Date Diabetic Education: Completed Pneumovax: Up to Date Influenza: Up to Date Aspirin: no  HYPERTENSION / HYPERLIPIDEMIA Satisfied with current treatment? yes Duration of hypertension: chronic BP monitoring frequency: not checking BP medication side effects: no Past BP meds: losartan Duration of hyperlipidemia: chronic Cholesterol medication side effects: yes Cholesterol supplements: none Past cholesterol medications: statins caused myalgias Medication compliance: excellent compliance Aspirin: no Recent stressors: no Recurrent headaches: no Visual changes: no Palpitations: no Dyspnea: no Chest pain: no Lower extremity edema: no Dizzy/lightheaded: no  DEPRESSION Mood status: controlled Satisfied with current treatment?: yes Symptom severity: mild  Duration of current treatment : chronic Side effects: no Medication compliance: excellent compliance Psychotherapy/counseling: no  Previous psychiatric medications: wellbutrin Depressed mood: no Anxious mood: no Anhedonia: no Significant weight loss or gain: no Insomnia: no  Fatigue: no Feelings of worthlessness or guilt: no Impaired concentration/indecisiveness: no Suicidal ideations: no Hopelessness: no Crying spells: no    10/17/2022     8:16 AM 06/17/2022    8:41 AM 04/17/2022    8:10 AM 10/04/2021    9:59 AM 06/13/2021    9:53 AM  Depression screen PHQ 2/9  Decreased Interest 0 0 0 0 0  Down, Depressed, Hopeless 0 0 0 0 0  PHQ - 2 Score 0 0 0 0 0  Altered sleeping 0 0 0 0 0  Tired, decreased energy 0 0 0 0 0  Change in appetite 0 0 0 0 0  Feeling bad or failure about yourself  0 0 0 0 0  Trouble concentrating 0 0 0 0 0  Moving slowly or fidgety/restless 0 0  0 0  Suicidal thoughts 0 0 0 0 0  PHQ-9 Score 0 0 0 0 0  Difficult doing work/chores Not difficult at all Not difficult at all Not difficult at all Not difficult at all Not difficult at all     Relevant past medical, surgical, family and social history reviewed and updated as indicated. Interim medical history since our last visit reviewed. Allergies and medications reviewed and updated.  Review of Systems  Constitutional: Negative.   Respiratory:  Positive for cough and wheezing. Negative for apnea, choking, chest tightness, shortness of breath and stridor.   Cardiovascular: Negative.   Gastrointestinal: Negative.   Musculoskeletal: Negative.   Neurological: Negative.   Psychiatric/Behavioral: Negative.      Per HPI unless specifically indicated above     Objective:    BP 136/73   Pulse (!) 58   Temp 98.2 F (36.8 C) (Oral)   Wt 177 lb 6.4 oz (80.5 kg)   SpO2 95%   BMI 36.76 kg/m   Wt Readings from Last 3 Encounters:  10/17/22 177 lb 6.4 oz (80.5 kg)  06/17/22 181 lb 1.6 oz (82.1 kg)  05/08/22 177 lb 3.2 oz (80.4 kg)    Physical Exam Vitals and nursing note reviewed.  Constitutional:      General: She is not in acute distress.    Appearance: Normal appearance. She is obese. She is not ill-appearing, toxic-appearing or diaphoretic.  HENT:     Head: Normocephalic and atraumatic.     Right Ear: External ear normal.     Left Ear: External ear normal.     Nose: Nose normal.     Mouth/Throat:     Mouth: Mucous membranes are moist.      Pharynx: Oropharynx is clear.  Eyes:     General: No scleral icterus.       Right eye: No discharge.        Left eye: No discharge.     Extraocular Movements: Extraocular movements intact.     Conjunctiva/sclera: Conjunctivae normal.     Pupils: Pupils are equal, round, and reactive to light.  Cardiovascular:     Rate and Rhythm: Normal rate and regular rhythm.     Pulses: Normal pulses.     Heart sounds: Normal heart sounds. No murmur heard.    No friction rub. No gallop.  Pulmonary:     Effort: Pulmonary effort is normal. No respiratory distress.     Breath sounds: Normal breath sounds. No stridor. No wheezing, rhonchi or rales.  Chest:     Chest wall: No tenderness.  Musculoskeletal:        General: Normal range of motion.     Cervical back: Normal range of motion and neck supple.  Skin:    General: Skin is warm and dry.     Capillary Refill: Capillary refill takes less than 2 seconds.     Coloration: Skin is not jaundiced or pale.     Findings: No bruising, erythema, lesion or rash.  Neurological:     General: No focal deficit present.     Mental Status: She is alert and oriented to person, place, and time. Mental status is at baseline.  Psychiatric:        Mood and Affect: Mood normal.        Behavior: Behavior normal.        Thought Content: Thought content normal.        Judgment: Judgment normal.     Results for orders placed or performed in visit on 06/17/22  Bayer DCA Hb A1c Waived  Result Value Ref Range   HB A1C (BAYER DCA - WAIVED) 5.3 4.8 - 5.6 %      Assessment & Plan:   Problem List Items Addressed This Visit       Cardiovascular and Mediastinum   Essential hypertension - Primary    Under good control on current regimen. Continue current regimen. Continue to monitor. Call with any concerns. Refills given. Labs drawn today.        Relevant Medications   losartan (COZAAR) 25 MG tablet   Other Relevant Orders   CBC with Differential/Platelet    Comprehensive metabolic panel   Coronary artery disease with angina pectoris, unspecified vessel or lesion type, unspecified whether native or transplanted heart (HCC)    Will keep BP and cholesterol under good control. Continue to monitor. Call with any concerns.       Relevant Medications   losartan (COZAAR) 25 MG tablet     Respiratory   COPD (chronic obstructive pulmonary disease) (HCC)    Under  good control on current regimen. Continue current regimen. Continue to monitor. Call with any concerns. Refills given. Labs drawn today.        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 Would like to go up on her mounjaro to help with additional weight loss. Rx sent to her pharmacy. Call with any concerns.       Relevant Medications   losartan (COZAAR) 25 MG tablet   tirzepatide (MOUNJARO) 12.5 MG/0.5ML Pen   Other Relevant Orders   Bayer DCA Hb A1c Waived   CBC with Differential/Platelet   Comprehensive metabolic panel   Hyperlipidemia associated with type 2 diabetes mellitus (HCC)    Under good control on current regimen. Continue current regimen. Continue to monitor. Call with any concerns. Refills given. Labs drawn today.       Relevant Medications   losartan (COZAAR) 25 MG tablet   tirzepatide (MOUNJARO) 12.5 MG/0.5ML Pen   Other Relevant Orders   CBC with Differential/Platelet   Comprehensive metabolic panel     Musculoskeletal and Integument   Statin myopathy    Unable to tolerate statins. Continue to monitor. Call with any concerns.         Other   Vitamin D deficiency    Rechecking labs today. Await results. Treat as needed.       Relevant Orders    CBC with Differential/Platelet   Comprehensive metabolic panel   VITAMIN D 25 Hydroxy (Vit-D Deficiency, Fractures)   Hyperlipidemia    Under good control on current regimen. Continue current regimen. Continue to monitor. Call with any concerns. Refills given. Labs drawn today.       Relevant Medications   losartan (COZAAR) 25 MG tablet   Other Relevant Orders   CBC with Differential/Platelet   Comprehensive metabolic panel   Lipid Panel w/o Chol/HDL Ratio   Recurrent major depressive disorder, in full remission (HCC)    Under good control on current regimen. Continue current regimen. Continue to monitor. Call with any concerns. Refills given.        Relevant Medications   buPROPion (WELLBUTRIN SR) 150 MG 12 hr tablet   Other Relevant Orders   CBC with Differential/Platelet   Comprehensive metabolic panel     Follow up plan: Return in about 6 months (around 04/18/2023) for physcial.

## 2022-10-18 LAB — LIPID PANEL W/O CHOL/HDL RATIO
Cholesterol, Total: 177 mg/dL (ref 100–199)
HDL: 70 mg/dL (ref >39)
LDL Chol Calc (NIH): 89 mg/dL (ref 0–99)
Triglycerides: 100 mg/dL (ref 0–149)
VLDL Cholesterol Cal: 18 mg/dL (ref 5–40)

## 2022-10-18 LAB — CBC WITH DIFFERENTIAL/PLATELET
Basophils Absolute: 0 10*3/uL (ref 0.0–0.2)
Basos: 1 %
EOS (ABSOLUTE): 0.3 10*3/uL (ref 0.0–0.4)
Eos: 6 %
Hematocrit: 38.6 % (ref 34.0–46.6)
Hemoglobin: 12.7 g/dL (ref 11.1–15.9)
Immature Grans (Abs): 0 10*3/uL (ref 0.0–0.1)
Immature Granulocytes: 0 %
Lymphocytes Absolute: 1.3 10*3/uL (ref 0.7–3.1)
Lymphs: 28 %
MCH: 31 pg (ref 26.6–33.0)
MCHC: 32.9 g/dL (ref 31.5–35.7)
MCV: 94 fL (ref 79–97)
Monocytes Absolute: 0.5 10*3/uL (ref 0.1–0.9)
Monocytes: 10 %
Neutrophils Absolute: 2.7 10*3/uL (ref 1.4–7.0)
Neutrophils: 55 %
Platelets: 212 10*3/uL (ref 150–450)
RBC: 4.1 x10E6/uL (ref 3.77–5.28)
RDW: 11.9 % (ref 11.7–15.4)
WBC: 4.8 10*3/uL (ref 3.4–10.8)

## 2022-10-18 LAB — COMPREHENSIVE METABOLIC PANEL
ALT: 14 IU/L (ref 0–32)
AST: 10 IU/L (ref 0–40)
Albumin: 4.3 g/dL (ref 3.9–4.9)
Alkaline Phosphatase: 79 IU/L (ref 44–121)
BUN/Creatinine Ratio: 30 — ABNORMAL HIGH (ref 12–28)
BUN: 24 mg/dL (ref 8–27)
Bilirubin Total: 0.3 mg/dL (ref 0.0–1.2)
CO2: 22 mmol/L (ref 20–29)
Calcium: 9.2 mg/dL (ref 8.7–10.3)
Chloride: 104 mmol/L (ref 96–106)
Creatinine, Ser: 0.79 mg/dL (ref 0.57–1.00)
Globulin, Total: 1.8 g/dL (ref 1.5–4.5)
Glucose: 100 mg/dL — ABNORMAL HIGH (ref 70–99)
Potassium: 4.3 mmol/L (ref 3.5–5.2)
Sodium: 141 mmol/L (ref 134–144)
Total Protein: 6.1 g/dL (ref 6.0–8.5)
eGFR: 82 mL/min/{1.73_m2} (ref >59)

## 2022-10-18 LAB — VITAMIN D 25 HYDROXY (VIT D DEFICIENCY, FRACTURES): Vit D, 25-Hydroxy: 46.7 ng/mL (ref 30.0–100.0)

## 2022-11-11 ENCOUNTER — Encounter: Payer: Self-pay | Admitting: Family Medicine

## 2022-11-11 MED ORDER — TIRZEPATIDE 12.5 MG/0.5ML ~~LOC~~ SOAJ: 12.5000 mg | SUBCUTANEOUS | 1 refills | Status: DC

## 2022-11-11 MED ORDER — TIRZEPATIDE 10 MG/0.5ML ~~LOC~~ SOAJ: 10.0000 mg | SUBCUTANEOUS | 3 refills | Status: DC

## 2022-11-11 NOTE — Addendum Note (Signed)
Addended by: Marjie Skiff on: 11/11/2022 04:47 PM   Modules accepted: Orders

## 2022-11-29 ENCOUNTER — Encounter: Payer: Self-pay | Admitting: Family Medicine

## 2022-12-04 ENCOUNTER — Encounter: Payer: Self-pay | Admitting: Family Medicine

## 2023-02-02 IMAGING — DX DG CHEST 2V
2 series · 2 of 2 positions shown · non-contrast
Comparison: 11/19/2019

CLINICAL DATA: Shortness of breath

EXAM:
CHEST - 2 VIEW

[chest pa]
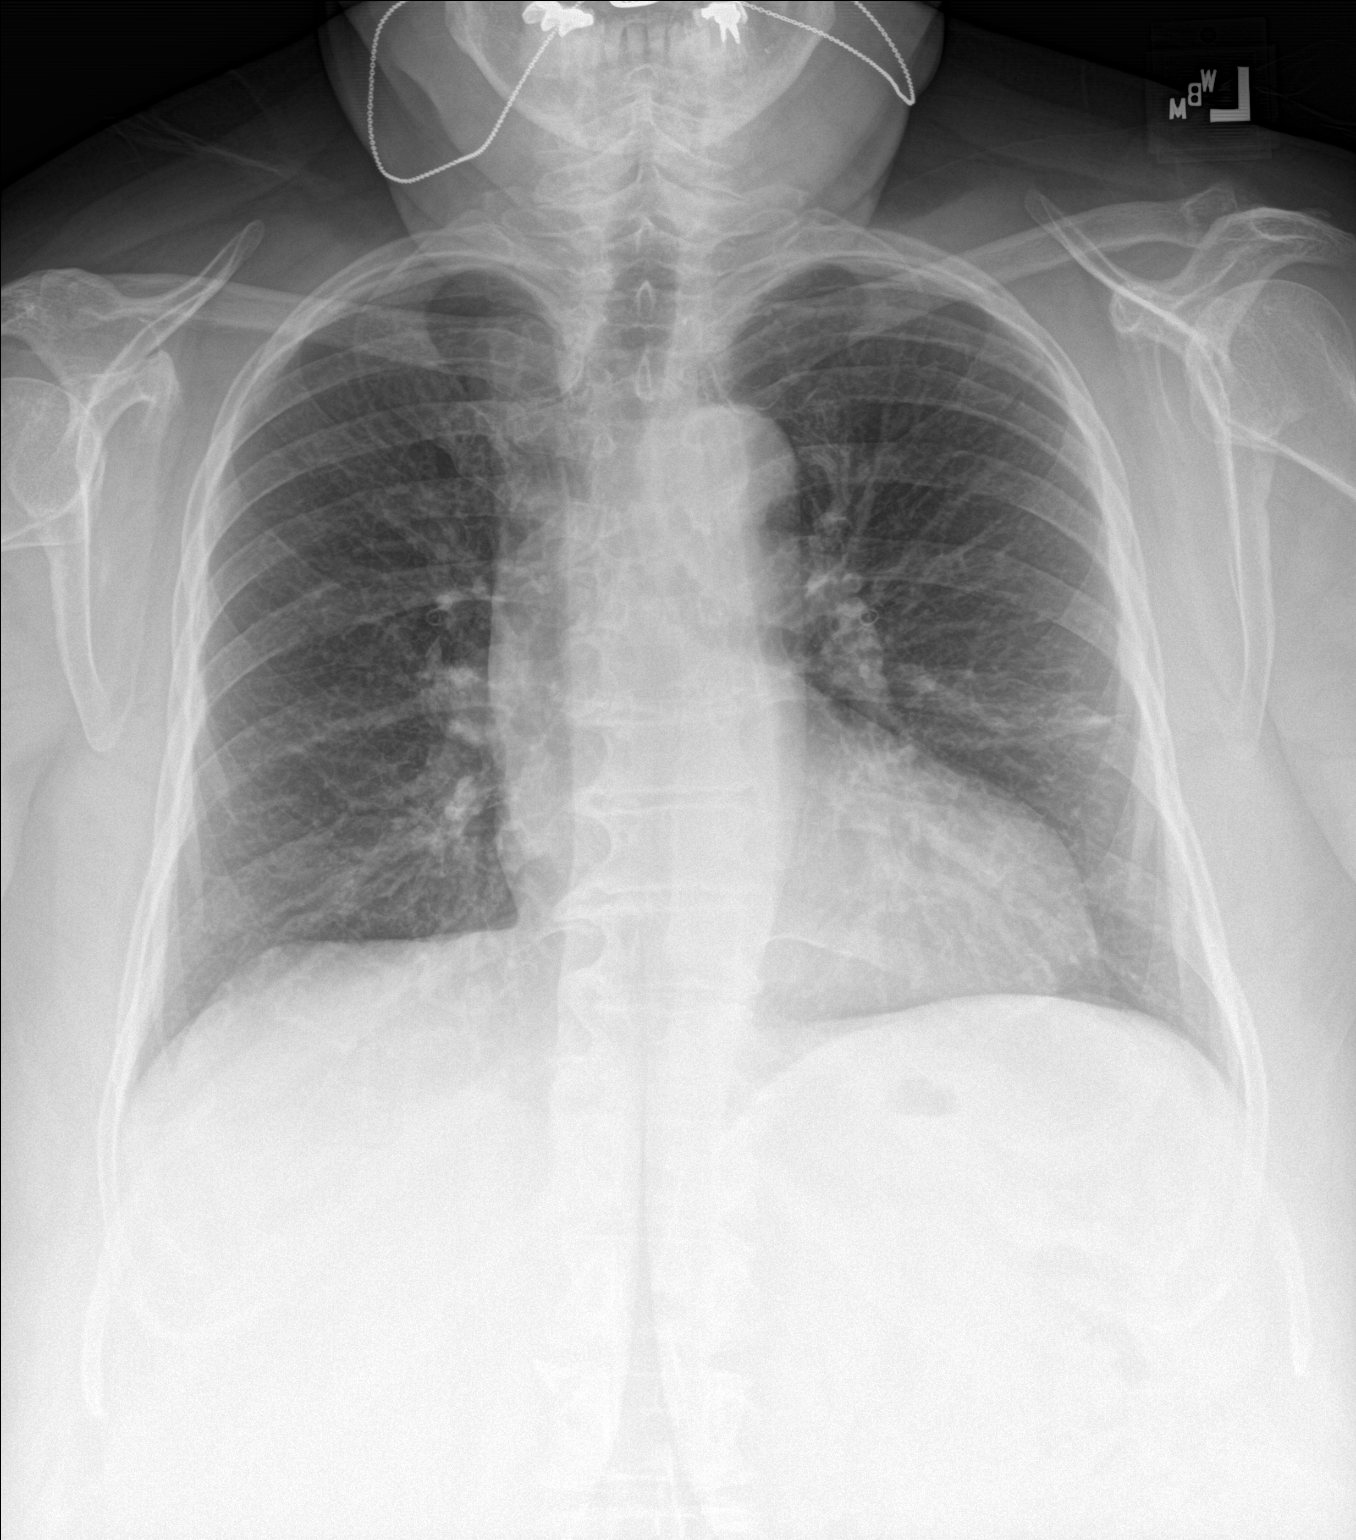

[chest lat]
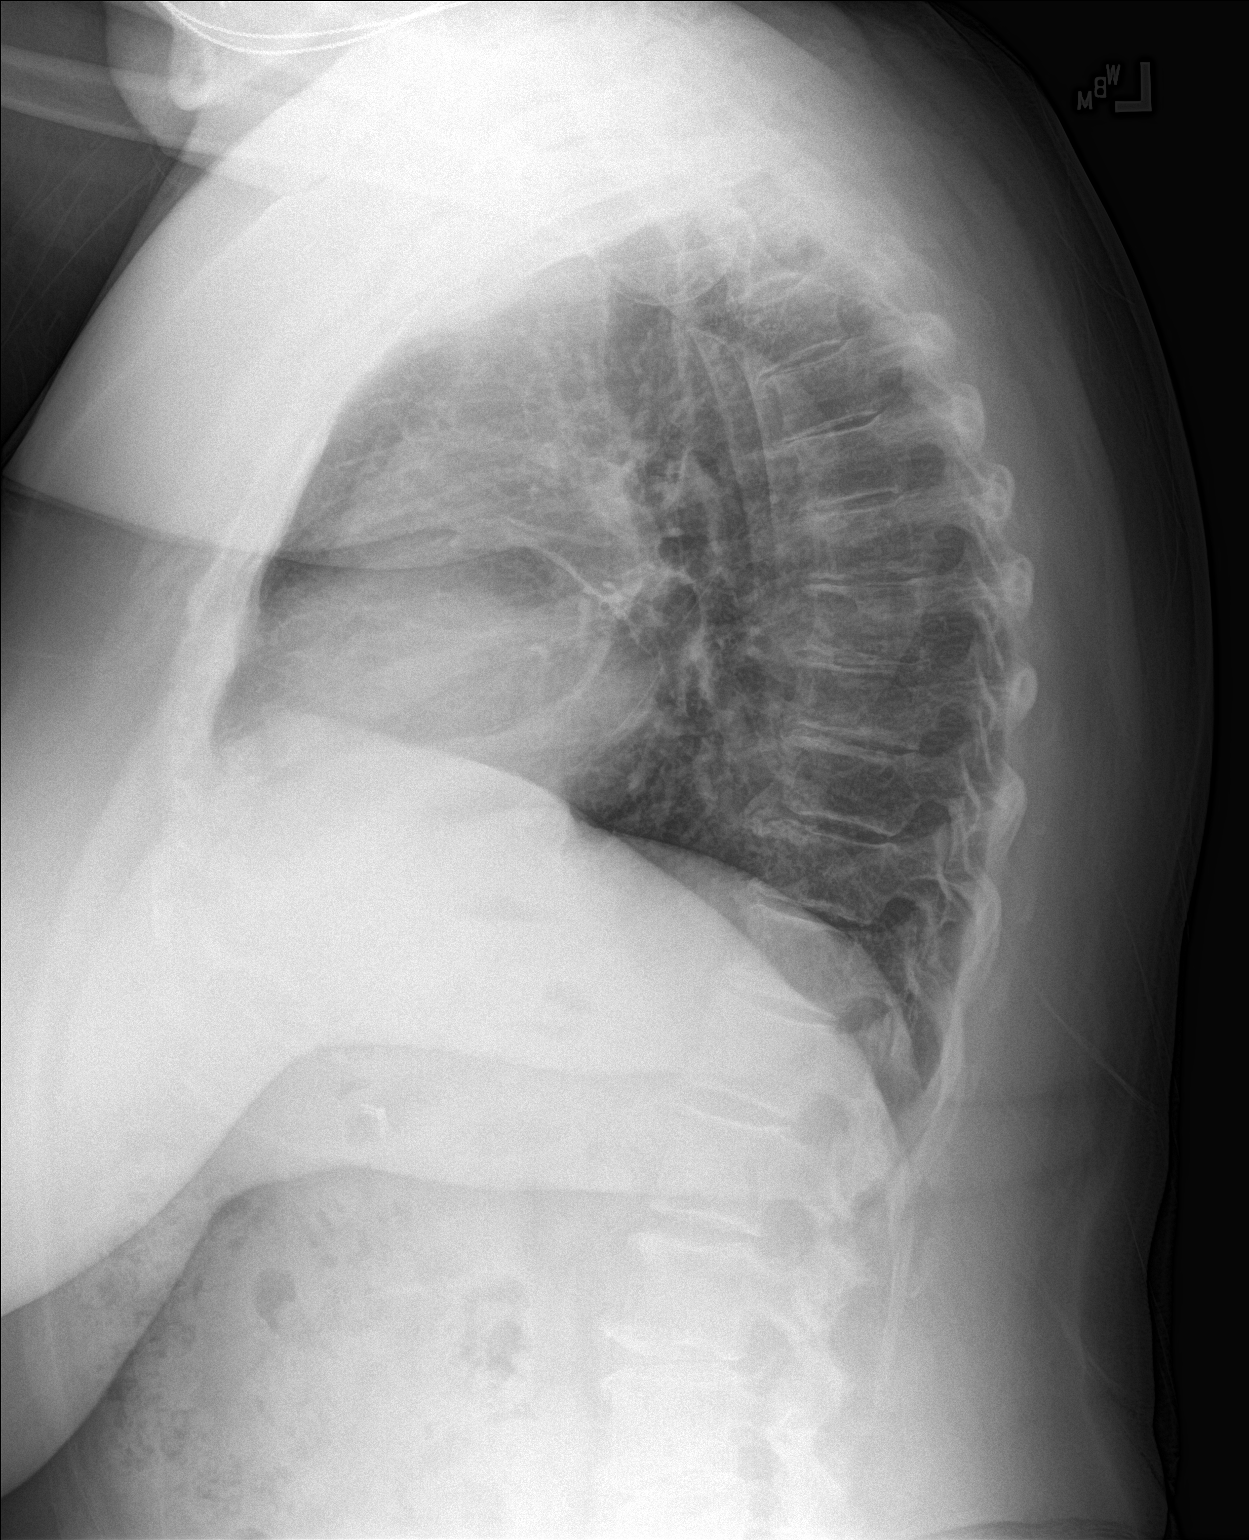

[2 of 2 positions shown; findings below may reference images not displayed]

FINDINGS: Normal cardiac and mediastinal contours. Mild peribronchial cuffing.
No pleural effusion or pneumothorax. No focal consolidative opacity.
No acute osseous abnormality. The abdomen is unremarkable.
IMPRESSION: Peribronchial cuffing, which can be seen with bronchitis or reactive
airway disease.

## 2023-02-19 DIAGNOSIS — Z20822 Contact with and (suspected) exposure to covid-19: Secondary | ICD-10-CM | POA: Diagnosis not present

## 2023-02-19 DIAGNOSIS — R509 Fever, unspecified: Secondary | ICD-10-CM | POA: Diagnosis not present

## 2023-02-19 DIAGNOSIS — U071 COVID-19: Secondary | ICD-10-CM | POA: Diagnosis not present

## 2023-02-27 DIAGNOSIS — U071 COVID-19: Secondary | ICD-10-CM | POA: Diagnosis not present

## 2023-02-27 DIAGNOSIS — J441 Chronic obstructive pulmonary disease with (acute) exacerbation: Secondary | ICD-10-CM | POA: Diagnosis not present

## 2023-03-31 DIAGNOSIS — Z96652 Presence of left artificial knee joint: Secondary | ICD-10-CM | POA: Diagnosis not present

## 2023-04-01 ENCOUNTER — Encounter: Payer: Self-pay | Admitting: Family Medicine

## 2023-04-02 NOTE — Telephone Encounter (Signed)
Called and scheduled patient on 04/03/2023 @ 8:00 am.

## 2023-04-03 ENCOUNTER — Ambulatory Visit
Admission: RE | Admit: 2023-04-03 | Discharge: 2023-04-03 | Disposition: A | Discharge status: Home / Self Care | Payer: BC Managed Care – PPO | Source: Home / Self Care | Attending: Family Medicine | Admitting: Family Medicine

## 2023-04-03 ENCOUNTER — Other Ambulatory Visit: Payer: Self-pay | Admitting: Family Medicine

## 2023-04-03 ENCOUNTER — Ambulatory Visit: Payer: BC Managed Care – PPO | Admitting: Family Medicine

## 2023-04-03 ENCOUNTER — Encounter: Payer: Self-pay | Admitting: Family Medicine

## 2023-04-03 ENCOUNTER — Ambulatory Visit
Admission: RE | Admit: 2023-04-03 | Discharge: 2023-04-03 | Disposition: A | Discharge status: Home / Self Care | Payer: BC Managed Care – PPO | Source: Ambulatory Visit | Attending: Family Medicine | Admitting: Family Medicine

## 2023-04-03 VITALS — BP 143/72 | HR 59 | Wt 177.6 lb

## 2023-04-03 DIAGNOSIS — E1169 Type 2 diabetes mellitus with other specified complication: Secondary | ICD-10-CM

## 2023-04-03 DIAGNOSIS — T466X5A Adverse effect of antihyperlipidemic and antiarteriosclerotic drugs, initial encounter: Secondary | ICD-10-CM

## 2023-04-03 DIAGNOSIS — E785 Hyperlipidemia, unspecified: Secondary | ICD-10-CM | POA: Diagnosis not present

## 2023-04-03 DIAGNOSIS — F3342 Major depressive disorder, recurrent, in full remission: Secondary | ICD-10-CM

## 2023-04-03 DIAGNOSIS — E559 Vitamin D deficiency, unspecified: Secondary | ICD-10-CM

## 2023-04-03 DIAGNOSIS — R61 Generalized hyperhidrosis: Secondary | ICD-10-CM | POA: Diagnosis not present

## 2023-04-03 DIAGNOSIS — G720 Drug-induced myopathy: Secondary | ICD-10-CM

## 2023-04-03 DIAGNOSIS — I10 Essential (primary) hypertension: Secondary | ICD-10-CM | POA: Diagnosis not present

## 2023-04-03 DIAGNOSIS — R6883 Chills (without fever): Secondary | ICD-10-CM | POA: Diagnosis not present

## 2023-04-03 LAB — BAYER DCA HB A1C WAIVED: HB A1C (BAYER DCA - WAIVED): 5.3 % (ref 4.8–5.6)

## 2023-04-03 LAB — MICROALBUMIN, URINE WAIVED
Creatinine, Urine Waived: 200 mg/dL (ref 10–300)
Microalb, Ur Waived: 30 mg/L — ABNORMAL HIGH (ref 0–19)
Microalb/Creat Ratio: <30 mg/g (ref <30)

## 2023-04-03 MED ORDER — LOSARTAN POTASSIUM 25 MG PO TABS: 12.5000 mg | ORAL_TABLET | Freq: Every day | ORAL | 1 refills | Status: DC

## 2023-04-03 MED ORDER — OMEPRAZOLE 40 MG PO CPDR: 40.0000 mg | DELAYED_RELEASE_CAPSULE | Freq: Every day | ORAL | 3 refills | Status: DC

## 2023-04-03 MED ORDER — MONTELUKAST SODIUM 10 MG PO TABS: 10.0000 mg | ORAL_TABLET | Freq: Every day | ORAL | 1 refills | Status: DC

## 2023-04-03 MED ORDER — BUPROPION HCL ER (SR) 150 MG PO TB12: 150.0000 mg | ORAL_TABLET | Freq: Three times a day (TID) | ORAL | 1 refills | Status: DC

## 2023-04-03 MED ORDER — ALBUTEROL SULFATE HFA 108 (90 BASE) MCG/ACT IN AERS: 2.0000 | INHALATION_SPRAY | Freq: Four times a day (QID) | RESPIRATORY_TRACT | 6 refills | Status: DC | PRN

## 2023-04-03 NOTE — Assessment & Plan Note (Signed)
Rechecking labs today. Await results.  

## 2023-04-03 NOTE — Progress Notes (Signed)
BP (!) 143/72   Pulse (!) 59   Wt 177 lb 9.6 oz (80.6 kg)   SpO2 98%   BMI 36.80 kg/m    Subjective:    Patient ID: [name], female    DOB: July 25, 1955, 67 y.o.   MRN: [id number]  HPI: [name] is a 67 y.o. female  Chief Complaint  Patient presents with   Hair/Scalp Problem   Cold Intolerance    [name] presents today not feeling right. She notes that she has been feeling really cold all the time. This has been going on for about 2 months. Nothing seems to set it off. Nothing seems to change it. She 's also noticing that she's losing hair and has been having leg cramps. Has been having night sweats. She had lost weight down to 169 but has gained it back to 177. She is otherwise feeling well and has not noticed any other issues, but she feels like something is not right.  DIABETES Hypoglycemic episodes:no Polydipsia/polyuria: no Visual disturbance: no Chest pain: no Paresthesias: no Glucose Monitoring: yes  Accucheck frequency: occasionally Taking Insulin?: no Blood Pressure Monitoring: rarely Retinal Examination: Up to Date Foot Exam: Up to Date Diabetic Education: Completed Pneumovax: Up to Date Influenza: Not up to Date Aspirin: no  HYPERTENSION / HYPERLIPIDEMIA Satisfied with current treatment? yes Duration of hypertension: chronic BP monitoring frequency: not checking BP medication side effects: no Past BP meds: losartan Duration of hyperlipidemia: chronic Cholesterol medication side effects: yes Cholesterol supplements: none Past cholesterol medications: several statins- side effects Medication compliance: excellent compliance Aspirin: no Recent stressors: no Recurrent headaches: no Visual changes: no Palpitations: no Dyspnea: no Chest pain: no Lower extremity edema: no Dizzy/lightheaded: no   Relevant past medical, surgical, family and social history reviewed and updated as indicated. Interim medical history since our last visit  reviewed. Allergies and medications reviewed and updated.  Review of Systems  Constitutional:  Positive for chills, diaphoresis, fatigue and unexpected weight change. Negative for activity change, appetite change and fever.  HENT: Negative.    Respiratory: Negative.    Cardiovascular:  Positive for leg swelling. Negative for chest pain and palpitations.  Gastrointestinal: Negative.   Musculoskeletal: Negative.   Skin: Negative.   Psychiatric/Behavioral: Negative.      Per HPI unless specifically indicated above     Objective:    BP (!) 143/72   Pulse (!) 59   Wt 177 lb 9.6 oz (80.6 kg)   SpO2 98%   BMI 36.80 kg/m   Wt Readings from Last 3 Encounters:  04/03/23 177 lb 9.6 oz (80.6 kg)  10/17/22 177 lb 6.4 oz (80.5 kg)  06/17/22 181 lb 1.6 oz (82.1 kg)    Physical Exam Vitals and nursing note reviewed.  Constitutional:      General: She is not in acute distress.    Appearance: Normal appearance. She is not ill-appearing, toxic-appearing or diaphoretic.  HENT:     Head: Normocephalic and atraumatic.     Right Ear: External ear normal.     Left Ear: External ear normal.     Nose: Nose normal.     Mouth/Throat:     Mouth: Mucous membranes are moist.     Pharynx: Oropharynx is clear.  Eyes:     General: No scleral icterus.       Right eye: No discharge.        Left eye: No discharge.     Extraocular Movements: Extraocular movements intact.  Conjunctiva/sclera: Conjunctivae normal.     Pupils: Pupils are equal, round, and reactive to light.  Cardiovascular:     Rate and Rhythm: Normal rate and regular rhythm.     Pulses: Normal pulses.     Heart sounds: Normal heart sounds. No murmur heard.    No friction rub. No gallop.  Pulmonary:     Effort: Pulmonary effort is normal. No respiratory distress.     Breath sounds: Normal breath sounds. No stridor. No wheezing, rhonchi or rales.  Chest:     Chest wall: No tenderness.  Musculoskeletal:        General: Normal  range of motion.     Cervical back: Normal range of motion and neck supple.  Skin:    General: Skin is warm and dry.     Capillary Refill: Capillary refill takes less than 2 seconds.     Coloration: Skin is not jaundiced or pale.     Findings: No bruising, erythema, lesion or rash.  Neurological:     General: No focal deficit present.     Mental Status: She is alert and oriented to person, place, and time. Mental status is at baseline.  Psychiatric:        Mood and Affect: Mood normal.        Behavior: Behavior normal.        Thought Content: Thought content normal.        Judgment: Judgment normal.     Results for orders placed or performed in visit on 11/29/22  HM COLONOSCOPY   Collection Time: 07/25/14 12:00 AM  Result Value Ref Range   HM Colonoscopy See Report (in chart) See Report (in chart), Patient Reported      Assessment & Plan:   Problem List Items Addressed This Visit       Cardiovascular and Mediastinum   Essential hypertension   Under fair control on current regimen. Continue current regimen. Continue to monitor. Call with any concerns. Refills given.        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 Await results. Treat as needed.       Relevant Medications   losartan (COZAAR) 25 MG tablet   Other Relevant Orders   Bayer DCA Hb A1c Waived   CBC with Differential/Platelet   Hyperlipidemia associated with type 2 diabetes mellitus (HCC)   Under good control on current regimen. Continue current regimen. Continue to monitor. Call with any concerns. Refills given.        Relevant Medications   losartan (COZAAR) 25 MG tablet   Other Relevant Orders   CBC with Differential/Platelet   Lipid Panel w/o Chol/HDL Ratio   Microalbumin, Urine Waived     Musculoskeletal and Integument   Statin myopathy   Unable to tolerate statins. Continue to  monitor. Call with any concerns.         Other   Vitamin D deficiency - Primary   Rechecking labs today. Await results.       Relevant Orders   VITAMIN D 25 Hydroxy (Vit-D Deficiency, Fractures)   Recurrent major depressive disorder, in full remission (HCC)   Relevant Medications   buPROPion (WELLBUTRIN SR) 150 MG 12 hr tablet   Chills   Pretty non-specific symptoms, but not feeling well. Differential includes hypothyroid, anemia, tick disease, hypoglycemia or b-symptoms. Will check labs and CXR. Await results.  Relevant Orders   TSH   Spotted Fever Group Antibodies   Lyme Disease Serology w/Reflex   Ehrlichia Antibody Panel   DG Chest 2 View (Completed)     Follow up plan: Return for As scheduled.

## 2023-04-03 NOTE — Assessment & Plan Note (Addendum)
Pretty non-specific symptoms, but not feeling well. Differential includes hypothyroid, anemia, tick disease, hypoglycemia or b-symptoms. Will check labs and CXR. Await results.

## 2023-04-03 NOTE — Assessment & Plan Note (Signed)
Under good control on current regimen. Continue current regimen. Continue to monitor. Call with any concerns. Refills given.   

## 2023-04-03 NOTE — Assessment & Plan Note (Signed)
Rechecking labs today. Await results. Treat as needed.  °

## 2023-04-03 NOTE — Assessment & Plan Note (Signed)
Unable to tolerate statins. Continue to monitor. Call with any concerns.  

## 2023-04-03 NOTE — Assessment & Plan Note (Signed)
Under fair control on current regimen. Continue current regimen. Continue to monitor. Call with any concerns. Refills given.   

## 2023-04-04 ENCOUNTER — Encounter: Payer: Self-pay | Admitting: Family Medicine

## 2023-04-05 LAB — EHRLICHIA ANTIBODY PANEL

## 2023-04-05 LAB — SPOTTED FEVER GROUP ANTIBODIES

## 2023-04-05 LAB — CBC WITH DIFFERENTIAL/PLATELET
Basophils Absolute: 0 10*3/uL (ref 0.0–0.2)
Basos: 0 %
EOS (ABSOLUTE): 0.2 10*3/uL (ref 0.0–0.4)
Eos: 4 %
Hematocrit: 39.8 % (ref 34.0–46.6)
Hemoglobin: 12.8 g/dL (ref 11.1–15.9)
Immature Grans (Abs): 0 10*3/uL (ref 0.0–0.1)
Immature Granulocytes: 0 %
Lymphocytes Absolute: 1.6 10*3/uL (ref 0.7–3.1)
Lymphs: 34 %
MCH: 30.3 pg (ref 26.6–33.0)
MCHC: 32.2 g/dL (ref 31.5–35.7)
MCV: 94 fL (ref 79–97)
Monocytes Absolute: 0.4 10*3/uL (ref 0.1–0.9)
Monocytes: 9 %
Neutrophils Absolute: 2.5 10*3/uL (ref 1.4–7.0)
Neutrophils: 53 %
Platelets: 255 10*3/uL (ref 150–450)
RBC: 4.22 x10E6/uL (ref 3.77–5.28)
RDW: 12 % (ref 11.7–15.4)
WBC: 4.8 10*3/uL (ref 3.4–10.8)

## 2023-04-05 LAB — LIPID PANEL W/O CHOL/HDL RATIO
Cholesterol, Total: 170 mg/dL (ref 100–199)
HDL: 68 mg/dL (ref >39)
LDL Chol Calc (NIH): 87 mg/dL (ref 0–99)
Triglycerides: 78 mg/dL (ref 0–149)
VLDL Cholesterol Cal: 15 mg/dL (ref 5–40)

## 2023-04-05 LAB — LYME DISEASE SEROLOGY W/REFLEX

## 2023-04-05 LAB — TSH: TSH: 1.74 u[IU]/mL (ref 0.450–4.500)

## 2023-04-05 LAB — VITAMIN D 25 HYDROXY (VIT D DEFICIENCY, FRACTURES): Vit D, 25-Hydroxy: 39 ng/mL (ref 30.0–100.0)

## 2023-04-09 ENCOUNTER — Encounter: Payer: Self-pay | Admitting: Family Medicine

## 2023-04-09 NOTE — Telephone Encounter (Signed)
 Care team updated and letter sent for eye exam notes.

## 2023-04-21 ENCOUNTER — Ambulatory Visit (INDEPENDENT_AMBULATORY_CARE_PROVIDER_SITE_OTHER): Payer: BC Managed Care – PPO | Admitting: Family Medicine

## 2023-04-21 ENCOUNTER — Encounter: Payer: Self-pay | Admitting: Family Medicine

## 2023-04-21 VITALS — BP 128/84 | HR 68 | Ht <= 58 in | Wt 176.2 lb

## 2023-04-21 DIAGNOSIS — Z0000 Encounter for general adult medical examination without abnormal findings: Secondary | ICD-10-CM | POA: Diagnosis not present

## 2023-04-21 DIAGNOSIS — Z23 Encounter for immunization: Secondary | ICD-10-CM | POA: Diagnosis not present

## 2023-04-21 DIAGNOSIS — Z1231 Encounter for screening mammogram for malignant neoplasm of breast: Secondary | ICD-10-CM | POA: Diagnosis not present

## 2023-04-21 MED ORDER — TIRZEPATIDE 12.5 MG/0.5ML ~~LOC~~ SOAJ: 12.5000 mg | SUBCUTANEOUS | 1 refills | Status: DC

## 2023-04-21 MED ORDER — HYOSCYAMINE SULFATE 0.125 MG SL SUBL: SUBLINGUAL_TABLET | SUBLINGUAL | 1 refills | Status: DC

## 2023-04-21 NOTE — Patient Instructions (Signed)
Please call to schedule your mammogram and/or bone density: [facility]  Address: [address], [place], [place] [address] Phone: [phone]  [facility] [place] [address] [place],  [place]  [address] Phone: [phone]   

## 2023-04-21 NOTE — Progress Notes (Signed)
BP 128/84   Pulse 68   Ht 4' 9.5" (1.461 m)   Wt 176 lb 3.2 oz (79.9 kg)   SpO2 97%   BMI 37.47 kg/m    Subjective:    Patient ID: [name], female    DOB: 1955-06-08, 67 y.o.   MRN: [id number]  HPI: [name] is a 67 y.o. female presenting on 04/21/2023 for comprehensive medical examination. Current medical complaints include:none  Menopausal Symptoms: no  Depression Screen done today and results listed below:     04/03/2023    8:14 AM 10/17/2022    8:16 AM 06/17/2022    8:41 AM 04/17/2022    8:10 AM 10/04/2021    9:59 AM  Depression screen PHQ 2/9  Decreased Interest 0 0 0 0 0  Down, Depressed, Hopeless 0 0 0 0 0  PHQ - 2 Score 0 0 0 0 0  Altered sleeping 0 0 0 0 0  Tired, decreased energy 0 0 0 0 0  Change in appetite 0 0 0 0 0  Feeling bad or failure about yourself  0 0 0 0 0  Trouble concentrating 0 0 0 0 0  Moving slowly or fidgety/restless 0 0 0  0  Suicidal thoughts 0 0 0 0 0  PHQ-9 Score 0 0 0 0 0  Difficult doing work/chores Not difficult at all Not difficult at all Not difficult at all Not difficult at all Not difficult at all    Past Medical History:  Past Medical History:  Diagnosis Date   Asthma    Depression    Diabetes mellitus without complication (HCC)    Hyperlipidemia    Kidney stones    Migraine headache     Surgical History:  Past Surgical History:  Procedure Laterality Date   ABDOMINAL HYSTERECTOMY     BREAST CYST ASPIRATION Right 1981   benign   BREAST CYST ASPIRATION Right 1984   benign   CHOLECYSTECTOMY     COLONOSCOPY  March 2016   FLEXIBLE BRONCHOSCOPY N/A 01/26/2018   Procedure: FLEXIBLE BRONCHOSCOPY;  Surgeon: Shane Crutch, MD;  Location: [facility] ORS;  Service: Pulmonary;  Laterality: N/A;   MENISCUS REPAIR Left 09/18/2018   OOPHORECTOMY     PARTIAL KNEE ARTHROPLASTY Left 02/2019    Medications:  Current Outpatient Medications on File Prior to Visit  Medication Sig   albuterol (PROAIR HFA) 108  (90 Base) MCG/ACT inhaler Inhale 2 puffs into the lungs every 6 (six) hours as needed for wheezing or shortness of breath.   albuterol (PROVENTIL) (2.5 MG/3ML) 0.083% nebulizer solution USE 1 VIAL VIA NEBULIZER 1 TO 2 TIMES A DAY AS DIRECTED   Budeson-Glycopyrrol-Formoterol (BREZTRI AEROSPHERE) 160-9-4.8 MCG/ACT AERO Inhale 2 puffs into the lungs 2 (two) times daily.   buPROPion (WELLBUTRIN SR) 150 MG 12 hr tablet Take 1 tablet (150 mg total) by mouth 3 (three) times daily.   CELEBREX 200 MG capsule Take 1 capsule every day by oral route with meal(s).   diphenoxylate-atropine (LOMOTIL) 2.5-0.025 MG tablet    fluticasone (FLONASE) 50 MCG/ACT nasal spray Place 2 sprays into both nostrils 2 (two) times daily.   losartan (COZAAR) 25 MG tablet Take 0.5 tablets (12.5 mg total) by mouth daily.   montelukast (SINGULAIR) 10 MG tablet Take 1 tablet (10 mg total) by mouth at bedtime.   ondansetron (ZOFRAN) 8 MG tablet Take 1 tablet (8 mg total) by mouth every 8 (eight) hours as needed for nausea or vomiting.   Vibegron (  GEMTESA) 75 MG TABS Take 1 tablet by mouth daily.   No current facility-administered medications on file prior to visit.    Allergies:  Allergies  Allergen Reactions   Cymbalta [Duloxetine Hcl] Other (See Comments)    Hallucinations   Amoxicillin Hives   Atorvastatin Other (See Comments)    myalgias    Social History:  Social History   Socioeconomic History   Marital status: Single    Spouse name: Not on file   Number of children: Not on file   Years of education: Not on file   Highest education level: Bachelor's degree (e.g., BA, AB, BS)  Occupational History   Not on file  Tobacco Use   Smoking status: Never   Smokeless tobacco: Never  Vaping Use   Vaping status: Never Used  Substance and Sexual Activity   Alcohol use: Yes    Comment: social   Drug use: No   Sexual activity: Not Currently  Other Topics Concern   Not on file  Social History Narrative   Not on  file   Social Drivers of Health   Financial Resource Strain: Low Risk  (10/13/2022)   Overall Financial Resource Strain (CARDIA)    Difficulty of Paying Living Expenses: Not hard at all  Food Insecurity: No Food Insecurity (10/13/2022)   Hunger Vital Sign    Worried About Running Out of Food in the Last Year: Never true    Ran Out of Food in the Last Year: Never true  Transportation Needs: No Transportation Needs (10/13/2022)   PRAPARE - Administrator, Civil Service (Medical): No    Lack of Transportation (Non-Medical): No  Physical Activity: Sufficiently Active (10/13/2022)   Exercise Vital Sign    Days of Exercise per Week: 7 days    Minutes of Exercise per Session: 30 min  Stress: No Stress Concern Present (10/13/2022)   [employer] of Occupational Health - Occupational Stress Questionnaire    Feeling of Stress : Not at all  Social Connections: Moderately Integrated (10/13/2022)   Social Connection and Isolation Panel [NHANES]    Frequency of Communication with Friends and Family: More than three times a week    Frequency of Social Gatherings with Friends and Family: More than three times a week    Attends Religious Services: More than 4 times per year    Active Member of [employer] or Organizations: Yes    Attends Banker Meetings: 1 to 4 times per year    Marital Status: Divorced  Catering manager Violence: Not on file   Social History   Tobacco Use  Smoking Status Never  Smokeless Tobacco Never   Social History   Substance and Sexual Activity  Alcohol Use Yes   Comment: social    Family History:  Family History  Problem Relation Age of Onset   Hypertension Mother    Cancer Mother        breast   Arthritis Mother    Alzheimer's disease Mother    Parkinson's disease Mother    Breast cancer Mother 36   Hypertension Father    Diabetes Father    Kidney cancer Father    Breast cancer Cousin 35   Breast cancer Maternal Aunt    Breast  cancer Maternal Grandmother 71    Past medical history, surgical history, medications, allergies, family history and social history reviewed with patient today and changes made to appropriate areas of the chart.   Review of Systems  Constitutional:  Negative.   HENT: Negative.         Itchy ears  Eyes: Negative.   Respiratory:  Positive for cough. Negative for hemoptysis, sputum production, shortness of breath and wheezing.   Cardiovascular: Negative.   Gastrointestinal:  Positive for constipation and diarrhea. Negative for abdominal pain, blood in stool, heartburn, melena, nausea and vomiting.  Genitourinary: Negative.   Musculoskeletal: Negative.   Skin: Negative.   Neurological: Negative.   Endo/Heme/Allergies: Negative.   Psychiatric/Behavioral: Negative.     All other ROS negative except what is listed above and in the HPI.      Objective:    BP 128/84   Pulse 68   Ht 4' 9.5" (1.461 m)   Wt 176 lb 3.2 oz (79.9 kg)   SpO2 97%   BMI 37.47 kg/m   Wt Readings from Last 3 Encounters:  04/21/23 176 lb 3.2 oz (79.9 kg)  04/03/23 177 lb 9.6 oz (80.6 kg)  10/17/22 177 lb 6.4 oz (80.5 kg)    Physical Exam Vitals and nursing note reviewed.  Constitutional:      General: She is not in acute distress.    Appearance: Normal appearance. She is obese. She is not ill-appearing, toxic-appearing or diaphoretic.  HENT:     Head: Normocephalic and atraumatic.     Right Ear: Tympanic membrane, ear canal and external ear normal. There is no impacted cerumen.     Left Ear: Tympanic membrane, ear canal and external ear normal. There is no impacted cerumen.     Nose: Nose normal. No congestion or rhinorrhea.     Mouth/Throat:     Mouth: Mucous membranes are moist.     Pharynx: Oropharynx is clear. No oropharyngeal exudate or posterior oropharyngeal erythema.  Eyes:     General: No scleral icterus.       Right eye: No discharge.        Left eye: No discharge.     Extraocular Movements:  Extraocular movements intact.     Conjunctiva/sclera: Conjunctivae normal.     Pupils: Pupils are equal, round, and reactive to light.  Neck:     Vascular: No carotid bruit.  Cardiovascular:     Rate and Rhythm: Normal rate and regular rhythm.     Pulses: Normal pulses.     Heart sounds: No murmur heard.    No friction rub. No gallop.  Pulmonary:     Effort: Pulmonary effort is normal. No respiratory distress.     Breath sounds: Normal breath sounds. No stridor. No wheezing, rhonchi or rales.  Chest:     Chest wall: No tenderness.  Abdominal:     General: Abdomen is flat. Bowel sounds are normal. There is no distension.     Palpations: Abdomen is soft. There is no mass.     Tenderness: There is no abdominal tenderness. There is no right CVA tenderness, left CVA tenderness, guarding or rebound.     Hernia: No hernia is present.  Genitourinary:    Comments: Breast and pelvic exams deferred with shared decision making Musculoskeletal:        General: No swelling, tenderness, deformity or signs of injury.     Cervical back: Normal range of motion and neck supple. No rigidity. No muscular tenderness.     Right lower leg: No edema.     Left lower leg: No edema.  Lymphadenopathy:     Cervical: No cervical adenopathy.  Skin:    General: Skin is warm and dry.  Capillary Refill: Capillary refill takes less than 2 seconds.     Coloration: Skin is not jaundiced or pale.     Findings: No bruising, erythema, lesion or rash.  Neurological:     General: No focal deficit present.     Mental Status: She is alert and oriented to person, place, and time. Mental status is at baseline.     Cranial Nerves: No cranial nerve deficit.     Sensory: No sensory deficit.     Motor: No weakness.     Coordination: Coordination normal.     Gait: Gait normal.     Deep Tendon Reflexes: Reflexes normal.  Psychiatric:        Mood and Affect: Mood normal.        Behavior: Behavior normal.        Thought  Content: Thought content normal.        Judgment: Judgment normal.     Results for orders placed or performed in visit on 04/14/23  HM DIABETES EYE EXAM   Collection Time: 06/03/22  9:13 AM  Result Value Ref Range   HM Diabetic Eye Exam No Retinopathy No Retinopathy      Assessment & Plan:   Problem List Items Addressed This Visit   None Visit Diagnoses       Routine general medical examination at a health care facility    -  Primary   Vaccines up to date. Screening labs checked last visit. Mammo ordered today. Continue diet and exercise. Call with any concerns.     Need for vaccination for pneumococcus       Pneumovax given today.   Relevant Orders   Pneumococcal polysaccharide vaccine 23-valent greater than or equal to 2yo subcutaneous/IM (Completed)     Encounter for screening mammogram for malignant neoplasm of breast       Mammo ordered today.   Relevant Orders   MM 3D SCREENING MAMMOGRAM BILATERAL BREAST        Follow up plan: Return in about 6 months (around 10/20/2023).   LABORATORY TESTING:  - Pap smear: not applicable  IMMUNIZATIONS:   - Tdap: Tetanus vaccination status reviewed: last tetanus booster within 10 years. - Influenza: Refused - Pneumovax: Administered today - Prevnar: Up to date - COVID: Refused - HPV: Not applicable - Shingrix vaccine: Up to date  SCREENING: -Mammogram: Ordered today  - Colonoscopy: Up to date  - Bone Density: Up to date   PATIENT COUNSELING:   Advised to take 1 mg of folate supplement per day if capable of pregnancy.   Sexuality: Discussed sexually transmitted diseases, partner selection, use of condoms, avoidance of unintended pregnancy  and contraceptive alternatives.   Advised to avoid cigarette smoking.  I discussed with the patient that most people either abstain from alcohol or drink within safe limits (<=14/week and <=4 drinks/occasion for males, <=7/weeks and <= 3 drinks/occasion for females) and that the risk  for alcohol disorders and other health effects rises proportionally with the number of drinks per week and how often a drinker exceeds daily limits.  Discussed cessation/primary prevention of drug use and availability of treatment for abuse.   Diet: Encouraged to adjust caloric intake to maintain  or achieve ideal body weight, to reduce intake of dietary saturated fat and total fat, to limit sodium intake by avoiding high sodium foods and not adding table salt, and to maintain adequate dietary potassium and calcium preferably from fresh fruits, vegetables, and low-fat dairy products.  stressed the importance of regular exercise  Injury prevention: Discussed safety belts, safety helmets, smoke detector, smoking near bedding or upholstery.   Dental health: Discussed importance of regular tooth brushing, flossing, and dental visits.    NEXT PREVENTATIVE PHYSICAL DUE IN 1 YEAR. Return in about 6 months (around 10/20/2023).

## 2023-06-06 ENCOUNTER — Ambulatory Visit: Payer: BC Managed Care – PPO | Admitting: Nurse Practitioner

## 2023-06-06 ENCOUNTER — Encounter: Payer: Self-pay | Admitting: Nurse Practitioner

## 2023-06-06 VITALS — BP 122/75 | HR 66 | Temp 98.4°F | Ht <= 58 in | Wt 175.0 lb

## 2023-06-06 DIAGNOSIS — J0110 Acute frontal sinusitis, unspecified: Secondary | ICD-10-CM

## 2023-06-06 DIAGNOSIS — J329 Chronic sinusitis, unspecified: Secondary | ICD-10-CM | POA: Insufficient documentation

## 2023-06-06 MED ORDER — AZITHROMYCIN 250 MG PO TABS: ORAL_TABLET | ORAL | 0 refills | Status: AC

## 2023-06-06 MED ORDER — PREDNISONE 20 MG PO TABS: 40.0000 mg | ORAL_TABLET | Freq: Every day | ORAL | 0 refills | Status: AC

## 2023-06-06 MED ORDER — HYDROCOD POLI-CHLORPHE POLI ER 10-8 MG/5ML PO SUER: 5.0000 mL | Freq: Every evening | ORAL | 0 refills | Status: DC | PRN

## 2023-06-06 NOTE — Assessment & Plan Note (Signed)
Acute for over one week with cough not improving.  Start Zpack and Prednisone 40 MG daily for 5 days.  Tussionex for cough symptoms at night.  Continue current OTC regimen.  Recommend: - Increased rest - Increasing Fluids - Acetaminophen / ibuprofen as needed for fever/pain.  - Salt water gargling, chloraseptic spray and throat lozenges - Mucinex.  - Humidifying the air.

## 2023-06-06 NOTE — Progress Notes (Signed)
BP 122/75 (BP Location: Right Arm, Patient Position: Sitting, Cuff Size: Normal)   Pulse 66   Temp 98.4 F (36.9 C) (Oral)   Ht 4\' 9"  (1.448 m)   Wt 175 lb (79.4 kg)   SpO2 99%   BMI 37.87 kg/m    Subjective:    Patient ID: [name], female    DOB: Jun 05, 1955, 68 y.o.   MRN: [id number]  HPI: [name] is a 68 y.o. female  Chief Complaint  Patient presents with   URI    Coughing real bad, has very bad asthma can't sleep, its been going on for a week Patient states she took everything over the counter   UPPER RESPIRATORY TRACT INFECTION Has been present over one week.  Has tried all the over the counter medications. Fever: yes 100.2 last night Cough: yes Shortness of breath: yes Wheezing: yes Chest pain: no Chest tightness: yes Chest congestion: yes Nasal congestion: yes Runny nose: no Post nasal drip: no Sneezing: no Sore throat: yes Swollen glands: no Sinus pressure: no Headache: no Face pain: no Toothache: no Ear pain: yes left Ear pressure: yes left Eyes red/itching:no Eye drainage/crusting: no  Vomiting: no Rash: no Fatigue: yes Sick contacts: no Strep contacts: no  Context: fluctuating Recurrent sinusitis: no Relief with OTC cold/cough medications: yes  Treatments attempted: cold/sinus and mucinex    Relevant past medical, surgical, family and social history reviewed and updated as indicated. Interim medical history since our last visit reviewed. Allergies and medications reviewed and updated.  Review of Systems  Constitutional:  Positive for fatigue and fever. Negative for activity change and appetite change.  HENT:  Positive for congestion, postnasal drip, rhinorrhea, sinus pressure and sore throat. Negative for ear discharge, ear pain, facial swelling, sinus pain, sneezing and voice change.   Respiratory:  Positive for cough, chest tightness, shortness of breath and wheezing.   Cardiovascular:  Negative for chest pain,  palpitations and leg swelling.  Gastrointestinal: Negative.   Neurological:  Positive for headaches. Negative for numbness.  Psychiatric/Behavioral: Negative.      Per HPI unless specifically indicated above     Objective:    BP 122/75 (BP Location: Right Arm, Patient Position: Sitting, Cuff Size: Normal)   Pulse 66   Temp 98.4 F (36.9 C) (Oral)   Ht 4\' 9"  (1.448 m)   Wt 175 lb (79.4 kg)   SpO2 99%   BMI 37.87 kg/m   Wt Readings from Last 3 Encounters:  06/06/23 175 lb (79.4 kg)  04/21/23 176 lb 3.2 oz (79.9 kg)  04/03/23 177 lb 9.6 oz (80.6 kg)    Physical Exam Vitals and nursing note reviewed.  Constitutional:      General: She is awake. She is not in acute distress.    Appearance: She is well-developed and well-groomed. She is obese. She is not ill-appearing or toxic-appearing.  HENT:     Head: Normocephalic.     Right Ear: Hearing, ear canal and external ear normal. A middle ear effusion is present. Tympanic membrane is not injected or perforated.     Left Ear: Hearing, ear canal and external ear normal. A middle ear effusion is present. Tympanic membrane is not injected or perforated.     Nose: Rhinorrhea present. Rhinorrhea is clear.     Right Sinus: No maxillary sinus tenderness or frontal sinus tenderness.     Left Sinus: No maxillary sinus tenderness or frontal sinus tenderness.     Mouth/Throat:  Mouth: Mucous membranes are moist.     Pharynx: Posterior oropharyngeal erythema present. No pharyngeal swelling or oropharyngeal exudate.  Eyes:     General: Lids are normal.        Right eye: No discharge.        Left eye: No discharge.     Conjunctiva/sclera: Conjunctivae normal.     Pupils: Pupils are equal, round, and reactive to light.  Neck:     Thyroid: No thyromegaly.     Vascular: No carotid bruit.  Cardiovascular:     Rate and Rhythm: Normal rate and regular rhythm.     Heart sounds: Normal heart sounds. No murmur heard.    No gallop.  Pulmonary:      Effort: Pulmonary effort is normal. No accessory muscle usage or respiratory distress.     Breath sounds: Wheezing present. No decreased breath sounds, rhonchi or rales.  Abdominal:     General: Bowel sounds are normal.     Palpations: Abdomen is soft. There is no hepatomegaly or splenomegaly.  Musculoskeletal:     Cervical back: Normal range of motion and neck supple.     Right lower leg: No edema.     Left lower leg: No edema.  Lymphadenopathy:     Head:     Right side of head: Submandibular adenopathy present. No submental, tonsillar, preauricular or posterior auricular adenopathy.     Left side of head: Submandibular adenopathy present. No submental, tonsillar, preauricular or posterior auricular adenopathy.     Cervical: No cervical adenopathy.  Skin:    General: Skin is warm and dry.  Neurological:     Mental Status: She is alert and oriented to person, place, and time.     Deep Tendon Reflexes:     Reflex Scores:      Brachioradialis reflexes are 2+ on the right side and 2+ on the left side.      Patellar reflexes are 2+ on the right side and 2+ on the left side. Psychiatric:        Attention and Perception: Attention normal.        Mood and Affect: Mood normal.        Speech: Speech normal.        Behavior: Behavior normal. Behavior is cooperative.        Thought Content: Thought content normal.    Results for orders placed or performed in visit on 04/14/23  HM DIABETES EYE EXAM   Collection Time: 06/03/22  9:13 AM  Result Value Ref Range   HM Diabetic Eye Exam No Retinopathy No Retinopathy      Assessment & Plan:   Problem List Items Addressed This Visit       Respiratory   Sinusitis - Primary   Acute for over one week with cough not improving.  Start Zpack and Prednisone 40 MG daily for 5 days.  Tussionex for cough symptoms at night.  Continue current OTC regimen.  Recommend: - Increased rest - Increasing Fluids - Acetaminophen / ibuprofen as needed for  fever/pain.  - Salt water gargling, chloraseptic spray and throat lozenges - Mucinex.  - Humidifying the air.       Relevant Medications   azithromycin (ZITHROMAX) 250 MG tablet   predniSONE (DELTASONE) 20 MG tablet   chlorpheniramine-HYDROcodone (TUSSIONEX) 10-8 MG/5ML     Follow up plan: Return if symptoms worsen or fail to improve.

## 2023-06-06 NOTE — Patient Instructions (Signed)

## 2023-10-03 ENCOUNTER — Ambulatory Visit
Admission: RE | Admit: 2023-10-03 | Discharge: 2023-10-03 | Disposition: A | Discharge status: Home / Self Care | Source: Ambulatory Visit | Attending: Family Medicine

## 2023-10-03 DIAGNOSIS — Z1231 Encounter for screening mammogram for malignant neoplasm of breast: Secondary | ICD-10-CM | POA: Insufficient documentation

## 2023-10-08 ENCOUNTER — Ambulatory Visit: Payer: Self-pay | Admitting: Family Medicine

## 2023-10-21 ENCOUNTER — Encounter: Payer: Self-pay | Admitting: Family Medicine

## 2023-10-21 ENCOUNTER — Ambulatory Visit: Payer: Self-pay | Admitting: Family Medicine

## 2023-10-21 VITALS — BP 120/80 | HR 79 | Temp 98.0°F | Ht <= 58 in | Wt 181.4 lb

## 2023-10-21 DIAGNOSIS — F3342 Major depressive disorder, recurrent, in full remission: Secondary | ICD-10-CM

## 2023-10-21 DIAGNOSIS — E1169 Type 2 diabetes mellitus with other specified complication: Secondary | ICD-10-CM

## 2023-10-21 DIAGNOSIS — E785 Hyperlipidemia, unspecified: Secondary | ICD-10-CM | POA: Diagnosis not present

## 2023-10-21 DIAGNOSIS — E559 Vitamin D deficiency, unspecified: Secondary | ICD-10-CM

## 2023-10-21 DIAGNOSIS — I10 Essential (primary) hypertension: Secondary | ICD-10-CM | POA: Diagnosis not present

## 2023-10-21 LAB — MICROALBUMIN, URINE WAIVED
Creatinine, Urine Waived: 200 mg/dL (ref 10–300)
Microalb, Ur Waived: 30 mg/L — ABNORMAL HIGH (ref 0–19)
Microalb/Creat Ratio: <30 mg/g (ref <30)

## 2023-10-21 LAB — BAYER DCA HB A1C WAIVED: HB A1C (BAYER DCA - WAIVED): 5.5 % (ref 4.8–5.6)

## 2023-10-21 MED ORDER — LOSARTAN POTASSIUM 25 MG PO TABS: 12.5000 mg | ORAL_TABLET | Freq: Every day | ORAL | 1 refills | Status: DC

## 2023-10-21 MED ORDER — FLUTICASONE PROPIONATE 50 MCG/ACT NA SUSP: 2.0000 | Freq: Two times a day (BID) | NASAL | 4 refills | Status: DC

## 2023-10-21 MED ORDER — TIRZEPATIDE 15 MG/0.5ML ~~LOC~~ SOAJ: 15.0000 mg | SUBCUTANEOUS | 1 refills | Status: DC

## 2023-10-21 MED ORDER — ALBUTEROL SULFATE HFA 108 (90 BASE) MCG/ACT IN AERS: 2.0000 | INHALATION_SPRAY | Freq: Four times a day (QID) | RESPIRATORY_TRACT | 6 refills | Status: DC | PRN

## 2023-10-21 MED ORDER — BUPROPION HCL ER (SR) 150 MG PO TB12: 150.0000 mg | ORAL_TABLET | Freq: Three times a day (TID) | ORAL | 1 refills | Status: DC

## 2023-10-21 MED ORDER — MONTELUKAST SODIUM 10 MG PO TABS: 10.0000 mg | ORAL_TABLET | Freq: Every day | ORAL | 1 refills | Status: DC

## 2023-10-21 MED ORDER — HYOSCYAMINE SULFATE 0.125 MG SL SUBL: SUBLINGUAL_TABLET | SUBLINGUAL | 1 refills | Status: DC

## 2023-10-21 MED ORDER — BREZTRI AEROSPHERE 160-9-4.8 MCG/ACT IN AERO: 2.0000 | INHALATION_SPRAY | Freq: Two times a day (BID) | RESPIRATORY_TRACT | 11 refills | Status: DC

## 2023-10-21 MED ORDER — ONDANSETRON HCL 8 MG PO TABS: 8.0000 mg | ORAL_TABLET | Freq: Three times a day (TID) | ORAL | 0 refills | Status: DC | PRN

## 2023-10-21 NOTE — Progress Notes (Signed)
 BP 120/80   Pulse 79   Temp 98 F (36.7 C) (Oral)   Ht 4' 9 (1.448 m)   Wt 181 lb 6.4 oz (82.3 kg)   SpO2 98%   BMI 39.25 kg/m    Subjective:    Patient ID: [name], female    DOB: 1955-10-31, 68 y.o.   MRN: [id number]  HPI: [name] is a 68 y.o. female  Chief Complaint  Patient presents with   Diabetes   DIABETES Hypoglycemic episodes:no Polydipsia/polyuria: no Visual disturbance: no Chest pain: no Paresthesias: no Glucose Monitoring: no  Accucheck frequency: Not Checking Taking Insulin?: no Blood Pressure Monitoring: rarely Retinal Examination: Not up to Date Foot Exam: Up to Date Diabetic Education: Completed Pneumovax: Up to Date Influenza: Up to Date Aspirin: no  HYPERTENSION / HYPERLIPIDEMIA Satisfied with current treatment? yes Duration of hypertension: chronic BP monitoring frequency: rarely BP medication side effects: no Past BP meds: losartan  Duration of hyperlipidemia: chronic Cholesterol medication side effects: yes Cholesterol supplements: none Past cholesterol medications: none Medication compliance: excellent compliance Aspirin: no Recent stressors: no Recurrent headaches: no Visual changes: no Palpitations: no Dyspnea: no Chest pain: no Lower extremity edema: no Dizzy/lightheaded: no  DEPRESSION Mood status: controlled Satisfied with current treatment?: yes Symptom severity: mild  Duration of current treatment : chronic Side effects: no Medication compliance: excellent compliance Psychotherapy/counseling: no  Previous psychiatric medications: wellbutrin  Depressed mood: no Anxious mood: no Anhedonia: no Significant weight loss or gain: no Insomnia: no  Fatigue: no Feelings of worthlessness or guilt: no Impaired concentration/indecisiveness: no Suicidal ideations: no Hopelessness: no Crying spells: no    10/21/2023    8:29 AM 04/03/2023    8:14 AM 10/17/2022    8:16 AM 06/17/2022    8:41 AM  04/17/2022    8:10 AM  Depression screen PHQ 2/9  Decreased Interest 0 0 0 0 0  Down, Depressed, Hopeless 0 0 0 0 0  PHQ - 2 Score 0 0 0 0 0  Altered sleeping 0 0 0 0 0  Tired, decreased energy 0 0 0 0 0  Change in appetite 0 0 0 0 0  Feeling bad or failure about yourself  0 0 0 0 0  Trouble concentrating 0 0 0 0 0  Moving slowly or fidgety/restless 0 0 0 0   Suicidal thoughts 0 0 0 0 0  PHQ-9 Score 0 0 0 0 0  Difficult doing work/chores  Not difficult at all Not difficult at all Not difficult at all Not difficult at all     Relevant past medical, surgical, family and social history reviewed and updated as indicated. Interim medical history since our last visit reviewed. Allergies and medications reviewed and updated.  Review of Systems  Constitutional:  Positive for fatigue. Negative for activity change, appetite change, chills, diaphoresis, fever and unexpected weight change.  Respiratory: Negative.    Cardiovascular: Negative.   Musculoskeletal: Negative.   Neurological: Negative.   Psychiatric/Behavioral: Negative.      Per HPI unless specifically indicated above     Objective:    BP 120/80   Pulse 79   Temp 98 F (36.7 C) (Oral)   Ht 4' 9 (1.448 m)   Wt 181 lb 6.4 oz (82.3 kg)   SpO2 98%   BMI 39.25 kg/m   Wt Readings from Last 3 Encounters:  10/21/23 181 lb 6.4 oz (82.3 kg)  06/06/23 175 lb (79.4 kg)  04/21/23 176 lb 3.2 oz (79.9 kg)  Physical Exam Vitals and nursing note reviewed.  Constitutional:      General: She is not in acute distress.    Appearance: Normal appearance. She is not ill-appearing, toxic-appearing or diaphoretic.  HENT:     Head: Normocephalic and atraumatic.     Right Ear: External ear normal.     Left Ear: External ear normal.     Nose: Nose normal.     Mouth/Throat:     Mouth: Mucous membranes are moist.     Pharynx: Oropharynx is clear.   Eyes:     General: No scleral icterus.       Right eye: No discharge.        Left  eye: No discharge.     Extraocular Movements: Extraocular movements intact.     Conjunctiva/sclera: Conjunctivae normal.     Pupils: Pupils are equal, round, and reactive to light.    Cardiovascular:     Rate and Rhythm: Normal rate and regular rhythm.     Pulses: Normal pulses.     Heart sounds: Normal heart sounds. No murmur heard.    No friction rub. No gallop.  Pulmonary:     Effort: Pulmonary effort is normal. No respiratory distress.     Breath sounds: Normal breath sounds. No stridor. No wheezing, rhonchi or rales.  Chest:     Chest wall: No tenderness.   Musculoskeletal:        General: Normal range of motion.     Cervical back: Normal range of motion and neck supple.   Skin:    General: Skin is warm and dry.     Capillary Refill: Capillary refill takes less than 2 seconds.     Coloration: Skin is not jaundiced or pale.     Findings: No bruising, erythema, lesion or rash.   Neurological:     General: No focal deficit present.     Mental Status: She is alert and oriented to person, place, and time. Mental status is at baseline.   Psychiatric:        Mood and Affect: Mood normal.        Behavior: Behavior normal.        Thought Content: Thought content normal.        Judgment: Judgment normal.     Results for orders placed or performed in visit on 04/14/23  HM DIABETES EYE EXAM   Collection Time: 06/03/22  9:13 AM  Result Value Ref Range   HM Diabetic Eye Exam No Retinopathy No Retinopathy      Assessment & Plan:   Problem List Items Addressed This Visit       Cardiovascular and Mediastinum   Essential hypertension   Under good control on current regimen. Continue current regimen. Continue to monitor. Call with any concerns. Refills given. Labs drawn today.        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 Tolerating medicine well. Has gained some weight- will increase her mounjaro  to 15mg . Recheck 6 months at physical.      Relevant Medications   losartan  (COZAAR ) 25 MG tablet   tirzepatide  (MOUNJARO ) 15 MG/0.5ML Pen   Other Relevant Orders   Bayer DCA Hb A1c Waived   CBC with  Differential/Platelet   Comprehensive metabolic panel with GFR   Hepatitis B surface antibody,quantitative   Microalbumin, Urine Waived   B12   Hyperlipidemia associated with type 2 diabetes mellitus (HCC)   Unable to tolerate statins. Rechecking labs today. Call with any concerns.       Relevant Medications   losartan  (COZAAR ) 25 MG tablet   tirzepatide  (MOUNJARO ) 15 MG/0.5ML Pen   Other Relevant Orders   Lipid Panel w/o Chol/HDL Ratio   CBC with Differential/Platelet   Comprehensive metabolic panel with GFR     Other   Vitamin D  deficiency   Rechecking labs today. Await results. Call with any concerns.       Relevant Orders   CBC with Differential/Platelet   Comprehensive metabolic panel with GFR   VITAMIN D  25 Hydroxy (Vit-D Deficiency, Fractures)   Recurrent major depressive disorder, in full remission (HCC)   Under good control on current regimen. Continue current regimen. Continue to monitor. Call with any concerns. Refills given.        Relevant Medications   buPROPion  (WELLBUTRIN  SR) 150 MG 12 hr tablet   Other Relevant Orders   CBC with Differential/Platelet   Comprehensive metabolic panel with GFR     Follow up plan: Return in about 6 months (around 04/22/2024) for physical.

## 2023-10-21 NOTE — Assessment & Plan Note (Signed)
 Unable to tolerate statins. Rechecking labs today. Call with any concerns.

## 2023-10-21 NOTE — Assessment & Plan Note (Signed)
 Rechecking labs today. Tolerating medicine well. Has gained some weight- will increase her mounjaro  to 15mg . Recheck 6 months at physical.

## 2023-10-21 NOTE — Assessment & Plan Note (Signed)
 Under good control on current regimen. Continue current regimen. Continue to monitor. Call with any concerns. Refills given.

## 2023-10-21 NOTE — Assessment & Plan Note (Signed)
 Under good control on current regimen. Continue current regimen. Continue to monitor. Call with any concerns. Refills given. Labs drawn today.

## 2023-10-21 NOTE — Assessment & Plan Note (Signed)
Rechecking labs today. Await results. Call with any concerns.  

## 2023-10-22 ENCOUNTER — Ambulatory Visit: Payer: Self-pay | Admitting: Family Medicine

## 2023-10-22 ENCOUNTER — Telehealth: Payer: Self-pay

## 2023-10-22 LAB — CBC WITH DIFFERENTIAL/PLATELET
Basophils Absolute: 0 10*3/uL (ref 0.0–0.2)
Basos: 0 %
EOS (ABSOLUTE): 0.3 10*3/uL (ref 0.0–0.4)
Eos: 5 %
Hematocrit: 39.1 % (ref 34.0–46.6)
Hemoglobin: 12.6 g/dL (ref 11.1–15.9)
Immature Grans (Abs): 0 10*3/uL (ref 0.0–0.1)
Immature Granulocytes: 0 %
Lymphocytes Absolute: 1.8 10*3/uL (ref 0.7–3.1)
Lymphs: 35 %
MCH: 30.7 pg (ref 26.6–33.0)
MCHC: 32.2 g/dL (ref 31.5–35.7)
MCV: 95 fL (ref 79–97)
Monocytes Absolute: 0.5 10*3/uL (ref 0.1–0.9)
Monocytes: 9 %
Neutrophils Absolute: 2.6 10*3/uL (ref 1.4–7.0)
Neutrophils: 51 %
Platelets: 203 10*3/uL (ref 150–450)
RBC: 4.1 x10E6/uL (ref 3.77–5.28)
RDW: 11.8 % (ref 11.7–15.4)
WBC: 5.2 10*3/uL (ref 3.4–10.8)

## 2023-10-22 LAB — LIPID PANEL W/O CHOL/HDL RATIO
Cholesterol, Total: 183 mg/dL (ref 100–199)
HDL: 63 mg/dL (ref >39)
LDL Chol Calc (NIH): 96 mg/dL (ref 0–99)
Triglycerides: 139 mg/dL (ref 0–149)
VLDL Cholesterol Cal: 24 mg/dL (ref 5–40)

## 2023-10-22 LAB — COMPREHENSIVE METABOLIC PANEL WITH GFR
ALT: 16 IU/L (ref 0–32)
AST: 15 IU/L (ref 0–40)
Albumin: 4 g/dL (ref 3.9–4.9)
Alkaline Phosphatase: 85 IU/L (ref 44–121)
BUN/Creatinine Ratio: 25 (ref 12–28)
BUN: 15 mg/dL (ref 8–27)
Bilirubin Total: 0.3 mg/dL (ref 0.0–1.2)
CO2: 21 mmol/L (ref 20–29)
Calcium: 8.9 mg/dL (ref 8.7–10.3)
Chloride: 105 mmol/L (ref 96–106)
Creatinine, Ser: 0.61 mg/dL (ref 0.57–1.00)
Globulin, Total: 1.9 g/dL (ref 1.5–4.5)
Glucose: 109 mg/dL — ABNORMAL HIGH (ref 70–99)
Potassium: 3.9 mmol/L (ref 3.5–5.2)
Sodium: 140 mmol/L (ref 134–144)
Total Protein: 5.9 g/dL — ABNORMAL LOW (ref 6.0–8.5)
eGFR: 97 mL/min/{1.73_m2} (ref >59)

## 2023-10-22 LAB — VITAMIN D 25 HYDROXY (VIT D DEFICIENCY, FRACTURES): Vit D, 25-Hydroxy: 47.4 ng/mL (ref 30.0–100.0)

## 2023-10-22 LAB — HEPATITIS B SURFACE ANTIBODY, QUANTITATIVE: Hepatitis B Surf Ab Quant: <3.5 m[IU]/mL — ABNORMAL LOW

## 2023-10-22 LAB — VITAMIN B12: Vitamin B-12: 1024 pg/mL (ref 232–1245)

## 2023-10-22 NOTE — Telephone Encounter (Signed)
 Labs are normal. Nothing to be concerned about. Not immune to hepatitis B can come in for vaccines.

## 2023-10-22 NOTE — Telephone Encounter (Signed)
 Routing to provider to advise when labs have been resulted.

## 2023-10-22 NOTE — Telephone Encounter (Signed)
 Copied from CRM [id number]. Topic: General - Other >> Oct 22, 2023 12:14 PM Emylou G wrote: Reason for CRM: Pls call patient .SABRA Checking status of labs and results

## 2023-10-28 NOTE — Telephone Encounter (Signed)
 Returned call to patient and labs reviewed. Scheduled for nurse visit for Hep B vaccine.

## 2023-10-29 ENCOUNTER — Ambulatory Visit (INDEPENDENT_AMBULATORY_CARE_PROVIDER_SITE_OTHER)

## 2023-10-29 DIAGNOSIS — Z23 Encounter for immunization: Secondary | ICD-10-CM

## 2023-10-29 NOTE — Progress Notes (Signed)
 Patient is in office today for a nurse visit for Immunization. Patient Injection was given in the  Left deltoid. Patient tolerated injection well.

## 2023-11-03 ENCOUNTER — Ambulatory Visit: Payer: Self-pay

## 2023-11-03 NOTE — Telephone Encounter (Signed)
     FYI Only or Action Required?: Action required by provider: would like medication called in for diarrhea.  Patient was last seen in primary care on 10/21/2023 by Vicci Duwaine SQUIBB, DO.  Called Nurse Triage reporting Diarrhea.  Symptoms began yesterday.  Interventions attempted: Nothing.  Symptoms are: unchanged.  Triage Disposition: No disposition on file.  Patient/caregiver understands and will follow disposition?: Copied from CRM [id number]. Topic: Clinical - Red Word Triage >> Nov 03, 2023 11:48 AM Tiffini S wrote: [facility] that prompted transfer to Nurse Triage: Patient called stating she is having severe diarrhea, patient have tried over the counter medications that are not helping. Please to triage nurse. Reason for Disposition  Diarrhea is a chronic symptom (recurrent or ongoing AND present > 4 weeks)  Answer Assessment - Initial Assessment Questions 1. DIARRHEA SEVERITY: How bad is the diarrhea? How many more stools have you had in the past 24 hours than normal?      More than 10 2. ONSET: When did the diarrhea begin?      Sunday 3. STOOL DESCRIPTION:  How loose or watery is the diarrhea? What is the stool color? Is there any blood or mucous in the stool?     loose 4. VOMITING: Are you also vomiting? If Yes, ask: How many times in the past 24 hours?      no 5. ABDOMEN PAIN: Are you having any abdomen pain? If Yes, ask: What does it feel like? (e.g., crampy, dull, intermittent, constant)      no 6. ABDOMEN PAIN SEVERITY: If present, ask: How bad is the pain?  (e.g., Scale 1-10; mild, moderate, or severe)     no 7. ORAL INTAKE: If vomiting, Have you been able to drink liquids? How much liquids have you had in the past 24 hours?     yes 8. HYDRATION: Any signs of dehydration? (e.g., dry mouth [not just dry lips], too weak to stand, dizziness, new weight loss) When did you last urinate?     no 9. EXPOSURE: Have you traveled to a foreign country  recently? Have you been exposed to anyone with diarrhea? Could you have eaten any food that was spoiled?     no 10. ANTIBIOTIC USE: Are you taking antibiotics now or have you taken antibiotics in the past 2 months?       no 11. OTHER SYMPTOMS: Do you have any other symptoms? (e.g., fever, blood in stool)       no 12. PREGNANCY: Is there any chance you are pregnant? When was your last menstrual period?       na  Protocols used: [facility]

## 2023-11-04 NOTE — Telephone Encounter (Unsigned)
 Copied from CRM [id number]. Topic: Clinical - Medication Question >> Nov 04, 2023  8:11 AM Myrick T wrote: Reason for CRM: patient called back again stating she spoke with a nurse yesterday morning and was told she would get something called in before the end of the day for her diarrhea and now she is vomiting also. Please send in something for the diarrhea as patient says she can't get it under control.

## 2023-11-04 NOTE — Telephone Encounter (Signed)
 Scheduled for 7/29 but patient states that she was just here on 10/21/23 and the medication was offered to her at that time, so she does not understand the need for another appt.

## 2023-11-04 NOTE — Telephone Encounter (Signed)
 Appt

## 2023-11-05 ENCOUNTER — Encounter: Payer: Self-pay | Admitting: Family Medicine

## 2023-11-05 ENCOUNTER — Telehealth (INDEPENDENT_AMBULATORY_CARE_PROVIDER_SITE_OTHER): Admitting: Family Medicine

## 2023-11-05 DIAGNOSIS — K529 Noninfective gastroenteritis and colitis, unspecified: Secondary | ICD-10-CM

## 2023-11-05 NOTE — Telephone Encounter (Signed)
 Her symptoms began yesterday- I'm not sure how she would have discussed it on 7/1- also 7/29 is too late for this type of acute- can we please get her scheduled tomorrow or Friday? Virtual/Overbook with another virtual is fine

## 2023-11-05 NOTE — Progress Notes (Signed)
 There were no vitals taken for this visit.   Subjective:    Patient ID: [name], female    DOB: 13-Jun-1955, 68 y.o.   MRN: [id number]  HPI: [name] is a 68 y.o. female  Chief Complaint  Patient presents with   Diarrhea    Pt states she has had it since Sunday   GASTROENTERITIS Duration: 4 days Diarrhea: yes non-bloody  Episodes of diarrhea/day: 8+  Nausea: yes Vomiting: yes Episodes of vomit/day: 1x yesterday Abdominal pain: some cramping Fever: yes- Monday night (100) Decreased appetite: yes Tolerating liquids: yes Foreign travel: no Relevant dietary history: N/A Similar illness in contacts: yes- another person got sick at the cook out on Sunday Recent antibiotic use: no Status: better Treatments attempted: fluids and hycosamine  Relevant past medical, surgical, family and social history reviewed and updated as indicated. Interim medical history since our last visit reviewed. Allergies and medications reviewed and updated.  Review of Systems  Constitutional: Negative.   Respiratory: Negative.    Cardiovascular: Negative.   Gastrointestinal:  Positive for diarrhea and nausea. Negative for abdominal distention, abdominal pain, anal bleeding, blood in stool, constipation, rectal pain and vomiting.  Psychiatric/Behavioral: Negative.      Per HPI unless specifically indicated above     Objective:    There were no vitals taken for this visit.  Wt Readings from Last 3 Encounters:  10/21/23 181 lb 6.4 oz (82.3 kg)  06/06/23 175 lb (79.4 kg)  04/21/23 176 lb 3.2 oz (79.9 kg)    Physical Exam Vitals and nursing note reviewed.  Constitutional:      General: She is not in acute distress.    Appearance: Normal appearance. She is not ill-appearing, toxic-appearing or diaphoretic.  HENT:     Head: Normocephalic and atraumatic.     Right Ear: External ear normal.     Left Ear: External ear normal.     Nose: Nose normal.     Mouth/Throat:      Mouth: Mucous membranes are moist.     Pharynx: Oropharynx is clear.  Eyes:     General: No scleral icterus.       Right eye: No discharge.        Left eye: No discharge.     Conjunctiva/sclera: Conjunctivae normal.     Pupils: Pupils are equal, round, and reactive to light.  Pulmonary:     Effort: Pulmonary effort is normal. No respiratory distress.     Comments: Speaking in full sentences Musculoskeletal:        General: Normal range of motion.     Cervical back: Normal range of motion.  Skin:    Coloration: Skin is not jaundiced or pale.     Findings: No bruising, erythema, lesion or rash.  Neurological:     Mental Status: She is alert and oriented to person, place, and time. Mental status is at baseline.  Psychiatric:        Mood and Affect: Mood normal.        Behavior: Behavior normal.        Thought Content: Thought content normal.        Judgment: Judgment normal.     Results for orders placed or performed in visit on 10/21/23  Bayer DCA Hb A1c Waived   Collection Time: 10/21/23  8:55 AM  Result Value Ref Range   HB A1C (BAYER DCA - WAIVED) 5.5 4.8 - 5.6 %  Microalbumin, Urine Waived   Collection  Time: 10/21/23  8:55 AM  Result Value Ref Range   Microalb, Ur Waived 30 (H) 0 - 19 mg/L   Creatinine, Urine Waived 200 10 - 300 mg/dL   Microalb/Creat Ratio <30 <30 mg/g  Lipid Panel w/o Chol/HDL Ratio   Collection Time: 10/21/23  8:57 AM  Result Value Ref Range   Cholesterol, Total 183 100 - 199 mg/dL   Triglycerides 860 0 - 149 mg/dL   HDL 63 >60 mg/dL   VLDL Cholesterol Cal 24 5 - 40 mg/dL   LDL Chol Calc (NIH) 96 0 - 99 mg/dL  CBC with Differential/Platelet   Collection Time: 10/21/23  8:57 AM  Result Value Ref Range   WBC 5.2 3.4 - 10.8 x10E3/uL   RBC 4.10 3.77 - 5.28 x10E6/uL   Hemoglobin 12.6 11.1 - 15.9 g/dL   Hematocrit 60.8 65.9 - 46.6 %   MCV 95 79 - 97 fL   MCH 30.7 26.6 - 33.0 pg   MCHC 32.2 31.5 - 35.7 g/dL   RDW 88.1 88.2 - 84.5 %   Platelets  203 150 - 450 x10E3/uL   Neutrophils 51 Not Estab. %   Lymphs 35 Not Estab. %   Monocytes 9 Not Estab. %   Eos 5 Not Estab. %   Basos 0 Not Estab. %   Neutrophils Absolute 2.6 1.4 - 7.0 x10E3/uL   Lymphocytes Absolute 1.8 0.7 - 3.1 x10E3/uL   Monocytes Absolute 0.5 0.1 - 0.9 x10E3/uL   EOS (ABSOLUTE) 0.3 0.0 - 0.4 x10E3/uL   Basophils Absolute 0.0 0.0 - 0.2 x10E3/uL   Immature Granulocytes 0 Not Estab. %   Immature Grans (Abs) 0.0 0.0 - 0.1 x10E3/uL  Comprehensive metabolic panel with GFR   Collection Time: 10/21/23  8:57 AM  Result Value Ref Range   Glucose 109 (H) 70 - 99 mg/dL   BUN 15 8 - 27 mg/dL   Creatinine, Ser 9.38 0.57 - 1.00 mg/dL   eGFR 97 >40 fO/fpw/8.26   BUN/Creatinine Ratio 25 12 - 28   Sodium 140 134 - 144 mmol/L   Potassium 3.9 3.5 - 5.2 mmol/L   Chloride 105 96 - 106 mmol/L   CO2 21 20 - 29 mmol/L   Calcium  8.9 8.7 - 10.3 mg/dL   Total Protein 5.9 (L) 6.0 - 8.5 g/dL   Albumin 4.0 3.9 - 4.9 g/dL   Globulin, Total 1.9 1.5 - 4.5 g/dL   Bilirubin Total 0.3 0.0 - 1.2 mg/dL   Alkaline Phosphatase 85 44 - 121 IU/L   AST 15 0 - 40 IU/L   ALT 16 0 - 32 IU/L  Hepatitis B surface antibody,quantitative   Collection Time: 10/21/23  8:57 AM  Result Value Ref Range   Hepatitis B Surf Ab Quant <3.5 (L) Immunity>10 mIU/mL  B12   Collection Time: 10/21/23  8:57 AM  Result Value Ref Range   Vitamin B-12 1,024 232 - 1,245 pg/mL  VITAMIN D  25 Hydroxy (Vit-D Deficiency, Fractures)   Collection Time: 10/21/23  8:57 AM  Result Value Ref Range   Vit D, 25-Hydroxy 47.4 30.0 - 100.0 ng/mL      Assessment & Plan:   Problem List Items Addressed This Visit   None Visit Diagnoses       Gastroenteritis    -  Primary   Improving. Likey food poisoning given similar illness in contacts. Call if getting worse again and we'll check labs. Continue to monitor.        Follow up plan:  Return for As scheduled.   This visit was completed via video visit through MyChart due to  the restrictions of the COVID-19 pandemic. All issues as above were discussed and addressed. Physical exam was done as above through visual confirmation on video through MyChart. If it was felt that the patient should be evaluated in the office, they were directed there. The patient verbally consented to this visit. Location of the patient: home Location of the provider: work Those involved with this call:  Provider: Duwaine Louder, DO CMA: Cena Maffucci, CMA Front Desk/Registration: Claretta Maiden  Time spent on call: 15 minutes with patient face to face via video conference. More than 50% of this time was spent in counseling and coordination of care. 23 minutes total spent in review of patient's record and preparation of their chart.

## 2023-11-18 ENCOUNTER — Ambulatory Visit: Admitting: Family Medicine

## 2023-12-25 ENCOUNTER — Ambulatory Visit: Payer: Self-pay

## 2023-12-25 ENCOUNTER — Encounter (HOSPITAL_COMMUNITY): Payer: Self-pay | Admitting: Emergency Medicine

## 2023-12-25 ENCOUNTER — Ambulatory Visit (HOSPITAL_COMMUNITY): Admission: EM | Admit: 2023-12-25 | Discharge: 2023-12-25 | Disposition: A | Discharge status: Home / Self Care

## 2023-12-25 DIAGNOSIS — H7291 Unspecified perforation of tympanic membrane, right ear: Secondary | ICD-10-CM

## 2023-12-25 DIAGNOSIS — H9011 Conductive hearing loss, unilateral, right ear, with unrestricted hearing on the contralateral side: Secondary | ICD-10-CM | POA: Diagnosis not present

## 2023-12-25 MED ORDER — CEFDINIR 300 MG PO CAPS: 300.0000 mg | ORAL_CAPSULE | Freq: Two times a day (BID) | ORAL | 0 refills | Status: DC

## 2023-12-25 MED ORDER — CIPROFLOXACIN-DEXAMETHASONE 0.3-0.1 % OT SUSP: 4.0000 [drp] | Freq: Two times a day (BID) | OTIC | 0 refills | Status: AC

## 2023-12-25 NOTE — Telephone Encounter (Signed)
 FYI Only or Action Required?: FYI only for provider.  Patient was last seen in primary care on 11/05/2023 by Vicci Duwaine SQUIBB, DO.  Called Nurse Triage reporting Ear Injury.  Symptoms began yesterday.  Interventions attempted: Nothing.  Symptoms are: unchanged.  Triage Disposition: See Physician Within 24 Hours  Patient/caregiver understands and will follow disposition?: Yes    Copied from CRM [id number]. Topic: Clinical - Red Word Triage >> Dec 25, 2023  8:58 AM Willma R wrote: [facility] that prompted transfer to Nurse Triage: Patient woke up this morning bleeding out of her right ear and she is unable to hear. Reason for Disposition  Hearing is decreased in injured ear  Answer Assessment - Initial Assessment Questions 1. MECHANISM: How did the injury happen?      In the middle of the night, unsure what happened, states bleeding stopped and now she can't hear out of right ear. 2. ONSET: When did the injury happen? (e.g., minutes, hours ago)      Last night 3. LOCATION: What part of the ear is injured? Which ear is injured?     Right ear 4. APPEARANCE: What does the ear look like?      normal 5. HEARING: Was the hearing damaged?      yes 6. SIZE: For cuts, bruises, or swelling, ask: How large is it? (e.g., inches or centimeters)     no 7. PAIN: Is it painful? If Yes, ask: How bad is the pain? (e.g., Scale 0-10; none, mild, moderate, severe)     Pain, moderate and ringing, like a dull buzz 8. TETANUS: For any breaks in the skin, ask: When was your last tetanus booster?     no 9. OTHER SYMPTOMS: Do you have any other symptoms? (e.g., neck pain, headache, loss of consciousness)     headache 10. PREGNANCY: Is there any chance you are pregnant? When was your last menstrual period?       na  Protocols used: Ear Injury-A-AH

## 2023-12-25 NOTE — ED Provider Notes (Signed)
 [facility]   CSN: [id number] Arrival date & time: 12/25/23  9062     History   Chief Complaint Chief Complaint  Patient presents with   Ear Drainage   Headache    HPI [name] is a 68 y.o. female.   Patient presents today with complaints of right sided ear drainage and headache.  Patient states that suddenly at 2:30 AM last night she woke up with the right side of her face feeling warm.  She states that she had a large pool of blood on her pillow.  She stated that she had vertigo a couple weeks ago, but this completely resolved.  She denied any previous ear pressure, fevers, drainage.  She states that she has complete hearing loss on her right side, as well as some ringing.  She denies any previous complications with this ear surgeries.   Ear Drainage Associated symptoms include headaches.  Headache   Past Medical History:  Diagnosis Date   Asthma    Depression    Diabetes mellitus without complication (HCC)    Hyperlipidemia    Kidney stones    Migraine headache     Patient Active Problem List   Diagnosis Date Noted   Coronary artery disease with angina pectoris, unspecified vessel or lesion type, unspecified whether native or transplanted heart (HCC) 10/17/2022   Statin myopathy 10/16/2021   Obesity (BMI 35.0-39.9 without comorbidity) 06/13/2021   COPD (chronic obstructive pulmonary disease) (HCC) 01/19/2021   Gastroesophageal reflux disease without esophagitis 01/19/2021   Absence of bladder continence 11/30/2020   Recurrent major depressive disorder, in full remission (HCC) 11/30/2020   Chronic venous insufficiency 10/05/2020   Pain and swelling of lower leg 09/18/2020   Raynauds phenomenon 09/18/2020   History of total knee arthroplasty 09/08/2019   Vitamin D  deficiency 12/03/2018   History of arthroscopy of knee 09/10/2018   Knee arthropathy 07/28/2018   Anxiety 04/07/2018   Hyperlipidemia associated with type 2 diabetes mellitus (HCC)  04/07/2018   Mixed stress and urge urinary incontinence 02/25/2017   Right sided weakness 09/26/2016   Asthma 11/15/2015   Essential hypertension 10/20/2014   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      Home Medications    Prior to Admission medications   Medication Sig Start Date End Date Taking? Authorizing Provider  cefdinir  (OMNICEF ) 300 MG capsule Take 1 capsule (300 mg total) by mouth 2 (two) times daily. 12/25/23  Yes Gergory Biello, Krystal HERO, DO  ciprofloxacin -dexamethasone  (CIPRODEX ) OTIC suspension Place 4 drops into the right ear 2 (two) times daily for 7 days. 12/25/23 01/01/24 Yes Brigetta Beckstrom, Krystal HERO, DO  albuterol  (PROAIR  HFA) 108 (90 Base) MCG/ACT inhaler Inhale 2 puffs into the lungs every 6 (six) hours as needed for wheezing or shortness of breath. 10/21/23   Vicci Duwaine SQUIBB,  DO  albuterol  (PROVENTIL ) (2.5 MG/3ML) 0.083% nebulizer solution USE 1 VIAL VIA NEBULIZER 1 TO 2 TIMES A DAY AS DIRECTED 10/17/22   Johnson, Megan P, DO  budesonide -glycopyrrolate-formoterol  (BREZTRI  AEROSPHERE) 160-9-4.8 MCG/ACT AERO inhaler Inhale 2 puffs into the lungs 2 (two) times daily. 10/21/23   Johnson, Megan P, DO  buPROPion  (WELLBUTRIN  SR) 150 MG 12 hr tablet Take 1 tablet (150 mg total) by mouth 3 (three) times daily. 10/21/23   Johnson, Megan P, DO  CELEBREX 200 MG capsule Take 1 capsule  every day by oral route with meal(s). 03/31/23   [provider]  diphenoxylate -atropine  (LOMOTIL ) 2.5-0.025 MG tablet     [provider]  fluticasone  (FLONASE ) 50 MCG/ACT nasal spray Place 2 sprays into both nostrils 2 (two) times daily. 10/21/23   Johnson, Megan P, DO  hyoscyamine  (LEVSIN  SL) 0.125 MG SL tablet TAKE ONE TABLET BY MOUTH EVERY 4 HOURS AS NEEDED 10/21/23   Vicci, Megan P, DO  losartan  (COZAAR ) 25 MG tablet Take 0.5 tablets (12.5 mg total) by mouth daily. 10/21/23   Johnson, Megan P, DO  montelukast  (SINGULAIR ) 10 MG tablet Take 1 tablet (10 mg total) by mouth at bedtime. 10/21/23   Johnson, Megan P, DO  ondansetron  (ZOFRAN ) 8 MG tablet Take 1 tablet (8 mg total) by mouth every 8 (eight) hours as needed for nausea or vomiting. 10/21/23   Johnson, Megan P, DO  tirzepatide  (MOUNJARO ) 15 MG/0.5ML Pen Inject 15 mg into the skin once a week. 10/21/23   Johnson, Megan P, DO  Vibegron (GEMTESA) 75 MG TABS Take 1 tablet by mouth daily.    [provider]    Family History Family History  Problem Relation Age of Onset   Hypertension Mother    Cancer Mother        breast   Arthritis Mother    Alzheimer's disease Mother    Parkinson's disease Mother    Breast cancer Mother 76   Hypertension Father    Diabetes Father    Kidney cancer Father    Breast cancer Cousin 61   Breast cancer Maternal Aunt    Breast cancer Maternal Grandmother 79    Social History Social History   Tobacco Use   Smoking status: Never   Smokeless tobacco: Never  Vaping Use   Vaping status: Never Used  Substance Use Topics   Alcohol use: Yes    Comment: social   Drug use: No     Allergies   Cymbalta  [duloxetine  hcl], Amoxicillin, and Atorvastatin    Review of Systems Review of Systems  Neurological:  Positive for headaches.  All other systems reviewed and are negative.    Physical Exam Triage Vital Signs ED Triage Vitals  Encounter Vitals Group     BP 12/25/23 0952 (!)  143/75     Girls Systolic BP Percentile --      Girls Diastolic BP Percentile --      Boys Systolic BP Percentile --      Boys Diastolic BP Percentile --      Pulse Rate 12/25/23 0952 77     Resp 12/25/23 0952 18     Temp 12/25/23 0952 98.1 F (36.7 C)     Temp Source 12/25/23 0952 Oral     SpO2 12/25/23 0952 96 %     Weight --      Height --      Head Circumference --      Peak Flow --  Pain Score 12/25/23 0951 6     Pain Loc --      Pain Education --      Exclude from Growth Chart --    No data found.  Updated Vital Signs BP (!) 143/75 (BP Location: Left Arm)   Pulse 77   Temp 98.1 F (36.7 C) (Oral)   Resp 18   SpO2 96%   Visual Acuity Right Eye Distance:   Left Eye Distance:   Bilateral Distance:    Right Eye Near:   Left Eye Near:    Bilateral Near:     Physical Exam Constitutional:      Appearance: She is well-developed.  HENT:     Head: Normocephalic and atraumatic.     Comments: Left tympanic membrane is pearly gray. Right tympanic membrane is absent.  Inner ear bones visualized.  Significant of clotted blood present in the inner ear.  Whisper test abnormal on the right.  Weber test localizes to the affected ear. Eyes:     Extraocular Movements: Extraocular movements intact.     Pupils: Pupils are equal, round, and reactive to light.  Cardiovascular:     Rate and Rhythm: Normal rate.  Pulmonary:     Effort: Pulmonary effort is normal.  Musculoskeletal:     Cervical back: Normal range of motion and neck supple.  Neurological:     Mental Status: She is alert.    UC Treatments / Results  Labs (all labs ordered are listed, but only abnormal results are displayed) Labs Reviewed - No data to display  EKG   Radiology No results found.  Procedures Procedures (including critical care time)  Medications Ordered in UC Medications - No data to display  Initial Impression / Assessment and Plan / UC Course  I have reviewed the triage vital  signs and the nursing notes.  Pertinent labs & imaging results that were available during my care of the patient were reviewed by me and considered in my medical decision making (see chart for details).     Patient exam is significant for tympanic membrane rupture with hearing loss.  Weber test done in the office does show conductive hearing loss.  The affected right ear was localized. - Called discussed case with Dr. Roark who is on-call for ENT - Recommended starting Ciprodex  as well as an oral antibiotic and to have her follow-up in his office this week or the medial next week - Patient given instructions as well as antibiotic instructions - Of note, she does have hives reaction to amoxicillin.  She was placed on cefdinir .  Discussed this with patient as well as if there are any complications, to call and let us  know.  Final Clinical Impressions(s) / UC Diagnoses   Final diagnoses:  Ruptured tympanic membrane, right  Conductive hearing loss of right ear with unrestricted hearing of left ear     Discharge Instructions      You have a ruptured tympanic membrane on the right Start taking oral antibiotics and the antibiotic drops Referrals been placed to Dr. Roark ENT Call and schedule an appointment ASAP If there are changes, increased redness, new fevers, new symptoms with your ear, go to emergency department right away.     ED Prescriptions     Medication Sig Dispense Auth. Provider   ciprofloxacin -dexamethasone  (CIPRODEX ) OTIC suspension Place 4 drops into the right ear 2 (two) times daily for 7 days. 2.8 mL Mala Gibbard M, DO   cefdinir  (OMNICEF ) 300 MG capsule  Take 1 capsule (300 mg total) by mouth 2 (two) times daily. 14 capsule Jilda Krystal HERO, DO      PDMP not reviewed this encounter.   Jilda Krystal HERO, DO 12/25/23 1038

## 2023-12-25 NOTE — Discharge Instructions (Signed)
 You have a ruptured tympanic membrane on the right Start taking oral antibiotics and the antibiotic drops Referrals been placed to Dr. Roark ENT Call and schedule an appointment ASAP If there are changes, increased redness, new fevers, new symptoms with your ear, go to emergency department right away.

## 2023-12-25 NOTE — ED Triage Notes (Signed)
 Pt reports woke up with bleeding coming from right ear during the night. Today has headache that is generalized. Denies taking any mediations for pain.

## 2023-12-26 ENCOUNTER — Ambulatory Visit: Admitting: Pediatrics

## 2023-12-29 ENCOUNTER — Ambulatory Visit (INDEPENDENT_AMBULATORY_CARE_PROVIDER_SITE_OTHER): Admitting: Physician Assistant

## 2023-12-29 ENCOUNTER — Encounter (INDEPENDENT_AMBULATORY_CARE_PROVIDER_SITE_OTHER): Payer: Self-pay | Admitting: Physician Assistant

## 2023-12-29 VITALS — BP 159/81 | HR 75 | Ht 60.0 in | Wt 169.0 lb

## 2023-12-29 DIAGNOSIS — H9191 Unspecified hearing loss, right ear: Secondary | ICD-10-CM | POA: Diagnosis not present

## 2023-12-29 DIAGNOSIS — H7291 Unspecified perforation of tympanic membrane, right ear: Secondary | ICD-10-CM

## 2023-12-29 MED ORDER — CIPROFLOXACIN-DEXAMETHASONE 0.3-0.1 % OT SUSP: 4.0000 [drp] | Freq: Two times a day (BID) | OTIC | 0 refills | Status: DC

## 2023-12-29 NOTE — Progress Notes (Signed)
 Dear Dr. Vicci, Here is my assessment for our mutual patient, [name]. Thank you for allowing me the opportunity to care for your patient. Please do not hesitate to contact me should you have any other questions. Sincerely, Chyrl Cohen PA-C  Otolaryngology Clinic Note Referring provider: Dr. Vicci HPI:  [name] is a 68 y.o. female kindly referred by Dr. Vicci   The patient is a 68 year old female seen in our office for evaluation of ruptured right tympanic membrane.  The patient notes that approximately 4 days ago she was laying in bed, she felt warm fluid out of her right ear, she got up and there was a large amount of blood coming from her right ear.  She notes she went to urgent care who noted a very large tympanic membrane rupture.  On-call ENT was notified, the patient was placed on oral antibiotics and otic drops.  She notes she has been using it since that time.  She notes since that time she has had rather severe hearing loss on the right.  She denies any preceding upper respiratory infections, no pain or any ear related symptoms prior to this.  She denies any use of Q-tips, no trauma to the ear.  Uncertain etiology of why this happened.  She denies any history recurrent ear infections.  No history of the same.   Independent Review of Additional Tests or Records:  Urgent care office visit 12/25/2023   PMH/Meds/All/SocHx/FamHx/ROS:   Past Medical History:  Diagnosis Date   Asthma    Depression    Diabetes mellitus without complication (HCC)    Hyperlipidemia    Kidney stones    Migraine headache      Past Surgical History:  Procedure Laterality Date   ABDOMINAL HYSTERECTOMY     BREAST CYST ASPIRATION Right 1981   benign   BREAST CYST ASPIRATION Right 1984   benign   CHOLECYSTECTOMY     COLONOSCOPY  March 2016   FLEXIBLE BRONCHOSCOPY N/A 01/26/2018   Procedure: FLEXIBLE BRONCHOSCOPY;  Surgeon: Verdia Art, MD;  Location: [facility] ORS;  Service:  Pulmonary;  Laterality: N/A;   MENISCUS REPAIR Left 09/18/2018   OOPHORECTOMY     PARTIAL KNEE ARTHROPLASTY Left 02/2019    Family History  Problem Relation Age of Onset   Hypertension Mother    Cancer Mother        breast   Arthritis Mother    Alzheimer's disease Mother    Parkinson's disease Mother    Breast cancer Mother 9   Hypertension Father    Diabetes Father    Kidney cancer Father    Breast cancer Cousin 79   Breast cancer Maternal Aunt    Breast cancer Maternal Grandmother 38     Social Connections: Moderately Integrated (10/13/2022)   Social Connection and Isolation Panel    Frequency of Communication with Friends and Family: More than three times a week    Frequency of Social Gatherings with Friends and Family: More than three times a week    Attends Religious Services: More than 4 times per year    Active Member of [employer] or Organizations: Yes    Attends Banker Meetings: 1 to 4 times per year    Marital Status: Divorced      Current Outpatient Medications:    albuterol  (PROAIR  HFA) 108 (90 Base) MCG/ACT inhaler, Inhale 2 puffs into the lungs every 6 (six) hours as needed for wheezing or shortness of breath., Disp: 8.5 g, Rfl: 6  albuterol  (PROVENTIL ) (2.5 MG/3ML) 0.083% nebulizer solution, USE 1 VIAL VIA NEBULIZER 1 TO 2 TIMES A DAY AS DIRECTED, Disp: 150 mL, Rfl: 1   budesonide -glycopyrrolate-formoterol  (BREZTRI  AEROSPHERE) 160-9-4.8 MCG/ACT AERO inhaler, Inhale 2 puffs into the lungs 2 (two) times daily., Disp: 10.7 g, Rfl: 11   buPROPion  (WELLBUTRIN  SR) 150 MG 12 hr tablet, Take 1 tablet (150 mg total) by mouth 3 (three) times daily., Disp: 270 tablet, Rfl: 1   cefdinir  (OMNICEF ) 300 MG capsule, Take 1 capsule (300 mg total) by mouth 2 (two) times daily., Disp: 14 capsule, Rfl: 0   CELEBREX 200 MG capsule, Take 1 capsule every day by oral route with meal(s)., Disp: , Rfl:    ciprofloxacin -dexamethasone  (CIPRODEX ) OTIC suspension, Place 4 drops  into the right ear 2 (two) times daily for 7 days., Disp: 2.8 mL, Rfl: 0   diphenoxylate -atropine  (LOMOTIL ) 2.5-0.025 MG tablet, , Disp: , Rfl:    fluticasone  (FLONASE ) 50 MCG/ACT nasal spray, Place 2 sprays into both nostrils 2 (two) times daily., Disp: 54 mL, Rfl: 4   hyoscyamine  (LEVSIN  SL) 0.125 MG SL tablet, TAKE ONE TABLET BY MOUTH EVERY 4 HOURS AS NEEDED, Disp: 540 tablet, Rfl: 1   losartan  (COZAAR ) 25 MG tablet, Take 0.5 tablets (12.5 mg total) by mouth daily., Disp: 45 tablet, Rfl: 1   montelukast  (SINGULAIR ) 10 MG tablet, Take 1 tablet (10 mg total) by mouth at bedtime., Disp: 90 tablet, Rfl: 1   ondansetron  (ZOFRAN ) 8 MG tablet, Take 1 tablet (8 mg total) by mouth every 8 (eight) hours as needed for nausea or vomiting., Disp: 30 tablet, Rfl: 0   tirzepatide  (MOUNJARO ) 15 MG/0.5ML Pen, Inject 15 mg into the skin once a week., Disp: 6 mL, Rfl: 1   Vibegron (GEMTESA) 75 MG TABS, Take 1 tablet by mouth daily., Disp: , Rfl:    Physical Exam:   BP (!) 159/81 Comment: first attempt 159/81 second attempt 149/53  Pulse 75   Ht 5' (1.524 m)   Wt 169 lb (76.7 kg)   SpO2 98%   BMI 33.01 kg/m   Pertinent Findings  CN II-XII intact Right external auditory canal with a large amount of dried blood unable to visualize tympanic membrane, left EAC clear, TM intact with pneumatized middle ear space Weber 512: equal Rinne 512: AC > BC b/l  Anterior rhinoscopy: Septum midline; bilateral inferior turbinates with no hypertrophy No lesions of oral cavity/oropharynx; dentition within normal limits No obvious neck masses/lymphadenopathy/thyromegaly No respiratory distress or stridor  Seprately Identifiable Procedures:  None  Impression & Plans:  [name] is a 68 y.o. female with the following   Suspected right tympanic membrane rupture-  68 year old female seen in our office for evaluation of right tympanic membrane rupture.  On my exam today she has a large volume of very hard blood  within the right external auditory canal, and unable to visualize the TM.  I was unable to remove any of the dried blood given how hard it was.  I would recommend continue using the otic drops and following up in 4 days for repeat evaluation.  Patient notes significant hearing loss on the right which would be expected with the amount of blood within the canal.  She denied any sort of symptoms prior to this, no trauma, unclear etiology of the ruptured ear.  Given the amount of blood the reported decreased hearing and unclear etiology I do think it is reasonable to obtain CT temporal bone to assess the bony anatomy.  She denies any drainage from the ear, interestingly her Weber and Renee tests were normal, this cannot be used as a good indicator.  I will see her back in the office in 4 days, sooner as needed.  The patient verbalized understanding and agreement to today's plan.   - f/u 4 days in office   Thank you for allowing me the opportunity to care for your patient. Please do not hesitate to contact me should you have any other questions.  Sincerely, Chyrl Cohen PA-C [facility] Phone: [phone] Fax: [phone]  12/29/2023, 2:10 PM

## 2023-12-29 NOTE — Patient Instructions (Signed)
 I have ordered an imaging study for you to complete prior to your next visit. Please call [facility] Scheduling at [phone] to schedule your imaging if you have not received a call within 24 hours. If you are unable to complete your imaging study prior to your next scheduled visit please call our office to let us  know.

## 2023-12-30 ENCOUNTER — Ambulatory Visit
Admission: RE | Admit: 2023-12-30 | Discharge: 2023-12-30 | Disposition: A | Discharge status: Home / Self Care | Source: Ambulatory Visit | Attending: Physician Assistant | Admitting: Physician Assistant

## 2023-12-30 DIAGNOSIS — H7291 Unspecified perforation of tympanic membrane, right ear: Secondary | ICD-10-CM | POA: Diagnosis present

## 2023-12-30 DIAGNOSIS — H902 Conductive hearing loss, unspecified: Secondary | ICD-10-CM | POA: Diagnosis not present

## 2024-01-01 ENCOUNTER — Ambulatory Visit (INDEPENDENT_AMBULATORY_CARE_PROVIDER_SITE_OTHER): Admitting: Physician Assistant

## 2024-01-01 ENCOUNTER — Telehealth (INDEPENDENT_AMBULATORY_CARE_PROVIDER_SITE_OTHER): Payer: Self-pay | Admitting: Physician Assistant

## 2024-01-01 ENCOUNTER — Encounter (INDEPENDENT_AMBULATORY_CARE_PROVIDER_SITE_OTHER): Payer: Self-pay | Admitting: Physician Assistant

## 2024-01-01 VITALS — BP 152/77 | HR 72

## 2024-01-01 DIAGNOSIS — H9191 Unspecified hearing loss, right ear: Secondary | ICD-10-CM | POA: Diagnosis not present

## 2024-01-01 DIAGNOSIS — H9192 Unspecified hearing loss, left ear: Secondary | ICD-10-CM

## 2024-01-01 NOTE — Progress Notes (Signed)
 Dear [name], Here is my assessment for our mutual [name], [name]. Thank you for allowing me Julia opportunity to care for your [name]. Please do not hesitate to contact me should you have any other questions. Sincerely, Julia Cohen PA-C  Otolaryngology Clinic Note Referring provider: Dr. Vicci HPI:  [name] is a 68 y.o. female kindly referred by [name]   [name] is a 68 year old female seen in our office for follow-up evaluation of of right sided hearing loss.  [name] was last seen in Julia office on 12/29/2023.  Below is a recap of that encounter.  [name] is a 68 year old female seen in our office for evaluation of ruptured right tympanic membrane.  [name] notes that approximately 4 days ago she was laying in bed, she felt warm fluid out of her right ear, she got up and there was a large amount of blood coming from her right ear.  She notes she went to urgent care who noted a very large tympanic membrane rupture.  On-call ENT was notified, [name] was placed on oral antibiotics and otic drops.  She notes she has been using it since that time.  She notes since that time she has had rather severe hearing loss on Julia right.  She denies any preceding upper respiratory infections, no pain or any ear related symptoms prior to this.  She denies any use of Q-tips, no trauma to Julia ear.  Uncertain etiology of why this happened.  She denies any history recurrent ear infections.  No history of Julia same.   Update 01/01/2024  She notes since her last office visit she denies any significant changes, she notes decreased hearing on Julia right.  She has continued to use Julia otic drops as recommended.  No infectious signs or symptoms.   Independent Review of Additional Tests or Records:   None  PMH/Meds/All/SocHx/FamHx/ROS:   Past Medical History:  Diagnosis Date   Asthma    Depression    Diabetes mellitus without complication (HCC)    Hyperlipidemia     Kidney stones    Migraine headache      Past Surgical History:  Procedure Laterality Date   ABDOMINAL HYSTERECTOMY     BREAST CYST ASPIRATION Right 1981   benign   BREAST CYST ASPIRATION Right 1984   benign   CHOLECYSTECTOMY     COLONOSCOPY  March 2016   FLEXIBLE BRONCHOSCOPY N/A 01/26/2018   Procedure: FLEXIBLE BRONCHOSCOPY;  Surgeon: Julia Art, MD;  Location: [facility] ORS;  Service: Pulmonary;  Laterality: N/A;   MENISCUS REPAIR Left 09/18/2018   OOPHORECTOMY     PARTIAL KNEE ARTHROPLASTY Left 02/2019    Family History  Problem Relation Age of Onset   Hypertension Mother    Cancer Mother        breast   Arthritis Mother    Alzheimer's disease Mother    Parkinson's disease Mother    Breast cancer Mother 61   Hypertension Father    Diabetes Father    Kidney cancer Father    Breast cancer Cousin 73   Breast cancer Maternal Aunt    Breast cancer Maternal Grandmother 38     Social Connections: Moderately Integrated (10/13/2022)   Social Connection and Isolation Panel    Frequency of Communication with Friends and Family: More than three times a week    Frequency of Social Gatherings with Friends and Family: More than three times a week    Attends Religious Services: More than  4 times per year    Active Member of Clubs or Organizations: Yes    Attends Banker Meetings: 1 to 4 times per year    Marital Status: Divorced      Current Outpatient Medications:    albuterol  (PROAIR  HFA) 108 (90 Base) MCG/ACT inhaler, Inhale 2 puffs into Julia lungs every 6 (six) hours as needed for wheezing or shortness of breath., Disp: 8.5 g, Rfl: 6   albuterol  (PROVENTIL ) (2.5 MG/3ML) 0.083% nebulizer solution, USE 1 VIAL VIA NEBULIZER 1 TO 2 TIMES A DAY AS DIRECTED, Disp: 150 mL, Rfl: 1   budesonide -glycopyrrolate-formoterol  (BREZTRI  AEROSPHERE) 160-9-4.8 MCG/ACT AERO inhaler, Inhale 2 puffs into Julia lungs 2 (two) times daily., Disp: 10.7 g, Rfl: 11   buPROPion   (WELLBUTRIN  SR) 150 MG 12 hr tablet, Take 1 tablet (150 mg total) by mouth 3 (three) times daily., Disp: 270 tablet, Rfl: 1   cefdinir  (OMNICEF ) 300 MG capsule, Take 1 capsule (300 mg total) by mouth 2 (two) times daily., Disp: 14 capsule, Rfl: 0   CELEBREX 200 MG capsule, Take 1 capsule every day by oral route with meal(s)., Disp: , Rfl:    ciprofloxacin -dexamethasone  (CIPRODEX ) OTIC suspension, Place 4 drops into Julia right ear 2 (two) times daily for 7 days., Disp: 2.8 mL, Rfl: 0   ciprofloxacin -dexamethasone  (CIPRODEX ) OTIC suspension, Place 4 drops into Julia right ear 2 (two) times daily., Disp: 7.5 mL, Rfl: 0   diphenoxylate -atropine  (LOMOTIL ) 2.5-0.025 MG tablet, , Disp: , Rfl:    fluticasone  (FLONASE ) 50 MCG/ACT nasal spray, Place 2 sprays into both nostrils 2 (two) times daily., Disp: 54 mL, Rfl: 4   hyoscyamine  (LEVSIN  SL) 0.125 MG SL tablet, TAKE ONE TABLET BY MOUTH EVERY 4 HOURS AS NEEDED, Disp: 540 tablet, Rfl: 1   losartan  (COZAAR ) 25 MG tablet, Take 0.5 tablets (12.5 mg total) by mouth daily., Disp: 45 tablet, Rfl: 1   montelukast  (SINGULAIR ) 10 MG tablet, Take 1 tablet (10 mg total) by mouth at bedtime., Disp: 90 tablet, Rfl: 1   ondansetron  (ZOFRAN ) 8 MG tablet, Take 1 tablet (8 mg total) by mouth every 8 (eight) hours as needed for nausea or vomiting., Disp: 30 tablet, Rfl: 0   tirzepatide  (MOUNJARO ) 15 MG/0.5ML Pen, Inject 15 mg into Julia skin once a week., Disp: 6 mL, Rfl: 1   Vibegron (GEMTESA) 75 MG TABS, Take 1 tablet by mouth daily., Disp: , Rfl:    Physical Exam:   BP (!) 152/77 Comment: first attempt 152/77 second attempt 152/74  Pulse 72   SpO2 96%   Pertinent Findings  CN II-XII intact Right external auditory canal with dried impacted blood, once removed TM is intact, minor injection along Julia superior anterior portion, remainder of TM with no erythema, no obvious middle ear effusion Weber 512: equal Rinne 512: AC > BC b/l  No lesions of oral cavity/oropharynx;  dentition WNL No obviously palpable neck masses/lymphadenopathy/thyromegaly No respiratory distress or stridor  Seprately Identifiable Procedures:  None  Impression & Plans:  [name] is a 68 y.o. female with Julia following   Right-sided decreased hearing-  Decreased hearing in Julia right ear persistent today.  I was able to remove Julia impacted blood.  Once removed Julia TM appeared to be intact with no signs of rupture, she did have some injection, no signs of acute otitis media.  She reports continued decreased hearing on Julia right when compared to left.  She will need audiological evaluation.  I have referred her to  outpatient audiology at [facility] as they would be able to see her Julia soonest, she will call and make this appointment.  I will discuss Julia audiological results once available.  She understands Julia importance of prompt follow-up with audiology.   - f/u phone call discussion after audiological evaluation   Thank you for allowing me Julia opportunity to care for your [name]. Please do not hesitate to contact me should you have any other questions.  Sincerely, Julia Cohen PA-C [facility] Phone: [phone] Fax: [phone]  01/01/2024, 2:35 PM

## 2024-01-12 ENCOUNTER — Telehealth (INDEPENDENT_AMBULATORY_CARE_PROVIDER_SITE_OTHER): Payer: Self-pay | Admitting: Physician Assistant

## 2024-01-12 NOTE — Telephone Encounter (Addendum)
 Called patient to discuss CT results, no answer, voice mL left.  I also see that she does not have audiological evaluation scheduled, she will need to schedule this.  Update  I was able to speak with her today.  CT reviewed, no significant abnormalities other than some incidental findings that were described.  She notes her hearing is improving.  She has attempted to follow-up with audiology but due to limited availability the next available is 02/23/2024.  I encouraged her to continue on with the audiology evaluation.  If she develops any new or worsening signs or symptoms including worsening hearing she will reach out immediately.

## 2024-01-12 NOTE — Telephone Encounter (Signed)
Patient returned your call regarding imaging results.

## 2024-01-14 DIAGNOSIS — R1110 Vomiting, unspecified: Secondary | ICD-10-CM | POA: Diagnosis not present

## 2024-01-14 DIAGNOSIS — Z87891 Personal history of nicotine dependence: Secondary | ICD-10-CM | POA: Diagnosis not present

## 2024-01-14 DIAGNOSIS — N3000 Acute cystitis without hematuria: Secondary | ICD-10-CM | POA: Diagnosis not present

## 2024-01-14 DIAGNOSIS — I10 Essential (primary) hypertension: Secondary | ICD-10-CM | POA: Diagnosis not present

## 2024-01-14 DIAGNOSIS — J4489 Other specified chronic obstructive pulmonary disease: Secondary | ICD-10-CM | POA: Diagnosis not present

## 2024-01-14 DIAGNOSIS — Z880 Allergy status to penicillin: Secondary | ICD-10-CM | POA: Diagnosis not present

## 2024-01-14 DIAGNOSIS — Z79899 Other long term (current) drug therapy: Secondary | ICD-10-CM | POA: Diagnosis not present

## 2024-01-14 DIAGNOSIS — Z7985 Long-term (current) use of injectable non-insulin antidiabetic drugs: Secondary | ICD-10-CM | POA: Diagnosis not present

## 2024-01-14 DIAGNOSIS — R197 Diarrhea, unspecified: Secondary | ICD-10-CM | POA: Diagnosis not present

## 2024-01-14 DIAGNOSIS — R509 Fever, unspecified: Secondary | ICD-10-CM | POA: Diagnosis not present

## 2024-01-14 DIAGNOSIS — F1090 Alcohol use, unspecified, uncomplicated: Secondary | ICD-10-CM | POA: Diagnosis not present

## 2024-01-20 ENCOUNTER — Encounter (HOSPITAL_COMMUNITY): Payer: Self-pay

## 2024-01-20 ENCOUNTER — Ambulatory Visit (HOSPITAL_COMMUNITY)
Admission: EM | Admit: 2024-01-20 | Discharge: 2024-01-20 | Disposition: A | Discharge status: Home / Self Care | Attending: Internal Medicine | Admitting: Internal Medicine

## 2024-01-20 DIAGNOSIS — R051 Acute cough: Secondary | ICD-10-CM | POA: Diagnosis not present

## 2024-01-20 DIAGNOSIS — R0981 Nasal congestion: Secondary | ICD-10-CM

## 2024-01-20 DIAGNOSIS — J0100 Acute maxillary sinusitis, unspecified: Secondary | ICD-10-CM

## 2024-01-20 MED ORDER — METHYLPREDNISOLONE SODIUM SUCC 125 MG IJ SOLR
80.0000 mg | Freq: Once | INTRAMUSCULAR | Status: AC
  Administered 2024-01-20: 80 mg via INTRAMUSCULAR

## 2024-01-20 MED ORDER — AZITHROMYCIN 250 MG PO TABS: ORAL_TABLET | ORAL | 0 refills | Status: DC

## 2024-01-20 MED ORDER — METHYLPREDNISOLONE SODIUM SUCC 125 MG IJ SOLR
INTRAMUSCULAR | Status: AC
Start: 2024-01-20 — End: 2024-01-20
  Filled 2024-01-20: qty 2

## 2024-01-20 MED ORDER — PREDNISONE 20 MG PO TABS: 40.0000 mg | ORAL_TABLET | Freq: Every day | ORAL | 0 refills | Status: AC

## 2024-01-20 NOTE — ED Triage Notes (Addendum)
 Patient c/o a non productive coough, fever, headache, body aches and nasal congestion with green mucus x 4 days.  Patient has taken OTC sinus medication, alka Cold and Flu, and Robitussin. Patient also states she did an Albuterol  neb treatment today at noon.

## 2024-01-20 NOTE — Discharge Instructions (Addendum)
 Flu A, flu B and COVID testing done today. These are negative. Symptoms and physical exam findings are most consistent with an acute maxillary sinusitis causing exacerbation of asthma and COPD. Due to the severity, we will treat with the following: Azithromycin  250mg  Take 2 tablets today and the 1 tablet daily for 4 more days. Medrol  injection given today. This is a steroid to help with inflammation in the airway Start 01/21/24 Prednisone  40 mg (2 tablets) once daily for 5 days. Take this in the morning.  This is a steroid to help with inflammation in the airway.  Make sure to stay hydrated by drinking plenty of water. Return to urgent care or PCP if symptoms worsen or fail to resolve.

## 2024-01-20 NOTE — ED Provider Notes (Signed)
 [facility]    CSN: [id number] Arrival date & time: 01/20/24  1743      History   Chief Complaint Chief Complaint  Patient presents with   Cough   Fever   Headache   Generalized Body Aches   Nasal Congestion    HPI [name] is a 68 y.o. female.   68 year old female presents urgent care with complaints of nonproductive cough, sinus congestion, sinus pressure, headache, body aches and fevers.  This started about 4 days ago.  She has been taking cold and flu medication, Robitussin and sinus medication.  This is not helping much.  She has also been using her inhalers and nebulizing treatments more frequently due to the symptoms.  This does not help much either.  She has not had any known sick exposures.   Cough Associated symptoms: fever and headaches   Associated symptoms: no chest pain, no chills, no ear pain, no rash, no shortness of breath and no sore throat   Fever Associated symptoms: congestion, cough and headaches   Associated symptoms: no chest pain, no chills, no dysuria, no ear pain, no rash, no sore throat and no vomiting   Headache Associated symptoms: congestion, cough, fever and sinus pressure   Associated symptoms: no abdominal pain, no back pain, no ear pain, no eye pain, no seizures, no sore throat and no vomiting     Past Medical History:  Diagnosis Date   Asthma    Depression    Diabetes mellitus without complication (HCC)    Hyperlipidemia    Kidney stones    Migraine headache     Patient Active Problem List   Diagnosis Date Noted   Coronary artery disease with angina pectoris, unspecified vessel or lesion type, unspecified whether native or transplanted heart 10/17/2022   Statin myopathy 10/16/2021   Obesity (BMI 35.0-39.9 without comorbidity) 06/13/2021   COPD (chronic obstructive pulmonary disease) (HCC) 01/19/2021   Gastroesophageal reflux disease without esophagitis 01/19/2021   Absence of bladder continence 11/30/2020    Recurrent major depressive disorder, in full remission 11/30/2020   Chronic venous insufficiency 10/05/2020   Pain and swelling of lower leg 09/18/2020   Raynauds phenomenon 09/18/2020   History of total knee arthroplasty 09/08/2019   Vitamin D  deficiency 12/03/2018   History of arthroscopy of knee 09/10/2018   Knee arthropathy 07/28/2018   Anxiety 04/07/2018   Hyperlipidemia associated with type 2 diabetes mellitus (HCC) 04/07/2018   Mixed stress and urge urinary incontinence 02/25/2017   Right sided weakness 09/26/2016   Asthma 11/15/2015   Essential hypertension 10/20/2014   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      Home Medications    Prior to Admission medications   Medication Sig Start Date End Date Taking? Authorizing Provider  azithromycin  (ZITHROMAX ) 250 MG tablet Take  first 2 tablets together, then 1 every day until finished. 01/20/24  Yes Anderson Coppock A, PA-C  predniSONE  (DELTASONE ) 20 MG tablet Take 2 tablets (40 mg total) by mouth daily with breakfast for 5 days. 01/20/24 01/25/24 Yes Martavion Couper A, PA-C  albuterol  (PROAIR  HFA) 108 (90 Base) MCG/ACT inhaler Inhale 2 puffs into the lungs every 6 (six) hours as needed for wheezing or shortness of breath. 10/21/23   Johnson, Megan P, DO  albuterol  (PROVENTIL ) (2.5 MG/3ML) 0.083% nebulizer  solution USE 1 VIAL VIA NEBULIZER 1 TO 2 TIMES A DAY AS DIRECTED 10/17/22   Johnson, Megan P, DO  budesonide -glycopyrrolate-formoterol  (BREZTRI  AEROSPHERE) 160-9-4.8 MCG/ACT AERO inhaler Inhale 2 puffs into the lungs 2 (two) times daily. 10/21/23   Johnson, Megan P, DO  buPROPion  (WELLBUTRIN  SR) 150 MG 12 hr tablet Take 1 tablet (150 mg total) by mouth 3 (three) times daily. 10/21/23   Johnson, Megan P, DO  cefdinir  (OMNICEF ) 300 MG capsule Take 1 capsule (300 mg total) by mouth 2 (two) times daily. Patient not taking: Reported on 01/20/2024 12/25/23   Jilda Krystal HERO, DO  CELEBREX 200 MG capsule Take 1 capsule every day by oral route with meal(s). Patient not taking: Reported on 01/20/2024 03/31/23   [provider]  ciprofloxacin -dexamethasone  (CIPRODEX ) OTIC suspension Place 4 drops into the right ear 2 (two) times daily. Patient not taking: Reported on 01/20/2024 12/29/23   Palmer Purchase, PA-C  diphenoxylate -atropine  (LOMOTIL ) 2.5-0.025 MG tablet     [provider]  fluticasone  (FLONASE ) 50 MCG/ACT nasal spray Place 2 sprays into both nostrils 2 (two) times daily. 10/21/23   Johnson, Megan P, DO  hyoscyamine  (LEVSIN  SL) 0.125 MG SL tablet TAKE ONE TABLET BY MOUTH EVERY 4 HOURS AS NEEDED 10/21/23   Vicci, Megan P, DO  losartan  (COZAAR ) 25 MG tablet Take 0.5 tablets (12.5 mg total) by mouth daily. 10/21/23   Johnson, Megan P, DO  montelukast  (SINGULAIR ) 10 MG tablet Take 1 tablet (10 mg total) by mouth at bedtime. 10/21/23   Johnson, Megan P, DO  ondansetron  (ZOFRAN ) 8 MG tablet Take 1 tablet (8 mg total) by mouth every 8 (eight) hours as needed for nausea or vomiting. 10/21/23   Johnson, Megan P, DO  tirzepatide  (MOUNJARO ) 15 MG/0.5ML Pen Inject 15 mg into the skin once a week. 10/21/23   Johnson, Megan P, DO  Vibegron (GEMTESA) 75 MG TABS Take 1 tablet by mouth daily.    [provider]    Family History Family History  Problem Relation Age of Onset   Hypertension Mother    Cancer  Mother        breast   Arthritis Mother    Alzheimer's disease Mother    Parkinson's disease Mother    Breast cancer Mother 31   Hypertension Father    Diabetes Father    Kidney cancer Father    Breast cancer Cousin 58   Breast cancer Maternal Aunt    Breast cancer Maternal Grandmother 55    Social History Social History   Tobacco Use   Smoking status: Never   Smokeless tobacco: Never  Vaping Use   Vaping status: Never Used  Substance Use Topics   Alcohol use: Yes    Comment: social   Drug use: No     Allergies   Cymbalta  [duloxetine  hcl], Amoxicillin, and Atorvastatin    Review of Systems Review of Systems  Constitutional:  Positive for fever. Negative for chills.  HENT:  Positive for congestion, sinus  pressure and sinus pain. Negative for ear pain and sore throat.   Eyes:  Negative for pain and visual disturbance.  Respiratory:  Positive for cough. Negative for shortness of breath.   Cardiovascular:  Negative for chest pain and palpitations.  Gastrointestinal:  Negative for abdominal pain and vomiting.  Genitourinary:  Negative for dysuria and hematuria.  Musculoskeletal:  Negative for arthralgias and back pain.  Skin:  Negative for color change and rash.  Neurological:  Positive for headaches. Negative for seizures and syncope.  All other systems reviewed and are negative.    Physical Exam Triage Vital Signs ED Triage Vitals  Encounter Vitals Group     BP 01/20/24 1808 (!) 184/98     Girls Systolic BP Percentile --      Girls Diastolic BP Percentile --      Boys Systolic BP Percentile --      Boys Diastolic BP Percentile --      Pulse Rate 01/20/24 1808 81     Resp 01/20/24 1808 18     Temp 01/20/24 1808 97.8 F (36.6 C)     Temp Source 01/20/24 1808 Oral     SpO2 01/20/24 1807 96 %     Weight --      Height --      Head Circumference --      Peak Flow --      Pain Score 01/20/24 1804 6     Pain Loc --      Pain Education --      Exclude from  Growth Chart --    No data found.  Updated Vital Signs BP (!) 184/98 (BP Location: Right Arm) Comment: repeat BP- 171/98  Pulse 81   Temp 97.8 F (36.6 C) (Oral)   Resp 18   SpO2 96%   Visual Acuity Right Eye Distance:   Left Eye Distance:   Bilateral Distance:    Right Eye Near:   Left Eye Near:    Bilateral Near:     Physical Exam Vitals and nursing note reviewed.  Constitutional:      General: She is not in acute distress.    Appearance: She is well-developed.  HENT:     Head: Normocephalic and atraumatic.     Nose: Mucosal edema, congestion and rhinorrhea present. Rhinorrhea is purulent.     Right Sinus: Maxillary sinus tenderness and frontal sinus tenderness present.     Left Sinus: Maxillary sinus tenderness and frontal sinus tenderness present.  Eyes:     Extraocular Movements: Extraocular movements intact.     Conjunctiva/sclera: Conjunctivae normal.  Cardiovascular:     Rate and Rhythm: Normal rate and regular rhythm.     Heart sounds: No murmur heard. Pulmonary:     Effort: Pulmonary effort is normal. No tachypnea or respiratory distress.     Breath sounds: Normal breath sounds. No decreased breath sounds or wheezing.  Abdominal:     Palpations: Abdomen is soft.     Tenderness: There is no abdominal tenderness.  Musculoskeletal:        General: No swelling.     Cervical back: Neck supple.  Skin:    General: Skin is warm and dry.     Capillary Refill: Capillary refill takes less than 2 seconds.  Neurological:     Mental Status: She is alert.  Psychiatric:        Mood and Affect: Mood normal.      UC Treatments / Results  Labs (all labs  ordered are listed, but only abnormal results are displayed) Labs Reviewed  POC COVID19/FLU A&B COMBO    EKG   Radiology No results found.  Procedures Procedures (including critical care time)  Medications Ordered in UC Medications  methylPREDNISolone  sodium succinate (SOLU-MEDROL ) 125 mg/2 mL  injection 80 mg (80 mg Intramuscular Given 01/20/24 1839)    Initial Impression / Assessment and Plan / UC Course  I have reviewed the triage vital signs and the nursing notes.  Pertinent labs & imaging results that were available during my care of the patient were reviewed by me and considered in my medical decision making (see chart for details).     Acute non-recurrent maxillary sinusitis  Acute cough - Plan: POC Covid19/Flu A&B Antigen, POC Covid19/Flu A&B Antigen  Nasal congestion   Flu A, flu B and COVID testing done today. These are negative. Symptoms and physical exam findings are most consistent with an acute maxillary sinusitis causing exacerbation of asthma and COPD. Due to the severity, we will treat with the following: Azithromycin  250mg  Take 2 tablets today and the 1 tablet daily for 4 more days. Medrol  injection given today. This is a steroid to help with inflammation in the airway Start 01/21/24 Prednisone  40 mg (2 tablets) once daily for 5 days. Take this in the morning.  This is a steroid to help with inflammation in the airway.  Make sure to stay hydrated by drinking plenty of water. Return to urgent care or PCP if symptoms worsen or fail to resolve.   Final Clinical Impressions(s) / UC Diagnoses   Final diagnoses:  Acute cough  Acute non-recurrent maxillary sinusitis  Nasal congestion     Discharge Instructions      Flu A, flu B and COVID testing done today. These are negative. Symptoms and physical exam findings are most consistent with an acute maxillary sinusitis causing exacerbation of asthma and COPD. Due to the severity, we will treat with the following: Azithromycin  250mg  Take 2 tablets today and the 1 tablet daily for 4 more days. Medrol  injection given today. This is a steroid to help with inflammation in the airway Start 01/21/24 Prednisone  40 mg (2 tablets) once daily for 5 days. Take this in the morning.  This is a steroid to help with inflammation  in the airway.  Make sure to stay hydrated by drinking plenty of water. Return to urgent care or PCP if symptoms worsen or fail to resolve.       ED Prescriptions     Medication Sig Dispense Auth. Provider   azithromycin  (ZITHROMAX ) 250 MG tablet Take first 2 tablets together, then 1 every day until finished. 6 tablet Teresa Norris A, PA-C   predniSONE  (DELTASONE ) 20 MG tablet Take 2 tablets (40 mg total) by mouth daily with breakfast for 5 days. 10 tablet Teresa Norris LABOR, [place]      PDMP not reviewed this encounter.   Teresa Norris LABOR, [place] 01/20/24 1840

## 2024-02-02 ENCOUNTER — Ambulatory Visit (HOSPITAL_COMMUNITY)
Admission: RE | Admit: 2024-02-02 | Discharge: 2024-02-02 | Disposition: A | Discharge status: Home / Self Care | Source: Ambulatory Visit

## 2024-02-02 ENCOUNTER — Encounter (HOSPITAL_COMMUNITY): Payer: Self-pay

## 2024-02-02 VITALS — BP 128/93 | HR 78 | Temp 98.1°F | Resp 16

## 2024-02-02 DIAGNOSIS — R300 Dysuria: Secondary | ICD-10-CM

## 2024-02-02 DIAGNOSIS — N10 Acute tubulo-interstitial nephritis: Secondary | ICD-10-CM

## 2024-02-02 LAB — POCT URINALYSIS DIP (MANUAL ENTRY)
Bilirubin, UA: NEGATIVE
Glucose, UA: NEGATIVE mg/dL
Ketones, POC UA: NEGATIVE mg/dL
Nitrite, UA: NEGATIVE
Protein Ur, POC: NEGATIVE mg/dL
Spec Grav, UA: 1.015 (ref 1.010–1.025)
Urobilinogen, UA: 0.2 U/dL
pH, UA: 7 (ref 5.0–8.0)

## 2024-02-02 MED ORDER — PHENAZOPYRIDINE HCL 200 MG PO TABS: 200.0000 mg | ORAL_TABLET | Freq: Three times a day (TID) | ORAL | 0 refills | Status: AC

## 2024-02-02 MED ORDER — SULFAMETHOXAZOLE-TRIMETHOPRIM 800-160 MG PO TABS: 1.0000 | ORAL_TABLET | Freq: Two times a day (BID) | ORAL | 0 refills | Status: AC

## 2024-02-02 NOTE — ED Provider Notes (Signed)
 [facility]    CSN: [id number] Arrival date & time: 02/02/24  1645      History   Chief Complaint Chief Complaint  Patient presents with   Urinary Frequency    I think it is a UTI but not sure. - Entered by patient   Dysuria    HPI [name] is a 68 y.o. female.   Patient presents today due to dysuria, frequency, back pain, nausea, vomiting, and fever (high temp 100.0) since Saturday.  Patient states that she has been taking Tylenol  and omeprazole  for symptoms.  Patient states that she had a UTI in September for which she was given Keflex .  Patient states that she does not usually have UTIs as frequently.  Last time she had sexual intercourse was about 3 months ago.   Urinary Frequency  Dysuria   Past Medical History:  Diagnosis Date   Asthma    Depression    Diabetes mellitus without complication (HCC)    Hyperlipidemia    Kidney stones    Migraine headache     Patient Active Problem List   Diagnosis Date Noted   Coronary artery disease with angina pectoris, unspecified vessel or lesion type, unspecified whether native or transplanted heart 10/17/2022   Statin myopathy 10/16/2021   Obesity (BMI 35.0-39.9 without comorbidity) 06/13/2021   COPD (chronic obstructive pulmonary disease) (HCC) 01/19/2021   Gastroesophageal reflux disease without esophagitis 01/19/2021   Absence of bladder continence 11/30/2020   Recurrent major depressive disorder, in full remission 11/30/2020   Chronic venous insufficiency 10/05/2020   Pain and swelling of lower leg 09/18/2020   Raynauds phenomenon 09/18/2020   History of total knee arthroplasty 09/08/2019   Vitamin D  deficiency 12/03/2018   History of arthroscopy of knee 09/10/2018   Knee arthropathy 07/28/2018   Anxiety 04/07/2018   Hyperlipidemia associated with type 2 diabetes mellitus (HCC) 04/07/2018   Mixed stress and urge urinary incontinence 02/25/2017   Right sided weakness 09/26/2016   Asthma  11/15/2015   Essential hypertension 10/20/2014   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      Home Medications    Prior to Admission medications   Medication Sig Start Date End Date Taking? Authorizing Provider  phenazopyridine  (PYRIDIUM ) 200 MG tablet Take 1 tablet (200 mg total) by mouth 3 (three) times daily. 02/02/24  Yes Andra Krabbe C, PA-C  sulfamethoxazole -trimethoprim  (BACTRIM  DS) 800-160 MG tablet Take 1 tablet by mouth 2 (two) times daily for 7 days. 02/02/24 02/09/24 Yes Andra Krabbe BROCKS, PA-C  albuterol  (PROAIR  HFA) 108 [phone] Base) MCG/ACT inhaler Inhale 2 puffs into the lungs every 6 (six) hours as needed for wheezing or shortness of breath. 10/21/23   Johnson, Megan P, DO  albuterol  (PROVENTIL ) (2.5 MG/3ML) 0.083% nebulizer solution USE 1 VIAL VIA NEBULIZER 1 TO 2  TIMES A DAY AS DIRECTED 10/17/22   Vicci Bouchard P, DO  budesonide -glycopyrrolate-formoterol  (BREZTRI  AEROSPHERE) 160-9-4.8 MCG/ACT AERO inhaler Inhale 2 puffs into the lungs 2 (two) times daily. 10/21/23   Johnson, Megan P, DO  buPROPion  (WELLBUTRIN  SR) 150 MG 12 hr tablet Take 1 tablet (150 mg total) by mouth 3 (three) times daily. 10/21/23   Johnson, Megan P, DO  CELEBREX 200 MG capsule Take 1 capsule every day by oral route with meal(s). Patient not taking: Reported on 01/20/2024  03/31/23   [provider]  ciprofloxacin -dexamethasone  (CIPRODEX ) OTIC suspension Place 4 drops into the right ear 2 (two) times daily. Patient not taking: Reported on 01/20/2024 12/29/23   Palmer Purchase, PA-C  diphenoxylate -atropine  (LOMOTIL ) 2.5-0.025 MG tablet     [provider]  fluticasone  (FLONASE ) 50 MCG/ACT nasal spray Place 2 sprays into both nostrils 2 (two) times daily. 10/21/23   Johnson, Megan P, DO  hyoscyamine  (LEVSIN  SL) 0.125 MG SL tablet TAKE ONE TABLET BY MOUTH EVERY 4 HOURS AS NEEDED 10/21/23   Vicci, Megan P, DO  losartan  (COZAAR ) 25 MG tablet Take 0.5 tablets (12.5 mg total) by mouth daily. 10/21/23   Johnson, Megan P, DO  montelukast  (SINGULAIR ) 10 MG tablet Take 1 tablet (10 mg total) by mouth at bedtime. 10/21/23   Johnson, Megan P, DO  ondansetron  (ZOFRAN ) 8 MG tablet Take 1 tablet (8 mg total) by mouth every 8 (eight) hours as needed for nausea or vomiting. 10/21/23   Johnson, Megan P, DO  tirzepatide  (MOUNJARO ) 15 MG/0.5ML Pen Inject 15 mg into the skin once a week. 10/21/23   Johnson, Megan P, DO  Vibegron (GEMTESA) 75 MG TABS Take 1 tablet by mouth daily.    [provider]    Family History Family History  Problem Relation Age of Onset   Hypertension Mother    Cancer Mother        breast   Arthritis Mother    Alzheimer's disease Mother    Parkinson's disease Mother    Breast cancer Mother 84   Hypertension Father    Diabetes Father    Kidney cancer Father    Breast cancer Cousin 49   Breast cancer Maternal Aunt    Breast cancer Maternal Grandmother 68    Social History Social History   Tobacco Use   Smoking status: Never   Smokeless tobacco: Never  Vaping Use   Vaping status: Never Used  Substance Use Topics   Alcohol use: Yes    Comment: social   Drug use: No     Allergies   Cymbalta  [duloxetine  hcl], Amoxicillin, and Atorvastatin    Review of Systems Review of Systems  Genitourinary:  Positive for dysuria and  frequency.     Physical Exam Triage Vital Signs ED Triage Vitals  Encounter Vitals Group     BP 02/02/24 1710 (!) 128/93     Girls Systolic BP Percentile --      Girls Diastolic BP Percentile --      Boys Systolic BP Percentile --      Boys Diastolic BP Percentile --      Pulse Rate 02/02/24 1710 78     Resp 02/02/24 1710 16     Temp 02/02/24 1710 98.1 F (36.7 C)     Temp Source 02/02/24 1710 Oral     SpO2 02/02/24 1710 95 %     Weight --      Height --      Head Circumference --  Peak Flow --      Pain Score 02/02/24 1709 10     Pain Loc --      Pain Education --      Exclude from Growth Chart --    No data found.  Updated Vital Signs BP (!) 128/93 (BP Location: Left Arm)   Pulse 78   Temp 98.1 F (36.7 C) (Oral)   Resp 16   SpO2 95%   Visual Acuity Right Eye Distance:   Left Eye Distance:   Bilateral Distance:    Right Eye Near:   Left Eye Near:    Bilateral Near:     Physical Exam Vitals and nursing note reviewed.  Constitutional:      General: She is not in acute distress.    Appearance: Normal appearance. She is not ill-appearing, toxic-appearing or diaphoretic.  Eyes:     General: No scleral icterus. Cardiovascular:     Rate and Rhythm: Normal rate and regular rhythm.     Heart sounds: Normal heart sounds.  Pulmonary:     Effort: Pulmonary effort is normal. No respiratory distress.     Breath sounds: Normal breath sounds. No wheezing or rhonchi.  Abdominal:     General: Abdomen is flat. Bowel sounds are normal.     Palpations: Abdomen is soft.     Tenderness: There is abdominal tenderness in the right upper quadrant, right lower quadrant, suprapubic area, left upper quadrant and left lower quadrant. There is right CVA tenderness and left CVA tenderness.  Skin:    General: Skin is warm.  Neurological:     Mental Status: She is alert and oriented to person, place, and time.  Psychiatric:        Mood and Affect: Mood normal.         Behavior: Behavior normal.      UC Treatments / Results  Labs (all labs ordered are listed, but only abnormal results are displayed) Labs Reviewed  POCT URINALYSIS DIP (MANUAL ENTRY) - Abnormal; Notable for the following components:      Result Value   Clarity, UA cloudy (*)    Blood, UA trace-intact (*)    Leukocytes, UA Small (1+) (*)    All other components within normal limits    EKG   Radiology No results found.  Procedures Procedures (including critical care time)  Medications Ordered in UC Medications - No data to display  Initial Impression / Assessment and Plan / UC Course  I have reviewed the triage vital signs and the nursing notes.  Pertinent labs & imaging results that were available during my care of the patient were reviewed by me and considered in my medical decision making (see chart for details).     Pyelonephritis-prescribed Bactrim  twice daily for 7 days with Pyridium  for dysuria.  Sending urine for culture.  Will contact patient with results. Final Clinical Impressions(s) / UC Diagnoses   Final diagnoses:  Dysuria  Acute pyelonephritis   Discharge Instructions   None    ED Prescriptions     Medication Sig Dispense Auth. Provider   sulfamethoxazole -trimethoprim  (BACTRIM  DS) 800-160 MG tablet Take 1 tablet by mouth 2 (two) times daily for 7 days. 14 tablet Andra Krabbe C, PA-C   phenazopyridine  (PYRIDIUM ) 200 MG tablet Take 1 tablet (200 mg total) by mouth 3 (three) times daily. 6 tablet Andra Krabbe BROCKS, PA-C      PDMP not reviewed this encounter.   Andra Krabbe BROCKS, PA-C 02/02/24 1757

## 2024-02-02 NOTE — ED Triage Notes (Signed)
 Patient c/o dysuria and urinary frequency since yesterday. Patient states she has been taking Azo for her symptoms.

## 2024-02-20 DIAGNOSIS — N390 Urinary tract infection, site not specified: Secondary | ICD-10-CM | POA: Diagnosis not present

## 2024-02-23 ENCOUNTER — Ambulatory Visit: Attending: Physician Assistant | Admitting: Audiologist

## 2024-02-23 DIAGNOSIS — H903 Sensorineural hearing loss, bilateral: Secondary | ICD-10-CM | POA: Diagnosis not present

## 2024-02-23 DIAGNOSIS — H73811 Atrophic flaccid tympanic membrane, right ear: Secondary | ICD-10-CM | POA: Insufficient documentation

## 2024-02-23 NOTE — Procedures (Signed)
  [facility] [address] [place], [place]  [address] [phone]  AUDIOLOGICAL  EVALUATION  NAME: [name]     DOB:   February 25, 1956      MRN: [id number]                                                                                     DATE: 02/23/2024     REFERENT: Vicci Duwaine SQUIBB, DO STATUS: Outpatient DIAGNOSIS: History of Perforated Eardrum Right Ear, Sensorineural Hearing Loss Bilateral    History: Aysha was seen for an audiological evaluation due to a rupture of her eardrum in the right ear. She feels it is healing well, no recent pain or significant loss of hearing. She is followed by [facility] who referred her for a hearing test after evaluation for the right ear perforation.  Cymone denies pain, pressure, or tinnitus.  Tyleah has no history of hazardous noise exposure.  Medical history shows has no other risk for hearing loss.    Evaluation:  Otoscopy showed a clear view of the tympanic membranes, bilaterally. No visible perforation. Scab sitting in canal.  Tympanometry results were consistent with normal middle ear function, type A with normal volume showing perforation healed AD  Audiometric testing was completed using Conventional Audiometry techniques with insert earphones and supraural headphones. Test results are consistent with normal hearing sloping after 2kHz to a mild to moderate sensorineural hearing loss bilaterally. Speech Recognition Thresholds were obtained at  25dB HL in the right ear and at 20dB HL in the left ear. Word Recognition Testing was completed at  40dB SL and Herminia scored 100%   Results:  The test results were reviewed with Montie. Maclaine has a mild high pitched sensorineural hearing loss bilaterally. She likely would not see benefit from a hearing aid due to mild degree of loss. Her middle ear shows normal function and volume bilaterally. Right ear has healed well. No indication of  perforation today.  Recommendations: Monitor hearing as needed, no indication of lasting impact from perforation today. Hearing loss mild sensorineural and symmetric.  18 minutes spent testing and counseling on results.   If you have any questions please feel free to contact me at [phone].  Lauraine Ka Stalnaker Au.D.  Audiologist   02/23/2024  2:12 PM  Cc: Reyes Cohen PA

## 2024-03-25 DIAGNOSIS — L304 Erythema intertrigo: Secondary | ICD-10-CM | POA: Diagnosis not present

## 2024-04-03 ENCOUNTER — Other Ambulatory Visit: Payer: Self-pay | Admitting: Family Medicine

## 2024-04-06 NOTE — Telephone Encounter (Signed)
 Requested medication (s) are due for refill today: yes  Requested medication (s) are on the active medication list: yes  Last refill:  10/21/23  Future visit scheduled: {Yes  Notes to clinic:  Medication not assigned to a protocol, review manually.      Requested Prescriptions  Pending Prescriptions Disp Refills   MOUNJARO  15 MG/0.5ML Pen [Pharmacy Med Name: MOUNJARO  15 MG/0.5 ML PEN]  1    Sig: INJECT 15 MG INTO THE SKIN ONCE A WEEK.     Off-Protocol Failed - 04/06/2024 11:12 AM      Failed - Medication not assigned to a protocol, review manually.      Passed - Valid encounter within last 12 months    Recent Outpatient Visits           5 months ago Gastroenteritis   [place] [facility] [place], [place] P, DO   5 months ago Diabetes mellitus associated with hormonal etiology [facility])   [place] [facility] [place], Megan P, DO   10 months ago Acute non-recurrent frontal sinusitis   [place] [facility] [place], Melanie DASEN, NP

## 2024-04-17 DIAGNOSIS — R109 Unspecified abdominal pain: Secondary | ICD-10-CM | POA: Diagnosis not present

## 2024-04-17 DIAGNOSIS — J449 Chronic obstructive pulmonary disease, unspecified: Secondary | ICD-10-CM | POA: Diagnosis not present

## 2024-04-17 DIAGNOSIS — E119 Type 2 diabetes mellitus without complications: Secondary | ICD-10-CM | POA: Diagnosis not present

## 2024-04-17 DIAGNOSIS — R112 Nausea with vomiting, unspecified: Secondary | ICD-10-CM | POA: Diagnosis not present

## 2024-04-17 DIAGNOSIS — I10 Essential (primary) hypertension: Secondary | ICD-10-CM | POA: Diagnosis not present

## 2024-04-23 ENCOUNTER — Encounter: Admitting: Family Medicine

## 2024-04-26 ENCOUNTER — Ambulatory Visit: Admitting: Family Medicine

## 2024-04-26 ENCOUNTER — Encounter: Payer: Self-pay | Admitting: Family Medicine

## 2024-04-26 VITALS — BP 131/71 | HR 69 | Temp 98.2°F | Ht <= 58 in | Wt 165.4 lb

## 2024-04-26 DIAGNOSIS — F419 Anxiety disorder, unspecified: Secondary | ICD-10-CM | POA: Diagnosis not present

## 2024-04-26 DIAGNOSIS — E785 Hyperlipidemia, unspecified: Secondary | ICD-10-CM | POA: Diagnosis not present

## 2024-04-26 DIAGNOSIS — Z1211 Encounter for screening for malignant neoplasm of colon: Secondary | ICD-10-CM

## 2024-04-26 DIAGNOSIS — K900 Celiac disease: Secondary | ICD-10-CM | POA: Diagnosis not present

## 2024-04-26 DIAGNOSIS — Z0000 Encounter for general adult medical examination without abnormal findings: Secondary | ICD-10-CM | POA: Diagnosis not present

## 2024-04-26 DIAGNOSIS — G720 Drug-induced myopathy: Secondary | ICD-10-CM

## 2024-04-26 DIAGNOSIS — J449 Chronic obstructive pulmonary disease, unspecified: Secondary | ICD-10-CM

## 2024-04-26 DIAGNOSIS — J069 Acute upper respiratory infection, unspecified: Secondary | ICD-10-CM | POA: Diagnosis not present

## 2024-04-26 DIAGNOSIS — F3342 Major depressive disorder, recurrent, in full remission: Secondary | ICD-10-CM | POA: Diagnosis not present

## 2024-04-26 DIAGNOSIS — E559 Vitamin D deficiency, unspecified: Secondary | ICD-10-CM | POA: Diagnosis not present

## 2024-04-26 DIAGNOSIS — I10 Essential (primary) hypertension: Secondary | ICD-10-CM | POA: Diagnosis not present

## 2024-04-26 DIAGNOSIS — E1169 Type 2 diabetes mellitus with other specified complication: Secondary | ICD-10-CM

## 2024-04-26 LAB — MICROALBUMIN, URINE WAIVED
Creatinine, Urine Waived: 200 mg/dL (ref 10–300)
Microalb, Ur Waived: 30 mg/L — ABNORMAL HIGH (ref 0–19)
Microalb/Creat Ratio: <30 mg/g

## 2024-04-26 LAB — VERITOR FLU A/B WAIVED
Influenza A: NEGATIVE
Influenza B: NEGATIVE

## 2024-04-26 LAB — BAYER DCA HB A1C WAIVED: HB A1C (BAYER DCA - WAIVED): 5.2 % (ref 4.8–5.6)

## 2024-04-26 LAB — POC COVID19 BINAXNOW: SARS Coronavirus 2 Ag: NEGATIVE

## 2024-04-26 MED ORDER — PREDNISONE 50 MG PO TABS: 50.0000 mg | ORAL_TABLET | Freq: Every day | ORAL | 0 refills | Status: AC

## 2024-04-26 NOTE — Progress Notes (Signed)
 "  BP 131/71   Pulse 69   Temp 98.2 F (36.8 C) (Oral)   Ht 4' 9.5 (1.461 m)   Wt 165 lb 6.4 oz (75 kg)   SpO2 97%   BMI 35.17 kg/m    Subjective:    Patient ID: [name], female    DOB: 1955-05-27, 69 y.o.   MRN: [id number]  HPI: [name] is a 69 y.o. female presenting on 04/26/2024 for comprehensive medical examination. Current medical complaints include:  UPPER RESPIRATORY TRACT INFECTION Duration: couple of days Worst symptom: cough, congestion Fever: no Cough: yes Shortness of breath: no Wheezing: yes Chest pain: no Chest tightness: yes Chest congestion: no Nasal congestion: yes Runny nose: yes Post nasal drip: yes Sneezing: no Sore throat: no Swollen glands: no Sinus pressure: no Headache: yes Face pain: no Toothache: no Ear pain: no  Ear pressure: no  Eyes red/itching:no Eye drainage/crusting: no  Vomiting: no Rash: no Fatigue: yes Sick contacts: no Strep contacts: no  Context: worse Recurrent sinusitis: no Relief with OTC cold/cough medications: no  Treatments attempted: cough syrup   DIABETES Hypoglycemic episodes:no Polydipsia/polyuria: no Visual disturbance: no Chest pain: no Paresthesias: no Glucose Monitoring: no  Accucheck frequency: Not Checking Taking Insulin?: no Blood Pressure Monitoring: not checking Retinal Examination: Up to Date Foot Exam: Up to Date Diabetic Education: Completed Pneumovax: Up to Date Influenza: Up to Date Aspirin: yes  HYPERTENSION / HYPERLIPIDEMIA Satisfied with current treatment? yes Duration of hypertension: chronic BP monitoring frequency: not checking BP medication side effects: no Past BP meds: losartan  Duration of hyperlipidemia: chronic Cholesterol medication side effects: yes Cholesterol supplements: none Medication compliance: excellent compliance Aspirin: no Recent stressors: no Recurrent headaches: no Visual changes: no Palpitations: no Dyspnea: no Chest pain:  no Lower extremity edema: no Dizzy/lightheaded: no  DEPRESSION Mood status: controlled Satisfied with current treatment?: yes Symptom severity: mild  Duration of current treatment : chronic Side effects: no Medication compliance: excellent compliance Psychotherapy/counseling: no  Previous psychiatric medications: wellbutrin  Depressed mood: no Anxious mood: no Anhedonia: no Significant weight loss or gain: no Insomnia: no  Fatigue: no Feelings of worthlessness or guilt: no Impaired concentration/indecisiveness: no Suicidal ideations: no Hopelessness: no Crying spells: no    04/26/2024    8:16 AM 10/21/2023    8:29 AM 04/03/2023    8:14 AM 10/17/2022    8:16 AM 06/17/2022    8:41 AM  Depression screen PHQ 2/9  Decreased Interest 0 0 0 0 0  Down, Depressed, Hopeless 0 0 0 0 0  PHQ - 2 Score 0 0 0 0 0  Altered sleeping 0 0 0 0 0  Tired, decreased energy 0 0 0 0 0  Change in appetite 0 0 0 0 0  Feeling bad or failure about yourself  0 0 0 0 0  Trouble concentrating 0 0 0 0 0  Moving slowly or fidgety/restless 0 0 0 0 0  Suicidal thoughts 0 0 0 0 0  PHQ-9 Score 0 0  0  0  0   Difficult doing work/chores Not difficult at all  Not difficult at all Not difficult at all Not difficult at all     Data saved with a previous flowsheet row definition     Menopausal Symptoms: no  Depression Screen done today and results listed below:     04/26/2024    8:16 AM 10/21/2023    8:29 AM 04/03/2023    8:14 AM 10/17/2022    8:16 AM  06/17/2022    8:41 AM  Depression screen PHQ 2/9  Decreased Interest 0 0 0 0 0  Down, Depressed, Hopeless 0 0 0 0 0  PHQ - 2 Score 0 0 0 0 0  Altered sleeping 0 0 0 0 0  Tired, decreased energy 0 0 0 0 0  Change in appetite 0 0 0 0 0  Feeling bad or failure about yourself  0 0 0 0 0  Trouble concentrating 0 0 0 0 0  Moving slowly or fidgety/restless 0 0 0 0 0  Suicidal thoughts 0 0 0 0 0  PHQ-9 Score 0 0  0  0  0   Difficult doing work/chores Not  difficult at all  Not difficult at all Not difficult at all Not difficult at all     Data saved with a previous flowsheet row definition    Past Medical History:  Past Medical History:  Diagnosis Date   Allergy    Amoxicillin   Asthma    Depression    Diabetes mellitus without complication (HCC)    Hyperlipidemia    Kidney stones    Migraine headache     Surgical History:  Past Surgical History:  Procedure Laterality Date   ABDOMINAL HYSTERECTOMY     BREAST CYST ASPIRATION Right 1981   benign   BREAST CYST ASPIRATION Right 1984   benign   CHOLECYSTECTOMY     COLONOSCOPY  March 2016   FLEXIBLE BRONCHOSCOPY N/A 01/26/2018   Procedure: FLEXIBLE BRONCHOSCOPY;  Surgeon: Verdia Art, MD;  Location: [facility] ORS;  Service: Pulmonary;  Laterality: N/A;   MENISCUS REPAIR Left 09/18/2018   OOPHORECTOMY     PARTIAL KNEE ARTHROPLASTY Left 02/2019    Medications:  Current Outpatient Medications on File Prior to Visit  Medication Sig   albuterol  (PROVENTIL ) (2.5 MG/3ML) 0.083% nebulizer solution USE 1 VIAL VIA NEBULIZER 1 TO 2 TIMES A DAY AS DIRECTED   CELEBREX 200 MG capsule Take 1 capsule every day by oral route with meal(s).   diphenoxylate -atropine  (LOMOTIL ) 2.5-0.025 MG tablet  (Patient taking differently: AS NEEDED)   phenazopyridine  (PYRIDIUM ) 200 MG tablet Take 1 tablet (200 mg total) by mouth 3 (three) times daily.   Vibegron (GEMTESA) 75 MG TABS Take 1 tablet by mouth daily.   No current facility-administered medications on file prior to visit.    Allergies:  Allergies[1]  Social History:  Social History   Socioeconomic History   Marital status: Single    Spouse name: Not on file   Number of children: Not on file   Years of education: Not on file   Highest education level: Bachelor's degree (e.g., BA, AB, BS)  Occupational History   Not on file  Tobacco Use   Smoking status: Never   Smokeless tobacco: Never  Vaping Use   Vaping status: Never Used   Substance and Sexual Activity   Alcohol use: Yes    Comment: social   Drug use: No   Sexual activity: Not Currently  Other Topics Concern   Not on file  Social History Narrative   Not on file   Social Drivers of Health   Tobacco Use: Low Risk (04/26/2024)   Patient History    Smoking Tobacco Use: Never    Smokeless Tobacco Use: Never    Passive Exposure: Not on file  Financial Resource Strain: Low Risk (04/26/2024)   Overall Financial Resource Strain (CARDIA)    Difficulty of Paying Living Expenses: Not hard at all  Food Insecurity: No Food Insecurity (04/26/2024)   Epic    Worried About Programme Researcher, Broadcasting/film/video in the Last Year: Never true    Ran Out of Food in the Last Year: Never true  Transportation Needs: No Transportation Needs (04/26/2024)   Epic    Lack of Transportation (Medical): No    Lack of Transportation (Non-Medical): No  Physical Activity: Sufficiently Active (04/26/2024)   Exercise Vital Sign    Days of Exercise per Week: 7 days    Minutes of Exercise per Session: 30 min  Stress: No Stress Concern Present (04/26/2024)   [employer] of Occupational Health - Occupational Stress Questionnaire    Feeling of Stress: Not at all  Social Connections: Moderately Integrated (04/26/2024)   Social Connection and Isolation Panel    Frequency of Communication with Friends and Family: More than three times a week    Frequency of Social Gatherings with Friends and Family: More than three times a week    Attends Religious Services: More than 4 times per year    Active Member of Clubs or Organizations: Yes    Attends Banker Meetings: 1 to 4 times per year    Marital Status: Divorced  Intimate Partner Violence: Not At Risk (04/26/2024)   Epic    Fear of Current or Ex-Partner: No    Emotionally Abused: No    Physically Abused: No    Sexually Abused: No  Depression (PHQ2-9): Low Risk (04/26/2024)   Depression (PHQ2-9)    PHQ-2 Score: 0  Alcohol Screen: Low Risk  (04/26/2024)   Alcohol Screen    Last Alcohol Screening Score (AUDIT): 1  Housing: Low Risk (04/26/2024)   Epic    Unable to Pay for Housing in the Last Year: No    Number of Times Moved in the Last Year: 0    Homeless in the Last Year: No  Utilities: Not At Risk (04/26/2024)   Epic    Threatened with loss of utilities: No  Health Literacy: Adequate Health Literacy (04/26/2024)   B1300 Health Literacy    Frequency of need for help with medical instructions: Never   Tobacco Use History[2] Social History   Substance and Sexual Activity  Alcohol Use Yes   Comment: social    Family History:  Family History  Problem Relation Age of Onset   Hypertension Mother    Cancer Mother        breast   Arthritis Mother    Alzheimer's disease Mother    Parkinson's disease Mother    Breast cancer Mother 8   Hypertension Father    Diabetes Father    Kidney cancer Father    Kidney disease Father    Breast cancer Cousin 35   Breast cancer Maternal Aunt    Breast cancer Maternal Grandmother 76    Past medical history, surgical history, medications, allergies, family history and social history reviewed with patient today and changes made to appropriate areas of the chart.   Review of Systems  Constitutional:  Positive for diaphoresis. Negative for chills, fever, malaise/fatigue and weight loss.  HENT:  Positive for congestion. Negative for ear discharge, ear pain, hearing loss, nosebleeds, sinus pain, sore throat and tinnitus.   Eyes: Negative.   Respiratory:  Positive for cough. Negative for hemoptysis, sputum production, shortness of breath, wheezing and stridor.   Cardiovascular: Negative.   Gastrointestinal: Negative.   Genitourinary: Negative.   Musculoskeletal: Negative.   Skin: Negative.   Neurological: Negative.  Endo/Heme/Allergies:  Positive for polydipsia. Negative for environmental allergies. Does not bruise/bleed easily.  Psychiatric/Behavioral: Negative.     All other ROS  negative except what is listed above and in the HPI.      Objective:    BP 131/71   Pulse 69   Temp 98.2 F (36.8 C) (Oral)   Ht 4' 9.5 (1.461 m)   Wt 165 lb 6.4 oz (75 kg)   SpO2 97%   BMI 35.17 kg/m   Wt Readings from Last 3 Encounters:  04/26/24 165 lb 6.4 oz (75 kg)  12/29/23 169 lb (76.7 kg)  10/21/23 181 lb 6.4 oz (82.3 kg)    Physical Exam Vitals and nursing note reviewed.  Constitutional:      General: She is not in acute distress.    Appearance: Normal appearance. She is not ill-appearing, toxic-appearing or diaphoretic.  HENT:     Head: Normocephalic and atraumatic.     Right Ear: Tympanic membrane, ear canal and external ear normal. There is no impacted cerumen.     Left Ear: Tympanic membrane, ear canal and external ear normal. There is no impacted cerumen.     Nose: Nose normal. No congestion or rhinorrhea.     Mouth/Throat:     Mouth: Mucous membranes are moist.     Pharynx: Oropharynx is clear. No oropharyngeal exudate or posterior oropharyngeal erythema.  Eyes:     General: No scleral icterus.       Right eye: No discharge.        Left eye: No discharge.     Extraocular Movements: Extraocular movements intact.     Conjunctiva/sclera: Conjunctivae normal.     Pupils: Pupils are equal, round, and reactive to light.  Neck:     Vascular: No carotid bruit.  Cardiovascular:     Rate and Rhythm: Normal rate and regular rhythm.     Pulses: Normal pulses.     Heart sounds: No murmur heard.    No friction rub. No gallop.  Pulmonary:     Effort: Pulmonary effort is normal. No respiratory distress.     Breath sounds: Normal breath sounds. No stridor. No wheezing, rhonchi or rales.  Chest:     Chest wall: No tenderness.  Abdominal:     General: Abdomen is flat. Bowel sounds are normal. There is no distension.     Palpations: Abdomen is soft. There is no mass.     Tenderness: There is no abdominal tenderness. There is no right CVA tenderness, left CVA  tenderness, guarding or rebound.     Hernia: No hernia is present.  Genitourinary:    Comments: Breast and pelvic exams deferred with shared decision making Musculoskeletal:        General: No swelling, tenderness, deformity or signs of injury.     Cervical back: Normal range of motion and neck supple. No rigidity. No muscular tenderness.     Right lower leg: No edema.     Left lower leg: No edema.  Lymphadenopathy:     Cervical: No cervical adenopathy.  Skin:    General: Skin is warm and dry.     Capillary Refill: Capillary refill takes less than 2 seconds.     Coloration: Skin is not jaundiced or pale.     Findings: No bruising, erythema, lesion or rash.  Neurological:     General: No focal deficit present.     Mental Status: She is alert and oriented to person, place, and time. Mental status  is at baseline.     Cranial Nerves: No cranial nerve deficit.     Sensory: No sensory deficit.     Motor: No weakness.     Coordination: Coordination normal.     Gait: Gait normal.     Deep Tendon Reflexes: Reflexes normal.  Psychiatric:        Mood and Affect: Mood normal.        Behavior: Behavior normal.        Thought Content: Thought content normal.        Judgment: Judgment normal.     Results for orders placed or performed in visit on 04/26/24  Veritor Flu A/B Waived   Collection Time: 04/26/24  8:37 AM  Result Value Ref Range   Influenza A Negative Negative   Influenza B Negative Negative  Bayer DCA Hb A1c Waived   Collection Time: 04/26/24  8:38 AM  Result Value Ref Range   HB A1C (BAYER DCA - WAIVED) 5.2 4.8 - 5.6 %  Microalbumin, Urine Waived   Collection Time: 04/26/24  8:38 AM  Result Value Ref Range   Microalb, Ur Waived 30 (H) 0 - 19 mg/L   Creatinine, Urine Waived 200 10 - 300 mg/dL   Microalb/Creat Ratio <30 <30 mg/g  CBC with Differential/Platelet   Collection Time: 04/26/24  8:39 AM  Result Value Ref Range   WBC 9.0 3.4 - 10.8 x10E3/uL   RBC 4.12 3.77 -  5.28 x10E6/uL   Hemoglobin 12.8 11.1 - 15.9 g/dL   Hematocrit 60.1 65.9 - 46.6 %   MCV 97 79 - 97 fL   MCH 31.1 26.6 - 33.0 pg   MCHC 32.2 31.5 - 35.7 g/dL   RDW 88.0 88.2 - 84.5 %   Platelets 285 150 - 450 x10E3/uL   Neutrophils 76 Not Estab. %   Lymphs 16 Not Estab. %   Monocytes 6 Not Estab. %   Eos 2 Not Estab. %   Basos 0 Not Estab. %   Neutrophils Absolute 6.8 1.4 - 7.0 x10E3/uL   Lymphocytes Absolute 1.4 0.7 - 3.1 x10E3/uL   Monocytes Absolute 0.5 0.1 - 0.9 x10E3/uL   EOS (ABSOLUTE) 0.2 0.0 - 0.4 x10E3/uL   Basophils Absolute 0.0 0.0 - 0.2 x10E3/uL   Immature Granulocytes 0 Not Estab. %   Immature Grans (Abs) 0.0 0.0 - 0.1 x10E3/uL  Comprehensive metabolic panel with GFR   Collection Time: 04/26/24  8:39 AM  Result Value Ref Range   Glucose 93 70 - 99 mg/dL   BUN 10 8 - 27 mg/dL   Creatinine, Ser 9.39 0.57 - 1.00 mg/dL   eGFR 97 >40 fO/fpw/8.26   BUN/Creatinine Ratio 17 12 - 28   Sodium 139 134 - 144 mmol/L   Potassium 4.6 3.5 - 5.2 mmol/L   Chloride 102 96 - 106 mmol/L   CO2 20 20 - 29 mmol/L   Calcium  9.6 8.7 - 10.3 mg/dL   Total Protein 6.3 6.0 - 8.5 g/dL   Albumin 4.5 3.9 - 4.9 g/dL   Globulin, Total 1.8 1.5 - 4.5 g/dL   Bilirubin Total 0.4 0.0 - 1.2 mg/dL   Alkaline Phosphatase 70 49 - 135 IU/L   AST 15 0 - 40 IU/L   ALT 26 0 - 32 IU/L  Lipid Panel w/o Chol/HDL Ratio   Collection Time: 04/26/24  8:39 AM  Result Value Ref Range   Cholesterol, Total 149 100 - 199 mg/dL   Triglycerides 875 0 - 149 mg/dL  HDL 57 >39 mg/dL   VLDL Cholesterol Cal 22 5 - 40 mg/dL   LDL Chol Calc (NIH) 70 0 - 99 mg/dL  TSH   Collection Time: 04/26/24  8:39 AM  Result Value Ref Range   TSH 2.970 0.450 - 4.500 uIU/mL  VITAMIN D  25 Hydroxy (Vit-D Deficiency, Fractures)   Collection Time: 04/26/24  8:39 AM  Result Value Ref Range   Vit D, 25-Hydroxy 48.6 30.0 - 100.0 ng/mL  B12   Collection Time: 04/26/24  8:39 AM  Result Value Ref Range   Vitamin B-12 1,090 232 - 1,245  pg/mL  POC COVID-19   Collection Time: 04/26/24  8:56 AM  Result Value Ref Range   SARS Coronavirus 2 Ag Negative Negative      Assessment & Plan:   Problem List Items Addressed This Visit       Cardiovascular and Mediastinum   Essential hypertension   Under good control on current regimen. Continue current regimen. Continue to monitor. Call with any concerns. Refills given.  Labs drawn today.       Relevant Medications   losartan  (COZAAR ) 25 MG tablet   Other Relevant Orders   CBC with Differential/Platelet (Completed)   Comprehensive metabolic panel with GFR (Completed)   TSH (Completed)     Respiratory   COPD (chronic obstructive pulmonary disease) (HCC)   Under good control on current regimen. Continue current regimen. Continue to monitor. Call with any concerns. Refills given.        Relevant Medications   predniSONE  (DELTASONE ) 50 MG tablet   albuterol  (PROAIR  HFA) 108 (90 Base) MCG/ACT inhaler   budesonide -glycopyrrolate-formoterol  (BREZTRI  AEROSPHERE) 160-9-4.8 MCG/ACT AERO inhaler   fluticasone  (FLONASE ) 50 MCG/ACT nasal spray   montelukast  (SINGULAIR ) 10 MG tablet   Other Relevant Orders   CBC with Differential/Platelet (Completed)   Comprehensive metabolic panel with GFR (Completed)     Digestive   Celiac sprue   Continue gluten avoidance. Will check B12. Await results.       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 Continue current regimen. Continue to monitor. Call with any concerns. Refills given today.       Relevant Medications   losartan  (COZAAR ) 25 MG tablet   tirzepatide  (MOUNJARO ) 15 MG/0.5ML Pen   Other Relevant Orders   CBC with Differential/Platelet (Completed)   Comprehensive metabolic panel with GFR (Completed)   Bayer DCA Hb A1c Waived (Completed)   Microalbumin, Urine Waived (Completed)   Hyperlipidemia associated with type 2 diabetes  mellitus (HCC)   Unable to tolerate statins. Rechecking labs today. Await results. Treat as needed.       Relevant Medications   losartan  (COZAAR ) 25 MG tablet   tirzepatide  (MOUNJARO ) 15 MG/0.5ML Pen   Other Relevant Orders   CBC with Differential/Platelet (Completed)   Comprehensive metabolic panel with GFR (Completed)   Lipid Panel w/o Chol/HDL Ratio (Completed)     Musculoskeletal and Integument   Statin myopathy   Unable to tolerate statins. Rechecking labs today. Await results. Treat as needed.       Relevant Orders   CBC with Differential/Platelet (Completed)   Comprehensive metabolic panel with GFR (Completed)     Other   Anxiety   Under good control on current regimen. Continue current regimen. Continue to monitor. Call with any concerns. Refills given.        Relevant Medications  buPROPion  (WELLBUTRIN  SR) 150 MG 12 hr tablet   Other Relevant Orders   CBC with Differential/Platelet (Completed)   Comprehensive metabolic panel with GFR (Completed)   Vitamin D  deficiency   Rechecking labs today. Await results. Treat as needed.       Relevant Orders   CBC with Differential/Platelet (Completed)   Comprehensive metabolic panel with GFR (Completed)   VITAMIN D  25 Hydroxy (Vit-D Deficiency, Fractures) (Completed)   Recurrent major depressive disorder, in full remission   Under good control on current regimen. Continue current regimen. Continue to monitor. Call with any concerns. Refills given.  Labs drawn today.       Relevant Medications   buPROPion  (WELLBUTRIN  SR) 150 MG 12 hr tablet   Other Relevant Orders   CBC with Differential/Platelet (Completed)   Comprehensive metabolic panel with GFR (Completed)   Other Visit Diagnoses       Routine general medical examination at a health care facility    -  Primary   Vaccines up to date. Screening labs checked today. Colonoscopy up to date. DEXA and mammo up to date. Continue to monitor. Call with any concerns.      Upper respiratory tract infection, unspecified type       Flu negative. Will treat with burst of prednisone . Call with any concerns.   Relevant Orders   Veritor Flu A/B Waived (Completed)   POC COVID-19 (Completed)     Screening for colon cancer       Referral to GI placed today.   Relevant Orders   Ambulatory referral to Gastroenterology        Follow up plan: Return in about 6 months (around 10/24/2024).   LABORATORY TESTING:  - Pap smear: not applicable  IMMUNIZATIONS:   - Tdap: Tetanus vaccination status reviewed: last tetanus booster within 10 years. - Influenza: Refused - Pneumovax: Up to date - Prevnar: Up to date - COVID: Refused - HPV: Not applicable - Shingrix  vaccine: Up to date  SCREENING: -Mammogram: Up to date  - Colonoscopy: Ordered today  - Bone Density: Up to date   PATIENT COUNSELING:   Advised to take 1 mg of folate supplement per day if capable of pregnancy.   Sexuality: Discussed sexually transmitted diseases, partner selection, use of condoms, avoidance of unintended pregnancy  and contraceptive alternatives.   Advised to avoid cigarette smoking.  I discussed with the patient that most people either abstain from alcohol or drink within safe limits (<=14/week and <=4 drinks/occasion for males, <=7/weeks and <= 3 drinks/occasion for females) and that the risk for alcohol disorders and other health effects rises proportionally with the number of drinks per week and how often a drinker exceeds daily limits.  Discussed cessation/primary prevention of drug use and availability of treatment for abuse.   Diet: Encouraged to adjust caloric intake to maintain  or achieve ideal body weight, to reduce intake of dietary saturated fat and total fat, to limit sodium intake by avoiding high sodium foods and not adding table salt, and to maintain adequate dietary potassium and calcium  preferably from fresh fruits, vegetables, and low-fat dairy products.    stressed  the importance of regular exercise  Injury prevention: Discussed safety belts, safety helmets, smoke detector, smoking near bedding or upholstery.   Dental health: Discussed importance of regular tooth brushing, flossing, and dental visits.    NEXT PREVENTATIVE PHYSICAL DUE IN 1 YEAR. Return in about 6 months (around 10/24/2024).              [  1]  Allergies Allergen Reactions   Cymbalta  [Duloxetine  Hcl] Other (See Comments)    Hallucinations   Amoxicillin Hives   Atorvastatin  Other (See Comments)    myalgias  [2]  Social History Tobacco Use  Smoking Status Never  Smokeless Tobacco Never   "

## 2024-04-27 ENCOUNTER — Encounter: Payer: Self-pay | Admitting: Family Medicine

## 2024-04-27 LAB — LIPID PANEL W/O CHOL/HDL RATIO
Cholesterol, Total: 149 mg/dL (ref 100–199)
HDL: 57 mg/dL
LDL Chol Calc (NIH): 70 mg/dL (ref 0–99)
Triglycerides: 124 mg/dL (ref 0–149)
VLDL Cholesterol Cal: 22 mg/dL (ref 5–40)

## 2024-04-27 LAB — CBC WITH DIFFERENTIAL/PLATELET
Basophils Absolute: 0 x10E3/uL (ref 0.0–0.2)
Basos: 0 %
EOS (ABSOLUTE): 0.2 x10E3/uL (ref 0.0–0.4)
Eos: 2 %
Hematocrit: 39.8 % (ref 34.0–46.6)
Hemoglobin: 12.8 g/dL (ref 11.1–15.9)
Immature Grans (Abs): 0 x10E3/uL (ref 0.0–0.1)
Immature Granulocytes: 0 %
Lymphocytes Absolute: 1.4 x10E3/uL (ref 0.7–3.1)
Lymphs: 16 %
MCH: 31.1 pg (ref 26.6–33.0)
MCHC: 32.2 g/dL (ref 31.5–35.7)
MCV: 97 fL (ref 79–97)
Monocytes Absolute: 0.5 x10E3/uL (ref 0.1–0.9)
Monocytes: 6 %
Neutrophils Absolute: 6.8 x10E3/uL (ref 1.4–7.0)
Neutrophils: 76 %
Platelets: 285 x10E3/uL (ref 150–450)
RBC: 4.12 x10E6/uL (ref 3.77–5.28)
RDW: 11.9 % (ref 11.7–15.4)
WBC: 9 x10E3/uL (ref 3.4–10.8)

## 2024-04-27 LAB — COMPREHENSIVE METABOLIC PANEL WITH GFR
ALT: 26 IU/L (ref 0–32)
AST: 15 IU/L (ref 0–40)
Albumin: 4.5 g/dL (ref 3.9–4.9)
Alkaline Phosphatase: 70 IU/L (ref 49–135)
BUN/Creatinine Ratio: 17 (ref 12–28)
BUN: 10 mg/dL (ref 8–27)
Bilirubin Total: 0.4 mg/dL (ref 0.0–1.2)
CO2: 20 mmol/L (ref 20–29)
Calcium: 9.6 mg/dL (ref 8.7–10.3)
Chloride: 102 mmol/L (ref 96–106)
Creatinine, Ser: 0.6 mg/dL (ref 0.57–1.00)
Globulin, Total: 1.8 g/dL (ref 1.5–4.5)
Glucose: 93 mg/dL (ref 70–99)
Potassium: 4.6 mmol/L (ref 3.5–5.2)
Sodium: 139 mmol/L (ref 134–144)
Total Protein: 6.3 g/dL (ref 6.0–8.5)
eGFR: 97 mL/min/1.73

## 2024-04-27 LAB — VITAMIN B12: Vitamin B-12: 1090 pg/mL (ref 232–1245)

## 2024-04-27 LAB — TSH: TSH: 2.97 u[IU]/mL (ref 0.450–4.500)

## 2024-04-27 LAB — VITAMIN D 25 HYDROXY (VIT D DEFICIENCY, FRACTURES): Vit D, 25-Hydroxy: 48.6 ng/mL (ref 30.0–100.0)

## 2024-04-27 MED ORDER — BREZTRI AEROSPHERE 160-9-4.8 MCG/ACT IN AERO: 2.0000 | INHALATION_SPRAY | Freq: Two times a day (BID) | RESPIRATORY_TRACT | 11 refills | Status: AC

## 2024-04-27 MED ORDER — MONTELUKAST SODIUM 10 MG PO TABS: 10.0000 mg | ORAL_TABLET | Freq: Every day | ORAL | 1 refills | Status: AC

## 2024-04-27 MED ORDER — FLUTICASONE PROPIONATE 50 MCG/ACT NA SUSP: 2.0000 | Freq: Two times a day (BID) | NASAL | 4 refills | Status: AC

## 2024-04-27 MED ORDER — ONDANSETRON HCL 8 MG PO TABS: 8.0000 mg | ORAL_TABLET | Freq: Three times a day (TID) | ORAL | 0 refills | Status: AC | PRN

## 2024-04-27 MED ORDER — LOSARTAN POTASSIUM 25 MG PO TABS: 12.5000 mg | ORAL_TABLET | Freq: Every day | ORAL | 1 refills | Status: AC

## 2024-04-27 MED ORDER — HYOSCYAMINE SULFATE 0.125 MG SL SUBL: SUBLINGUAL_TABLET | SUBLINGUAL | 1 refills | Status: AC

## 2024-04-27 MED ORDER — BUPROPION HCL ER (SR) 150 MG PO TB12: 150.0000 mg | ORAL_TABLET | Freq: Three times a day (TID) | ORAL | 1 refills | Status: AC

## 2024-04-27 MED ORDER — ALBUTEROL SULFATE HFA 108 (90 BASE) MCG/ACT IN AERS: 2.0000 | INHALATION_SPRAY | Freq: Four times a day (QID) | RESPIRATORY_TRACT | 6 refills | Status: AC | PRN

## 2024-04-27 MED ORDER — MOUNJARO 15 MG/0.5ML ~~LOC~~ SOAJ: 15.0000 mg | SUBCUTANEOUS | 1 refills | Status: AC

## 2024-04-27 NOTE — Assessment & Plan Note (Signed)
Unable to tolerate statins. Rechecking labs today. Await results. Treat as needed.  

## 2024-04-27 NOTE — Assessment & Plan Note (Signed)
 Labs doing great at 5.2. Continue current regimen. Continue to monitor. Call with any concerns. Refills given today.

## 2024-04-27 NOTE — Assessment & Plan Note (Signed)
 Under good control on current regimen. Continue current regimen. Continue to monitor. Call with any concerns. Refills given. Labs drawn today.

## 2024-04-27 NOTE — Assessment & Plan Note (Signed)
 Under good control on current regimen. Continue current regimen. Continue to monitor. Call with any concerns. Refills given.

## 2024-04-27 NOTE — Assessment & Plan Note (Signed)
 Continue gluten avoidance. Will check B12. Await results.

## 2024-04-27 NOTE — Assessment & Plan Note (Signed)
 Rechecking labs today. Await results. Treat as needed.

## 2024-04-28 ENCOUNTER — Other Ambulatory Visit: Payer: Self-pay | Admitting: Medical Genetics

## 2024-04-28 ENCOUNTER — Telehealth: Payer: Self-pay

## 2024-04-28 NOTE — Telephone Encounter (Signed)
 Copied from CRM [id number]. Topic: General - Other >> Apr 28, 2024  2:01 PM [name] wrote: Reason for CRM: Patient called in regarding needing a antibiotic , patient also stated she is waiting for lab results, would like a callback

## 2024-04-28 NOTE — Telephone Encounter (Signed)
 See mychart message regarding patient's request for an antibiotic. Lab results have note been resulted by provider yet.

## 2024-04-28 NOTE — Telephone Encounter (Signed)
 Pt has called back in and still feels bad and is needing to know about the results and if she should take some antibiotics. Please advise pt asap.

## 2024-04-29 ENCOUNTER — Encounter: Payer: Self-pay | Admitting: Family Medicine

## 2024-04-29 ENCOUNTER — Ambulatory Visit: Admitting: Family Medicine

## 2024-04-29 ENCOUNTER — Ambulatory Visit: Payer: Self-pay | Admitting: Family Medicine

## 2024-04-29 VITALS — BP 120/56 | HR 75 | Temp 98.4°F | Ht <= 58 in | Wt 168.0 lb

## 2024-04-29 DIAGNOSIS — J0140 Acute pansinusitis, unspecified: Secondary | ICD-10-CM | POA: Insufficient documentation

## 2024-04-29 MED ORDER — HYDROCOD POLI-CHLORPHE POLI ER 10-8 MG/5ML PO SUER: 5.0000 mL | Freq: Two times a day (BID) | ORAL | 0 refills | Status: AC | PRN

## 2024-04-29 MED ORDER — AZITHROMYCIN 250 MG PO TABS: ORAL_TABLET | ORAL | 0 refills | Status: AC

## 2024-04-29 MED ORDER — PROMETHAZINE-DM 6.25-15 MG/5ML PO SYRP: 5.0000 mL | ORAL_SOLUTION | Freq: Four times a day (QID) | ORAL | 0 refills | Status: AC | PRN

## 2024-04-29 MED ORDER — AZELASTINE HCL 0.1 % NA SOLN: 2.0000 | Freq: Two times a day (BID) | NASAL | 0 refills | Status: AC

## 2024-04-29 MED ORDER — BENZONATATE 200 MG PO CAPS: 200.0000 mg | ORAL_CAPSULE | Freq: Two times a day (BID) | ORAL | 0 refills | Status: AC | PRN

## 2024-04-29 NOTE — Telephone Encounter (Signed)
 Called and LVM asking for patient to please return my call.   OK for E2C2 to speak to patient and advise her of the message from Dr. Vicci regarding labs and antibiotic.

## 2024-04-29 NOTE — Assessment & Plan Note (Signed)
 History of Present Illness [name] is a 69 year old female with chronic obstructive pulmonary disease who presents with one week of worsening cough and upper respiratory symptoms.  Cough and respiratory symptoms - One week of progressively worsening cough, sneezing, and dyspnea - Cough is persistent, nocturnal, and productive of green sputum - Significant dyspnea present - No improvement with nearing completion of steroid course, which is atypical for her - Maintenance therapy includes Breztri  and as-needed albuterol   Fever and constitutional symptoms - Fever primarily in the evenings, with a recorded temperature of 101F last night - Afebrile this morning - Mild myalgias present  Sinus and facial pain - Facial pressure and pain involving the forehead, nasal bridge, and cheeks - No current otologic symptoms - History of ear issue a couple of months ago  Recent infectious disease evaluation and treatments - Evaluated on January 5th and tested negative for influenza and COVID-19 - Current medications include Flonase  nasal spray, Tylenol  Cold, vitamin C, zinc , and vitamin D   Functional status - Continued working despite symptoms  Physical Exam VITALS: SaO2- 93% HEENT: Tympanic membranes dull gray with no posterior fluid. Nasopharynx exhibits significant swelling and erythema. Tenderness in frontal, ethmoid, and maxillary sinuses, maximal at ethmoid. NECK: Bilateral cervical lymphadenopathy at the posterior cervical chain. CHEST: Lungs clear to auscultation bilaterally, no wheezes, rales, or rhonchi. Initial coarse breath sounds in the left lower lobe cleared with coughing. CARDIOVASCULAR: Heart sounds normal.  Results Labs Influenza PCR (04/26/2024): Negative COVID-19 PCR (04/26/2024): Negative Vitamin D  (04/26/2024): Within normal limits  Assessment and Plan Acute bacterial sinusitis Acute bacterial sinusitis with persistent symptoms and purulent sputum,  unresponsive to intranasal steroids, indicating bacterial etiology. Benefits of antibiotics include infection resolution and symptom improvement. - Prescribed azithromycin  for bacterial sinusitis. - Prescribed liquid antitussive for nocturnal relief. - Advised continued use of intranasal fluticasone . - Prescribed azelastine  nasal spray twice daily. - Recommended over-the-counter guaifenesin  and hydration. - Advised continued use of vitamin C and zinc  supplements. - Provided work excuse through January 12th. - Instructed to monitor symptoms and contact office if no improvement or worsening by next week.  Chronic obstructive pulmonary disease COPD with increased cough and dyspnea. Compliant with maintenance inhaler and adequate rescue inhaler supply. - Advised to continue Breztri  as maintenance therapy. - Advised to use albuterol  as needed for dyspnea. - Instructed to complete current course of oral corticosteroids. - Instructed to seek care if symptoms worsen or fail to improve.

## 2024-04-29 NOTE — Telephone Encounter (Signed)
 Labs are entirely normal- result note in. She likely has a virus given the time frame of her illness. Symptomatic treatment is best. I'll call her in some cough medicine. If she's not getting better on Monday let me know and I'll call her in an antibiotic, but it's not indicated right now. thanks

## 2024-04-29 NOTE — Progress Notes (Signed)
 "    Primary Care / Sports Medicine Office Visit  Patient Information:  Patient ID: [name], female DOB: 26-Oct-1955 Age: 69 y.o. MRN: [id number]   [name] is a pleasant 69 y.o. female presenting with the following:  Chief Complaint  Patient presents with   Cough    Cough , fever, and sneezing x 1 week. Patient went to PCP on Monday and was tested for flu and covid which came back negative. She was given prednisone  and refill on her nasal spray but she has got worse over the last 2 days her symptoms have got worse.     Vitals:   04/29/24 0858  BP: (!) 120/56  Pulse: 75  Temp: 98.4 F (36.9 C)  SpO2: 93%   Vitals:   04/29/24 0858  Weight: 168 lb (76.2 kg)  Height: 4' 9.5 (1.461 m)   Body mass index is 35.73 kg/m.  No results found.   Discussed the use of AI scribe software for clinical note transcription with the patient, who gave verbal consent to proceed.   Independent interpretation of notes and tests performed by another provider:   None  Procedures performed:   None  Pertinent History, Exam, Impression, and Recommendations:   Problem List Items Addressed This Visit     Acute pansinusitis - Primary   History of Present Illness [name] is a 69 year old female with chronic obstructive pulmonary disease who presents with one week of worsening cough and upper respiratory symptoms.  Cough and respiratory symptoms - One week of progressively worsening cough, sneezing, and dyspnea - Cough is persistent, nocturnal, and productive of green sputum - Significant dyspnea present - No improvement with nearing completion of steroid course, which is atypical for her - Maintenance therapy includes Breztri  and as-needed albuterol   Fever and constitutional symptoms - Fever primarily in the evenings, with a recorded temperature of 101F last night - Afebrile this morning - Mild myalgias present  Sinus and facial pain - Facial pressure and  pain involving the forehead, nasal bridge, and cheeks - No current otologic symptoms - History of ear issue a couple of months ago  Recent infectious disease evaluation and treatments - Evaluated on January 5th and tested negative for influenza and COVID-19 - Current medications include Flonase  nasal spray, Tylenol  Cold, vitamin C, zinc , and vitamin D   Functional status - Continued working despite symptoms  Physical Exam VITALS: SaO2- 93% HEENT: Tympanic membranes dull gray with no posterior fluid. Nasopharynx exhibits significant swelling and erythema. Tenderness in frontal, ethmoid, and maxillary sinuses, maximal at ethmoid. NECK: Bilateral cervical lymphadenopathy at the posterior cervical chain. CHEST: Lungs clear to auscultation bilaterally, no wheezes, rales, or rhonchi. Initial coarse breath sounds in the left lower lobe cleared with coughing. CARDIOVASCULAR: Heart sounds normal.  Results Labs Influenza PCR (04/26/2024): Negative COVID-19 PCR (04/26/2024): Negative Vitamin D  (04/26/2024): Within normal limits  Assessment and Plan Acute bacterial sinusitis Acute bacterial sinusitis with persistent symptoms and purulent sputum, unresponsive to intranasal steroids, indicating bacterial etiology. Benefits of antibiotics include infection resolution and symptom improvement. - Prescribed azithromycin  for bacterial sinusitis. - Prescribed liquid antitussive for nocturnal relief. - Advised continued use of intranasal fluticasone . - Prescribed azelastine  nasal spray twice daily. - Recommended over-the-counter guaifenesin  and hydration. - Advised continued use of vitamin C and zinc  supplements. - Provided work excuse through January 12th. - Instructed to monitor symptoms and contact office if no improvement or worsening by next week.  Chronic obstructive pulmonary disease  COPD with increased cough and dyspnea. Compliant with maintenance inhaler and adequate rescue inhaler supply. -  Advised to continue Breztri  as maintenance therapy. - Advised to use albuterol  as needed for dyspnea. - Instructed to complete current course of oral corticosteroids. - Instructed to seek care if symptoms worsen or fail to improve.      Relevant Medications   azithromycin  (ZITHROMAX ) 250 MG tablet   azelastine  (ASTELIN ) 0.1 % nasal spray   promethazine -dextromethorphan (PROMETHAZINE -DM) 6.25-15 MG/5ML syrup     Orders & Medications Medications:  Meds ordered this encounter  Medications   azithromycin  (ZITHROMAX ) 250 MG tablet    Sig: Take 2 tablets on day 1, then 1 tablet daily on days 2 through 5    Dispense:  6 tablet    Refill:  0   azelastine  (ASTELIN ) 0.1 % nasal spray    Sig: Place 2 sprays into both nostrils 2 (two) times daily. Use in each nostril as directed    Dispense:  30 mL    Refill:  0   promethazine -dextromethorphan (PROMETHAZINE -DM) 6.25-15 MG/5ML syrup    Sig: Take 5 mLs by mouth 4 (four) times daily as needed for cough.    Dispense:  118 mL    Refill:  0   No orders of the defined types were placed in this encounter.    No follow-ups on file.     Julia JINNY Ku, MD, [facility]   Primary Care Sports Medicine Primary Care and Sports Medicine at [facility]   "

## 2024-04-29 NOTE — Patient Instructions (Signed)
 VISIT SUMMARY:  You were seen today for worsening cough and upper respiratory symptoms over the past week. You have been diagnosed with acute bacterial sinusitis and your chronic obstructive pulmonary disease (COPD) was also addressed.  YOUR PLAN:  ACUTE BACTERIAL SINUSITIS: You have a bacterial sinus infection causing persistent symptoms and green sputum. -You have been prescribed azithromycin  to treat the bacterial infection. -Take the liquid antitussive as prescribed for relief from nighttime coughing. -Continue using intranasal fluticasone  as directed. -Use azelastine  nasal spray twice daily. -You can take over-the-counter guaifenesin  and make sure to stay hydrated. -Continue taking vitamin C and zinc  supplements. -You have been given a work excuse through January 12th. -Monitor your symptoms and contact our office if there is no improvement or if your symptoms worsen by next week.  CHRONIC OBSTRUCTIVE PULMONARY DISEASE (COPD): Your COPD symptoms have worsened, with increased cough and difficulty breathing. -Continue using Breztri  as your maintenance therapy. -Use albuterol  as needed for shortness of breath. -Complete your current course of oral corticosteroids. -Seek care if you worsen or do not improve.

## 2024-04-30 ENCOUNTER — Other Ambulatory Visit: Payer: Self-pay

## 2024-04-30 NOTE — Telephone Encounter (Signed)
 Reviewed with patient. She does advise however she was advised to be seen at [facility] and Dr. Alvia started her on antibiotics. She will call back if not feeling better come next week.

## 2024-05-21 ENCOUNTER — Ambulatory Visit (INDEPENDENT_AMBULATORY_CARE_PROVIDER_SITE_OTHER): Admitting: Physician Assistant

## 2024-05-21 ENCOUNTER — Other Ambulatory Visit: Payer: Self-pay | Admitting: Family Medicine

## 2024-05-21 ENCOUNTER — Encounter (INDEPENDENT_AMBULATORY_CARE_PROVIDER_SITE_OTHER): Payer: Self-pay | Admitting: Physician Assistant

## 2024-05-21 VITALS — BP 138/82 | HR 75 | Temp 97.7°F

## 2024-05-21 DIAGNOSIS — H9201 Otalgia, right ear: Secondary | ICD-10-CM

## 2024-05-21 DIAGNOSIS — H9191 Unspecified hearing loss, right ear: Secondary | ICD-10-CM

## 2024-05-25 ENCOUNTER — Telehealth (INDEPENDENT_AMBULATORY_CARE_PROVIDER_SITE_OTHER): Payer: Self-pay | Admitting: Physician Assistant

## 2024-05-25 NOTE — Telephone Encounter (Signed)
 The patient called in to report that Chyrl was to have sent some ear drops into her pharmacy from her appointment with him on 05/21/24 and she has not had anything sent in to her pharmacy.  Please send medication to the [facility] in [place] on [address] and advise patient.

## 2024-05-25 NOTE — Telephone Encounter (Signed)
 Called patient and let her know that PA Chyrl Cohen is out of office and will be back tomorrow. I let her know that I would give her a call back when he returns.

## 2024-05-26 ENCOUNTER — Other Ambulatory Visit: Payer: Self-pay | Admitting: Medical Genetics

## 2024-05-26 DIAGNOSIS — Z006 Encounter for examination for normal comparison and control in clinical research program: Secondary | ICD-10-CM

## 2024-05-26 MED ORDER — CIPROFLOXACIN-DEXAMETHASONE 0.3-0.1 % OT SUSP: 4.0000 [drp] | Freq: Two times a day (BID) | OTIC | 0 refills | Status: AC

## 2024-05-26 NOTE — Telephone Encounter (Signed)
 Thanks

## 2024-05-26 NOTE — Progress Notes (Signed)
 Dear Dr. Vicci, Here is my assessment for our mutual patient, [name]. Thank you for allowing me the opportunity to care for your patient. Please do not hesitate to contact me should you have any other questions. Sincerely, Chyrl Cohen PA-C  Otolaryngology Clinic Note Referring provider: Dr. Vicci HPI:  [name] is a 69 y.o. female kindly referred by Dr. Vicci   Discussed the use of AI scribe software for clinical note transcription with the patient, who gave verbal consent to proceed.  History of Present Illness   [name] is a 69 year old female who presents with acute right otorrhagia.  She was last seen in the office on 12/29/2023.  Below is recap of an encounter.  The patient is a 69 year old female seen in our office for evaluation of ruptured right tympanic membrane.  The patient notes that approximately 4 days ago she was laying in bed, she felt warm fluid out of her right ear, she got up and there was a large amount of blood coming from her right ear.  She notes she went to urgent care who noted a very large tympanic membrane rupture.  On-call ENT was notified, the patient was placed on oral antibiotics and otic drops.  She notes she has been using it since that time.  She notes since that time she has had rather severe hearing loss on the right.  She denies any preceding upper respiratory infections, no pain or any ear related symptoms prior to this.  She denies any use of Q-tips, no trauma to the ear.  Uncertain etiology of why this happened.  She denies any history recurrent ear infections.  No history of the same.    Update 05/21/2024  Two nights prior to presentation, she awoke to spontaneous bleeding from the right ear without identifiable trauma, foreign body insertion, or inciting event. The otorrhagia has been intermittent since onset and is accompanied by right otalgia and decreased hearing. She describes the hearing loss as diminished but not  complete, and notes intermittent popping sounds in the right ear.  She denies use of Q-tips or other objects in the ear and is not taking anticoagulant medications. She recalls a similar episode in September, which was treated with ciprofloxacin /dexamethasone  ear drops. Her hearing was normal prior to the current episode. The otalgia preceded the onset of bleeding and persisted into the following day. She observed a large clot extruding from the ear, and continued bleeding was noted by a coworker the day after onset.  She denies otorrhea or purulent drainage, and has not experienced fever. She reports nasal congestion and uses two nasal sprays, including fluticasone  and another unspecified spray. She denies recent upper respiratory infection symptoms aside from nasal congestion. She is not currently using topical or systemic antibiotics, but previously tolerated ciprofloxacin /dexamethasone  drops well.  She expresses concern regarding the possibility of permanent hearing loss, as the decreased hearing has persisted since the onset of otorrhagia. She experienced a brief episode of dizziness, which she attributes to chair positioning.           Independent Review of Additional Tests or Records:    Audio 02/22/2025  PMH/Meds/All/SocHx/FamHx/ROS:   Past Medical History:  Diagnosis Date   Allergy    Amoxicillin   Asthma    Depression    Diabetes mellitus without complication (HCC)    Hyperlipidemia    Kidney stones    Migraine headache      Past Surgical History:  Procedure Laterality Date  ABDOMINAL HYSTERECTOMY     BREAST CYST ASPIRATION Right 1981   benign   BREAST CYST ASPIRATION Right 1984   benign   CHOLECYSTECTOMY     COLONOSCOPY  March 2016   FLEXIBLE BRONCHOSCOPY N/A 01/26/2018   Procedure: FLEXIBLE BRONCHOSCOPY;  Surgeon: Verdia Art, MD;  Location: [facility] ORS;  Service: Pulmonary;  Laterality: N/A;   MENISCUS REPAIR Left 09/18/2018   OOPHORECTOMY     PARTIAL  KNEE ARTHROPLASTY Left 02/2019    Family History  Problem Relation Age of Onset   Hypertension Mother    Cancer Mother        breast   Arthritis Mother    Alzheimer's disease Mother    Parkinson's disease Mother    Breast cancer Mother 78   Hypertension Father    Diabetes Father    Kidney cancer Father    Kidney disease Father    Breast cancer Cousin 68   Breast cancer Maternal Aunt    Breast cancer Maternal Grandmother 28     Social Connections: Moderately Integrated (04/26/2024)   Social Connection and Isolation Panel    Frequency of Communication with Friends and Family: More than three times a week    Frequency of Social Gatherings with Friends and Family: More than three times a week    Attends Religious Services: More than 4 times per year    Active Member of [employer] or Organizations: Yes    Attends Banker Meetings: 1 to 4 times per year    Marital Status: Divorced     Current Medications[1]   Physical Exam:   BP 138/82   Pulse 75   Temp 97.7 F (36.5 C)   SpO2 96%   Pertinent Findings  CN II-XII grossly intact Right external auditory canal with a thin layer of blood at the cartilaginous bony junction, thin layer of blood over the TM, no perforation noted, no suspicious lesions or drainage, left EAC clear, TM intact well-pneumatized middle ear space Anterior rhinoscopy: Septum midline; bilateral inferior turbinates with no hypertrophy No lesions of oral cavity/oropharynx; dentition WNL No obviously palpable neck masses/lymphadenopathy/thyromegaly No respiratory distress or stridor      Seprately Identifiable Procedures:  None  Impression & Plans:  [name] is a 69 y.o. female with the following   Assessment and Plan    Right ear otorrhagia secondary to canal irritation Recurrent right bleeding and otalgia.  No obvious trauma, foreign body, or tympanic membrane perforation.  There does appear to be some irritation of the bony  cartilaginous junction once some of the blood was cleared, no lesions noted.  Low suspicion for neoplasm; monitoring planned. - Performed suction and manual removal of dried blood from the right external auditory canal. - Applied ciprofloxacin /dexamethasone  otic drops to the right ear. - Instructed her to use ciprofloxacin /dexamethasone  otic drops twice daily for up to two weeks or until finished. - Advised strict avoidance of inserting objects into the ear canal. - Scheduled follow-up in two weeks to reassess healing. - Discussed that persistent or recurrent bleeding or non-healing lesion will prompt advanced imaging (CT or MRI) to exclude neoplasm or other pathology. - Instructed her to contact the office if bleeding becomes uncontrolled or worsens prior to follow-up.  Right ear hearing loss Right-sided hearing loss likely conductive due to blood and debris on the tympanic membrane. Tympanic membrane intact, no effusion or infection. Previous audiograms normal, excluding sensorineural pathology. - Ordered repeat audiogram to assess current hearing and compare with prior results. -  Will reassess hearing at follow-up after completion of otic drops and resolution of canal irritation.        - f/u 2 weeks   Thank you for allowing me the opportunity to care for your patient. Please do not hesitate to contact me should you have any other questions.  Sincerely, Chyrl Cohen PA-C [facility] Phone: [phone] Fax: [phone]  05/26/2024, 9:41 AM         [1]  Current Outpatient Medications:    albuterol  (PROAIR  HFA) 108 (90 Base) MCG/ACT inhaler, Inhale 2 puffs into the lungs every 6 (six) hours as needed for wheezing or shortness of breath., Disp: 8.5 g, Rfl: 6   albuterol  (PROVENTIL ) (2.5 MG/3ML) 0.083% nebulizer solution, USE 1 VIAL VIA NEBULIZER 1 TO 2 TIMES A DAY AS DIRECTED, Disp: 150 mL, Rfl: 1   azelastine  (ASTELIN ) 0.1 % nasal spray, Place 2 sprays into both  nostrils 2 (two) times daily. Use in each nostril as directed, Disp: 30 mL, Rfl: 0   benzonatate  (TESSALON ) 200 MG capsule, Take 1 capsule (200 mg total) by mouth 2 (two) times daily as needed for cough. (Patient not taking: Reported on 04/29/2024), Disp: 20 capsule, Rfl: 0   budesonide -glycopyrrolate-formoterol  (BREZTRI  AEROSPHERE) 160-9-4.8 MCG/ACT AERO inhaler, Inhale 2 puffs into the lungs 2 (two) times daily., Disp: 10.7 g, Rfl: 11   buPROPion  (WELLBUTRIN  SR) 150 MG 12 hr tablet, Take 1 tablet (150 mg total) by mouth 3 (three) times daily., Disp: 270 tablet, Rfl: 1   CELEBREX 200 MG capsule, Take 1 capsule every day by oral route with meal(s)., Disp: , Rfl:    chlorpheniramine-HYDROcodone  (TUSSIONEX) 10-8 MG/5ML, Take 5 mLs by mouth every 12 (twelve) hours as needed for cough., Disp: 70 mL, Rfl: 0   clotrimazole (LOTRIMIN) 1 % cream, Apply 1 Application topically at bedtime as needed., Disp: , Rfl:    diphenoxylate -atropine  (LOMOTIL ) 2.5-0.025 MG tablet, , Disp: , Rfl:    fluticasone  (FLONASE ) 50 MCG/ACT nasal spray, Place 2 sprays into both nostrils 2 (two) times daily., Disp: 54 mL, Rfl: 4   hyoscyamine  (LEVSIN  SL) 0.125 MG SL tablet, TAKE ONE TABLET BY MOUTH EVERY 4 HOURS AS NEEDED, Disp: 540 tablet, Rfl: 1   losartan  (COZAAR ) 25 MG tablet, Take 0.5 tablets (12.5 mg total) by mouth daily., Disp: 45 tablet, Rfl: 1   montelukast  (SINGULAIR ) 10 MG tablet, Take 1 tablet (10 mg total) by mouth at bedtime., Disp: 90 tablet, Rfl: 1   ondansetron  (ZOFRAN ) 8 MG tablet, Take 1 tablet (8 mg total) by mouth every 8 (eight) hours as needed for nausea or vomiting., Disp: 30 tablet, Rfl: 0   ondansetron  (ZOFRAN -ODT) 4 MG disintegrating tablet, Take 4 mg by mouth every 8 (eight) hours as needed., Disp: , Rfl:    phenazopyridine  (PYRIDIUM ) 200 MG tablet, Take 1 tablet (200 mg total) by mouth 3 (three) times daily., Disp: 6 tablet, Rfl: 0   predniSONE  (DELTASONE ) 50 MG tablet, Take 1 tablet (50 mg total) by mouth  daily with breakfast., Disp: 5 tablet, Rfl: 0   promethazine -dextromethorphan (PROMETHAZINE -DM) 6.25-15 MG/5ML syrup, Take 5 mLs by mouth 4 (four) times daily as needed for cough., Disp: 118 mL, Rfl: 0   tirzepatide  (MOUNJARO ) 15 MG/0.5ML Pen, Inject 15 mg into the skin once a week., Disp: 6 mL, Rfl: 1   Vibegron (GEMTESA) 75 MG TABS, Take 1 tablet by mouth daily., Disp: , Rfl:

## 2024-05-26 NOTE — Telephone Encounter (Signed)
 Called patient and let her know that her medication was sent in and that the pharmacy should reach out to her.

## 2024-06-04 ENCOUNTER — Ambulatory Visit (INDEPENDENT_AMBULATORY_CARE_PROVIDER_SITE_OTHER): Admitting: Physician Assistant

## 2024-11-01 ENCOUNTER — Ambulatory Visit: Admitting: Family Medicine
